# Patient Record
Sex: Female | Born: 1955
Health system: Southern US, Community
[De-identification: ages and names within clinical notes are randomized; demographics above are authoritative.]

## PROBLEM LIST (undated history)

## (undated) DIAGNOSIS — I63139 Cerebral infarction due to embolism of unspecified carotid artery: Secondary | ICD-10-CM

## (undated) DIAGNOSIS — I10 Essential (primary) hypertension: Secondary | ICD-10-CM

## (undated) DIAGNOSIS — K219 Gastro-esophageal reflux disease without esophagitis: Secondary | ICD-10-CM

## (undated) DIAGNOSIS — C50919 Malignant neoplasm of unspecified site of unspecified female breast: Secondary | ICD-10-CM

## (undated) DIAGNOSIS — R011 Cardiac murmur, unspecified: Secondary | ICD-10-CM

## (undated) DIAGNOSIS — K759 Inflammatory liver disease, unspecified: Secondary | ICD-10-CM

## (undated) DIAGNOSIS — Z9221 Personal history of antineoplastic chemotherapy: Secondary | ICD-10-CM

## (undated) DIAGNOSIS — Z923 Personal history of irradiation: Secondary | ICD-10-CM

## (undated) DIAGNOSIS — I639 Cerebral infarction, unspecified: Secondary | ICD-10-CM

## (undated) DIAGNOSIS — E785 Hyperlipidemia, unspecified: Secondary | ICD-10-CM

## (undated) DIAGNOSIS — I739 Peripheral vascular disease, unspecified: Secondary | ICD-10-CM

## (undated) DIAGNOSIS — H539 Unspecified visual disturbance: Secondary | ICD-10-CM

## (undated) DIAGNOSIS — E119 Type 2 diabetes mellitus without complications: Secondary | ICD-10-CM

## (undated) DIAGNOSIS — G4730 Sleep apnea, unspecified: Secondary | ICD-10-CM

## (undated) HISTORY — DX: Malignant neoplasm of unspecified site of unspecified female breast: C50.919

## (undated) HISTORY — DX: Unspecified visual disturbance: H53.9

## (undated) HISTORY — PX: COLONOSCOPY: SHX174

## (undated) HISTORY — PX: BREAST EXCISIONAL BIOPSY: SUR124

## (undated) HISTORY — PX: UTERINE FIBROID SURGERY: SHX826

---

## 2010-01-11 ENCOUNTER — Emergency Department (HOSPITAL_COMMUNITY)
Admission: EM | Admit: 2010-01-11 | Discharge: 2010-01-11 | Payer: Self-pay | Source: Home / Self Care | Admitting: Emergency Medicine

## 2011-10-28 ENCOUNTER — Encounter (HOSPITAL_COMMUNITY): Payer: Self-pay | Admitting: Emergency Medicine

## 2011-10-28 ENCOUNTER — Emergency Department (HOSPITAL_COMMUNITY): Payer: Self-pay

## 2011-10-28 ENCOUNTER — Emergency Department (HOSPITAL_COMMUNITY)
Admission: EM | Admit: 2011-10-28 | Discharge: 2011-10-28 | Disposition: A | Discharge status: Home / Self Care | Payer: Self-pay | Attending: Emergency Medicine | Admitting: Emergency Medicine

## 2011-10-28 DIAGNOSIS — R55 Syncope and collapse: Secondary | ICD-10-CM | POA: Insufficient documentation

## 2011-10-28 DIAGNOSIS — R42 Dizziness and giddiness: Secondary | ICD-10-CM | POA: Insufficient documentation

## 2011-10-28 DIAGNOSIS — I10 Essential (primary) hypertension: Secondary | ICD-10-CM | POA: Insufficient documentation

## 2011-10-28 HISTORY — DX: Essential (primary) hypertension: I10

## 2011-10-28 LAB — CBC WITH DIFFERENTIAL/PLATELET
Basophils Absolute: 0 10*3/uL (ref 0.0–0.1)
Basophils Relative: 0 % (ref 0–1)
Eosinophils Absolute: 0 10*3/uL (ref 0.0–0.7)
Eosinophils Relative: 0 % (ref 0–5)
Lymphs Abs: 1 10*3/uL (ref 0.7–4.0)
MCH: 29.8 pg (ref 26.0–34.0)
MCV: 86 fL (ref 78.0–100.0)
Neutrophils Relative %: 76 % (ref 43–77)
Platelets: 303 10*3/uL (ref 150–400)
RBC: 4.43 MIL/uL (ref 3.87–5.11)
RDW: 12.2 % (ref 11.5–15.5)
WBC: 5.5 10*3/uL (ref 4.0–10.5)

## 2011-10-28 LAB — BASIC METABOLIC PANEL
Calcium: 10.2 mg/dL (ref 8.4–10.5)
GFR calc Af Amer: >90 mL/min (ref >90)
GFR calc non Af Amer: >90 mL/min (ref >90)
Glucose, Bld: 147 mg/dL — ABNORMAL HIGH (ref 70–99)
Potassium: 2.9 mEq/L — ABNORMAL LOW (ref 3.5–5.1)
Sodium: 138 mEq/L (ref 135–145)

## 2011-10-28 MED ORDER — SODIUM CHLORIDE 0.9 % IV SOLN
INTRAVENOUS | Status: DC
  Administered 2011-10-28: 13:00:00 via INTRAVENOUS

## 2011-10-28 MED ORDER — POTASSIUM CHLORIDE CRYS ER 20 MEQ PO TBCR
40.0000 meq | EXTENDED_RELEASE_TABLET | Freq: Once | ORAL | Status: AC
  Administered 2011-10-28: 40 meq via ORAL
  Filled 2011-10-28: qty 2

## 2011-10-28 MED ORDER — MECLIZINE HCL 25 MG PO TABS
25.0000 mg | ORAL_TABLET | Freq: Once | ORAL | Status: AC
  Administered 2011-10-28: 25 mg via ORAL
  Filled 2011-10-28: qty 1

## 2011-10-28 MED ORDER — MECLIZINE HCL 25 MG PO TABS: 25.0000 mg | ORAL_TABLET | Freq: Four times a day (QID) | ORAL | Status: DC

## 2011-10-28 NOTE — ED Notes (Signed)
Pt presenting to ed with c/o dizziness, and headache pain. Pt denies nausea and vomiting pt states she feels like her balance is off. Pt states she feels like she's about to pass out. Pt denies chest pain at this time.

## 2011-10-28 NOTE — ED Notes (Addendum)
Wrong chart

## 2011-10-28 NOTE — ED Notes (Signed)
Pt in MRI.

## 2011-10-28 NOTE — ED Provider Notes (Addendum)
History     CSN: [id number]  Arrival date & time 10/28/11  1027   First MD Initiated Contact with Patient 10/28/11 1119      Chief Complaint  Patient presents with  . Dizziness  . Near Syncope    (Consider location/radiation/quality/duration/timing/severity/associated sxs/prior treatment) The history is provided by the patient.   patient here with dizziness worse with standing x1 day. She denies any headache or blurred vision. Denies any chest pain chest pressure. No recent black or bloody stools. Denies abdominal pain. No vertiginous component to this. Did have her blood pressure medications changed recently. Symptoms worse with standing and better with remaining still  Past Medical History  Diagnosis Date  . Hypertension     Past Surgical History  Procedure Date  . Uterine fibroid surgery     No family history on file.  History  Substance Use Topics  . Smoking status: Never Smoker   . Smokeless tobacco: Not on file  . Alcohol Use: No    OB History    Grav Para Term Preterm Abortions TAB SAB Ect Mult Living                  Review of Systems  All other systems reviewed and are negative.    Allergies  Review of patient's allergies indicates no known allergies.  Home Medications   Current Outpatient Rx  Name Route Sig Dispense Refill  . AMLODIPINE BESYLATE 10 MG PO TABS Oral Take 10 mg by mouth daily.    . ASPIRIN 81 MG PO TABS Oral Take 81 mg by mouth daily.    Marland Kitchen HYDROCHLOROTHIAZIDE 50 MG PO TABS Oral Take 50 mg by mouth daily.      BP 172/72  Pulse 88  Temp 98.4 F (36.9 C) (Oral)  SpO2 96%  Physical Exam  Nursing note and vitals reviewed. Constitutional: She is oriented to person, place, and time. She appears well-developed and well-nourished.  Non-toxic appearance. No distress.  HENT:  Head: Normocephalic and atraumatic.  Eyes: Conjunctivae normal, EOM and lids are normal. Pupils are equal, round, and reactive to light.  Neck: Normal range  of motion. Neck supple. No tracheal deviation present. No mass present.  Cardiovascular: Normal rate, regular rhythm and normal heart sounds.  Exam reveals no gallop.   No murmur heard. Pulmonary/Chest: Effort normal and breath sounds normal. No stridor. No respiratory distress. She has no decreased breath sounds. She has no wheezes. She has no rhonchi. She has no rales.  Abdominal: Soft. Normal appearance and bowel sounds are normal. She exhibits no distension. There is no tenderness. There is no rebound and no CVA tenderness.  Musculoskeletal: Normal range of motion. She exhibits no edema and no tenderness.  Neurological: She is alert and oriented to person, place, and time. She has normal strength. No cranial nerve deficit or sensory deficit. GCS eye subscore is 4. GCS verbal subscore is 5. GCS motor subscore is 6.  Skin: Skin is warm and dry. No abrasion and no rash noted.  Psychiatric: She has a normal mood and affect. Her speech is normal and behavior is normal.    ED Course  Procedures (including critical care time)   Labs Reviewed  CBC WITH DIFFERENTIAL  BASIC METABOLIC PANEL   No results found.   No diagnosis found.    MDM   Date: 10/27/2011  Rate: 72  Rhythm: normal sinus rhythm  QRS Axis: normal  Intervals: normal  ST/T Wave abnormalities: nonspecific T wave changes  Conduction Disutrbances:none  Narrative Interpretation:   Old EKG Reviewed: none available  2:09 PM Patient symptoms right now concerning for possible central cause of her vertigo. Patient orthostatic. Will order MRI of her brain. States that when she walks. Balance and leaning to one side.   3:36 PM Pt with pending mri--signed out to dr. Herbert Deaner, MD 10/28/11 1409  Toy Baker, MD 10/28/11 1537

## 2012-08-08 ENCOUNTER — Encounter (HOSPITAL_COMMUNITY): Payer: Self-pay | Admitting: Emergency Medicine

## 2012-08-08 ENCOUNTER — Emergency Department (INDEPENDENT_AMBULATORY_CARE_PROVIDER_SITE_OTHER)
Admission: EM | Admit: 2012-08-08 | Discharge: 2012-08-08 | Disposition: A | Discharge status: Home / Self Care | Payer: Self-pay | Source: Home / Self Care | Attending: Emergency Medicine | Admitting: Emergency Medicine

## 2012-08-08 DIAGNOSIS — W57XXXA Bitten or stung by nonvenomous insect and other nonvenomous arthropods, initial encounter: Secondary | ICD-10-CM

## 2012-08-08 DIAGNOSIS — L0390 Cellulitis, unspecified: Secondary | ICD-10-CM

## 2012-08-08 DIAGNOSIS — L089 Local infection of the skin and subcutaneous tissue, unspecified: Secondary | ICD-10-CM

## 2012-08-08 DIAGNOSIS — L0291 Cutaneous abscess, unspecified: Secondary | ICD-10-CM

## 2012-08-08 MED ORDER — SULFAMETHOXAZOLE-TRIMETHOPRIM 800-160 MG PO TABS: 1.0000 | ORAL_TABLET | Freq: Two times a day (BID) | ORAL | Status: DC

## 2012-08-08 MED ORDER — CEPHALEXIN 500 MG PO CAPS: 500.0000 mg | ORAL_CAPSULE | Freq: Four times a day (QID) | ORAL | Status: DC

## 2012-08-08 NOTE — ED Provider Notes (Signed)
Medical screening examination/treatment/procedure(s) were performed by non-physician practitioner and as supervising physician I was immediately available for consultation/collaboration.  Leslee Home, M.D.  Reuben Likes, MD 08/08/12 [phone]

## 2012-08-08 NOTE — ED Notes (Signed)
Complaining insect bite on left arm and back of neck which happened Wednesday evening.  Patient states the bite was itching so she has been scratching.  Anti itch cream and gold bond used but no relief.

## 2012-08-08 NOTE — ED Provider Notes (Signed)
CSN: [id number]     Arrival date & time 08/08/12  1522 History     First MD Initiated Contact with Patient 08/08/12 1613     Chief Complaint  Patient presents with  . Insect Bite   (Consider location/radiation/quality/duration/timing/severity/associated sxs/prior Treatment) HPI Comments: Pt noticed insect bites on back of neck and L forearm on 7/23, not sure of kind of insect.  Both areas have become swollen.  L forearm is much more swollen, red, hot to touch.   Patient is a 57 y.o. female presenting with rash. The history is provided by the patient.  Rash Pain quality: aching   Pain severity:  Moderate Onset quality:  Gradual Timing:  Constant Progression:  Worsening Chronicity:  New Context comment:  Insect bite Relieved by:  Nothing Ineffective treatments: anti itch cream and gold bond. Associated symptoms: no chills and no fever     Past Medical History  Diagnosis Date  . Hypertension    Past Surgical History  Procedure Laterality Date  . Uterine fibroid surgery     History reviewed. No pertinent family history. History  Substance Use Topics  . Smoking status: Never Smoker   . Smokeless tobacco: Not on file  . Alcohol Use: No   OB History   Grav Para Term Preterm Abortions TAB SAB Ect Mult Living                 Review of Systems  Constitutional: Negative for fever and chills.  Skin: Positive for rash.    Allergies  Review of patient's allergies indicates no known allergies.  Home Medications   Current Outpatient Rx  Name  Route  Sig  Dispense  Refill  . amLODipine (NORVASC) 10 MG tablet   Oral   Take 10 mg by mouth daily.         Marland Kitchen aspirin 81 MG tablet   Oral   Take 81 mg by mouth daily.         . hydrochlorothiazide (HYDRODIURIL) 50 MG tablet   Oral   Take 50 mg by mouth daily.         . cephALEXin (KEFLEX) 500 MG capsule   Oral   Take 1 capsule (500 mg total) by mouth 4 (four) times daily.   40 capsule   0   . meclizine  (ANTIVERT) 25 MG tablet   Oral   Take 1 tablet (25 mg total) by mouth 4 (four) times daily.   28 tablet   0   . sulfamethoxazole-trimethoprim (SEPTRA DS) 800-160 MG per tablet   Oral   Take 1 tablet by mouth every 12 (twelve) hours.   20 tablet   0    BP 162/86  Pulse 89  Temp(Src) 98.1 F (36.7 C) (Oral)  Resp 20  SpO2 98% Physical Exam  Constitutional: She appears well-developed and well-nourished. No distress.  Pulmonary/Chest: Effort normal.  Skin: Skin is warm, dry and intact. Rash noted. There is erythema.       ED Course   Procedures (including critical care time)  Labs Reviewed - No data to display No results found. 1. Cellulitis   2. Insect bite of arm, left, infected, initial encounter   3. Insect bite     MDM  rx for bactrim and keflex given for 10 days each.  Pt to return here for recheck on Sunday 7/27 unless sx get significantly worse in which case pt is to go to ER.   Cathlyn Parsons, NP 08/08/12 [phone]

## 2014-08-16 ENCOUNTER — Emergency Department (HOSPITAL_COMMUNITY)
Admission: EM | Admit: 2014-08-16 | Discharge: 2014-08-16 | Disposition: A | Discharge status: Home / Self Care | Payer: No Typology Code available for payment source | Source: Home / Self Care | Attending: Family Medicine | Admitting: Family Medicine

## 2014-08-16 ENCOUNTER — Encounter (HOSPITAL_COMMUNITY): Payer: Self-pay | Admitting: Emergency Medicine

## 2014-08-16 DIAGNOSIS — W57XXXA Bitten or stung by nonvenomous insect and other nonvenomous arthropods, initial encounter: Secondary | ICD-10-CM

## 2014-08-16 DIAGNOSIS — S60861A Insect bite (nonvenomous) of right wrist, initial encounter: Secondary | ICD-10-CM

## 2014-08-16 MED ORDER — BETAMETHASONE DIPROPIONATE 0.05 % EX CREA: TOPICAL_CREAM | Freq: Two times a day (BID) | CUTANEOUS | Status: DC

## 2014-08-16 NOTE — ED Provider Notes (Signed)
CSN: [id number]     Arrival date & time 08/16/14  1308 History   First MD Initiated Contact with Patient 08/16/14 1507     Chief Complaint  Patient presents with  . Rash  . Insect Bite   (Consider location/radiation/quality/duration/timing/severity/associated sxs/prior Treatment) Patient is a 59 y.o. female presenting with rash. The history is provided by the patient.  Rash Location:  Shoulder/arm Shoulder/arm rash location:  R forearm Quality: blistering, itchiness and swelling   Severity:  Mild Onset quality:  Gradual Duration:  2 days Progression:  Unchanged Chronicity:  New Context: insect bite/sting   Relieved by:  None tried Worsened by:  Nothing tried Associated symptoms: no fever     Past Medical History  Diagnosis Date  . Hypertension    Past Surgical History  Procedure Laterality Date  . Uterine fibroid surgery     No family history on file. History  Substance Use Topics  . Smoking status: Never Smoker   . Smokeless tobacco: Not on file  . Alcohol Use: No   OB History    No data available     Review of Systems  Constitutional: Negative.  Negative for fever.  Skin: Positive for rash.    Allergies  Review of patient's allergies indicates no known allergies.  Home Medications   Prior to Admission medications   Medication Sig Start Date End Date Taking? Authorizing Provider  amLODipine (NORVASC) 10 MG tablet Take 10 mg by mouth daily.   Yes Historical Provider, MD  aspirin 81 MG tablet Take 81 mg by mouth daily.   Yes Historical Provider, MD  hydrochlorothiazide (HYDRODIURIL) 50 MG tablet Take 50 mg by mouth daily.   Yes Historical Provider, MD  pravastatin (PRAVACHOL) 20 MG tablet Take 20 mg by mouth daily.   Yes Historical Provider, MD  cephALEXin (KEFLEX) 500 MG capsule Take 1 capsule (500 mg total) by mouth 4 (four) times daily. 08/08/12   Carvel Getting, NP  meclizine (ANTIVERT) 25 MG tablet Take 1 tablet (25 mg total) by mouth 4 (four) times  daily. 10/28/11   Lacretia Leigh, MD  sulfamethoxazole-trimethoprim (SEPTRA DS) 800-160 MG per tablet Take 1 tablet by mouth every 12 (twelve) hours. 08/08/12   Carvel Getting, NP   BP 158/63 mmHg  Pulse 74  Temp(Src) 98.3 F (36.8 C) (Oral)  Resp 16  SpO2 98% Physical Exam  Constitutional: She is oriented to person, place, and time. She appears well-developed and well-nourished.  Musculoskeletal: She exhibits no tenderness.  Neurological: She is alert and oriented to person, place, and time.  Skin: Skin is warm and dry. Rash noted. There is erythema.  Nursing note and vitals reviewed.   ED Course  Procedures (including critical care time) Labs Review Labs Reviewed - No data to display  Imaging Review No results found.   MDM  No diagnosis found.     Billy Fischer, MD 08/16/14 [phone]

## 2014-08-16 NOTE — ED Notes (Signed)
Reports poss insect bite to right forearm onset Saturday and now has developed a rash Did not see or feel what bit her  Denies fevers, chills... Alert, no signs of acute distress.

## 2015-08-31 ENCOUNTER — Ambulatory Visit
Admission: RE | Admit: 2015-08-31 | Discharge: 2015-08-31 | Disposition: A | Discharge status: Home / Self Care | Payer: BLUE CROSS/BLUE SHIELD | Source: Ambulatory Visit | Attending: Family Medicine | Admitting: Family Medicine

## 2015-08-31 ENCOUNTER — Other Ambulatory Visit: Payer: Self-pay | Admitting: Family Medicine

## 2015-08-31 DIAGNOSIS — M79672 Pain in left foot: Secondary | ICD-10-CM

## 2016-05-07 ENCOUNTER — Other Ambulatory Visit: Payer: Self-pay | Admitting: Family Medicine

## 2016-05-07 DIAGNOSIS — R102 Pelvic and perineal pain: Secondary | ICD-10-CM

## 2016-05-08 ENCOUNTER — Ambulatory Visit
Admission: RE | Admit: 2016-05-08 | Discharge: 2016-05-08 | Disposition: A | Discharge status: Home / Self Care | Payer: BLUE CROSS/BLUE SHIELD | Source: Ambulatory Visit | Attending: Family Medicine | Admitting: Family Medicine

## 2016-05-08 DIAGNOSIS — R102 Pelvic and perineal pain: Secondary | ICD-10-CM

## 2017-07-05 ENCOUNTER — Emergency Department (HOSPITAL_COMMUNITY): Payer: Medicaid Other

## 2017-07-05 ENCOUNTER — Encounter (HOSPITAL_COMMUNITY): Payer: Self-pay | Admitting: Radiology

## 2017-07-05 ENCOUNTER — Inpatient Hospital Stay (HOSPITAL_COMMUNITY)
Admission: EM | Admit: 2017-07-05 | Discharge: 2017-07-13 | DRG: 871 | Disposition: A | Discharge status: Home Health | Payer: Medicaid Other | Attending: Internal Medicine | Admitting: Internal Medicine

## 2017-07-05 DIAGNOSIS — R6520 Severe sepsis without septic shock: Secondary | ICD-10-CM | POA: Diagnosis present

## 2017-07-05 DIAGNOSIS — E785 Hyperlipidemia, unspecified: Secondary | ICD-10-CM | POA: Diagnosis present

## 2017-07-05 DIAGNOSIS — R29702 NIHSS score 2: Secondary | ICD-10-CM | POA: Diagnosis not present

## 2017-07-05 DIAGNOSIS — I6330 Cerebral infarction due to thrombosis of unspecified cerebral artery: Secondary | ICD-10-CM

## 2017-07-05 DIAGNOSIS — E860 Dehydration: Secondary | ICD-10-CM | POA: Diagnosis present

## 2017-07-05 DIAGNOSIS — K047 Periapical abscess without sinus: Secondary | ICD-10-CM | POA: Diagnosis present

## 2017-07-05 DIAGNOSIS — G9341 Metabolic encephalopathy: Secondary | ICD-10-CM | POA: Diagnosis present

## 2017-07-05 DIAGNOSIS — E870 Hyperosmolality and hypernatremia: Secondary | ICD-10-CM | POA: Diagnosis present

## 2017-07-05 DIAGNOSIS — E872 Acidosis, unspecified: Secondary | ICD-10-CM

## 2017-07-05 DIAGNOSIS — Z452 Encounter for adjustment and management of vascular access device: Secondary | ICD-10-CM

## 2017-07-05 DIAGNOSIS — G8321 Monoplegia of upper limb affecting right dominant side: Secondary | ICD-10-CM | POA: Diagnosis not present

## 2017-07-05 DIAGNOSIS — E1122 Type 2 diabetes mellitus with diabetic chronic kidney disease: Secondary | ICD-10-CM | POA: Diagnosis present

## 2017-07-05 DIAGNOSIS — Z833 Family history of diabetes mellitus: Secondary | ICD-10-CM

## 2017-07-05 DIAGNOSIS — I63412 Cerebral infarction due to embolism of left middle cerebral artery: Secondary | ICD-10-CM | POA: Diagnosis not present

## 2017-07-05 DIAGNOSIS — E1101 Type 2 diabetes mellitus with hyperosmolarity with coma: Secondary | ICD-10-CM | POA: Diagnosis present

## 2017-07-05 DIAGNOSIS — E1110 Type 2 diabetes mellitus with ketoacidosis without coma: Secondary | ICD-10-CM | POA: Diagnosis present

## 2017-07-05 DIAGNOSIS — D696 Thrombocytopenia, unspecified: Secondary | ICD-10-CM | POA: Diagnosis present

## 2017-07-05 DIAGNOSIS — N182 Chronic kidney disease, stage 2 (mild): Secondary | ICD-10-CM | POA: Diagnosis present

## 2017-07-05 DIAGNOSIS — K0530 Chronic periodontitis, unspecified: Secondary | ICD-10-CM | POA: Diagnosis present

## 2017-07-05 DIAGNOSIS — A419 Sepsis, unspecified organism: Principal | ICD-10-CM | POA: Diagnosis present

## 2017-07-05 DIAGNOSIS — E1111 Type 2 diabetes mellitus with ketoacidosis with coma: Secondary | ICD-10-CM | POA: Diagnosis present

## 2017-07-05 DIAGNOSIS — R9431 Abnormal electrocardiogram [ECG] [EKG]: Secondary | ICD-10-CM

## 2017-07-05 DIAGNOSIS — I6522 Occlusion and stenosis of left carotid artery: Secondary | ICD-10-CM | POA: Diagnosis present

## 2017-07-05 DIAGNOSIS — E876 Hypokalemia: Secondary | ICD-10-CM

## 2017-07-05 DIAGNOSIS — R29810 Facial weakness: Secondary | ICD-10-CM | POA: Diagnosis not present

## 2017-07-05 DIAGNOSIS — Z823 Family history of stroke: Secondary | ICD-10-CM

## 2017-07-05 DIAGNOSIS — E1011 Type 1 diabetes mellitus with ketoacidosis with coma: Secondary | ICD-10-CM

## 2017-07-05 DIAGNOSIS — N179 Acute kidney failure, unspecified: Secondary | ICD-10-CM | POA: Diagnosis present

## 2017-07-05 DIAGNOSIS — E878 Other disorders of electrolyte and fluid balance, not elsewhere classified: Secondary | ICD-10-CM | POA: Diagnosis present

## 2017-07-05 DIAGNOSIS — E861 Hypovolemia: Secondary | ICD-10-CM | POA: Diagnosis present

## 2017-07-05 DIAGNOSIS — I129 Hypertensive chronic kidney disease with stage 1 through stage 4 chronic kidney disease, or unspecified chronic kidney disease: Secondary | ICD-10-CM | POA: Diagnosis present

## 2017-07-05 DIAGNOSIS — Z9181 History of falling: Secondary | ICD-10-CM

## 2017-07-05 DIAGNOSIS — K0401 Reversible pulpitis: Secondary | ICD-10-CM

## 2017-07-05 DIAGNOSIS — Z7982 Long term (current) use of aspirin: Secondary | ICD-10-CM

## 2017-07-05 LAB — CBC WITH DIFFERENTIAL/PLATELET
ABS IMMATURE GRANULOCYTES: 0.3 10*3/uL — AB (ref 0.0–0.1)
Basophils Absolute: 0.1 10*3/uL (ref 0.0–0.1)
Basophils Relative: 0 %
EOS ABS: 0 10*3/uL (ref 0.0–0.7)
Eosinophils Relative: 0 %
HEMATOCRIT: 64.5 % — AB (ref 36.0–46.0)
Hemoglobin: 18.4 g/dL — ABNORMAL HIGH (ref 12.0–15.0)
IMMATURE GRANULOCYTES: 2 %
LYMPHS ABS: 1 10*3/uL (ref 0.7–4.0)
Lymphocytes Relative: 6 %
MCH: 28.5 pg (ref 26.0–34.0)
MCHC: 28.5 g/dL — ABNORMAL LOW (ref 30.0–36.0)
MCV: 100 fL (ref 78.0–100.0)
MONOS PCT: 2 %
Monocytes Absolute: 0.4 10*3/uL (ref 0.1–1.0)
NEUTROS ABS: 16.3 10*3/uL — AB (ref 1.7–7.7)
Neutrophils Relative %: 90 %
Platelets: 329 10*3/uL (ref 150–400)
RBC: 6.45 MIL/uL — AB (ref 3.87–5.11)
RDW: 13.8 % (ref 11.5–15.5)
WBC: 18.2 10*3/uL — AB (ref 4.0–10.5)

## 2017-07-05 LAB — I-STAT CHEM 8, ED
BUN: 92 mg/dL — ABNORMAL HIGH (ref 6–20)
CALCIUM ION: 1.28 mmol/L (ref 1.15–1.40)
CHLORIDE: 114 mmol/L — AB (ref 101–111)
Creatinine, Ser: 4.6 mg/dL — ABNORMAL HIGH (ref 0.44–1.00)
HCT: 58 % — ABNORMAL HIGH (ref 36.0–46.0)
Hemoglobin: 19.7 g/dL — ABNORMAL HIGH (ref 12.0–15.0)
Potassium: 3.2 mmol/L — ABNORMAL LOW (ref 3.5–5.1)
SODIUM: 149 mmol/L — AB (ref 135–145)
TCO2: 11 mmol/L — AB (ref 22–32)

## 2017-07-05 LAB — SALICYLATE LEVEL: Salicylate Lvl: <7 mg/dL (ref 2.8–30.0)

## 2017-07-05 LAB — I-STAT VENOUS BLOOD GAS, ED
Acid-base deficit: 19 mmol/L — ABNORMAL HIGH (ref 0.0–2.0)
Bicarbonate: 10.2 mmol/L — ABNORMAL LOW (ref 20.0–28.0)
O2 SAT: 54 %
PCO2 VEN: 35.9 mmHg — AB (ref 44.0–60.0)
PO2 VEN: 40 mmHg (ref 32.0–45.0)
TCO2: 11 mmol/L — AB (ref 22–32)
pH, Ven: 7.061 — CL (ref 7.250–7.430)

## 2017-07-05 LAB — PROTIME-INR
INR: 1.23
Prothrombin Time: 15.4 seconds — ABNORMAL HIGH (ref 11.4–15.2)

## 2017-07-05 LAB — I-STAT CG4 LACTIC ACID, ED: LACTIC ACID, VENOUS: 10.19 mmol/L — AB (ref 0.5–1.9)

## 2017-07-05 LAB — CBG MONITORING, ED

## 2017-07-05 LAB — ETHANOL: Alcohol, Ethyl (B): <10 mg/dL (ref <10)

## 2017-07-05 LAB — ACETAMINOPHEN LEVEL: Acetaminophen (Tylenol), Serum: <10 ug/mL — ABNORMAL LOW (ref 10–30)

## 2017-07-05 MED ORDER — SODIUM CHLORIDE 0.9 % IV BOLUS
1000.0000 mL | Freq: Once | INTRAVENOUS | Status: AC
  Administered 2017-07-05: 1000 mL via INTRAVENOUS

## 2017-07-05 MED ORDER — SODIUM CHLORIDE 0.9 % IV SOLN
INTRAVENOUS | Status: DC
  Administered 2017-07-05: 5.4 [IU]/h via INTRAVENOUS
  Administered 2017-07-06: 16.6 [IU]/h via INTRAVENOUS
  Filled 2017-07-05 (×3): qty 1

## 2017-07-05 MED ORDER — SODIUM CHLORIDE 0.9 % IV SOLN
INTRAVENOUS | Status: DC
  Administered 2017-07-05: 23:00:00 via INTRAVENOUS

## 2017-07-05 MED ORDER — DEXTROSE-NACL 5-0.45 % IV SOLN
INTRAVENOUS | Status: DC
  Administered 2017-07-06 – 2017-07-08 (×7): via INTRAVENOUS

## 2017-07-05 MED ORDER — MAGNESIUM SULFATE IN D5W 1-5 GM/100ML-% IV SOLN
1.0000 g | Freq: Once | INTRAVENOUS | Status: AC
  Administered 2017-07-06: 1 g via INTRAVENOUS
  Filled 2017-07-05: qty 100

## 2017-07-05 MED ORDER — NALOXONE HCL 4 MG/0.1ML NA LIQD
1.0000 | Freq: Once | NASAL | Status: AC
  Administered 2017-07-05: 1 via NASAL
  Filled 2017-07-05: qty 4

## 2017-07-05 MED ORDER — POTASSIUM CHLORIDE 10 MEQ/100ML IV SOLN
10.0000 meq | INTRAVENOUS | Status: AC
  Administered 2017-07-05 – 2017-07-06 (×4): 10 meq via INTRAVENOUS
  Filled 2017-07-05 (×4): qty 100

## 2017-07-05 NOTE — ED Notes (Addendum)
Pt found in bed with a fan pointed toward her by family, she was not answering the phone today per family. LSW 1000 today. Recent dental procedure but unknown if she is taking medicines. EMS reports hx HTN. Pt responsive to painful stimuli upon arrival to ED, fruity odor to breath, mucous membranes dry. Cool to the touch.

## 2017-07-05 NOTE — ED Notes (Signed)
Escorted to CT. Pt remains alerted, moving around in bed asking to get up.

## 2017-07-05 NOTE — ED Provider Notes (Signed)
The patient is a 62 year old female, there is not much history as the patient arrives obtunded, she apparently has had altered mental status today, she has severely dry mucous membranes, she is able to follow commands barely but has difficulty opening her eyes, has difficulty speaking at all, she is tachycardic and hypotensive with a blood pressure of 76 and a blood sugar which is too high to read over 600.  On exam the patient has no vascular access peripherally and an ultrasound IV was required.  She has no edema, soft abdomen, no pulsating masses, clear lung sounds, the patient is tachypneic.  The patient is critically ill, she is hyperventilating with shallow respirations but adequately oxygenating.  Her IV access was very difficult, required ultrasound-guided IVs in the bilateral antecubital areas.  She has a mild tachycardia, she is hypotensive requiring multiple fluid boluses.  She will likely need to go to a high level of care if not the intensive care unit.  Labs pending at this time.  10:47 PM   Labs are significantly abnormal, she is acidotic, she has a significant lactic acidosis as well as a ketoacidosis, blood sugars over 1200, the patient is received multiple fluid boluses, blood pressure is improving, her mental status is also improving, I discussed the care with her family at the bedside, she will need to be admitted to the hospital to a high level of care on an insulin drip.    .Critical Care Performed by: Noemi Chapel, MD Authorized by: Noemi Chapel, MD   Critical care provider statement:    Critical care time (minutes):  35   Critical care time was exclusive of:  Separately billable procedures and treating other patients and teaching time   Critical care was necessary to treat or prevent imminent or life-threatening deterioration of the following conditions:  Endocrine crisis and shock   Critical care was time spent personally by me on the following activities:  Blood draw  for specimens, development of treatment plan with patient or surrogate, discussions with consultants, evaluation of patient's response to treatment, examination of patient, obtaining history from patient or surrogate, ordering and performing treatments and interventions, ordering and review of laboratory studies, ordering and review of radiographic studies, pulse oximetry, re-evaluation of patient's condition and review of old charts   I saw and evaluated the patient, reviewed the resident's note and I agree with the findings and plan.   EKG Interpretation  Date/Time:  Friday July 05 2017 23:13:16 EDT Ventricular Rate:  89 PR Interval:    QRS Duration: 84 QT Interval:  424 QTC Calculation: 516 R Axis:   63 Text Interpretation:  Sinus rhythm RSR' in V1 or V2, right VCD or RVH Repol abnrm suggests ischemia, diffuse leads Prolonged QT interval Since last tracing QT has lengthened Abnormal ekg Confirmed by Noemi Chapel [id number] on 07/06/2017 12:10:28 AM        I personally interpreted the EKG as well as the resident and agree with the interpretation on the resident's chart.  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

## 2017-07-05 NOTE — ED Provider Notes (Signed)
[facility] Provider Note   CSN: [id number] Arrival date & time: 07/05/17  2210  History   Chief Complaint Chief Complaint  Patient presents with  . Altered Mental Status  . Hypotension    HPI Sandra Shelton is a 62 y.o. female.  Not responding to family members calls all day. Talked to a sister in [place] over the phone just a couple hours prior to arrival, and told sister that she didn't feel okay and could not get out of bed. Police were called to patient's home, where she was found obtunded. Family reports recent fatigue, weight loss, polydipsia. No h/o DM. BG per EMS read "high."  The history is provided by the EMS personnel and a relative. The history is limited by the condition of the patient.  Altered Mental Status   This is a new problem. Episode onset: talked to family on phone 2 hours prior to arrival, last seen normal a few days ago. The problem has been gradually worsening. Associated symptoms include confusion, unresponsiveness and weakness. Her past medical history is significant for hypertension.    Past Medical History:  Diagnosis Date  . Hypertension     Patient Active Problem List   Diagnosis Date Noted  . DKA, type 2 ([place]) 07/06/2017    Past Surgical History:  Procedure Laterality Date  . UTERINE FIBROID SURGERY       OB History   None      Home Medications    Prior to Admission medications   Medication Sig Start Date End Date Taking? Authorizing Provider  amLODipine (NORVASC) 10 MG tablet Take 10 mg by mouth daily.    [provider]  aspirin 81 MG tablet Take 81 mg by mouth daily.    [provider]  betamethasone dipropionate (DIPROLENE) 0.05 % cream Apply topically 2 (two) times daily. 08/16/14   Billy Fischer, MD  cephALEXin (KEFLEX) 500 MG capsule Take 1 capsule (500 mg total) by mouth 4 (four) times daily. 08/08/12   Carvel Getting, NP  hydrochlorothiazide (HYDRODIURIL) 50 MG  tablet Take 50 mg by mouth daily.    [provider]  meclizine (ANTIVERT) 25 MG tablet Take 1 tablet (25 mg total) by mouth 4 (four) times daily. 10/28/11   Lacretia Leigh, MD  pravastatin (PRAVACHOL) 20 MG tablet Take 20 mg by mouth daily.    [provider]  sulfamethoxazole-trimethoprim (SEPTRA DS) 800-160 MG per tablet Take 1 tablet by mouth every 12 (twelve) hours. 08/08/12   Carvel Getting, NP    Family History Family History  Problem Relation Age of Onset  . Diabetes Mellitus II Sister     Social History Social History   Tobacco Use  . Smoking status: Never Smoker  . Smokeless tobacco: Never Used  Substance Use Topics  . Alcohol use: No  . Drug use: No     Allergies   Patient has no known allergies.   Review of Systems Review of Systems  Unable to perform ROS: Mental status change  Neurological: Positive for weakness.  Psychiatric/Behavioral: Positive for confusion.     Physical Exam Updated Vital Signs BP (!) 129/59   Pulse 97   Temp 98.5 F (36.9 C) (Rectal)   Resp (!) 30   Ht 5\' 6"  (1.676 m)   Wt 77.1 kg (170 lb)   SpO2 100%   BMI 27.44 kg/m   Physical Exam  Constitutional: She appears well-developed. She appears lethargic. She is easily aroused.  She appears ill. No distress.  HENT:  Head: Normocephalic and atraumatic.  Mouth/Throat: Mucous membranes are dry.  Eyes: Conjunctivae and EOM are normal.  Neck: Neck supple.  Cardiovascular: Regular rhythm and intact distal pulses. Tachycardia present.  No murmur heard. Pulmonary/Chest: Breath sounds normal. Tachypnea noted. No respiratory distress.  Abdominal: Soft. She exhibits no distension and no mass.  Musculoskeletal: She exhibits no edema.  Neurological: She is easily aroused. She appears lethargic. GCS eye subscore is 3. GCS verbal subscore is 2. GCS motor subscore is 6.  Skin: Skin is warm and dry.  Nursing note and vitals reviewed.    ED Treatments / Results   Labs (all labs ordered are listed, but only abnormal results are displayed) Labs Reviewed  CBC WITH DIFFERENTIAL/PLATELET - Abnormal; Notable for the following components:      Result Value   WBC 18.2 (*)    RBC 6.45 (*)    Hemoglobin 18.4 (*)    HCT 64.5 (*)    MCHC 28.5 (*)    Neutro Abs 16.3 (*)    Abs Immature Granulocytes 0.3 (*)    All other components within normal limits  COMPREHENSIVE METABOLIC PANEL - Abnormal; Notable for the following components:   Sodium 149 (*)    Potassium 3.2 (*)    CO2 10 (*)    Glucose, Bld 1,265 (*)    BUN 103 (*)    Creatinine, Ser 5.31 (*)    Calcium 11.3 (*)    Total Bilirubin 1.4 (*)    GFR calc non Af Amer 8 (*)    GFR calc Af Amer 9 (*)    Anion gap 37 (*)    All other components within normal limits  ACETAMINOPHEN LEVEL - Abnormal; Notable for the following components:   Acetaminophen (Tylenol), Serum <10 (*)    All other components within normal limits  PROTIME-INR - Abnormal; Notable for the following components:   Prothrombin Time 15.4 (*)    All other components within normal limits  RAPID URINE DRUG SCREEN, HOSP PERFORMED - Abnormal; Notable for the following components:   Barbiturates   (*)    Value: Result not available. Reagent lot number recalled by manufacturer.   All other components within normal limits  URINALYSIS, ROUTINE W REFLEX MICROSCOPIC - Abnormal; Notable for the following components:   APPearance CLOUDY (*)    Glucose, UA >=500 (*)    Hgb urine dipstick MODERATE (*)    Ketones, ur 20 (*)    Protein, ur 100 (*)    Bacteria, UA RARE (*)    Non Squamous Epithelial 0-5 (*)    All other components within normal limits  BETA-HYDROXYBUTYRIC ACID - Abnormal; Notable for the following components:   Beta-Hydroxybutyric Acid >8.00 (*)    All other components within normal limits  CBG MONITORING, ED - Abnormal; Notable for the following components:   Glucose-Capillary >600 (*)    All other components within  normal limits  I-STAT CHEM 8, ED - Abnormal; Notable for the following components:   Sodium 149 (*)    Potassium 3.2 (*)    Chloride 114 (*)    BUN 92 (*)    Creatinine, Ser 4.60 (*)    Glucose, Bld >700 (*)    TCO2 11 (*)    Hemoglobin 19.7 (*)    HCT 58.0 (*)    All other components within normal limits  I-STAT CG4 LACTIC ACID, ED - Abnormal; Notable for the following components:   Lactic  Acid, Venous 10.19 (*)    All other components within normal limits  I-STAT CG4 LACTIC ACID, ED - Abnormal; Notable for the following components:   Lactic Acid, Venous 8.01 (*)    All other components within normal limits  I-STAT VENOUS BLOOD GAS, ED - Abnormal; Notable for the following components:   pH, Ven 7.061 (*)    pCO2, Ven 35.9 (*)    Bicarbonate 10.2 (*)    TCO2 11 (*)    Acid-base deficit 19.0 (*)    All other components within normal limits  I-STAT CHEM 8, ED - Abnormal; Notable for the following components:   Sodium 154 (*)    Chloride 122 (*)    BUN 83 (*)    Creatinine, Ser 3.90 (*)    Glucose, Bld >700 (*)    TCO2 12 (*)    Hemoglobin 17.3 (*)    HCT 51.0 (*)    All other components within normal limits  CBG MONITORING, ED - Abnormal; Notable for the following components:   Glucose-Capillary >600 (*)    All other components within normal limits  CULTURE, BLOOD (ROUTINE X 2)  CULTURE, BLOOD (ROUTINE X 2)  URINE CULTURE  ETHANOL  SALICYLATE LEVEL  BLOOD GAS, VENOUS  TROPONIN I  TROPONIN I  TROPONIN I  BLOOD GAS, ARTERIAL  CBG MONITORING, ED  I-STAT CG4 LACTIC ACID, ED  I-STAT CG4 LACTIC ACID, ED  I-STAT CHEM 8, ED  CBG MONITORING, ED    EKG EKG Interpretation  Date/Time:  Friday July 05 2017 23:13:16 EDT Ventricular Rate:  89 PR Interval:    QRS Duration: 84 QT Interval:  424 QTC Calculation: 516 R Axis:   63 Text Interpretation:  Sinus rhythm RSR' in V1 or V2, right VCD or RVH Repol abnrm suggests ischemia, diffuse leads Prolonged QT interval Since  last tracing QT has lengthened Abnormal ekg Confirmed by Noemi Chapel [phone]) on 07/06/2017 12:10:28 AM   Radiology Ct Head Wo Contrast  Result Date: 07/06/2017 CLINICAL DATA:  Altered level of consciousness. Patient was found in bed. Responsive to painful stimuli. EXAM: CT HEAD WITHOUT CONTRAST TECHNIQUE: Contiguous axial images were obtained from the base of the skull through the vertex without intravenous contrast. COMPARISON:  MRI brain 10/28/2011 FINDINGS: Brain: Mild cerebral atrophy. No evidence of acute infarction, hemorrhage, hydrocephalus, extra-axial collection or mass lesion/mass effect. Vascular: Mild intracranial arterial vascular calcifications are present. Skull: Normal. Negative for fracture or focal lesion. Sinuses/Orbits: No acute finding. Other: None. IMPRESSION: No acute intracranial abnormalities.  Mild chronic atrophy. Electronically Signed   By: Lucienne Capers M.D.   On: 07/06/2017 00:48   Dg Chest Portable 1 View  Result Date: 07/05/2017 CLINICAL DATA:  62 year old female with altered mental status today. EXAM: PORTABLE CHEST 1 VIEW COMPARISON:  None. FINDINGS: Portable AP supine view at 2259 hours. Rotated to the right. Mildly low lung volumes. Allowing for portable technique the lungs are clear. Normal cardiac size and mediastinal contours. Visualized tracheal air column is within normal limits. No pneumothorax. Paucity of bowel gas in the upper abdomen. No acute osseous abnormality identified. IMPRESSION: No acute cardiopulmonary abnormality. Electronically Signed   By: Genevie Ann M.D.   On: 07/05/2017 23:35    Procedures Procedures (including critical care time)  Medications Ordered in ED Medications  0.9 %  sodium chloride infusion ( Intravenous New Bag/Given 07/05/17 2326)  dextrose 5 %-0.45 % sodium chloride infusion (has no administration in time range)  potassium chloride 10 mEq in  100 mL IVPB (10 mEq Intravenous New Bag/Given 07/06/17 0022)  insulin regular  (NOVOLIN R,HUMULIN R) 100 Units in sodium chloride 0.9 % 100 mL (1 Units/mL) infusion (10.8 Units/hr Intravenous Rate/Dose Change 07/06/17 0035)  sodium chloride 0.9 % bolus 1,000 mL (1,000 mLs Intravenous New Bag/Given 07/06/17 0021)  naloxone (NARCAN) nasal spray 4 mg/0.1 mL (1 spray Nasal Provided for home use 07/05/17 2255)  sodium chloride 0.9 % bolus 1,000 mL (0 mLs Intravenous Stopped 07/05/17 2329)  sodium chloride 0.9 % bolus 1,000 mL (0 mLs Intravenous Stopped 07/05/17 2330)  magnesium sulfate IVPB 1 g 100 mL (0 g Intravenous Stopped 07/06/17 0106)     Initial Impression / Assessment and Plan / ED Course  I have reviewed the triage vital signs and the nursing notes.  Pertinent labs & imaging results that were available during my care of the patient were reviewed by me and considered in my medical decision making (see chart for details).     Ticara Waner is a 62 y.o. female with PMHx of HTN who p/w AMS, found obtunded in home after family reports they could not get ahold of her all day. Blood glucose read "high." Reviewed and confirmed nursing documentation for past medical history, family history, social history. VS afebrile, tachypneic, BP 76/50. Exam remarkable for obtunded though MAE and following basic commands, dry mucous membranes, kussmaul breathing. Ddx concern for DKA. Possible CVA, septic shock of unclear source.   EKG with mild QT prolongation. BP improved with 2L NS. Lactic acidosis 10 -> 8 with fluid resuscitation. CMP with hyperglycemia 1265, hypokalemia 3.2, AKI with Cr 5.31, AG 37.  PH 7.061, bicarb 10. CBC with leukocytosis 18, hgb 18, likely hemoconcentrated given significant dehydration. UA with glucose, ketones. UDS neg. Alcohol, salicylate, APAP level neg. Blood, urine culture pending. CXR and CT head neg. Started on insulin infusion.   Old records reviewed. Labs reviewed by me and used in the medical decision making.  Imaging viewed and interpreted by me and used in the  medical decision making (formal interpretation from radiologist). EKG reviewed by me and used in the medical decision making. Admitted to hospitalist.    Final Clinical Impressions(s) / ED Diagnoses   Final diagnoses:  Diabetic ketoacidosis with coma associated with type 1 diabetes mellitus ([place])  Metabolic acidosis  Acute renal failure, unspecified acute renal failure type ([place])  Dehydration  Hypokalemia  Prolonged QT interval    ED Discharge Orders    None       Norm Salt, MD 07/06/17 4920    Noemi Chapel, MD 07/06/17 1801

## 2017-07-05 NOTE — ED Notes (Signed)
NARCAN AFFECTING BUT PT STILL LETHARGIC, PUPILS NOW 4MM

## 2017-07-06 ENCOUNTER — Inpatient Hospital Stay (HOSPITAL_COMMUNITY): Payer: Medicaid Other

## 2017-07-06 ENCOUNTER — Encounter (HOSPITAL_COMMUNITY): Payer: Self-pay | Admitting: Internal Medicine

## 2017-07-06 ENCOUNTER — Other Ambulatory Visit: Payer: Self-pay

## 2017-07-06 DIAGNOSIS — Z823 Family history of stroke: Secondary | ICD-10-CM | POA: Diagnosis not present

## 2017-07-06 DIAGNOSIS — K047 Periapical abscess without sinus: Secondary | ICD-10-CM | POA: Diagnosis present

## 2017-07-06 DIAGNOSIS — I63412 Cerebral infarction due to embolism of left middle cerebral artery: Secondary | ICD-10-CM | POA: Diagnosis not present

## 2017-07-06 DIAGNOSIS — Z833 Family history of diabetes mellitus: Secondary | ICD-10-CM | POA: Diagnosis not present

## 2017-07-06 DIAGNOSIS — K0530 Chronic periodontitis, unspecified: Secondary | ICD-10-CM | POA: Diagnosis present

## 2017-07-06 DIAGNOSIS — E878 Other disorders of electrolyte and fluid balance, not elsewhere classified: Secondary | ICD-10-CM | POA: Diagnosis present

## 2017-07-06 DIAGNOSIS — N179 Acute kidney failure, unspecified: Secondary | ICD-10-CM | POA: Diagnosis present

## 2017-07-06 DIAGNOSIS — E870 Hyperosmolality and hypernatremia: Secondary | ICD-10-CM | POA: Diagnosis present

## 2017-07-06 DIAGNOSIS — I5030 Unspecified diastolic (congestive) heart failure: Secondary | ICD-10-CM

## 2017-07-06 DIAGNOSIS — E1122 Type 2 diabetes mellitus with diabetic chronic kidney disease: Secondary | ICD-10-CM | POA: Diagnosis present

## 2017-07-06 DIAGNOSIS — E872 Acidosis, unspecified: Secondary | ICD-10-CM | POA: Insufficient documentation

## 2017-07-06 DIAGNOSIS — I129 Hypertensive chronic kidney disease with stage 1 through stage 4 chronic kidney disease, or unspecified chronic kidney disease: Secondary | ICD-10-CM | POA: Diagnosis present

## 2017-07-06 DIAGNOSIS — E860 Dehydration: Secondary | ICD-10-CM | POA: Insufficient documentation

## 2017-07-06 DIAGNOSIS — N182 Chronic kidney disease, stage 2 (mild): Secondary | ICD-10-CM | POA: Diagnosis present

## 2017-07-06 DIAGNOSIS — A419 Sepsis, unspecified organism: Secondary | ICD-10-CM | POA: Diagnosis present

## 2017-07-06 DIAGNOSIS — D696 Thrombocytopenia, unspecified: Secondary | ICD-10-CM | POA: Diagnosis present

## 2017-07-06 DIAGNOSIS — G8321 Monoplegia of upper limb affecting right dominant side: Secondary | ICD-10-CM | POA: Diagnosis not present

## 2017-07-06 DIAGNOSIS — E1110 Type 2 diabetes mellitus with ketoacidosis without coma: Secondary | ICD-10-CM | POA: Diagnosis present

## 2017-07-06 DIAGNOSIS — G9341 Metabolic encephalopathy: Secondary | ICD-10-CM | POA: Diagnosis present

## 2017-07-06 DIAGNOSIS — R29810 Facial weakness: Secondary | ICD-10-CM | POA: Diagnosis not present

## 2017-07-06 DIAGNOSIS — E1101 Type 2 diabetes mellitus with hyperosmolarity with coma: Secondary | ICD-10-CM | POA: Diagnosis present

## 2017-07-06 DIAGNOSIS — R29702 NIHSS score 2: Secondary | ICD-10-CM | POA: Diagnosis not present

## 2017-07-06 DIAGNOSIS — E876 Hypokalemia: Secondary | ICD-10-CM | POA: Diagnosis present

## 2017-07-06 DIAGNOSIS — Z7982 Long term (current) use of aspirin: Secondary | ICD-10-CM | POA: Diagnosis not present

## 2017-07-06 DIAGNOSIS — E1111 Type 2 diabetes mellitus with ketoacidosis with coma: Secondary | ICD-10-CM | POA: Diagnosis present

## 2017-07-06 DIAGNOSIS — R6520 Severe sepsis without septic shock: Secondary | ICD-10-CM | POA: Diagnosis present

## 2017-07-06 LAB — BETA-HYDROXYBUTYRIC ACID: Beta-Hydroxybutyric Acid: >8 mmol/L — ABNORMAL HIGH (ref 0.05–0.27)

## 2017-07-06 LAB — GLUCOSE, CAPILLARY
GLUCOSE-CAPILLARY: 555 mg/dL — AB (ref 65–99)
GLUCOSE-CAPILLARY: 559 mg/dL — AB (ref 65–99)
GLUCOSE-CAPILLARY: 233 mg/dL — AB (ref 65–99)
GLUCOSE-CAPILLARY: 107 mg/dL — AB (ref 65–99)
GLUCOSE-CAPILLARY: 116 mg/dL — AB (ref 65–99)
GLUCOSE-CAPILLARY: 175 mg/dL — AB (ref 65–99)
GLUCOSE-CAPILLARY: 136 mg/dL — AB (ref 65–99)
Glucose-Capillary: 474 mg/dL — ABNORMAL HIGH (ref 65–99)
Glucose-Capillary: 382 mg/dL — ABNORMAL HIGH (ref 65–99)
Glucose-Capillary: 442 mg/dL — ABNORMAL HIGH (ref 65–99)
Glucose-Capillary: 277 mg/dL — ABNORMAL HIGH (ref 65–99)
Glucose-Capillary: 151 mg/dL — ABNORMAL HIGH (ref 65–99)
Glucose-Capillary: 168 mg/dL — ABNORMAL HIGH (ref 65–99)
Glucose-Capillary: 181 mg/dL — ABNORMAL HIGH (ref 65–99)
Glucose-Capillary: 141 mg/dL — ABNORMAL HIGH (ref 65–99)
Glucose-Capillary: 126 mg/dL — ABNORMAL HIGH (ref 65–99)

## 2017-07-06 LAB — BASIC METABOLIC PANEL
Anion gap: 19 — ABNORMAL HIGH (ref 5–15)
Anion gap: 21 — ABNORMAL HIGH (ref 5–15)
Anion gap: 16 — ABNORMAL HIGH (ref 5–15)
Anion gap: 12 (ref 5–15)
BUN: 90 mg/dL — ABNORMAL HIGH (ref 6–20)
BUN: 84 mg/dL — AB (ref 6–20)
BUN: 79 mg/dL — ABNORMAL HIGH (ref 6–20)
BUN: 76 mg/dL — ABNORMAL HIGH (ref 6–20)
CALCIUM: 9 mg/dL (ref 8.9–10.3)
CHLORIDE: 124 mmol/L — AB (ref 101–111)
CHLORIDE: 127 mmol/L — AB (ref 101–111)
CHLORIDE: 126 mmol/L — AB (ref 101–111)
CHLORIDE: 128 mmol/L — AB (ref 101–111)
CO2: 16 mmol/L — ABNORMAL LOW (ref 22–32)
CO2: 13 mmol/L — AB (ref 22–32)
CO2: 19 mmol/L — ABNORMAL LOW (ref 22–32)
CO2: 20 mmol/L — ABNORMAL LOW (ref 22–32)
CREATININE: 4.13 mg/dL — AB (ref 0.44–1.00)
CREATININE: 3.41 mg/dL — AB (ref 0.44–1.00)
CREATININE: 3.04 mg/dL — AB (ref 0.44–1.00)
Calcium: 9.2 mg/dL (ref 8.9–10.3)
Calcium: 9.4 mg/dL (ref 8.9–10.3)
Calcium: 9.7 mg/dL (ref 8.9–10.3)
Creatinine, Ser: 3.49 mg/dL — ABNORMAL HIGH (ref 0.44–1.00)
GFR calc Af Amer: 15 mL/min — ABNORMAL LOW (ref >60)
GFR calc non Af Amer: 10 mL/min — ABNORMAL LOW (ref >60)
GFR calc non Af Amer: 13 mL/min — ABNORMAL LOW (ref >60)
GFR calc non Af Amer: 13 mL/min — ABNORMAL LOW (ref >60)
GFR, EST AFRICAN AMERICAN: 12 mL/min — AB (ref >60)
GFR, EST AFRICAN AMERICAN: 15 mL/min — AB (ref >60)
GFR, EST AFRICAN AMERICAN: 18 mL/min — AB (ref >60)
GFR, EST NON AFRICAN AMERICAN: 15 mL/min — AB (ref >60)
Glucose, Bld: 662 mg/dL (ref 65–99)
Glucose, Bld: 473 mg/dL — ABNORMAL HIGH (ref 65–99)
Glucose, Bld: 269 mg/dL — ABNORMAL HIGH (ref 65–99)
Glucose, Bld: 175 mg/dL — ABNORMAL HIGH (ref 65–99)
POTASSIUM: 2.6 mmol/L — AB (ref 3.5–5.1)
Potassium: 3.3 mmol/L — ABNORMAL LOW (ref 3.5–5.1)
Potassium: 3.3 mmol/L — ABNORMAL LOW (ref 3.5–5.1)
Potassium: 4.5 mmol/L (ref 3.5–5.1)
SODIUM: 159 mmol/L — AB (ref 135–145)
SODIUM: 161 mmol/L — AB (ref 135–145)
SODIUM: 160 mmol/L — AB (ref 135–145)
Sodium: 161 mmol/L (ref 135–145)

## 2017-07-06 LAB — COMPREHENSIVE METABOLIC PANEL
ALT: 21 U/L (ref 14–54)
AST: 30 U/L (ref 15–41)
Albumin: 4 g/dL (ref 3.5–5.0)
Alkaline Phosphatase: 82 U/L (ref 38–126)
Anion gap: 37 — ABNORMAL HIGH (ref 5–15)
BUN: 103 mg/dL — ABNORMAL HIGH (ref 6–20)
CHLORIDE: 102 mmol/L (ref 101–111)
CO2: 10 mmol/L — AB (ref 22–32)
CREATININE: 5.31 mg/dL — AB (ref 0.44–1.00)
Calcium: 11.3 mg/dL — ABNORMAL HIGH (ref 8.9–10.3)
GFR calc Af Amer: 9 mL/min — ABNORMAL LOW (ref >60)
GFR calc non Af Amer: 8 mL/min — ABNORMAL LOW (ref >60)
Glucose, Bld: 1265 mg/dL (ref 65–99)
POTASSIUM: 3.2 mmol/L — AB (ref 3.5–5.1)
SODIUM: 149 mmol/L — AB (ref 135–145)
Total Bilirubin: 1.4 mg/dL — ABNORMAL HIGH (ref 0.3–1.2)
Total Protein: 8.1 g/dL (ref 6.5–8.1)

## 2017-07-06 LAB — URINALYSIS, ROUTINE W REFLEX MICROSCOPIC
Bilirubin Urine: NEGATIVE
KETONES UR: 20 mg/dL — AB
Leukocytes, UA: NEGATIVE
Nitrite: NEGATIVE
PROTEIN: 100 mg/dL — AB
Specific Gravity, Urine: 1.018 (ref 1.005–1.030)
pH: 5 (ref 5.0–8.0)

## 2017-07-06 LAB — ECHOCARDIOGRAM COMPLETE
Height: 66 in
Weight: 2652.57 oz

## 2017-07-06 LAB — POCT I-STAT 3, ART BLOOD GAS (G3+)
Acid-base deficit: 16 mmol/L — ABNORMAL HIGH (ref 0.0–2.0)
Bicarbonate: 10 mmol/L — ABNORMAL LOW (ref 20.0–28.0)
O2 SAT: 96 %
PCO2 ART: 22.1 mmHg — AB (ref 32.0–48.0)
PH ART: 7.258 — AB (ref 7.350–7.450)
Patient temperature: 35.6
TCO2: 11 mmol/L — ABNORMAL LOW (ref 22–32)
pO2, Arterial: 89 mmHg (ref 83.0–108.0)

## 2017-07-06 LAB — LACTIC ACID, PLASMA
LACTIC ACID, VENOUS: 5.2 mmol/L — AB (ref 0.5–1.9)
Lactic Acid, Venous: 6.8 mmol/L (ref 0.5–1.9)
Lactic Acid, Venous: 9.7 mmol/L (ref 0.5–1.9)

## 2017-07-06 LAB — I-STAT ARTERIAL BLOOD GAS, ED
Acid-base deficit: 19 mmol/L — ABNORMAL HIGH (ref 0.0–2.0)
Bicarbonate: 7.9 mmol/L — ABNORMAL LOW (ref 20.0–28.0)
O2 SAT: 98 %
PCO2 ART: 22.7 mmHg — AB (ref 32.0–48.0)
PH ART: 7.15 — AB (ref 7.350–7.450)
PO2 ART: 131 mmHg — AB (ref 83.0–108.0)
Patient temperature: 98.5
TCO2: 9 mmol/L — ABNORMAL LOW (ref 22–32)

## 2017-07-06 LAB — RAPID URINE DRUG SCREEN, HOSP PERFORMED
AMPHETAMINES: NOT DETECTED
BENZODIAZEPINES: NOT DETECTED
COCAINE: NOT DETECTED
Opiates: NOT DETECTED
TETRAHYDROCANNABINOL: NOT DETECTED

## 2017-07-06 LAB — CBG MONITORING, ED: Glucose-Capillary: >600 mg/dL (ref 65–99)

## 2017-07-06 LAB — PHOSPHORUS: PHOSPHORUS: 1.2 mg/dL — AB (ref 2.5–4.6)

## 2017-07-06 LAB — MRSA PCR SCREENING: MRSA BY PCR: NEGATIVE

## 2017-07-06 LAB — I-STAT CHEM 8, ED
BUN: 83 mg/dL — AB (ref 6–20)
CALCIUM ION: 1.21 mmol/L (ref 1.15–1.40)
Chloride: 122 mmol/L — ABNORMAL HIGH (ref 101–111)
Creatinine, Ser: 3.9 mg/dL — ABNORMAL HIGH (ref 0.44–1.00)
Glucose, Bld: >700 mg/dL (ref 65–99)
HEMATOCRIT: 51 % — AB (ref 36.0–46.0)
HEMOGLOBIN: 17.3 g/dL — AB (ref 12.0–15.0)
Potassium: 3.9 mmol/L (ref 3.5–5.1)
SODIUM: 154 mmol/L — AB (ref 135–145)
TCO2: 12 mmol/L — AB (ref 22–32)

## 2017-07-06 LAB — HIV ANTIBODY (ROUTINE TESTING W REFLEX)
HIV Screen 4th Generation wRfx: NONREACTIVE
HIV Screen 4th Generation wRfx: NONREACTIVE

## 2017-07-06 LAB — TROPONIN I
TROPONIN I: 0.06 ng/mL — AB (ref <0.03)
Troponin I: 0.1 ng/mL (ref <0.03)
Troponin I: 0.2 ng/mL (ref <0.03)
Troponin I: 0.39 ng/mL (ref <0.03)

## 2017-07-06 LAB — PROCALCITONIN: PROCALCITONIN: 18.88 ng/mL

## 2017-07-06 LAB — I-STAT CG4 LACTIC ACID, ED: LACTIC ACID, VENOUS: 8.01 mmol/L — AB (ref 0.5–1.9)

## 2017-07-06 LAB — MAGNESIUM: MAGNESIUM: 3.8 mg/dL — AB (ref 1.7–2.4)

## 2017-07-06 LAB — AMMONIA: Ammonia: 23 umol/L (ref 9–35)

## 2017-07-06 MED ORDER — SODIUM CHLORIDE 0.9 % IV BOLUS
1000.0000 mL | Freq: Once | INTRAVENOUS | Status: AC
  Administered 2017-07-06: 1000 mL via INTRAVENOUS

## 2017-07-06 MED ORDER — INSULIN ASPART 100 UNIT/ML ~~LOC~~ SOLN
0.0000 [IU] | SUBCUTANEOUS | Status: DC
  Administered 2017-07-06: 3 [IU] via SUBCUTANEOUS
  Administered 2017-07-07 (×2): 4 [IU] via SUBCUTANEOUS
  Administered 2017-07-07: 7 [IU] via SUBCUTANEOUS
  Administered 2017-07-07 (×2): 4 [IU] via SUBCUTANEOUS
  Administered 2017-07-07: 7 [IU] via SUBCUTANEOUS
  Administered 2017-07-08 (×3): 3 [IU] via SUBCUTANEOUS

## 2017-07-06 MED ORDER — INSULIN GLARGINE 100 UNIT/ML ~~LOC~~ SOLN
23.0000 [IU] | Freq: Every day | SUBCUTANEOUS | Status: DC
  Administered 2017-07-06 – 2017-07-07 (×2): 23 [IU] via SUBCUTANEOUS
  Administered 2017-07-08: 15 [IU] via SUBCUTANEOUS
  Filled 2017-07-06 (×3): qty 0.23

## 2017-07-06 MED ORDER — ONDANSETRON HCL 4 MG/2ML IJ SOLN: 4.0000 mg | Freq: Four times a day (QID) | INTRAMUSCULAR | Status: DC | PRN

## 2017-07-06 MED ORDER — VANCOMYCIN HCL IN DEXTROSE 1-5 GM/200ML-% IV SOLN
1000.0000 mg | INTRAVENOUS | Status: DC
  Administered 2017-07-06: 1000 mg via INTRAVENOUS
  Filled 2017-07-06 (×2): qty 200

## 2017-07-06 MED ORDER — LACTATED RINGERS IV SOLN
INTRAVENOUS | Status: DC
  Administered 2017-07-06: 06:00:00 via INTRAVENOUS

## 2017-07-06 MED ORDER — WHITE PETROLATUM EX OINT
TOPICAL_OINTMENT | CUTANEOUS | Status: AC
  Administered 2017-07-06: 0.2
  Filled 2017-07-06: qty 28.35

## 2017-07-06 MED ORDER — SODIUM CHLORIDE 0.9 % IV BOLUS: 500.0000 mL | Freq: Once | INTRAVENOUS | Status: AC

## 2017-07-06 MED ORDER — PIPERACILLIN-TAZOBACTAM IN DEX 2-0.25 GM/50ML IV SOLN
2.2500 g | Freq: Three times a day (TID) | INTRAVENOUS | Status: DC
  Administered 2017-07-06 – 2017-07-07 (×4): 2.25 g via INTRAVENOUS
  Filled 2017-07-06 (×5): qty 50

## 2017-07-06 MED ORDER — SODIUM CHLORIDE 0.9 % IV SOLN: INTRAVENOUS | Status: DC

## 2017-07-06 MED ORDER — LACTATED RINGERS IV BOLUS
2000.0000 mL | Freq: Once | INTRAVENOUS | Status: AC
  Administered 2017-07-06: 2000 mL via INTRAVENOUS

## 2017-07-06 MED ORDER — POTASSIUM CHLORIDE 10 MEQ/100ML IV SOLN
10.0000 meq | INTRAVENOUS | Status: AC
  Administered 2017-07-06 (×6): 10 meq via INTRAVENOUS
  Filled 2017-07-06 (×6): qty 100

## 2017-07-06 MED ORDER — HEPARIN SODIUM (PORCINE) 5000 UNIT/ML IJ SOLN
5000.0000 [IU] | Freq: Three times a day (TID) | INTRAMUSCULAR | Status: DC
  Administered 2017-07-06 – 2017-07-09 (×10): 5000 [IU] via SUBCUTANEOUS
  Filled 2017-07-06 (×9): qty 1

## 2017-07-06 MED ORDER — DEXTROSE-NACL 5-0.45 % IV SOLN: INTRAVENOUS | Status: DC

## 2017-07-06 MED ORDER — ONDANSETRON HCL 4 MG/2ML IJ SOLN
4.0000 mg | Freq: Once | INTRAMUSCULAR | Status: AC
  Administered 2017-07-06: 4 mg via INTRAVENOUS
  Filled 2017-07-06: qty 2

## 2017-07-06 NOTE — Progress Notes (Signed)
CRITICAL VALUE ALERT  Critical Value:  Lactic Acid 9.7, Na+ 161  Date & Time Notied:  07/06/17 @ 0800  Provider Notified: Rexene Edison, NP

## 2017-07-06 NOTE — H&P (Signed)
PULMONARY / CRITICAL CARE MEDICINE   Name: Sandra Shelton MRN: [id number] DOB: Mar 31, 1955    ADMISSION DATE:  07/05/2017 CONSULTATION DATE:  07/06/2017  REFERRING MD:  Dr. Hal Hope   CHIEF COMPLAINT:  DKA  HISTORY OF PRESENT ILLNESS:   62 year old female with PMH of HTN  Presents to ED on 6/21 after being found obtunded at home. Sister states that she had spoke to patient hours before on phone and she said that she didn't feel good and could not get out of bed. Family reports fatigue, weight loss, polydipsia, and recent dental abscess. EMS reports that patient was non-responsive and glucose read HIGH. Upon arrival to ED BP 76/50. Afebrile, Tachycardiac. LA 10. WBC 18.2. Temp 98.5. Glucose 1265. PCCM asked to admit.   PAST MEDICAL HISTORY :  She  has a past medical history of Hypertension.  PAST SURGICAL HISTORY: She  has a past surgical history that includes Uterine fibroid surgery.  No Known Allergies  No current facility-administered medications on file prior to encounter.    Current Outpatient Medications on File Prior to Encounter  Medication Sig  . amLODipine (NORVASC) 10 MG tablet Take 10 mg by mouth daily.  Marland Kitchen aspirin 81 MG tablet Take 81 mg by mouth daily.  . betamethasone dipropionate (DIPROLENE) 0.05 % cream Apply topically 2 (two) times daily.  . cephALEXin (KEFLEX) 500 MG capsule Take 1 capsule (500 mg total) by mouth 4 (four) times daily.  . hydrochlorothiazide (HYDRODIURIL) 50 MG tablet Take 50 mg by mouth daily.  . meclizine (ANTIVERT) 25 MG tablet Take 1 tablet (25 mg total) by mouth 4 (four) times daily.  . pravastatin (PRAVACHOL) 20 MG tablet Take 20 mg by mouth daily.  Marland Kitchen sulfamethoxazole-trimethoprim (SEPTRA DS) 800-160 MG per tablet Take 1 tablet by mouth every 12 (twelve) hours.    FAMILY HISTORY:  Her indicated that the status of her sister is unknown.   SOCIAL HISTORY: She  reports that she has never smoked. She has never used smokeless tobacco. She  reports that she does not drink alcohol or use drugs.  REVIEW OF SYSTEMS:   Unable to review as patient is encephalopathic   SUBJECTIVE:  Confused, climbing out of the bed   VITAL SIGNS: BP (!) 129/59   Pulse 97   Temp 98.5 F (36.9 C) (Rectal)   Resp (!) 30   Ht 5\' 6"  (1.676 m)   Wt 77.1 kg (170 lb)   SpO2 100%   BMI 27.44 kg/m   HEMODYNAMICS:    VENTILATOR SETTINGS:    INTAKE / OUTPUT: No intake/output data recorded.  PHYSICAL EXAMINATION: General:  Adult female, tachypnea  Neuro:  Opens eyes to stimulation, does not follow commands, not oriented  HEENT:  Dry MM  Cardiovascular:  Tachy, no MRG  Lungs:  Clear breath sounds, no wheeze, labored  Abdomen:  Obese, active bowel sounds  Musculoskeletal:  -edema  Skin:  Warm, dry, intact   LABS:  BMET Recent Labs  Lab 07/05/17 2242 07/05/17 2254 07/06/17 0107  NA 149* 149* 154*  K 3.2* 3.2* 3.9  CL 102 114* 122*  CO2 10*  --   --   BUN 103* 92* 83*  CREATININE 5.31* 4.60* 3.90*  GLUCOSE 1,265* >700* >700*    Electrolytes Recent Labs  Lab 07/05/17 2242  CALCIUM 11.3*    CBC Recent Labs  Lab 07/05/17 2242 07/05/17 2254 07/06/17 0107  WBC 18.2*  --   --   HGB 18.4* 19.7* 17.3*  HCT 64.5* 58.0* 51.0*  PLT 329  --   --     Coag's Recent Labs  Lab 07/05/17 2242  INR 1.23    Sepsis Markers Recent Labs  Lab 07/05/17 2254 07/06/17 0108  LATICACIDVEN 10.19* 8.01*    ABG Recent Labs  Lab 07/06/17 0140  PHART 7.150*  PCO2ART 22.7*  PO2ART 131.0*    Liver Enzymes Recent Labs  Lab 07/05/17 2242  AST 30  ALT 21  ALKPHOS 82  BILITOT 1.4*  ALBUMIN 4.0    Cardiac Enzymes No results for input(s): TROPONINI, PROBNP in the last 168 hours.  Glucose Recent Labs  Lab 07/05/17 2220 07/06/17 0017 07/06/17 0135  GLUCAP >600* >600* >600*    Imaging Ct Head Wo Contrast  Result Date: 07/06/2017 CLINICAL DATA:  Altered level of consciousness. Patient was found in bed. Responsive  to painful stimuli. EXAM: CT HEAD WITHOUT CONTRAST TECHNIQUE: Contiguous axial images were obtained from the base of the skull through the vertex without intravenous contrast. COMPARISON:  MRI brain 10/28/2011 FINDINGS: Brain: Mild cerebral atrophy. No evidence of acute infarction, hemorrhage, hydrocephalus, extra-axial collection or mass lesion/mass effect. Vascular: Mild intracranial arterial vascular calcifications are present. Skull: Normal. Negative for fracture or focal lesion. Sinuses/Orbits: No acute finding. Other: None. IMPRESSION: No acute intracranial abnormalities.  Mild chronic atrophy. Electronically Signed   By: Lucienne Capers M.D.   On: 07/06/2017 00:48   Dg Chest Portable 1 View  Result Date: 07/05/2017 CLINICAL DATA:  62 year old female with altered mental status today. EXAM: PORTABLE CHEST 1 VIEW COMPARISON:  None. FINDINGS: Portable AP supine view at 2259 hours. Rotated to the right. Mildly low lung volumes. Allowing for portable technique the lungs are clear. Normal cardiac size and mediastinal contours. Visualized tracheal air column is within normal limits. No pneumothorax. Paucity of bowel gas in the upper abdomen. No acute osseous abnormality identified. IMPRESSION: No acute cardiopulmonary abnormality. Electronically Signed   By: Genevie Ann M.D.   On: 07/05/2017 23:35     STUDIES:  CXR 6/21 > No acute cardiopulmonary abnormality CT Head 6/22 > No acute. Mild chronic atrophy   CULTURES: Blood 6/22 >> U/A 6/22 >>   ANTIBIOTICS: Zosyn 6/22 >> Vancomycin 6/22 >>   SIGNIFICANT EVENTS: 6/21 > Presents to ED   LINES/TUBES: PIV   DISCUSSION: 62 year old female presents AMS secondary to DKA   ASSESSMENT / PLAN:  Respiratory Insufficieny in setting of DKA  Plan  -Monitor -Currently protecting airway  -Maintain Oxygen Saturation >92  Hypotension in setting of hypovolemia > Improved  H/O HTN Plan  -Cardiac Monitoring  -Maintain MAP >65 -Hold Norvasc,  Hydrodiuril -ECHO pending   Severe Anion Gap Metabolic Acidosis with Lactic Acidosis secondary to DKA AKI Plan  -Trend BMP -Replace electrolytes as indicated  -Trend LA  -DKA protocol  -S/P 2L NS   DKA -No previous h/o DM -Glucose 1265  Plan  -Trend Glucose  -DKA protocol as above  -Hemoglobin A1C pending   Leucocytosis  Recent Dental Abscess -U/A negative   Plan  -Trend WBC and Fever Curve -Trend PCT and LA  -PAN culture  -Continue Vancomycin/Zosyn   Metabolic Encephalopathy  -UDS negative -ETOH <10  -Tylenol <10 Plan  -Monitor -Avoid Sedating medications    FAMILY  - Updates: Family updated at bedside   - Inter-disciplinary family meet or Palliative Care meeting due by:  07/13/2017     Hayden Pedro, AGACNP-BC [facility]  Pgr: [phone]  PCCM Pgr:  [phone]   

## 2017-07-06 NOTE — ED Notes (Signed)
Notified RR RN of patient - not appropriate for SDU bed, Mindy agrees. To consult CCM.

## 2017-07-06 NOTE — Progress Notes (Signed)
  Echocardiogram 2D Echocardiogram has been performed.  Bobbye Charleston 07/06/2017, 9:37 AM

## 2017-07-06 NOTE — ED Notes (Signed)
Unable to obtain central venous access, attempted by CCM x5 with no success. Requesting IV team, CC does  Not want IO access.

## 2017-07-06 NOTE — Procedures (Signed)
Central Venous Catheter Insertion Procedure Note Sandra Shelton [id number] January 17, 1955  Procedure: Insertion of Central Venous Catheter Indications: Assessment of intravascular volume, Drug and/or fluid administration and Frequent blood sampling  Procedure Details Consent: Unable to obtain consent because of emergent medical necessity. Time Out: Verified patient identification, verified procedure, site/side was marked, verified correct patient position, special equipment/implants available, medications/allergies/relevent history reviewed, required imaging and test results available.  Performed  Maximum sterile technique was used including antiseptics, cap, gloves, gown, hand hygiene, mask and sheet. Skin prep: Chlorhexidine; local anesthetic administered Pt was placed in Trendelenberg Attempted: Right IJ placement of TLC US guidance used, able to get blood flow ( slow and resp dept/), unable to advance the guidewire past 5 cm Also attempted right femoral TLC under US guidance able to get blood flow ( slow) again introduced guidewire but unable to advance it past 5 cm.    Evaluation Unsuccessful attempt CXR shows no evidence of pneumothorax  Sandra Shelton 07/06/2017, 2:48 AM

## 2017-07-06 NOTE — Progress Notes (Signed)
Pharmacy Antibiotic Note  Sandra Shelton is a 62 y.o. female admitted on 07/05/2017 with altered mental status.  Pharmacy has been consulted for Vancomycin/Zosyn dosing for r/o sepsis. Recent dental abscess. WBC elevated. Acute renal failure.   Plan: Vancomycin 1000 mg IV q48h Zosyn 2.25g IV q8h Trend WBC, temp, renal function  F/U infectious work-up Drug levels as indicated  Height: 5\' 6"  (167.6 cm) Weight: 170 lb (77.1 kg) IBW/kg (Calculated) : 59.3  Temp (24hrs), Avg:98.5 F (36.9 C), Min:98.5 F (36.9 C), Max:98.5 F (36.9 C)  Recent Labs  Lab 07/05/17 2242 07/05/17 2254 07/06/17 0107 07/06/17 0108  WBC 18.2*  --   --   --   CREATININE 5.31* 4.60* 3.90*  --   LATICACIDVEN  --  10.19*  --  8.01*    Estimated Creatinine Clearance: 15.3 mL/min (A) (by C-G formula based on SCr of 3.9 mg/dL (H)).    No Known Allergies   Sandra Shelton 07/06/2017 3:09 AM

## 2017-07-06 NOTE — Progress Notes (Signed)
PULMONARY / CRITICAL CARE MEDICINE   Name: Sandra Shelton MRN: [id number] DOB: 07/31/1955    ADMISSION DATE:  07/05/2017 CONSULTATION DATE:  07/06/2017  REFERRING MD:  Dr. Hal Hope   CHIEF COMPLAINT:  DKA  HISTORY OF PRESENT ILLNESS:   62 year old female with PMH of HTN  Presents to ED on 6/21 after being found obtunded at home. Sister states that she had spoke to patient hours before on phone and she said that she didn't feel good and could not get out of bed. Family reports fatigue, weight loss, polydipsia, and recent dental abscess. EMS reports that patient was non-responsive and glucose read HIGH. Upon arrival to ED BP 76/50. Afebrile, Tachycardiac. LA 10. WBC 18.2. Temp 98.5. Glucose 1265. PCCM asked to admit.    SUBJECTIVE:   Admitted early this am with DKA , remains confused and agitated.  BS trending down from 1265 , this am 442  VITAL SIGNS: BP 115/64   Pulse 87   Temp (!) 97.2 F (36.2 C)   Resp (!) 23   Ht 5\' 6"  (1.676 m)   Wt 165 lb 12.6 oz (75.2 kg)   SpO2 98%   BMI 26.76 kg/m   HEMODYNAMICS:    VENTILATOR SETTINGS:    INTAKE / OUTPUT: I/O last 3 completed shifts: In: 5055.8 [I.V.:108.2; IV Piggyback:4947.6] Out: 525 [Urine:525]  PHYSICAL EXAMINATION: General: Adult female, agitated  Neuro: Alert, does not follow commands, not oriented  HEENT: Dry mucosa , AT/[place]  Cardiovascular:  RRR , no mrg  Lungs:  CTA , no wheezing  Abdomen:  Obese, Soft , +BS  Musculoskeletal:  No edema  Skin:  Warm, dry , intact   LABS:  BMET Recent Labs  Lab 07/05/17 2242 07/05/17 2254 07/06/17 0107 07/06/17 0201  NA 149* 149* 154* 159*  K 3.2* 3.2* 3.9 2.6*  CL 102 114* 122* 124*  CO2 10*  --   --  16*  BUN 103* 92* 83* 90*  CREATININE 5.31* 4.60* 3.90* 4.13*  GLUCOSE 1,265* >700* >700* 662*    Electrolytes Recent Labs  Lab 07/05/17 2242 07/06/17 0201  CALCIUM 11.3* 9.2  MG  --  3.8*  PHOS  --  1.2*    CBC Recent Labs  Lab 07/05/17 2242  07/05/17 2254 07/06/17 0107  WBC 18.2*  --   --   HGB 18.4* 19.7* 17.3*  HCT 64.5* 58.0* 51.0*  PLT 329  --   --     Coag's Recent Labs  Lab 07/05/17 2242  INR 1.23    Sepsis Markers Recent Labs  Lab 07/05/17 2254 07/06/17 0108 07/06/17 0201 07/06/17 0400  LATICACIDVEN 10.19* 8.01*  --  6.8*  PROCALCITON  --   --  18.88  --     ABG Recent Labs  Lab 07/06/17 0140 07/06/17 0511  PHART 7.150* 7.258*  PCO2ART 22.7* 22.1*  PO2ART 131.0* 89.0    Liver Enzymes Recent Labs  Lab 07/05/17 2242  AST 30  ALT 21  ALKPHOS 82  BILITOT 1.4*  ALBUMIN 4.0    Cardiac Enzymes Recent Labs  Lab 07/06/17 0128  TROPONINI 0.06*    Glucose Recent Labs  Lab 07/06/17 0135 07/06/17 0237 07/06/17 0337 07/06/17 0438 07/06/17 0547 07/06/17 0641  GLUCAP >600* >600* 555* 559* 474* 382*    Imaging Ct Head Wo Contrast  Result Date: 07/06/2017 CLINICAL DATA:  Altered level of consciousness. Patient was found in bed. Responsive to painful stimuli. EXAM: CT HEAD WITHOUT CONTRAST TECHNIQUE: Contiguous axial images  were obtained from the base of the skull through the vertex without intravenous contrast. COMPARISON:  MRI brain 10/28/2011 FINDINGS: Brain: Mild cerebral atrophy. No evidence of acute infarction, hemorrhage, hydrocephalus, extra-axial collection or mass lesion/mass effect. Vascular: Mild intracranial arterial vascular calcifications are present. Skull: Normal. Negative for fracture or focal lesion. Sinuses/Orbits: No acute finding. Other: None. IMPRESSION: No acute intracranial abnormalities.  Mild chronic atrophy. Electronically Signed   By: Lucienne Capers M.D.   On: 07/06/2017 00:48   Dg Chest Port 1 View  Result Date: 07/06/2017 CLINICAL DATA:  Follow-up examination status post failed central line placement. EXAM: PORTABLE CHEST 1 VIEW COMPARISON:  Prior radiograph from 07/05/2017. FINDINGS: Cardiac and mediastinal silhouettes are stable in size and contour, and  remain within normal limits. Aortic atherosclerosis. Lungs are hypoinflated. No new focal airspace disease. No pulmonary edema or pleural effusion. No pneumothorax. No acute osseous abnormality. IMPRESSION: Stable appearance of the chest. No pneumothorax or other complication status post attempted central line placement. Electronically Signed   By: Jeannine Boga M.D.   On: 07/06/2017 02:58   Dg Chest Portable 1 View  Result Date: 07/05/2017 CLINICAL DATA:  62 year old female with altered mental status today. EXAM: PORTABLE CHEST 1 VIEW COMPARISON:  None. FINDINGS: Portable AP supine view at 2259 hours. Rotated to the right. Mildly low lung volumes. Allowing for portable technique the lungs are clear. Normal cardiac size and mediastinal contours. Visualized tracheal air column is within normal limits. No pneumothorax. Paucity of bowel gas in the upper abdomen. No acute osseous abnormality identified. IMPRESSION: No acute cardiopulmonary abnormality. Electronically Signed   By: Genevie Ann M.D.   On: 07/05/2017 23:35     STUDIES:  CXR 6/21 > No acute cardiopulmonary abnormality CT Head 6/22 > No acute. Mild chronic atrophy   CULTURES: Blood 6/22 >> U/A 6/22 >>   ANTIBIOTICS: Zosyn 6/22 >> Vancomycin 6/22 >>   SIGNIFICANT EVENTS: 6/21 > Presents to ED   LINES/TUBES: PIV   DISCUSSION: 62 year old female presents AMS secondary to DKA   ASSESSMENT / PLAN:  Respiratory Insufficieny in setting of DKA  Plan  -Monitor -Currently protecting airway  -Maintain Oxygen Saturation >92  Hypotension in setting of hypovolemia > Improved  H/O HTN Plan  -Cardiac Monitoring  -Maintain MAP >65 -Hold Norvasc, Hydrodiuril -ECHO pending   Severe Anion Gap Metabolic Acidosis with Lactic Acidosis secondary to DKA AKI Plan  -Trend BMP -Replace electrolytes as indicated  -Trend LA  -DKA protocol  -S/P 3L NS and 2L LR   DKA -No previous h/o DM -Glucose 1265  Plan  -Trend Glucose  -DKA  protocol as above  -Hemoglobin A1C pending   Acute Renal Failure  Scr 5.31 to 4.13 Plan  Avoid nephrotoxin IV hydration  Tr I/O  Tr bmet   Leucocytosis  Recent Dental Abscess -U/A negative   Plan  -Trend WBC and Fever Curve -Trend PCT and LA  -PAN culture  -Continue Vancomycin/Zosyn   Metabolic Encephalopathy  -UDS negative -ETOH <10  -Tylenol <10 -CT head neg  Plan  -Monitor -Avoid Sedating medications    FAMILY  - Updates: Family updated at bedside   - Inter-disciplinary family meet or Palliative Care meeting due by:  07/13/2017    Savier Trickett NP-C  [facility]  [phone]   07/06/2017

## 2017-07-06 NOTE — ED Notes (Signed)
Date and time results received: 07/06/17 12:11 AM (use smartphrase ".now" to insert current time)  Test: Glucose Critical Value: 1265  Name of Provider Notified: [name] Heck MD   Orders Received? Or Actions Taken?: see new orders

## 2017-07-06 NOTE — ED Notes (Signed)
Delay in transporting to ICU, MDs requesting XRAY and attempting CVCinsertion

## 2017-07-06 NOTE — Progress Notes (Addendum)
CRITICAL VALUE ALERT  Critical Value:  Lactic acid 6.8   Potassium 2.6   Glucose 662  Date & Time Notied:  07/06/17 0503  Provider Notified: Warren Lacy  Orders Received/Actions taken:

## 2017-07-06 NOTE — ED Notes (Signed)
Admitting MD at bedside.

## 2017-07-06 NOTE — ED Notes (Signed)
Critical care providers at bedside putting in central line.

## 2017-07-06 NOTE — Progress Notes (Signed)
Critical ABG results given to MD at bedside.

## 2017-07-07 DIAGNOSIS — E870 Hyperosmolality and hypernatremia: Secondary | ICD-10-CM

## 2017-07-07 LAB — CBC
HCT: 40.9 % (ref 36.0–46.0)
Hemoglobin: 13.2 g/dL (ref 12.0–15.0)
MCH: 28.3 pg (ref 26.0–34.0)
MCHC: 32.3 g/dL (ref 30.0–36.0)
MCV: 87.6 fL (ref 78.0–100.0)
PLATELETS: 149 10*3/uL — AB (ref 150–400)
RBC: 4.67 MIL/uL (ref 3.87–5.11)
RDW: 13.2 % (ref 11.5–15.5)
WBC: 23.4 10*3/uL — ABNORMAL HIGH (ref 4.0–10.5)

## 2017-07-07 LAB — GLUCOSE, CAPILLARY
GLUCOSE-CAPILLARY: 106 mg/dL — AB (ref 65–99)
GLUCOSE-CAPILLARY: 181 mg/dL — AB (ref 65–99)
GLUCOSE-CAPILLARY: 194 mg/dL — AB (ref 65–99)
GLUCOSE-CAPILLARY: 203 mg/dL — AB (ref 65–99)
GLUCOSE-CAPILLARY: 225 mg/dL — AB (ref 65–99)
Glucose-Capillary: 177 mg/dL — ABNORMAL HIGH (ref 65–99)
Glucose-Capillary: 153 mg/dL — ABNORMAL HIGH (ref 65–99)

## 2017-07-07 LAB — BASIC METABOLIC PANEL
ANION GAP: 12 (ref 5–15)
BUN: 49 mg/dL — ABNORMAL HIGH (ref 6–20)
CO2: 19 mmol/L — ABNORMAL LOW (ref 22–32)
Calcium: 8.6 mg/dL — ABNORMAL LOW (ref 8.9–10.3)
Chloride: 124 mmol/L — ABNORMAL HIGH (ref 101–111)
Creatinine, Ser: 2.25 mg/dL — ABNORMAL HIGH (ref 0.44–1.00)
GFR calc Af Amer: 25 mL/min — ABNORMAL LOW (ref >60)
GFR, EST NON AFRICAN AMERICAN: 22 mL/min — AB (ref >60)
GLUCOSE: 355 mg/dL — AB (ref 65–99)
POTASSIUM: 4.2 mmol/L (ref 3.5–5.1)
Sodium: 155 mmol/L — ABNORMAL HIGH (ref 135–145)

## 2017-07-07 LAB — PROCALCITONIN: PROCALCITONIN: 24.93 ng/mL

## 2017-07-07 LAB — URINE CULTURE: CULTURE: NO GROWTH

## 2017-07-07 LAB — LACTIC ACID, PLASMA: Lactic Acid, Venous: 3.2 mmol/L (ref 0.5–1.9)

## 2017-07-07 LAB — MAGNESIUM: MAGNESIUM: 2.9 mg/dL — AB (ref 1.7–2.4)

## 2017-07-07 MED ORDER — ONDANSETRON HCL 4 MG/2ML IJ SOLN
4.0000 mg | Freq: Four times a day (QID) | INTRAMUSCULAR | Status: DC | PRN
  Administered 2017-07-07 – 2017-07-08 (×2): 4 mg via INTRAVENOUS
  Filled 2017-07-07 (×2): qty 2

## 2017-07-07 MED ORDER — CLINDAMYCIN PHOSPHATE 600 MG/50ML IV SOLN
600.0000 mg | Freq: Three times a day (TID) | INTRAVENOUS | Status: DC
  Administered 2017-07-07 – 2017-07-09 (×6): 600 mg via INTRAVENOUS
  Filled 2017-07-07 (×7): qty 50

## 2017-07-07 NOTE — Progress Notes (Signed)
CRITICAL VALUE ALERT  Critical Value:  Chloride >130  Date & Time Notied:  07/07/17 @ 6016  Provider Notified: Rexene Edison, NP

## 2017-07-07 NOTE — Progress Notes (Signed)
PULMONARY / CRITICAL CARE MEDICINE   Name: Sandra Shelton MRN: [id number] DOB: 08-18-1955    ADMISSION DATE:  07/05/2017 CONSULTATION DATE:  07/06/2017  REFERRING MD:  Dr. Hal Hope   CHIEF COMPLAINT:  DKA  HISTORY OF PRESENT ILLNESS:   62 year old female with PMH of HTN  Presents to ED on 6/21 after being found obtunded at home. Sister states that she had spoke to patient hours before on phone and she said that she didn't feel good and could not get out of bed. Family reports fatigue, weight loss, polydipsia, and recent dental abscess. EMS reports that patient was non-responsive and glucose read HIGH. Upon arrival to ED BP 76/50. Afebrile, Tachycardiac. LA 10. WBC 18.2. Temp 98.5. Glucose 1265. PCCM asked to admit.    SUBJECTIVE:   More alert and calm this am, responses slower and f/c intermittently .Marland Kitchen Family at bedside and updated.  BS trending down , renal fxn improving with increased UOP.  LA trending down      VITAL SIGNS: BP (!) 119/56   Pulse 94   Temp 99.1 F (37.3 C)   Resp (!) 27   Ht 5\' 6"  (1.676 m)   Wt 173 lb 15.1 oz (78.9 kg)   SpO2 98%   BMI 28.08 kg/m   HEMODYNAMICS:    VENTILATOR SETTINGS:    INTAKE / OUTPUT: I/O last 3 completed shifts: In: 8601.4 [I.V.:2857.8; IV Piggyback:5743.7] Out: 2055 [Urine:2055]  PHYSICAL EXAMINATION: General: Adult female mild restlessness   Neuro: More alert, intermittent following commands, not oriented  HEENT: NCAT, dry oral mucosa Cardiovascular: RRR, no mrg  Lungs: CTA, no wheezing  Abdomen: Obese, soft, positive bowel sounds Musculoskeletal: No edema  Skin: Warm dry and intact  LABS:  BMET Recent Labs  Lab 07/06/17 0953 07/06/17 1514 07/07/17 0611  NA 161* 160* 159*  K 3.3* 4.5 3.6  CL 126* 128* >130*  CO2 19* 20* 21*  BUN 79* 76* 60*  CREATININE 3.41* 3.04* 2.62*  GLUCOSE 269* 175* 221*    Electrolytes Recent Labs  Lab 07/06/17 0201  07/06/17 0953 07/06/17 1514 07/07/17 0312  07/07/17 0611  CALCIUM 9.2   < > 9.7 9.0  --  8.8*  MG 3.8*  --   --   --  2.9*  --   PHOS 1.2*  --   --   --   --   --    < > = values in this interval not displayed.    CBC Recent Labs  Lab 07/05/17 2242 07/05/17 2254 07/06/17 0107 07/07/17 0611  WBC 18.2*  --   --  23.4*  HGB 18.4* 19.7* 17.3* 13.2  HCT 64.5* 58.0* 51.0* 40.9  PLT 329  --   --  149*    Coag's Recent Labs  Lab 07/05/17 2242  INR 1.23    Sepsis Markers Recent Labs  Lab 07/06/17 0201  07/06/17 0643 07/06/17 1514 07/07/17 0312  LATICACIDVEN  --    < > 9.7* 5.2* 3.2*  PROCALCITON 18.88  --   --   --  24.93   < > = values in this interval not displayed.    ABG Recent Labs  Lab 07/06/17 0140 07/06/17 0511  PHART 7.150* 7.258*  PCO2ART 22.7* 22.1*  PO2ART 131.0* 89.0    Liver Enzymes Recent Labs  Lab 07/05/17 2242  AST 30  ALT 21  ALKPHOS 82  BILITOT 1.4*  ALBUMIN 4.0    Cardiac Enzymes Recent Labs  Lab 07/06/17 3382 07/06/17  1346 07/06/17 1514  TROPONINI 0.10* 0.20* 0.39*    Glucose Recent Labs  Lab 07/06/17 1813 07/06/17 1918 07/06/17 2026 07/06/17 2358 07/07/17 0346 07/07/17 0754  GLUCAP 141* 136* 126* 181* 225* 177*    Imaging No results found.   STUDIES:  CXR 6/21 > No acute cardiopulmonary abnormality CT Head 6/22 > No acute. Mild chronic atrophy  2D echo June 22>> moderate LVH, EF 75 to 61%, grade 1 diastolic dysfunction, suspected hypovolemia, dynamic obstruction , mid cavity obliteration  CULTURES: Blood 6/22 >> U/A 6/22 >>   ANTIBIOTICS: Zosyn 6/22 >> Vancomycin 6/22 >>   SIGNIFICANT EVENTS: 6/21 > Presents to ED   LINES/TUBES: PIV   DISCUSSION: 62 year old female presents AMS secondary to DKA   ASSESSMENT / PLAN:  Respiratory Insufficieny in setting of DKA  Plan  -Monitor -Currently protecting airway  -Maintain Oxygen Saturation >92  Hypotension in setting of hypovolemia > Improved  H/O HTN Echo showed systolic function  hyperdynamic with a EF of 75 to 80%, moderate LVH, grade 1 diastolic dysfunction Plan  -Cardiac Monitoring  -Maintain MAP >65 -Hold Norvasc, Hydrodiuril   Severe Anion Gap Metabolic Acidosis with Lactic Acidosis secondary to DKA> improving . LA is trending down , Gap closed  Hypernatremia/hyperchloremia  AKI-improving  Plan  -Trend BMP -Replace electrolytes as indicated  -Trend LA  -DKA protocol  -S/P 3L NS and 2L LR  -cont D51/2 NS at 125cc/hr  DKA -No previous h/o DM -Glucose 1265  Plan  -Trend Glucose  -DKA protocol as above  -Hemoglobin A1C pending   Acute Renal Failure  Scr 5.31 to 4.13>2.6  Plan  Avoid nephrotoxin IV hydration  Tr I/O  Tr bmet   Leucocytosis  Recent Dental Abscess -U/A negative   Plan  -Trend WBC and Fever Curve -Trend PCT and LA  -PAN culture  -Continue Vancomycin/Zosyn   Metabolic Encephalopathy  -UDS negative -ETOH <10  -Tylenol <10 -CT head neg  Plan  -Monitor -Avoid Sedating medications    FAMILY  - Updates: Family updated at bedside   - Inter-disciplinary family meet or Palliative Care meeting due by:  07/13/2017    Mikelle Myrick NP-C  [facility]  [phone]   07/07/2017

## 2017-07-07 NOTE — Progress Notes (Signed)
Pharmacy Antibiotic Note  Sandra Shelton is a 62 y.o. female admitted on 07/05/2017 with altered mental status and DKA. Patient had recent surgery for a dental abscess. Pharmacy has been consulted for clindamycin dosing. WBC 23.4 today and patient afebrile. Renal function improving with Scr down to 2.62.   Plan: Clindamycin 600 mg IV Q 8 hours Pharmacy will monitor peripherally  Consider conversion to oral therapy as appropriate  Height: 5\' 6"  (167.6 cm) Weight: 173 lb 15.1 oz (78.9 kg) IBW/kg (Calculated) : 59.3  Temp (24hrs), Avg:99.1 F (37.3 C), Min:97.5 F (36.4 C), Max:99.9 F (37.7 C)  Recent Labs  Lab 07/05/17 2242  07/06/17 0108 07/06/17 0201 07/06/17 0400 07/06/17 0643 07/06/17 0953 07/06/17 1514 07/07/17 0312 07/07/17 0611  WBC 18.2*  --   --   --   --   --   --   --   --  23.4*  CREATININE 5.31*   < >  --  4.13*  --  3.49* 3.41* 3.04*  --  2.62*  LATICACIDVEN  --    < > 8.01*  --  6.8* 9.7*  --  5.2* 3.2*  --    < > = values in this interval not displayed.    Estimated Creatinine Clearance: 23 mL/min (A) (by C-G formula based on SCr of 2.62 mg/dL (H)).    No Known Allergies  6/22 Vanc>>6/23 6/22 Zosyn>>6/23 6/23 Clinda>>  6/21 BCx: NGTD 6/21 Urine: In process 6/22 MRSA PCR: Negative   Thank you for allowing pharmacy to be a part of this patient's care.  Jimmy Footman, PharmD, BCPS PGY2 Infectious Diseases Pharmacy Resident Phone: [phone] 07/07/2017 11:17 AM

## 2017-07-07 NOTE — Progress Notes (Signed)
CRITICAL VALUE ALERT  Critical Value:  Lactic Acid= 3.2  Date & Time Notied: 07/07/17 @ 1443  Provider Notified: Leanora Ivanoff RN  Orders Received/Actions taken: none at this time

## 2017-07-08 ENCOUNTER — Inpatient Hospital Stay (HOSPITAL_COMMUNITY): Payer: Medicaid Other

## 2017-07-08 DIAGNOSIS — I6330 Cerebral infarction due to thrombosis of unspecified cerebral artery: Secondary | ICD-10-CM

## 2017-07-08 DIAGNOSIS — I63412 Cerebral infarction due to embolism of left middle cerebral artery: Secondary | ICD-10-CM

## 2017-07-08 LAB — BASIC METABOLIC PANEL
Anion gap: 10 (ref 5–15)
BUN: 60 mg/dL — ABNORMAL HIGH (ref 6–20)
BUN: 36 mg/dL — ABNORMAL HIGH (ref 6–20)
CALCIUM: 8.9 mg/dL (ref 8.9–10.3)
CO2: 21 mmol/L — AB (ref 22–32)
CO2: 23 mmol/L (ref 22–32)
CREATININE: 2.62 mg/dL — AB (ref 0.44–1.00)
Calcium: 8.8 mg/dL — ABNORMAL LOW (ref 8.9–10.3)
Chloride: >130 mmol/L (ref 101–111)
Chloride: 128 mmol/L — ABNORMAL HIGH (ref 101–111)
Creatinine, Ser: 1.83 mg/dL — ABNORMAL HIGH (ref 0.44–1.00)
GFR calc non Af Amer: 18 mL/min — ABNORMAL LOW (ref >60)
GFR, EST AFRICAN AMERICAN: 21 mL/min — AB (ref >60)
GFR, EST AFRICAN AMERICAN: 33 mL/min — AB (ref >60)
GFR, EST NON AFRICAN AMERICAN: 28 mL/min — AB (ref >60)
GLUCOSE: 141 mg/dL — AB (ref 65–99)
Glucose, Bld: 221 mg/dL — ABNORMAL HIGH (ref 65–99)
POTASSIUM: 3.1 mmol/L — AB (ref 3.5–5.1)
Potassium: 3.6 mmol/L (ref 3.5–5.1)
Sodium: 159 mmol/L — ABNORMAL HIGH (ref 135–145)
Sodium: 161 mmol/L (ref 135–145)

## 2017-07-08 LAB — LIPID PANEL
CHOLESTEROL: 186 mg/dL (ref 0–200)
HDL: 19 mg/dL — ABNORMAL LOW (ref >40)
LDL Cholesterol: UNDETERMINED mg/dL (ref 0–99)
TRIGLYCERIDES: 462 mg/dL — AB (ref <150)
Total CHOL/HDL Ratio: 9.8 RATIO
VLDL: UNDETERMINED mg/dL (ref 0–40)

## 2017-07-08 LAB — CBC
HEMATOCRIT: 43.2 % (ref 36.0–46.0)
Hemoglobin: 13.8 g/dL (ref 12.0–15.0)
MCH: 28.2 pg (ref 26.0–34.0)
MCHC: 31.9 g/dL (ref 30.0–36.0)
MCV: 88.3 fL (ref 78.0–100.0)
Platelets: 121 10*3/uL — ABNORMAL LOW (ref 150–400)
RBC: 4.89 MIL/uL (ref 3.87–5.11)
RDW: 13.9 % (ref 11.5–15.5)
WBC: 18.5 10*3/uL — ABNORMAL HIGH (ref 4.0–10.5)

## 2017-07-08 LAB — GLUCOSE, CAPILLARY
GLUCOSE-CAPILLARY: 147 mg/dL — AB (ref 65–99)
Glucose-Capillary: 101 mg/dL — ABNORMAL HIGH (ref 65–99)
Glucose-Capillary: 130 mg/dL — ABNORMAL HIGH (ref 65–99)
Glucose-Capillary: 143 mg/dL — ABNORMAL HIGH (ref 65–99)
Glucose-Capillary: 93 mg/dL (ref 65–99)
Glucose-Capillary: 95 mg/dL (ref 65–99)

## 2017-07-08 LAB — PROCALCITONIN: PROCALCITONIN: 7.78 ng/mL

## 2017-07-08 LAB — TSH: TSH: 0.496 u[IU]/mL (ref 0.350–4.500)

## 2017-07-08 LAB — HEMOGLOBIN A1C: Hgb A1c MFr Bld: >15.5 % — ABNORMAL HIGH (ref 4.8–5.6)

## 2017-07-08 LAB — LACTIC ACID, PLASMA: Lactic Acid, Venous: 2.3 mmol/L (ref 0.5–1.9)

## 2017-07-08 LAB — SODIUM: Sodium: 156 mmol/L — ABNORMAL HIGH (ref 135–145)

## 2017-07-08 MED ORDER — INSULIN GLARGINE 100 UNIT/ML ~~LOC~~ SOLN
15.0000 [IU] | Freq: Every day | SUBCUTANEOUS | Status: DC
  Filled 2017-07-08 (×2): qty 0.15

## 2017-07-08 MED ORDER — SODIUM CHLORIDE 0.45 % IV SOLN
INTRAVENOUS | Status: DC
  Administered 2017-07-08 – 2017-07-09 (×3): via INTRAVENOUS

## 2017-07-08 MED ORDER — POTASSIUM CHLORIDE 10 MEQ/100ML IV SOLN
10.0000 meq | INTRAVENOUS | Status: AC
  Administered 2017-07-08 (×4): 10 meq via INTRAVENOUS
  Filled 2017-07-08 (×4): qty 100

## 2017-07-08 MED ORDER — SODIUM CHLORIDE 0.9 % IV BOLUS
1000.0000 mL | Freq: Once | INTRAVENOUS | Status: AC
  Administered 2017-07-08: 1000 mL via INTRAVENOUS

## 2017-07-08 MED ORDER — LIVING WELL WITH DIABETES BOOK
Freq: Once | Status: AC
  Administered 2017-07-08: 15:00:00
  Filled 2017-07-08: qty 1

## 2017-07-08 MED ORDER — LORAZEPAM 2 MG/ML IJ SOLN: 1.0000 mg | Freq: Three times a day (TID) | INTRAMUSCULAR | Status: DC

## 2017-07-08 MED ORDER — TOPIRAMATE 100 MG PO TABS: 200.0000 mg | ORAL_TABLET | Freq: Once | ORAL | Status: DC

## 2017-07-08 MED ORDER — HYDRALAZINE HCL 20 MG/ML IJ SOLN: 10.0000 mg | INTRAMUSCULAR | Status: DC | PRN

## 2017-07-08 MED ORDER — INSULIN GLARGINE 100 UNIT/ML ~~LOC~~ SOLN: 15.0000 [IU] | Freq: Every day | SUBCUTANEOUS | Status: DC

## 2017-07-08 MED ORDER — STROKE: EARLY STAGES OF RECOVERY BOOK
Freq: Once | Status: AC
  Administered 2017-07-08: 18:00:00
  Filled 2017-07-08: qty 1

## 2017-07-08 NOTE — Progress Notes (Addendum)
PULMONARY / CRITICAL CARE MEDICINE   Name: Sandra Shelton MRN: [id number] DOB: 02/20/1955    ADMISSION DATE:  07/05/2017 CONSULTATION DATE:  07/06/2017  REFERRING MD:  Dr. Hal Hope   CHIEF COMPLAINT:  DKA  HISTORY OF PRESENT ILLNESS:   62 year old female with PMH of HTN  Presents to ED on 6/21 after being found obtunded at home. Sister states that she had spoke to patient hours before on phone and she said that she didn't feel good and could not get out of bed. Family reports fatigue, weight loss, polydipsia, and recent dental abscess. EMS reports that patient was non-responsive and glucose read HIGH. Upon arrival to ED BP 76/50. Afebrile, Tachycardiac. LA 10. WBC 18.2. Temp 98.5. Glucose 1265. PCCM asked to admit.    SUBJECTIVE:   Tmax 100,  Otherwise hemodynamically stable on room air with good UOP however remains with altered mental status, repeats phrases and more right sided weakness noted per RN Still has sitter at bedside and posey for at times trying to climb out of bed Na 161 Improving sCr, PCT and LA    VITAL SIGNS: BP (!) 147/74   Pulse 76   Temp 99.1 F (37.3 C)   Resp 18   Ht 5\' 6"  (1.676 m)   Wt 176 lb 5.9 oz (80 kg)   SpO2 99%   BMI 28.47 kg/m   HEMODYNAMICS:    VENTILATOR SETTINGS:    INTAKE / OUTPUT: I/O last 3 completed shifts: In: 5575.9 [I.V.:4226.3; IV Piggyback:1349.6] Out: 3705 [Urine:3705]  PHYSICAL EXAMINATION: General:  Adult female lying in bed in NAD HEENT: MM pink/moist, pupils 4/ reactive, mm moist, slight right facial asymmetry, no obvious submandibular swelling, voice soft Neuro: Very slowed responses, wakens to verbal, f/c, R side seems slightly weaker than right, oriented to name  CV:  rrr, no m/r/g PULM: even/non-labored, lungs bilaterally clear GI: obese, soft, non-tender, bs active  Extremities: warm/dry, no Skin: no rashes    LABS:  BMET Recent Labs  Lab 07/07/17 0611 07/07/17 1547 07/08/17 0251  NA 159* 155* 161*   K 3.6 4.2 3.1*  CL >130* 124* 128*  CO2 21* 19* 23  BUN 60* 49* 36*  CREATININE 2.62* 2.25* 1.83*  GLUCOSE 221* 355* 141*    Electrolytes Recent Labs  Lab 07/06/17 0201  07/07/17 0312 07/07/17 0611 07/07/17 1547 07/08/17 0251  CALCIUM 9.2   < >  --  8.8* 8.6* 8.9  MG 3.8*  --  2.9*  --   --   --   PHOS 1.2*  --   --   --   --   --    < > = values in this interval not displayed.    CBC Recent Labs  Lab 07/05/17 2242  07/06/17 0107 07/07/17 0611 07/08/17 0251  WBC 18.2*  --   --  23.4* 18.5*  HGB 18.4*   < > 17.3* 13.2 13.8  HCT 64.5*   < > 51.0* 40.9 43.2  PLT 329  --   --  149* 121*   < > = values in this interval not displayed.    Coag's Recent Labs  Lab 07/05/17 2242  INR 1.23    Sepsis Markers Recent Labs  Lab 07/06/17 0201  07/06/17 1514 07/07/17 0312 07/08/17 0251  LATICACIDVEN  --    < > 5.2* 3.2* 2.3*  PROCALCITON 18.88  --   --  24.93 7.78   < > = values in this interval not displayed.  ABG Recent Labs  Lab 07/06/17 0140 07/06/17 0511  PHART 7.150* 7.258*  PCO2ART 22.7* 22.1*  PO2ART 131.0* 89.0    Liver Enzymes Recent Labs  Lab 07/05/17 2242  AST 30  ALT 21  ALKPHOS 82  BILITOT 1.4*  ALBUMIN 4.0    Cardiac Enzymes Recent Labs  Lab 07/06/17 0643 07/06/17 1346 07/06/17 1514  TROPONINI 0.10* 0.20* 0.39*    Glucose Recent Labs  Lab 07/07/17 1130 07/07/17 1620 07/07/17 2008 07/07/17 2340 07/08/17 0336 07/08/17 0802  GLUCAP 194* 203* 153* 106* 130* 143*    Imaging No results found.   STUDIES:  CXR 6/21 > No acute cardiopulmonary abnormality CT Head 6/22 > No acute. Mild chronic atrophy  2D echo June 22>> moderate LVH, EF 75 to 32%, grade 1 diastolic dysfunction, suspected hypovolemia, dynamic obstruction , mid cavity obliteration  CTH and maxillofacial >>   CULTURES: Blood 6/21 >> ngtd x 2  U/A 6/22 >> neg 6/22 MRSA PCR >>  ANTIBIOTICS: Zosyn 6/22 >>6/23 Vancomycin 6/22  Clindamycin 6/23  >>  SIGNIFICANT EVENTS: 6/21 > Presents to ED   6/22 > off insulin gtt  LINES/TUBES: PIV   DISCUSSION: 62 year old female presents AMS secondary to DKA, continued AMS after resolution of DKA  ASSESSMENT / PLAN:  Respiratory Insufficieny in setting of DKA  Plan  -Monitor -Currently protecting airway  -Maintain Oxygen Saturation >92% - remains NPO secondary to mental status, consider SLP if does not improve  Hypotension in setting of hypovolemia > Improved  H/O HTN Echo showed systolic function hyperdynamic with a EF of 75 to 80%, moderate LVH, grade 1 diastolic dysfunction Plan  -Cardiac Monitoring  -Maintain MAP >65 -Hold Norvasc, Hydrodiuril  Severe Anion Gap Metabolic Acidosis/ hyperchloremic with Lactic Acidosis secondary to DKA> improving, Gap closed/ lactate improving Hypernatremia/hyperchloremia  AKI-improving  - calculated water deficit 6248 Plan  -Trend BMP/ mag/ phos - trend UOP -Replace electrolytes as indicated  - change IVF to 0.45 NS at 120ml/hr, repeat Bmet at 1600  DKA -No previous h/o DM -Glucose 1265  Plan  - CBG q 4 - SSI resistant - decrease Lantus to 15 units daily- given NPO and removing D5 from IVF - Consult diabetes coordinator  -Hemoglobin A1C pending (lab verified was sent out to [employer] [facility] 6/24)  Leucocytosis  Recent Dental Abscess Thrombocytopenia  -U/A negative   Plan  -Trend WBC and Fever Curve -Trend PCT  - follow BC - continue clindamycin - CT maxillofacial   Metabolic Encephalopathy  -UDS negative -ETOH <10  -Tylenol <10 -CT head neg  Plan  - Monitor - Avoid Sedating medications  - repeat CT head 6/24 - continue safety sitter as needed   FAMILY  - Updates: no family at bedside 6/24  - Inter-disciplinary family meet or Palliative Care meeting due by:  07/13/2017    Kennieth Rad, AGACNP-BC [facility] Pgr: [phone] or if no answer [phone] 07/08/2017, 10:22 AM

## 2017-07-08 NOTE — Progress Notes (Signed)
PCCM Interval Note   Aware of CT head results of acute to subacute Left frontal and caudate infarcts compared to [facility] on 6/21.  Continued NPO and frequent neuro checks.  Neurology consulted.     Kennieth Rad, AGACNP-BC [facility] Pgr: [phone] or if no answer [phone] 07/08/2017, 3:12 PM

## 2017-07-08 NOTE — Progress Notes (Addendum)
[facility] Progress Note Patient Name: Sandra Shelton DOB: 29-Apr-1955 MRN: [id number]   Date of Service  07/08/2017  HPI/Events of Note  Multiple issues: 1. K+ = 3.1 and Creatinine = 1.83, 1. Lactic Acid = 2.3 and LVEF = 75% to 80, 3. PCT = 7.78 - Patient already on Clindamycin and BP = 161/100.  eICU Interventions  Will order: 1. Replace K+. 2. 0.9 NaCl 1 liter IV over 1 hour now.  3. Hydralazine 10 mg IV Q 4 hours PRN SBP > 170 or DBP > 100.     Intervention Category Major Interventions: Acid-Base disturbance - evaluation and management;Electrolyte abnormality - evaluation and management;Hypertension - evaluation and management  Sommer,Steven [name] 07/08/2017, 5:09 AM

## 2017-07-08 NOTE — Consult Note (Addendum)
Requesting Physician: Dr. Waunita Schooner    Chief Complaint: Left small vessel stroke  History obtained from:  chart  HPI:                                                                                                                                         Sandra Shelton is an 62 y.o. female with past medical history of hypertension.  Patient presented to the ED on 6/21 after being found obtunded at home.  Apparently sister had spoken to her hours before and the phone and patient stated that she did not feel good.  She said that she could not get out of bed.  At time of EMS and ED report patient reported feeling fatigue, weight loss, polydipsia and recent dental abscess.  When patient was seen by EMS it was noted that she was nonresponsive with a glucose that was read as high in the ED found to be 1265.  She also had a blood pressure of 76/50 while in the ED.  She was tachycardic with WBC of 18.2 and temperature of 98.5.  Patient was admitted to the hospital for DKA, hypotension in the setting of hypovolemia, severe anion gap metabolic acidosis/hyperchloremia along with leukocytosis and thrombocytopenia.  Seen assessment the nurse noted that her right grip was weaker than her left.  She also noted a right facial droop.  For that reason a CT of head was ordered.  CT of head did show positive for subacute left frontal and left caudate infarcts.  For this reason neurology was consulted.  Date last known well: Unable to determine Time last known well: Unable to determine tPA Given: No: unable to determine LSW NIHSS 2 Modified Rankin: Rankin Score=0    Past Medical History:  Diagnosis Date  . Hypertension     Past Surgical History:  Procedure Laterality Date  . UTERINE FIBROID SURGERY      Family History  Problem Relation Age of Onset  . Diabetes Mellitus II Sister      Social History:  reports that she has never smoked. She has never used smokeless tobacco. She reports that she does not drink  alcohol or use drugs.  Allergies: No Known Allergies  Medications:  Prior to Admission:  Medications Prior to Admission  Medication Sig Dispense Refill Last Dose  . aspirin 81 MG tablet Take 81 mg by mouth daily.   unknown  . betamethasone dipropionate (DIPROLENE) 0.05 % cream Apply topically 2 (two) times daily. (Patient not taking: Reported on 07/06/2017) 15 g 1 Not Taking at Unknown time  . cloNIDine (CATAPRES) 0.2 MG tablet Take 0.2 mg by mouth 2 (two) times daily.   unknown  . meclizine (ANTIVERT) 25 MG tablet Take 1 tablet (25 mg total) by mouth 4 (four) times daily. (Patient not taking: Reported on 07/06/2017) 28 tablet 0 Not Taking at Unknown time  . metFORMIN (GLUCOPHAGE) 500 MG tablet Take 500 mg by mouth 2 (two) times daily with a meal.   unknown  . metoCLOPramide (REGLAN) 10 MG tablet Take 10 mg by mouth 4 (four) times daily.   unknown  . rosuvastatin (CRESTOR) 20 MG tablet Take 20 mg by mouth daily.   unknown  . triamterene-hydrochlorothiazide (DYAZIDE) 37.5-25 MG capsule Take 1 capsule by mouth daily.   unknown   Scheduled: . heparin  5,000 Units Subcutaneous Q8H  . insulin aspart  0-20 Units Subcutaneous Q4H  . insulin glargine  15 Units Subcutaneous Daily   Continuous: . sodium chloride Stopped (07/08/17 1433)  . clindamycin (CLEOCIN) IV 60 mL/hr at 07/08/17 1500  . insulin (NOVOLIN-R) infusion Stopped (07/06/17 2035)    ROS:                                                                                                                                       History obtained from the patient  General ROS: negative for - chills, fatigue, fever, night sweats, weight gain or weight loss Psychological ROS: negative for - , hallucinations, memory difficulties, mood swings or  Ophthalmic ROS: negative for - blurry vision, double vision, eye pain or loss of  vision ENT ROS: negative for - epistaxis, nasal discharge, oral lesions, sore throat, tinnitus or vertigo Respiratory ROS: negative for - cough,  shortness of breath or wheezing Cardiovascular ROS: negative for - chest pain, dyspnea on exertion,  Gastrointestinal ROS: negative for - abdominal pain, diarrhea,  nausea/vomiting or stool incontinence Genito-Urinary ROS: negative for - dysuria, hematuria, incontinence or urinary frequency/urgency Musculoskeletal ROS: negative for - joint swelling or muscular weakness Neurological ROS: as noted in HPI   General Examination:  Blood pressure (!) 146/61, pulse 71, temperature 99.5 F (37.5 C), resp. rate 17, height 5\' 6"  (1.676 m), weight 80 kg (176 lb 5.9 oz), SpO2 100 %.  HEENT-  Normocephalic, no lesions, without obvious abnormality.  Normal external eye and conjunctiva.   Extremities- Warm, dry and intact Musculoskeletal-no joint tenderness, deformity or swelling Skin-warm and dry, no hyperpigmentation, vitiligo, or suspicious lesions  Neurological Examination Mental Status: Alert, oriented to hospital, year and month but still slightly encephalopathic with bradyphrenia and bradykinesia.  Speech fluent without evidence of aphasia.  Able to follow temple commands without difficulty. Cranial Nerves: II:  Visual fields grossly normal,  III,IV, VI: ptosis not present, extra-ocular motions intact bilaterally, pupils equal, round, reactive to light and accommodation V,VII: smile symmetric, facial light touch sensation normal bilaterally VIII: hearing normal bilaterally IX,X: uvula rises symmetrically XI: bilateral shoulder shrug XII: midline tongue extension Motor: Right : Upper extremity   5/5    Left:     Upper extremity   5/5  Lower extremity   5/5     Lower extremity   5/5 Tone and bulk:normal tone throughout; no atrophy noted Sensory:  Pinprick and light touch intact throughout, bilaterally with the exception of on her face to which she has decreased sensation on the right her face Deep Tendon Reflexes: 2+ and symmetric throughout Plantars: Right: downgoing   Left: downgoing Cerebellar: normal finger-to-nose,and normal heel-to-shin test    Lab Results: Basic Metabolic Panel: Recent Labs  Lab 07/06/17 0201  07/06/17 0953 07/06/17 1514 07/07/17 0312 07/07/17 0611 07/07/17 1547 07/08/17 0251  NA 159*   < > 161* 160*  --  159* 155* 161*  K 2.6*   < > 3.3* 4.5  --  3.6 4.2 3.1*  CL 124*   < > 126* 128*  --  >130* 124* 128*  CO2 16*   < > 19* 20*  --  21* 19* 23  GLUCOSE 662*   < > 269* 175*  --  221* 355* 141*  BUN 90*   < > 79* 76*  --  60* 49* 36*  CREATININE 4.13*   < > 3.41* 3.04*  --  2.62* 2.25* 1.83*  CALCIUM 9.2   < > 9.7 9.0  --  8.8* 8.6* 8.9  MG 3.8*  --   --   --  2.9*  --   --   --   PHOS 1.2*  --   --   --   --   --   --   --    < > = values in this interval not displayed.    CBC: Recent Labs  Lab 07/05/17 2242 07/05/17 2254 07/06/17 0107 07/07/17 0611 07/08/17 0251  WBC 18.2*  --   --  23.4* 18.5*  NEUTROABS 16.3*  --   --   --   --   HGB 18.4* 19.7* 17.3* 13.2 13.8  HCT 64.5* 58.0* 51.0* 40.9 43.2  MCV 100.0  --   --  87.6 88.3  PLT 329  --   --  149* 121*    Lipid Panel: No results for input(s): CHOL, TRIG, HDL, CHOLHDL, VLDL, LDLCALC in the last 168 hours.  CBG: Recent Labs  Lab 07/07/17 2008 07/07/17 2340 07/08/17 0336 07/08/17 0802 07/08/17 1151  GLUCAP 153* 106* 130* 143* 147*    Imaging: Ct Head Wo Contrast  Result Date: 07/08/2017 CLINICAL DATA:  62 year old female with altered mental status and RIGHT facial pain and swelling. EXAM: CT HEAD  WITHOUT CONTRAST CT MAXILLOFACIAL WITHOUT CONTRAST TECHNIQUE: Multidetector CT imaging of the head and maxillofacial structures were performed using the standard protocol without intravenous contrast. Multiplanar CT image  reconstructions of the maxillofacial structures were also generated. COMPARISON:  07/05/2017 head CT FINDINGS: CT HEAD FINDINGS Brain: Two small to moderate LEFT frontal infarcts and a LEFT caudate infarct appears acute to subacute and new on CT since 07/05/2017. There is no evidence of hemorrhage or mass effect. Atrophy and mild chronic small-vessel white matter ischemic changes are again noted. No midline shift, hydrocephalus or extra-axial collection identified. Vascular: Atherosclerotic calcifications again noted. Skull: Normal. Negative for fracture or focal lesion. Other: None. CT MAXILLOFACIAL FINDINGS Osseous: No fracture or mandibular dislocation. No destructive process. Orbits: Negative. No traumatic or inflammatory finding. Sinuses: Clear. Soft tissues: Negative. IMPRESSION: 1. 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 No hemorrhage or mass effect. 2. Mild atrophy and chronic small-vessel white matter ischemic changes. 3. No significant facial abnormality. Electronically Signed   By: Margarette Canada M.D.   On: 07/08/2017 14:43   Ct Maxillofacial Wo Contrast  Result Date: 07/08/2017 CLINICAL DATA:  62 year old female with altered mental status and RIGHT facial pain and swelling. EXAM: CT HEAD WITHOUT CONTRAST CT MAXILLOFACIAL WITHOUT CONTRAST TECHNIQUE: Multidetector CT imaging of the head and maxillofacial structures were performed using the standard protocol without intravenous contrast. Multiplanar CT image reconstructions of the maxillofacial structures were also generated. COMPARISON:  07/05/2017 head CT FINDINGS: CT HEAD FINDINGS Brain: Two small to moderate LEFT frontal infarcts and a LEFT caudate infarct appears acute to subacute and new on CT since 07/05/2017. There is no evidence of hemorrhage or mass effect. Atrophy and mild chronic small-vessel white matter ischemic changes are again noted. No midline shift, hydrocephalus or extra-axial collection  identified. Vascular: Atherosclerotic calcifications again noted. Skull: Normal. Negative for fracture or focal lesion. Other: None. CT MAXILLOFACIAL FINDINGS Osseous: No fracture or mandibular dislocation. No destructive process. Orbits: Negative. No traumatic or inflammatory finding. Sinuses: Clear. Soft tissues: Negative. IMPRESSION: 1. 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 No hemorrhage or mass effect. 2. Mild atrophy and chronic small-vessel white matter ischemic changes. 3. No significant facial abnormality. Electronically Signed   By: Margarette Canada M.D.   On: 07/08/2017 14:43   Echo: Study Conclusions  - Left ventricle: The cavity size was below normal. Wall thickness   was increased in a pattern of moderate LVH. There was concentric   hypertrophy. Systolic function was hyperdynamic. The estimated   ejection fraction was in the range of 75% to 80%. There was   dynamic obstruction at restin the mid cavity, with mid-cavity   obliteration, a peak velocity of 260 cm/sec, and a peak gradient   of 27 mm Hg. Doppler parameters are consistent with abnormal left   ventricular relaxation (grade 1 diastolic dysfunction).  Impressions:  - Suspect hypovolemia.   Assessment and plan discussed with with attending physician and they are in agreement.    Sandra Quill PA-C [facility] [phone]  07/08/2017, 3:29 PM   Assessment: 62 y.o. female with multiple medical problems while in the hospital including DKA, hypertension, hypovolemia, encephalopathy and now presenting with right-sided weakness secondary to subacute stroke.  These are in the distribution of the MCA which is suspicious for an embolus. She has AKI and therefore will hold off on contrast load, do MRA/Dopplers.  Stroke Risk Factors - diabetes mellitus and hypertension  Recommend --  HgbA1c, fasting lipid panel --MRI/MRA of the brain without contrast --PT consult, OT consult,  Speech consult --Echocardiogram --80 mg of Atorvistatin --Prophylactic therapy-Antiplatelet med: -- continue ASA --Telemetry monitoring --Frequent neuro checks --NPO until passes stroke swallow screen --please page stroke NP  Or  PA  Or MD from 8am -4 pm  as this patient from this time will be  followed by the stroke.   You can look them up on [email or web address]  Password [email or web address]   Roland Rack, MD [facility] [phone]  If 7pm- 7am, please page neurology on call as listed in [place].

## 2017-07-08 NOTE — Progress Notes (Signed)
CT report resulted to Jennelle Human NP and Dr.Argawala.

## 2017-07-08 NOTE — Progress Notes (Signed)
CRITICAL VALUE ALERT  Critical Value:  Lactic Acid=2.3 and Na= 161  Date & Time Notied:  07/08/17 @ 0425  Provider Notified: Leanora Ivanoff RN   Orders Received/Actions taken: Awaiting further orders

## 2017-07-08 NOTE — Progress Notes (Signed)
Patient answers some questions appropriately then she starts repeating phrases that you ask her .Right grip is slightly weaker than left no drift noted.slight right facial droop with smile.Sandra Human NP informed at  Bedside . CT of head ordered

## 2017-07-09 ENCOUNTER — Other Ambulatory Visit (HOSPITAL_COMMUNITY): Payer: Self-pay

## 2017-07-09 ENCOUNTER — Encounter (HOSPITAL_COMMUNITY): Payer: Self-pay

## 2017-07-09 ENCOUNTER — Inpatient Hospital Stay (HOSPITAL_COMMUNITY): Payer: Medicaid Other

## 2017-07-09 DIAGNOSIS — I6330 Cerebral infarction due to thrombosis of unspecified cerebral artery: Secondary | ICD-10-CM

## 2017-07-09 DIAGNOSIS — E1110 Type 2 diabetes mellitus with ketoacidosis without coma: Secondary | ICD-10-CM

## 2017-07-09 LAB — GLUCOSE, CAPILLARY
GLUCOSE-CAPILLARY: 89 mg/dL (ref 70–99)
GLUCOSE-CAPILLARY: 202 mg/dL — AB (ref 70–99)
Glucose-Capillary: 104 mg/dL — ABNORMAL HIGH (ref 70–99)
Glucose-Capillary: 97 mg/dL (ref 70–99)
Glucose-Capillary: 159 mg/dL — ABNORMAL HIGH (ref 70–99)

## 2017-07-09 LAB — LIPID PANEL
Cholesterol: 180 mg/dL (ref 0–200)
HDL: 22 mg/dL — AB (ref >40)
LDL CALC: 82 mg/dL (ref 0–99)
Total CHOL/HDL Ratio: 8.2 RATIO
Triglycerides: 380 mg/dL — ABNORMAL HIGH (ref <150)
VLDL: 76 mg/dL — ABNORMAL HIGH (ref 0–40)

## 2017-07-09 LAB — CBC
HCT: 39.1 % (ref 36.0–46.0)
Hemoglobin: 12.2 g/dL (ref 12.0–15.0)
MCH: 27.9 pg (ref 26.0–34.0)
MCHC: 31.2 g/dL (ref 30.0–36.0)
MCV: 89.5 fL (ref 78.0–100.0)
PLATELETS: 102 10*3/uL — AB (ref 150–400)
RBC: 4.37 MIL/uL (ref 3.87–5.11)
RDW: 14 % (ref 11.5–15.5)
WBC: 12.4 10*3/uL — ABNORMAL HIGH (ref 4.0–10.5)

## 2017-07-09 LAB — MAGNESIUM: Magnesium: 1.8 mg/dL (ref 1.7–2.4)

## 2017-07-09 LAB — RENAL FUNCTION PANEL
Albumin: 2.2 g/dL — ABNORMAL LOW (ref 3.5–5.0)
Anion gap: 7 (ref 5–15)
BUN: 15 mg/dL (ref 8–23)
CALCIUM: 8.1 mg/dL — AB (ref 8.9–10.3)
CO2: 26 mmol/L (ref 22–32)
CREATININE: 1.12 mg/dL — AB (ref 0.44–1.00)
Chloride: 117 mmol/L — ABNORMAL HIGH (ref 98–111)
GFR calc non Af Amer: 51 mL/min — ABNORMAL LOW (ref >60)
GFR, EST AFRICAN AMERICAN: 59 mL/min — AB (ref >60)
GLUCOSE: 124 mg/dL — AB (ref 70–99)
PHOSPHORUS: 2.1 mg/dL — AB (ref 2.5–4.6)
Potassium: 2.9 mmol/L — ABNORMAL LOW (ref 3.5–5.1)
SODIUM: 150 mmol/L — AB (ref 135–145)

## 2017-07-09 MED ORDER — HEPARIN SODIUM (PORCINE) 5000 UNIT/ML IJ SOLN
5000.0000 [IU] | Freq: Three times a day (TID) | INTRAMUSCULAR | Status: DC
  Administered 2017-07-09 – 2017-07-13 (×10): 5000 [IU] via SUBCUTANEOUS
  Filled 2017-07-09 (×10): qty 1

## 2017-07-09 MED ORDER — INSULIN ASPART 100 UNIT/ML ~~LOC~~ SOLN: 0.0000 [IU] | SUBCUTANEOUS | Status: DC

## 2017-07-09 MED ORDER — INSULIN GLARGINE 100 UNIT/ML ~~LOC~~ SOLN
10.0000 [IU] | Freq: Every day | SUBCUTANEOUS | Status: DC
  Administered 2017-07-09 – 2017-07-12 (×4): 10 [IU] via SUBCUTANEOUS
  Filled 2017-07-09 (×4): qty 0.1

## 2017-07-09 MED ORDER — INSULIN ASPART 100 UNIT/ML ~~LOC~~ SOLN: 0.0000 [IU] | Freq: Every day | SUBCUTANEOUS | Status: DC

## 2017-07-09 MED ORDER — POTASSIUM CHLORIDE 10 MEQ/100ML IV SOLN
10.0000 meq | INTRAVENOUS | Status: DC
  Administered 2017-07-09: 10 meq via INTRAVENOUS
  Filled 2017-07-09: qty 100

## 2017-07-09 MED ORDER — POTASSIUM CHLORIDE CRYS ER 20 MEQ PO TBCR
40.0000 meq | EXTENDED_RELEASE_TABLET | Freq: Once | ORAL | Status: AC
Start: 2017-07-09 — End: 2017-07-09
  Administered 2017-07-09: 40 meq via ORAL
  Filled 2017-07-09: qty 2

## 2017-07-09 MED ORDER — MAGNESIUM SULFATE 2 GM/50ML IV SOLN
2.0000 g | Freq: Once | INTRAVENOUS | Status: AC
  Administered 2017-07-09: 2 g via INTRAVENOUS
  Filled 2017-07-09: qty 50

## 2017-07-09 MED ORDER — ROSUVASTATIN CALCIUM 20 MG PO TABS
40.0000 mg | ORAL_TABLET | Freq: Every day | ORAL | Status: DC
  Administered 2017-07-09 – 2017-07-12 (×4): 40 mg via ORAL
  Filled 2017-07-09 (×4): qty 2

## 2017-07-09 MED ORDER — ASPIRIN 81 MG PO CHEW
81.0000 mg | CHEWABLE_TABLET | Freq: Every day | ORAL | Status: DC
  Administered 2017-07-09 – 2017-07-13 (×5): 81 mg via ORAL
  Filled 2017-07-09 (×5): qty 1

## 2017-07-09 MED ORDER — AMOXICILLIN-POT CLAVULANATE 875-125 MG PO TABS
1.0000 | ORAL_TABLET | Freq: Two times a day (BID) | ORAL | Status: AC
  Administered 2017-07-09 – 2017-07-12 (×8): 1 via ORAL
  Filled 2017-07-09 (×9): qty 1

## 2017-07-09 MED ORDER — CLOPIDOGREL BISULFATE 75 MG PO TABS
75.0000 mg | ORAL_TABLET | Freq: Every day | ORAL | Status: DC
  Administered 2017-07-09 – 2017-07-13 (×5): 75 mg via ORAL
  Filled 2017-07-09 (×5): qty 1

## 2017-07-09 MED ORDER — INSULIN ASPART 100 UNIT/ML ~~LOC~~ SOLN
0.0000 [IU] | SUBCUTANEOUS | Status: DC
  Administered 2017-07-09: 5 [IU] via SUBCUTANEOUS

## 2017-07-09 MED ORDER — INSULIN ASPART 100 UNIT/ML ~~LOC~~ SOLN
0.0000 [IU] | Freq: Three times a day (TID) | SUBCUTANEOUS | Status: DC
Start: 2017-07-10 — End: 2017-07-13
  Administered 2017-07-10: 8 [IU] via SUBCUTANEOUS
  Administered 2017-07-10 (×2): 3 [IU] via SUBCUTANEOUS
  Administered 2017-07-11: 5 [IU] via SUBCUTANEOUS
  Administered 2017-07-12: 8 [IU] via SUBCUTANEOUS
  Administered 2017-07-12 – 2017-07-13 (×4): 3 [IU] via SUBCUTANEOUS

## 2017-07-09 MED ORDER — AMLODIPINE BESYLATE 5 MG PO TABS
5.0000 mg | ORAL_TABLET | Freq: Every day | ORAL | Status: DC
  Administered 2017-07-09 – 2017-07-13 (×5): 5 mg via ORAL
  Filled 2017-07-09 (×5): qty 1

## 2017-07-09 NOTE — Evaluation (Signed)
Speech Language Pathology Evaluation Patient Details Name: Sandra Shelton MRN: [id number] DOB: 1955/05/22 Today's Date: 07/09/2017 Time: 4098-1191 SLP Time Calculation (min) (ACUTE ONLY): 35 min  Problem List:  Patient Active Problem List   Diagnosis Date Noted  . Cerebral thrombosis with cerebral infarction 07/08/2017  . DKA, type 2 ([place]) 07/06/2017  . DKA (diabetic ketoacidoses) ([place]) 07/06/2017  . Metabolic acidosis   . Acute renal failure ([place])   . Dehydration    Past Medical History:  Past Medical History:  Diagnosis Date  . Hypertension    Past Surgical History:  Past Surgical History:  Procedure Laterality Date  . UTERINE FIBROID SURGERY     HPI:  Presents to ED on 6/21 after being found obtunded at home. Sister states that she had spoke to patient hours before on phone and she said that she didn't feel good and could not get out of bed. Family reports fatigue, weight loss, polydipsia, and recent dental abscess. EMS reports that patient was non-responsive and glucose read HIGH MRI head on 07/09/17 indicated Patchy multifocal acute to early subacute ischemic nonhemorrhagic left MCA territory infarcts.     Assessment / Plan / Recommendation Clinical Impression   Pt administered MOCA (Montreal Cognitive Assessment) which yielded a score of 13/30 with a typical score in normal range being 26/30. Pt oriented x4, but deficits noted in areas of pragmatics (flat affect), delayed overall processing information with most tasks, decreased sustained attention, deficits in problem solving/reasoning and memory recall/naming with divergent/convergent and confrontational naming tasks.  Pt used neologisms and perseverations within simple conversational tasks. Speech was intelligible, but deliberate during simple conversation.  Pt unable to complete certain visuospatial tasks, but this may be related to glasses being unavailable during assessment.  ST will f/u for speech/language/cognitive  deficits and on-going assessment re: reading/writing tasks.    SLP Assessment  SLP Recommendation/Assessment: Patient needs continued Speech Language Pathology Services SLP Visit Diagnosis: Dysphagia, oropharyngeal phase (R13.12);Aphasia (R47.01);Cognitive communication deficit (R41.841)    Follow Up Recommendations  Other (comment)(TBD)    Frequency and Duration min 2x/week  1 week      SLP Evaluation Cognition  Overall Cognitive Status: Impaired/Different from baseline Arousal/Alertness: Awake/alert Orientation Level: Oriented X4 Attention: Sustained Sustained Attention: Impaired Sustained Attention Impairment: Verbal basic;Functional basic Memory: Impaired Memory Impairment: Retrieval deficit;Decreased recall of new information;Decreased short term memory Decreased Short Term Memory: Verbal basic;Functional basic Awareness: Appears intact Problem Solving: Impaired Problem Solving Impairment: Verbal basic;Functional basic Executive Function: Organizing;Reasoning Reasoning: Impaired Reasoning Impairment: Verbal basic;Functional basic Organizing: Impaired Organizing Impairment: Verbal basic;Functional basic Behaviors: Perseveration;Other (comment)(flat affect) Safety/Judgment: Impaired       Comprehension  Auditory Comprehension Overall Auditory Comprehension: Appears within functional limits for tasks assessed Yes/No Questions: Within Functional Limits Commands: Within Functional Limits Conversation: Simple Interfering Components: Attention;Processing speed;Working Field seismologist: Public house manager: Not tested Reading Comprehension Reading Status: Not tested(no glasses available)    Expression Expression Primary Mode of Expression: Verbal Verbal Expression Overall Verbal Expression: Impaired Level of Generative/Spontaneous Verbalization: Conversation Repetition:  Impaired Level of Impairment: Sentence level Naming: Impairment Responsive: 51-75% accurate Confrontation: Impaired Convergent: 50-74% accurate Divergent: 25-49% accurate Verbal Errors: Perseveration;Not aware of errors;Neologisms Pragmatics: Impairment Impairments: Abnormal affect Non-Verbal Means of Communication: Not applicable Written Expression Dominant Hand: Right Written Expression: Unable to assess (comment)(attempted, but pt did not have glasses)   Oral / Motor  Oral Motor/Sensory Function Overall Oral Motor/Sensory Function: Mild impairment Facial ROM: Reduced right Facial Symmetry: Abnormal symmetry right Facial  Strength: Reduced right Facial Sensation: Reduced right Lingual ROM: Reduced right Lingual Symmetry: Abnormal symmetry right Lingual Strength: Reduced Mandible: Impaired Motor Speech Overall Motor Speech: Appears within functional limits for tasks assessed Respiration: Within functional limits Phonation: Normal Resonance: Within functional limits Articulation: Within functional limitis Intelligibility: Intelligible Motor Planning: Witnin functional limits Motor Speech Errors: Not applicable                       Elvina Sidle, M.S., CCC-SLP 07/09/2017, 1:00 PM

## 2017-07-09 NOTE — Evaluation (Signed)
Clinical/Bedside Swallow Evaluation Patient Details  Name: Sandra Shelton MRN: [id number] Date of Birth: [birth year]  Today's Date: 07/09/2017 Time: SLP Start Time (ACUTE ONLY): 0935 SLP Stop Time (ACUTE ONLY): 1010 SLP Time Calculation (min) (ACUTE ONLY): 35 min  Past Medical History:  Past Medical History:  Diagnosis Date  . Hypertension    Past Surgical History:  Past Surgical History:  Procedure Laterality Date  . UTERINE FIBROID SURGERY     HPI:  62 yo female with pmhx significant for HTN, presented to ED on 06/16/17 after being found obtunded at home. Sister states that she had spoke to patient hours before on phone and she said that she didn't feel good and could not get out of bed. Family reports fatigue, weight loss, polydipsia, and recent dental abscess. EMS reports that patient was non-responsive and glucose read HIGH; MRI head on 07/09/17 indicated Patchy multifocal acute to early subacute ischemic nonhemorrhagic left MCA territory infarcts. No significant mass effect; pt currently NPO requiring BSE for PO intake. CXR on 07/06/17 indicated Stable appearance of the chest. No pneumothorax or other complication status post attempted central line placement; negative for acute processes.  Assessment / Plan / Recommendation Clinical Impression   Pt presents with mild oropharyngeal dysphagia characterized by right hemiparesis with subsequent decreased oral retention on right with anterior loss noted, decreased sensory (facial/labial) awareness on right, reduced oral propulsion/transit/manipulation with all consistencies and impaired mastication with solids with right lingual sweep required after intake; no overt s/s of aspiration noted during intake; therefore, Dysphagia 3 (mechanical soft)/thin liquid diet recommended with swallowing/aspiration precautions in place for right lingual sweep with meals, small bites/sips, straws allowed with small sips, slow rate and intermittent supervision for  compensatory strategies required; ST will f/u while in acute setting for dysphagia tx/diet tolerance in conjunction with cognitive/speech/language deficits noted in SLE. SLP Visit Diagnosis: Dysphagia, oropharyngeal phase (R13.12)    Aspiration Risk  Mild aspiration risk    Diet Recommendation   Dysphagia 3 (mechanical soft)/thin liquids  Medication Administration: Whole meds with puree    Other  Recommendations Oral Care Recommendations: Oral care BID   Follow up Recommendations Other (comment)(TBD)      Frequency and Duration min 2x/week  1 week       Prognosis Prognosis for Safe Diet Advancement: Good Barriers to Reach Goals: Cognitive deficits      Swallow Study   General Date of Onset: 07/05/17 HPI: Presents to ED on 6/21 after being found obtunded at home. Sister states that she had spoke to patient hours before on phone and she said that she didn't feel good and could not get out of bed. Family reports fatigue, weight loss, polydipsia, and recent dental abscess. EMS reports that patient was non-responsive and glucose read HIGH Type of Study: Bedside Swallow Evaluation Diet Prior to this Study: NPO Temperature Spikes Noted: Yes Respiratory Status: Room air History of Recent Intubation: No Behavior/Cognition: Alert;Cooperative;Requires cueing;Other (Comment)(Flat affect) Oral Cavity Assessment: Dry Oral Care Completed by SLP: Yes Oral Cavity - Dentition: Adequate natural dentition Vision: Functional for self-feeding Self-Feeding Abilities: Able to feed self;Needs assist Patient Positioning: Upright in bed Baseline Vocal Quality: Low vocal intensity Volitional Cough: Weak Volitional Swallow: Able to elicit    Oral/Motor/Sensory Function Overall Oral Motor/Sensory Function: Mild impairment Facial ROM: Reduced right Facial Symmetry: Abnormal symmetry right Facial Strength: Reduced right Facial Sensation: Reduced right Lingual ROM: Reduced right Lingual Symmetry:  Abnormal symmetry right Lingual Strength: Reduced Mandible: Impaired   Ice Chips Ice  chips: Impaired Presentation: Spoon Oral Phase Impairments: Reduced lingual movement/coordination Oral Phase Functional Implications: Prolonged oral transit   Thin Liquid Thin Liquid: Impaired Presentation: Cup;Spoon;Straw Oral Phase Functional Implications: Right anterior spillage Pharyngeal  Phase Impairments: Multiple swallows;Other (comments)(with larger boluses)    Nectar Thick Nectar Thick Liquid: Not tested   Honey Thick Honey Thick Liquid: Not tested   Puree Puree: Impaired Presentation: Self Fed Oral Phase Impairments: Reduced lingual movement/coordination   Solid      Solid: Impaired Presentation: Self Fed Oral Phase Impairments: Reduced lingual movement/coordination;Impaired mastication Oral Phase Functional Implications: Prolonged oral transit;Impaired mastication        Elvina Sidle, M.S., CCC-SLP 07/09/2017,12:40 PM

## 2017-07-09 NOTE — Progress Notes (Addendum)
PULMONARY / CRITICAL CARE MEDICINE   Name: Sandra Shelton MRN: [id number] DOB: [birth year]    ADMISSION DATE:  07/05/2017 CONSULTATION DATE:  07/06/2017  REFERRING MD:  Dr. Hal Hope   CHIEF COMPLAINT:  DKA  HISTORY OF PRESENT ILLNESS:   62 year old female with PMH of HTN  Presents to ED on 6/21 after being found obtunded at home. Sister states that she had spoke to patient hours before on phone and she said that she didn't feel good and could not get out of bed. Family reports fatigue, weight loss, polydipsia, and recent dental abscess. EMS reports that patient was non-responsive and glucose read HIGH. Upon arrival to ED BP 76/50. Afebrile, Tachycardiac. LA 10. WBC 18.2. Temp 98.5. Glucose 1265. PCCM asked to admit.   SUBJECTIVE:   No events overnight, hemodynamically stable on room air tmax 100.4, improving PCT/ WBC Remains somnolent  Na improved 161 -> 156-> 150 Patient complains of feeling very tired, denies pain or SOB  VITAL SIGNS: BP (!) 166/57 (BP Location: Left Arm)   Pulse 72   Temp (!) 97.5 F (36.4 C) (Axillary)   Resp 18   Ht 5\' 6"  (1.676 m)   Wt 176 lb 5.9 oz (80 kg)   SpO2 94%   BMI 28.47 kg/m   HEMODYNAMICS:    VENTILATOR SETTINGS:    INTAKE / OUTPUT: I/O last 3 completed shifts: In: 5306.3 [I.V.:3757.5; IV Piggyback:1548.8] Out: 5360 [Urine:5360]  PHYSICAL EXAMINATION: General:  Adult female lying in bed  HEENT: MM pink/moist, pupils 4/ reactive, soft voice, slight right facial weakness Neuro: somnolent, awakens to voice, slowed responses, f/c, oriented x 3, equal grips/ MAE CV: rrr, no m/r/g PULM: even/non-labored, lungs bilaterally clear anteriorly, diminished in bases GI: obese, soft, non-tender, bs active  Extremities: warm/dry, no edema  Skin: no rashes  LABS:  BMET Recent Labs  Lab 07/07/17 1547 07/08/17 0251 07/08/17 1539 07/09/17 0646  NA 155* 161* 156* 150*  K 4.2 3.1*  --  2.9*  CL 124* 128*  --  117*  CO2 19* 23  --  26   BUN 49* 36*  --  15  CREATININE 2.25* 1.83*  --  1.12*  GLUCOSE 355* 141*  --  124*    Electrolytes Recent Labs  Lab 07/06/17 0201  07/07/17 0312  07/07/17 1547 07/08/17 0251 07/09/17 0646  CALCIUM 9.2   < >  --    < > 8.6* 8.9 8.1*  MG 3.8*  --  2.9*  --   --   --  1.8  PHOS 1.2*  --   --   --   --   --  2.1*   < > = values in this interval not displayed.    CBC Recent Labs  Lab 07/07/17 0611 07/08/17 0251 07/09/17 0646  WBC 23.4* 18.5* 12.4*  HGB 13.2 13.8 12.2  HCT 40.9 43.2 39.1  PLT 149* 121* 102*    Coag's Recent Labs  Lab 07/05/17 2242  INR 1.23    Sepsis Markers Recent Labs  Lab 07/06/17 0201  07/06/17 1514 07/07/17 0312 07/08/17 0251  LATICACIDVEN  --    < > 5.2* 3.2* 2.3*  PROCALCITON 18.88  --   --  24.93 7.78   < > = values in this interval not displayed.    ABG Recent Labs  Lab 07/06/17 0140 07/06/17 0511  PHART 7.150* 7.258*  PCO2ART 22.7* 22.1*  PO2ART 131.0* 89.0    Liver Enzymes Recent Labs  Lab 07/05/17  2242 07/09/17 0646  AST 30  --   ALT 21  --   ALKPHOS 82  --   BILITOT 1.4*  --   ALBUMIN 4.0 2.2*    Cardiac Enzymes Recent Labs  Lab 07/06/17 0643 07/06/17 1346 07/06/17 1514  TROPONINI 0.10* 0.20* 0.39*    Glucose Recent Labs  Lab 07/08/17 1151 07/08/17 1547 07/08/17 1909 07/08/17 2339 07/09/17 0308 07/09/17 0751  GLUCAP 147* 101* 95 93 89 104*    STUDIES:  CXR 6/21 > No acute cardiopulmonary abnormality  CT Head 6/22 > No acute. Mild chronic atrophy   2D echo June 22>> moderate LVH, EF 75 to 44%, grade 1 diastolic dysfunction, suspected hypovolemia, dynamic obstruction , mid cavity obliteration  6/24 CTH and maxillofacial >>  1. 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 No hemorrhage or mass effect. 2. Mild atrophy and chronic small-vessel white matter ischemic changes. 3. No significant facial abnormality.  6/25 MRI brain >> 1. Patchy multifocal  acute to early subacute ischemic nonhemorrhagic left MCA territory infarcts. No significant mass effect. 2. Otherwise normal brain MRI for age.  MRA brain >> 1. Diminutive left ICA as compared to the right, with overall decreased and attenuated flow within the left MCA distribution. Finding of unknown chronicity, although further evaluation with vascular imaging of the neck is suggested to fully evaluate these findings. 2. Mild atherosclerotic change involving the intracranial circulation for age. No proximal high-grade or correctable stenosis identified.  CULTURES: Blood 6/21 >> ngtd x 2  U/A 6/22 >> neg 6/22 MRSA PCR >>  ANTIBIOTICS: Zosyn 6/22 >>6/23 Vancomycin 6/22  Clindamycin 6/23 >>6/25 Augmenting 6/25 >>  SIGNIFICANT EVENTS: 6/21 > Presents to ED   6/22 > off insulin gtt 6/24 > minimal R weakness/ CTH positive for CVA, hypernatremic 6/25 > tx tele  LINES/TUBES: PIV   DISCUSSION: 62 year old female presents AMS secondary to DKA, continued AMS after resolution of DKA  ASSESSMENT / PLAN:  Respiratory Insufficieny in setting of DKA  - currently protecting airway Plan  - Maintain Oxygen Saturation >92%, currently on RA - pending SLP - pulm hygiene, mobilize  Hypotension in setting of hypovolemia > resolved H/O HTN Echo showed systolic function hyperdynamic with a EF of 75 to 80%, moderate LVH, grade 1 diastolic dysfunction HLD  Plan  -Cardiac Monitoring  -Maintain MAP >65 - prn apresoline - start Norvasc 5 mg daily  - resume crestor  when able   Severe Anion Gap Metabolic Acidosis/ hyperchloremic with Lactic Acidosis secondary to DKA> improving, Gap closed/ lactate improving Hypernatremia/hyperchloremia - improving  AKI-improving  Plan  - d/c IVF  - monitor for urinary retention  - Trend BMP/ mag - Replace electrolytes as indicated  DKA  - beta-hydroxybutyric acid > 8 DM - new diagnosed - Hgb A1c > 15.5 Plan  - CBG q 4- decrease to moderate  SSI - decrease Lantus to 10 units daily - will need to readdress SSI/lantus dosing once taking PO's - diabetes coordinator to consult  Leucocytosis  - unclear etiology given neg CT maxillofacial neg, CXR neg ( 6/22), UA/ UC neg, BC neg thus far Recent Dental Abscess Thrombocytopenia - ? R/t sepsis, plt drop/ timing does not c/w HIT - 6/21 329, (6/23) 149 -> 121 -> 102 -> Plan  - trend CBC - monitor for bleeding - trend PCT (improving) - follow BC - d/c clindamycin, change to Augmentin x days - heparin SQ/ SCDs for DVT ppx  Metabolic Encephalopathy -  Left CVA- new 6/24 w/resolved Right sided weakness  - TSH 0.496 Plan  - Monitor/ neuro exams  - d/c sitter- no indication - appreciate Neurology assistance  - pending carotid ultrasound  - crestor/ ASA added - SLP consult- passed, will resume diet and PO meds - PT/ OT evaluations - fall risk    FAMILY  - Updates: Family updated at bedside 6/24 afternoon.  No family present 6/25 am.  Global: Patient remains hemodynamically stable on room, is somnolent but oriented. Na is much improved.  Can transfer to telemetry and [facility] service as of 6/26, PCCM sign off, will be available as needed.   - Inter-disciplinary family meet or Palliative Care meeting due by:  07/13/2017    Kennieth Rad, AGACNP-BC [facility] Pgr: [phone] or if no answer [phone] 07/09/2017, 8:59 AM

## 2017-07-09 NOTE — Progress Notes (Signed)
New Admission Note:   Arrival Method: wheelchair from 74m Mental Orientation: alert and oriented x4  Telemetry: Assessment: Completed Skin: intact  IV: Left hand  Pain: 0/10  Tubes: Safety Measures: Safety Fall Prevention Plan has been given, discussed and signed Admission: Completed [facility] Orientation: Patient has been orientated to the room, unit and staff.  Family: Niece   Orders have been reviewed and implemented. Will continue to monitor the patient. Call light has been placed within reach and bed alarm has been activated.   Colletta Spillers RN [facility] Phone: [phone]

## 2017-07-09 NOTE — Progress Notes (Signed)
Report called to [id number]

## 2017-07-09 NOTE — Progress Notes (Signed)
Inpatient Diabetes Program Recommendations  AACE/ADA: New Consensus Statement on Inpatient Glycemic Control (2015)  Target Ranges:  Prepandial:   less than 140 mg/dL      Peak postprandial:   less than 180 mg/dL (1-2 hours)      Critically ill patients:  140 - 180 mg/dL   Lab Results  Component Value Date   GLUCAP 97 07/09/2017   HGBA1C >15.5 (H) 07/06/2017    Review of Glycemic Control Results for Sandra Shelton, Sandra Shelton (MRN [id number]) as of 07/09/2017 15:01  Ref. Range 07/08/2017 19:09 07/08/2017 23:39 07/09/2017 03:08 07/09/2017 07:51 07/09/2017 10:58  Glucose-Capillary Latest Ref Range: 70 - 99 mg/dL 95 93 89 104 (H) 97   Diabetes history: New onset Outpatient Diabetes medications: none Current orders for Inpatient glycemic control: Novolog 0-15 units Q4H, Lantus 10 units QD  Inpatient Diabetes Program Recommendations:    Spoke with patient and niece at bedside regarding new diagnosis of DM. Patient plans to live with family following discharge, who plan to help her recover and care for her.   Reviewed patient's current A1c of >15.5%. Explained what a A1c is and what it measures. Also reviewed goal A1c with patient, importance of good glucose control @ home, and blood sugar goals. Reviewed basic patho of DM, need for insulin, DKA, how acid builds up in body when there is not enough insulin, vascular changes associated with increased A1C, co-morbidies associated, need for exogenous insulin, and signs and symptoms of DKA.   Goals for today's discussion consisted of: -A1C and understanding this lab value - Understanding need for insulin  - Begin thinking about self injections  Patient verbalized financial concerns because of her lack of insurance. Provided handout for [employer] products and reviewed pricing and what supplies that she would need to include: meter, lancets, supplies, and vial and syringe vs. Insulin pens. Patient's niece verbalized that this would be affordable.  Demonstrated both  the vial and syringe and insulin pens with pen needles. Discussed pros and cons of each and designated safety features of the pen. Patient is leaning towards insulin pen because of safety.   Planning to meet family again on 6/26 @1000  to further demonstrate injections, discussion of survival skills and provide hand outs for outpatient eduation. Will place consult for dietitian.  Thanks, Bronson Curb, MSN, RNC-OB Diabetes Coordinator [phone] (8a-5p)

## 2017-07-09 NOTE — Progress Notes (Signed)
[facility] Progress Note Patient Name: Sandra Shelton DOB: 1955/03/06 MRN: [id number]   Date of Service  07/09/2017  HPI/Events of Note  Hyperglycemia - Patient is eating a diet and is on Q 4 hours Novolog SSI + Lantus Q day.  eICU Interventions  Will change to AC/HS moderate Novolog SSI.      Intervention Category Major Interventions: Hyperglycemia - active titration of insulin therapy  Lysle Dingwall 07/09/2017, 9:26 PM

## 2017-07-09 NOTE — Progress Notes (Addendum)
STROKE TEAM PROGRESS NOTE  HPI ( Dr Leonel Ramsay ) Sandra Shelton is an 62 y.o. female with past medical history of hypertension.  Patient presented to the ED on 6/21 after being found obtunded at home.  Apparently sister had spoken to her hours before and the phone and patient stated that she did not feel good.  She said that she could not get out of bed.  At time of EMS and ED report patient reported feeling fatigue, weight loss, polydipsia and recent dental abscess.  When patient was seen by EMS it was noted that she was nonresponsive with a glucose that was read as high in the ED found to be 1265.  She also had a blood pressure of 76/50 while in the ED.  She was tachycardic with WBC of 18.2 and temperature of 98.5.  Patient was admitted to the hospital for DKA, hypotension in the setting of hypovolemia, severe anion gap metabolic acidosis/hyperchloremia along with leukocytosis and thrombocytopenia.  Seen assessment the nurse noted that her right grip was weaker than her left.  She also noted a right facial droop.  For that reason a CT of head was ordered.  CT of head did show positive for subacute left frontal and left caudate infarcts.  For this reason neurology was consulted.  Date last known well: Unable to determine Time last known well: Unable to determine tPA Given: No: unable to determine LSW NIHSS 2 Modified Rankin: Rankin Score=0    INTERVAL HISTORY No family is at the bedside.  She states her sister found her down. She has not had a stroke before.his status, right-sided weakness is improving.  Vitals:   07/09/17 0755 07/09/17 0800 07/09/17 0900 07/09/17 1000  BP:  (!) 166/57 128/72 (!) 133/49  Pulse:  72 70 68  Resp:  18 (!) 21 17  Temp: (!) 97.5 F (36.4 C)     TempSrc: Axillary     SpO2:  94% 98% 97%  Weight:      Height:        CBC:  Recent Labs  Lab 07/05/17 2242  07/08/17 0251 07/09/17 0646  WBC 18.2*   < > 18.5* 12.4*  NEUTROABS 16.3*  --   --   --   HGB  18.4*   < > 13.8 12.2  HCT 64.5*   < > 43.2 39.1  MCV 100.0   < > 88.3 89.5  PLT 329   < > 121* 102*   < > = values in this interval not displayed.    Basic Metabolic Panel:  Recent Labs  Lab 07/06/17 0201  07/07/17 0312  07/08/17 0251 07/08/17 1539 07/09/17 0646  NA 159*   < >  --    < > 161* 156* 150*  K 2.6*   < >  --    < > 3.1*  --  2.9*  CL 124*   < >  --    < > 128*  --  117*  CO2 16*   < >  --    < > 23  --  26  GLUCOSE 662*   < >  --    < > 141*  --  124*  BUN 90*   < >  --    < > 36*  --  15  CREATININE 4.13*   < >  --    < > 1.83*  --  1.12*  CALCIUM 9.2   < >  --    < >  8.9  --  8.1*  MG 3.8*  --  2.9*  --   --   --  1.8  PHOS 1.2*  --   --   --   --   --  2.1*   < > = values in this interval not displayed.   Lipid Panel:     Component Value Date/Time   CHOL 180 07/09/2017 0646   TRIG 380 (H) 07/09/2017 0646   HDL 22 (L) 07/09/2017 0646   CHOLHDL 8.2 07/09/2017 0646   VLDL 76 (H) 07/09/2017 0646   LDLCALC 82 07/09/2017 0646   HgbA1c:  Lab Results  Component Value Date   HGBA1C >15.5 (H) 07/06/2017   Urine Drug Screen:     Component Value Date/Time   LABOPIA NONE DETECTED 07/05/2017 2349   COCAINSCRNUR NONE DETECTED 07/05/2017 2349   LABBENZ NONE DETECTED 07/05/2017 2349   AMPHETMU NONE DETECTED 07/05/2017 2349   THCU NONE DETECTED 07/05/2017 2349   LABBARB (A) 07/05/2017 2349    Result not available. Reagent lot number recalled by manufacturer.    Alcohol Level     Component Value Date/Time   ETH <10 07/05/2017 2242    IMAGING Ct Head Wo Contrast  Result Date: 07/08/2017 CLINICAL DATA:  62 year old female with altered mental status and RIGHT facial pain and swelling. EXAM: CT HEAD WITHOUT CONTRAST CT MAXILLOFACIAL WITHOUT CONTRAST TECHNIQUE: Multidetector CT imaging of the head and maxillofacial structures were performed using the standard protocol without intravenous contrast. Multiplanar CT image reconstructions of the maxillofacial  structures were also generated. COMPARISON:  07/05/2017 head CT FINDINGS: CT HEAD FINDINGS Brain: Two small to moderate LEFT frontal infarcts and a LEFT caudate infarct appears acute to subacute and new on CT since 07/05/2017. There is no evidence of hemorrhage or mass effect. Atrophy and mild chronic small-vessel white matter ischemic changes are again noted. No midline shift, hydrocephalus or extra-axial collection identified. Vascular: Atherosclerotic calcifications again noted. Skull: Normal. Negative for fracture or focal lesion. Other: None. CT MAXILLOFACIAL FINDINGS Osseous: No fracture or mandibular dislocation. No destructive process. Orbits: Negative. No traumatic or inflammatory finding. Sinuses: Clear. Soft tissues: Negative. IMPRESSION: 1. 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 No hemorrhage or mass effect. 2. Mild atrophy and chronic small-vessel white matter ischemic changes. 3. No significant facial abnormality. Electronically Signed   By: Margarette Canada M.D.   On: 07/08/2017 14:43   Mr Brain Wo Contrast  Result Date: 07/09/2017 CLINICAL DATA:  Initial evaluation for follow-up examination for acute stroke. EXAM: MRI HEAD WITHOUT CONTRAST MRA HEAD WITHOUT CONTRAST TECHNIQUE: Multiplanar, multiecho pulse sequences of the brain and surrounding structures were obtained without intravenous contrast. Angiographic images of the head were obtained using MRA technique without contrast. COMPARISON:  Prior CT from 07/08/2017. FINDINGS: MRI HEAD FINDINGS Brain: Cerebral volume within normal limits for age. No significant cerebral white matter changes. There are patchy multifocal acute to early subacute ischemic infarcts involving the cortical gray matter and underlying subcortical white matter of the left frontal and parietal temporal regions, consistent with left MCA territory infarcts. Patchy involvement of the caudate. No associated mass effect or evidence for  hemorrhage. No other acute or subacute ischemia. Gray-white matter differentiation otherwise maintained. No evidence for acute or chronic intracranial hemorrhage. No mass lesion, midline shift or mass effect. No hydrocephalus. No extra-axial fluid collection. Pituitary gland within normal limits. Vascular: Left ICA demonstrates a diminutive vascular flow void. Major intracranial vascular flow voids otherwise  maintained. Skull and upper cervical spine: Craniocervical junction within normal limits. Upper cervical spine normal. Bone marrow signal intensity normal. No scalp soft tissue abnormality. Sinuses/Orbits: Globes orbital soft tissues within normal limits. Mild scattered mucosal thickening within the ethmoidal air cells. Paranasal sinuses are otherwise clear. No mastoid effusion. Other: None. MRA HEAD FINDINGS ANTERIOR CIRCULATION: Examination somewhat degraded by motion artifact. The left ICA is diminutive to the level of the terminus, of uncertain chronicity. Possible superimpose mild stenosis at the distal cervical left ICA. Right ICA patent to the terminus without hemodynamically significant stenosis. Mild carotid siphon atherosclerotic change. A1 segments, anterior communicating artery common anterior cerebral arteries patent without stenosis. M1 segments patent without stenosis or occlusion. There is overall decreased attenuation throughout the left MCA territory as compared to the right. No proximal M2 occlusion. Mild distal small vessel atheromatous irregularity within the MCA branches, most notable on the left. POSTERIOR CIRCULATION: Dominant left vertebral artery patent to the vertebrobasilar junction. Hypoplastic right vertebral artery largely terminates in PICA, although a small branch ascending towards the vertebrobasilar junction. Right PICA patent. Left PICA not visualized. Basilar patent to its distal aspect without stenosis. Superior cerebellar arteries patent bilaterally. Short-segment moderate  proximal right P2 stenosis, with additional mild narrowing distally. Left CCA widely patent. No intracranial aneurysm. IMPRESSION: MRI HEAD IMPRESSION: 1. Patchy multifocal acute to early subacute ischemic nonhemorrhagic left MCA territory infarcts. No significant mass effect. 2. Otherwise normal brain MRI for age. MRA HEAD IMPRESSION: 1. Diminutive left ICA as compared to the right, with overall decreased and attenuated flow within the left MCA distribution. Finding of unknown chronicity, although further evaluation with vascular imaging of the neck is suggested to fully evaluate these findings. 2. Mild atherosclerotic change involving the intracranial circulation for age. No proximal high-grade or correctable stenosis identified. Electronically Signed   By: Jeannine Boga M.D.   On: 07/09/2017 06:29   Mr Jodene Nam Head Wo Contrast  Result Date: 07/09/2017 CLINICAL DATA:  Initial evaluation for follow-up examination for acute stroke. EXAM: MRI HEAD WITHOUT CONTRAST MRA HEAD WITHOUT CONTRAST TECHNIQUE: Multiplanar, multiecho pulse sequences of the brain and surrounding structures were obtained without intravenous contrast. Angiographic images of the head were obtained using MRA technique without contrast. COMPARISON:  Prior CT from 07/08/2017. FINDINGS: MRI HEAD FINDINGS Brain: Cerebral volume within normal limits for age. No significant cerebral white matter changes. There are patchy multifocal acute to early subacute ischemic infarcts involving the cortical gray matter and underlying subcortical white matter of the left frontal and parietal temporal regions, consistent with left MCA territory infarcts. Patchy involvement of the caudate. No associated mass effect or evidence for hemorrhage. No other acute or subacute ischemia. Gray-white matter differentiation otherwise maintained. No evidence for acute or chronic intracranial hemorrhage. No mass lesion, midline shift or mass effect. No hydrocephalus. No  extra-axial fluid collection. Pituitary gland within normal limits. Vascular: Left ICA demonstrates a diminutive vascular flow void. Major intracranial vascular flow voids otherwise maintained. Skull and upper cervical spine: Craniocervical junction within normal limits. Upper cervical spine normal. Bone marrow signal intensity normal. No scalp soft tissue abnormality. Sinuses/Orbits: Globes orbital soft tissues within normal limits. Mild scattered mucosal thickening within the ethmoidal air cells. Paranasal sinuses are otherwise clear. No mastoid effusion. Other: None. MRA HEAD FINDINGS ANTERIOR CIRCULATION: Examination somewhat degraded by motion artifact. The left ICA is diminutive to the level of the terminus, of uncertain chronicity. Possible superimpose mild stenosis at the distal cervical left ICA. Right ICA patent to the  terminus without hemodynamically significant stenosis. Mild carotid siphon atherosclerotic change. A1 segments, anterior communicating artery common anterior cerebral arteries patent without stenosis. M1 segments patent without stenosis or occlusion. There is overall decreased attenuation throughout the left MCA territory as compared to the right. No proximal M2 occlusion. Mild distal small vessel atheromatous irregularity within the MCA branches, most notable on the left. POSTERIOR CIRCULATION: Dominant left vertebral artery patent to the vertebrobasilar junction. Hypoplastic right vertebral artery largely terminates in PICA, although a small branch ascending towards the vertebrobasilar junction. Right PICA patent. Left PICA not visualized. Basilar patent to its distal aspect without stenosis. Superior cerebellar arteries patent bilaterally. Short-segment moderate proximal right P2 stenosis, with additional mild narrowing distally. Left CCA widely patent. No intracranial aneurysm. IMPRESSION: MRI HEAD IMPRESSION: 1. Patchy multifocal acute to early subacute ischemic nonhemorrhagic left MCA  territory infarcts. No significant mass effect. 2. Otherwise normal brain MRI for age. MRA HEAD IMPRESSION: 1. Diminutive left ICA as compared to the right, with overall decreased and attenuated flow within the left MCA distribution. Finding of unknown chronicity, although further evaluation with vascular imaging of the neck is suggested to fully evaluate these findings. 2. Mild atherosclerotic change involving the intracranial circulation for age. No proximal high-grade or correctable stenosis identified. Electronically Signed   By: Jeannine Boga M.D.   On: 07/09/2017 06:29   Ct Maxillofacial Wo Contrast  Result Date: 07/08/2017 CLINICAL DATA:  62 year old female with altered mental status and RIGHT facial pain and swelling. EXAM: CT HEAD WITHOUT CONTRAST CT MAXILLOFACIAL WITHOUT CONTRAST TECHNIQUE: Multidetector CT imaging of the head and maxillofacial structures were performed using the standard protocol without intravenous contrast. Multiplanar CT image reconstructions of the maxillofacial structures were also generated. COMPARISON:  07/05/2017 head CT FINDINGS: CT HEAD FINDINGS Brain: Two small to moderate LEFT frontal infarcts and a LEFT caudate infarct appears acute to subacute and new on CT since 07/05/2017. There is no evidence of hemorrhage or mass effect. Atrophy and mild chronic small-vessel white matter ischemic changes are again noted. No midline shift, hydrocephalus or extra-axial collection identified. Vascular: Atherosclerotic calcifications again noted. Skull: Normal. Negative for fracture or focal lesion. Other: None. CT MAXILLOFACIAL FINDINGS Osseous: No fracture or mandibular dislocation. No destructive process. Orbits: Negative. No traumatic or inflammatory finding. Sinuses: Clear. Soft tissues: Negative. IMPRESSION: 1. 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 No hemorrhage or mass effect. 2. Mild atrophy and chronic small-vessel white matter  ischemic changes. 3. No significant facial abnormality. Electronically Signed   By: Margarette Canada M.D.   On: 07/08/2017 14:43    PHYSICAL EXAM  Obese middle-aged African-American lady currently not in distress. . Afebrile. Head is nontraumatic. Neck is supple without bruit.    Cardiac exam no murmur or gallop. Lungs are clear to auscultation. Distal pulses are well felt. Neurological Exam :  Awake and alert oriented to time place and person. Diminished attention registration and recall. Speech is fluent without aphasia or dysarthria. Extraocular moments are full range without nystagmus. Blinks to threat bilaterally. Fundi were not visualized. Mild right lower facial asymmetry when she smiles. Tongue midline. Motor system exam no upper or lower eczema to drift but mild weakness of the right grip and intrinsic hand muscles. Orbits left over right upper extremity. Deep tendon reflexes are symmetric. Ankles are depressed. Plantars are downgoing. Sensation is intact. Gait not tested. ASSESSMENT/PLAN Sandra Shelton is a 62 y.o. female with history of hypertension and obesity found down  by her sister.  Admitted to the hospital for DKA.  CT done when RN noted right greater than left grip showed subacute left frontal and left caudate infarcts.   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  Concern for carotid stenosis.  CT head acute to subacute left frontal and caudate infarcts.Small vessel disease. Atrophy.   MRI patchy multifocal early subacute left MCA infarcts  MRA small left ICA compared to right with decreased flow in left MCA distribution, etiology unknown.  Mild atherosclerotic changes.  Carotid Doppler  pending   2D Echo  pending   LDL 82  HgbA1c >15.5  Heparin 5000 units sq tid for VTE prophylaxis  aspirin 81 mg daily prior to admission, now on No antithrombotic. Given  mild stroke, will place on aspirin 81 mg and plavix 75 mg daily x 3 weeks,  then plavix alone. Orders adjusted. May readjust pending carotid findings.  Therapy recommendations:  CIR. Consult placed.  Disposition:  pending   Hx Hypertension Hypotension in setting of hypovolemia, resolved  Stable . Permissive hypertension (OK if < 220/120) but gradually normalize in 5-7 days . Long-term BP goal normotensive  Hyperlipidemia  Home meds: Crestor 20  Crestor increased to 40 mg in hospital  LDL 82, goal < 70  Continue statin at discharge  Diabetes type II, new diagnosis  Admitted for DKA, glucose is greater than 800   HgbA1c greater than 15.5, goal < 7.0  Uncontrolled  Other Stroke Risk Factors  Other Active Problems  Respiratory insufficiency in setting of DKA  Severe anion gap metabolic acidosis, hyperchloremic secondary to DKA  Hospital day # 3 I have personally examined this patient, reviewed notes, independently viewed imaging studies, participated in medical decision making and plan of care.ROS completed by me personally and pertinent positives fully documented  I have made any additions or clarifications directly to the above note. Agree with note above. She has presented with embolic left MCA branch infarcts in setting of diabetic ketoacidosis. Recommend checking echo and echocardiogram and carotid Dopplers. Continue antiplatelet therapy and aggressive risk factor modification. Discussed with patient and critical care physician Asst. At the bedside. Greater than 50% time during this 35 minute visitt was spent in counseling and cognition of care about her embolic stroke and answered questions.  Antony Contras, MD Medical Director [facility] Pager: [phone] 07/09/2017 3:54 PM     To contact Stroke Continuity provider, please refer to [email or web address] After hours, contact General Neurology

## 2017-07-09 NOTE — Progress Notes (Signed)
K+2.9 resulted to Jennelle Human NP

## 2017-07-09 NOTE — Evaluation (Signed)
Physical Therapy Evaluation Patient Details Name: Sandra Shelton MRN: [id number] DOB: 18-Apr-1955 Today's Date: 07/09/2017   History of Present Illness  Presents to ED on 6/21 after being found obtunded at home. Upon arrival to ED BP 76/50. Afebrile, Tachycardiac. Glucose 1265. 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 DKA. No previous hx of DM.  Clinical Impression  Pt admitted with above. Pt with impaired sequencing, problem solving, memory, completing simple ADLs ie. Didn't know how to turn on a light switch and decreased insight to deficits and safety. Pt with impaired balance as well and difficulty navigating hallways requiring minA for ambulation. Pt was indep and working PTA. Pt to strongly benefit from CIR upon d/c for maximum functional recovery for safe transition home with niece/sister.    Follow Up Recommendations CIR    Equipment Recommendations  None recommended by PT(TBD)    Recommendations for Other Services Rehab consult     Precautions / Restrictions Precautions Precautions: Fall Restrictions Weight Bearing Restrictions: No      Mobility  Bed Mobility Overal bed mobility: Needs Assistance Bed Mobility: Supine to Sit     Supine to sit: Min guard     General bed mobility comments: directional verbal cues to stay on task  Transfers Overall transfer level: Needs assistance Equipment used: 2 person hand held assist(initially) Transfers: Sit to/from Stand Sit to Stand: Min guard         General transfer comment: initially unsteady with LOB posteriorly; LOB again posteriorly when backing up to chair  Ambulation/Gait Ambulation/Gait assistance: Min assist Gait Distance (Feet): 150 Feet Assistive device: 1 person hand held assist Gait Pattern/deviations: Step-through pattern;Decreased stride length;Narrow base of support Gait velocity: slow Gait  velocity interpretation: <1.31 ft/sec, indicative of household ambulator General Gait Details: pt initially bilat HHA then transitioned to 1 person hand held assist. pt mildly unsteady, especially with turning. pt with minimal navigation skills requiring max directional verbal cues to find her room  Stairs            Wheelchair Mobility    Modified Rankin (Stroke Patients Only) Modified Rankin (Stroke Patients Only) Pre-Morbid Rankin Score: No symptoms Modified Rankin: Moderate disability     Balance Overall balance assessment: Needs assistance Sitting-balance support: Feet supported Sitting balance-Leahy Scale: Good     Standing balance support: During functional activity Standing balance-Leahy Scale: Fair Standing balance comment: stood at sink to brush teeth, required min guard assit                             Pertinent Vitals/Pain Pain Assessment: No/denies pain    Home Living Family/patient expects to be discharged to:: Private residence Living Arrangements: Alone Available Help at Discharge: Friend(s);Available 24 hours/day Type of Home: Apartment Home Access: Elevator     Home Layout: One level Home Equipment: None Additional Comments: plan on going to nieces house, 2 steps to enter, 2 level, walk in shower, standard toliets    Prior Function Level of Independence: Independent         Comments: drove, 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     Hand Dominance   Dominant Hand: Right    Extremity/Trunk Assessment   Upper Extremity Assessment Upper Extremity Assessment: Defer to OT evaluation RUE Deficits / Details: Brunstrom Stage VI arm adn V hand; stronger proximally; grip functional but clumsy; frequently  dropping items; apparnet sensorimotor dysfunction RUE Sensation: ("feels different") RUE Coordination: decreased fine motor    Lower Extremity Assessment Lower Extremity Assessment: RLE deficits/detail RLE Deficits /  Details: grossly 4/5 RLE Sensation: decreased light touch    Cervical / Trunk Assessment Cervical / Trunk Assessment: Normal  Communication   Communication: No difficulties  Cognition Arousal/Alertness: Awake/alert Behavior During Therapy: WFL for tasks assessed/performed Overall Cognitive Status: Impaired/Different from baseline Area of Impairment: Problem solving                   Current Attention Level: Sustained Memory: Decreased recall of precautions;Decreased short-term memory(intact immediate recall; poor delayed recall) Following Commands: Follows multi-step commands inconsistently Safety/Judgement: Decreased awareness of safety;Decreased awareness of deficits Awareness: Emergent Problem Solving: Slow processing;Difficulty sequencing;Requires verbal cues;Requires tactile cues General Comments: pt with difficulty with ADLs, ie washing face, bathing, turning on lights(unable to recall room number after 1 min delay; difficulty f)      General Comments      Exercises     Assessment/Plan    PT Assessment Patient needs continued PT services  PT Problem List Decreased strength;Decreased activity tolerance;Decreased mobility;Decreased coordination;Decreased balance;Decreased cognition;Decreased knowledge of use of DME;Decreased safety awareness;Decreased knowledge of precautions       PT Treatment Interventions DME instruction;Gait training;Stair training;Functional mobility training;Therapeutic activities;Therapeutic exercise;Balance training;Neuromuscular re-education    PT Goals (Current goals can be found in the Care Plan section)  Acute Rehab PT Goals Patient Stated Goal: to get better PT Goal Formulation: With patient/family Time For Goal Achievement: 08/22/17 Potential to Achieve Goals: Good    Frequency Min 4X/week   Barriers to discharge        Co-evaluation PT/OT/SLP Co-Evaluation/Treatment: Yes Reason for Co-Treatment: For patient/therapist  safety PT goals addressed during session: Mobility/safety with mobility OT goals addressed during session: ADL's and self-care       AM-PAC PT "6 Clicks" Daily Activity  Outcome Measure Difficulty turning over in bed (including adjusting bedclothes, sheets and blankets)?: None Difficulty moving from lying on back to sitting on the side of the bed? : None Difficulty sitting down on and standing up from a chair with arms (e.g., wheelchair, bedside commode, etc,.)?: A Little Help needed moving to and from a bed to chair (including a wheelchair)?: A Little Help needed walking in hospital room?: A Little Help needed climbing 3-5 steps with a railing? : A Little 6 Click Score: 20    End of Session Equipment Utilized During Treatment: Gait belt Activity Tolerance: Patient tolerated treatment well Patient left: (with OT at sink) Nurse Communication: Mobility status PT Visit Diagnosis: Unsteadiness on feet (R26.81);Hemiplegia and hemiparesis Hemiplegia - Right/Left: Right Hemiplegia - caused by: Cerebral infarction    Time: 1119-1140 PT Time Calculation (min) (ACUTE ONLY): 21 min   Charges:   PT Evaluation $PT Eval Moderate Complexity: 1 Mod     PT G Codes:        Kittie Plater, PT, DPT Pager #: [phone] Office #: [phone]   Johnathyn Viscomi M Lazarus [place] 07/09/2017, 1:21 PM

## 2017-07-09 NOTE — Progress Notes (Signed)
Occupational Therapy Evaluation Patient Details Name: Sandra Shelton MRN: [id number] DOB: 06-06-1955 Today's Date: 07/09/2017    History of Present Illness Presents to ED on 6/21 after being found obtunded at home. Upon arrival to ED BP 76/50. Afebrile, Tachycardiac. Glucose 1265. 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 DKA. No previous hx of DM.   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 Pt presents with significant functional decline, requiring min A with functional mobility and mod A with ADL due to apparent sensorimotor, balance and cognitive deficits.. Given significant change in functional level of performance, feel pt would benefit from intensive rehab at CIR to facilitate safe DC home @ modified independent level. Will follow acutely to address established goals.     Follow Up Recommendations  CIR;Supervision/Assistance - 24 hour    Equipment Recommendations  3 in 1 bedside commode    Recommendations for Other Services Rehab consult     Precautions / Restrictions Precautions Precautions: Fall Restrictions Weight Bearing Restrictions: No      Mobility Bed Mobility Overal bed mobility: Needs Assistance Bed Mobility: Supine to Sit     Supine to sit: Min guard        Transfers Overall transfer level: Needs assistance Equipment used: 2 person hand held assist(initially) Transfers: Sit to/from Stand Sit to Stand: Min guard         General transfer comment: initially unsteady with LOB posteriorly; LOB again posteriorly when backing up to chair    Balance Overall balance assessment: Needs assistance Sitting-balance support: Feet supported Sitting balance-Leahy Scale: Good       Standing balance-Leahy Scale: Fair                              ADL either performed or assessed with clinical judgement   ADL Overall ADL's : Needs assistance/impaired Eating/Feeding: Supervision/ safety;Sitting   Grooming: Minimal assistance;Standing   Upper Body Bathing: Minimal assistance;Sitting   Lower Body Bathing: Moderate assistance;Sit to/from stand   Upper Body Dressing : Minimal assistance;Sitting   Lower Body Dressing: Minimal assistance;Sit to/from stand   Toilet Transfer: Minimal assistance;Ambulation;BSC   Toileting- Clothing Manipulation and Hygiene: Maximal assistance Toileting - Clothing Manipulation Details (indicate cue type and reason): incontinent of urine and BM during session; Mod vc to initiate then sequence how to clean up from accident     Functional mobility during ADLs: Minimal assistance  Apparent difficulty with initiation, sequencing/organization and reasoning during ADL tasks       Vision   Vision Assessment?: Vision impaired- to be further tested in functional context Additional Comments: wears glasses  - did not have for eval; will further assess; asked pt to have her sisters bring in her glasses     Perception Perception Comments: will further assess   Praxis Praxis Praxis tested?: Deficits Deficits: Initiation;Perseveration;Organization    Pertinent Vitals/Pain Pain Assessment: No/denies pain     Hand Dominance Right   Extremity/Trunk Assessment Upper Extremity Assessment Upper Extremity Assessment: RUE deficits/detail RUE Deficits / Details: Brunstrom Stage VI arm adn V hand; stronger proximally; grip functional but clumsy; frequently dropping items; apparnet sensorimotor dysfunction RUE Sensation: ("feels different") RUE Coordination: decreased fine motor   Lower Extremity Assessment Lower Extremity Assessment: Defer to PT evaluation  Communication Communication Communication: No difficulties   Cognition Arousal/Alertness: Awake/alert Behavior During  Therapy: WFL for tasks assessed/performed Overall Cognitive Status: Impaired/Different from baseline Area of Impairment: Problem solving                   Current Attention Level: Sustained Memory: Decreased recall of precautions;Decreased short-term memory(intact immediate recall; poor delayed recall) Following Commands: Follows multi-step commands inconsistently Safety/Judgement: Decreased awareness of safety;Decreased awareness of deficits Awareness: Emergent Problem Solving: Slow processing General Comments: Perseveration noted during ADL task. Pt unable to problem solve how to switch on the light and was perseverating on operating the water from the foot control; incontinenet of urine/BM during session and required cues to problem solve and sequence how to clean up from accident(unable to recall room number after 1 min delay; difficulty f)   General Comments       Exercises     Shoulder Instructions      Home Living Family/patient expects to be discharged to:: Private residence Living Arrangements: Alone Available Help at Discharge: Friend(s);Available 24 hours/day Type of Home: Apartment Home Access: Elevator     Home Layout: One level     Bathroom Shower/Tub: Teacher, early years/pre: Handicapped height Bathroom Accessibility: Yes   Home Equipment: None   Additional Comments: plan on going to nieces house, 2 steps to enter, 2 level, walk in shower, standard toliets      Prior Functioning/Environment Level of Independence: Independent        Comments: drove, 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        OT Problem List: Decreased strength;Decreased activity tolerance;Impaired balance (sitting and/or standing);Impaired vision/perception;Decreased coordination;Decreased cognition;Decreased safety awareness;Decreased knowledge of use of DME or AE;Decreased knowledge of precautions;Impaired UE functional use      OT Treatment/Interventions:  Self-care/ADL training;Therapeutic exercise;Neuromuscular education;DME and/or AE instruction;Therapeutic activities;Cognitive remediation/compensation;Visual/perceptual remediation/compensation;Patient/family education;Balance training    OT Goals(Current goals can be found in the care plan section) Acute Rehab OT Goals Patient Stated Goal: to get better OT Goal Formulation: With patient Time For Goal Achievement: 07/23/17 Potential to Achieve Goals: Good  OT Frequency: Min 3X/week   Barriers to D/C:            Co-evaluation PT/OT/SLP Co-Evaluation/Treatment: Yes Reason for Co-Treatment: For patient/therapist safety   OT goals addressed during session: ADL's and self-care      AM-PAC PT "6 Clicks" Daily Activity     Outcome Measure Help from another person eating meals?: A Little Help from another person taking care of personal grooming?: A Little Help from another person toileting, which includes using toliet, bedpan, or urinal?: A Lot Help from another person bathing (including washing, rinsing, drying)?: A Lot Help from another person to put on and taking off regular upper body clothing?: A Little Help from another person to put on and taking off regular lower body clothing?: A Little 6 Click Score: 16   End of Session Equipment Utilized During Treatment: Gait belt Nurse Communication: Mobility status  Activity Tolerance: Patient tolerated treatment well Patient left: in chair;with call bell/phone within reach;with chair alarm set  OT Visit Diagnosis: Other abnormalities of gait and mobility (R26.89);Muscle weakness (generalized) (M62.81);Other symptoms and signs involving cognitive function                Time: 7681-1572 OT Time Calculation (min): 38 min Charges:  OT General Charges $OT Visit: 1 Visit OT Evaluation $OT Eval Moderate Complexity: 1 Mod OT Treatments $Self Care/Home Management :  8-22 mins G-Codes:     Maurie Boettcher, OT/L  OT Clinical  Specialist [phone]   [facility] 07/09/2017, 12:51 PM

## 2017-07-09 NOTE — Progress Notes (Signed)
Patient transferred to [id number] via wheelchair with family

## 2017-07-09 NOTE — Progress Notes (Signed)
Inpatient Rehabilitation Admissions Coordinator  I met with patient, friend, and sister at bedside. We discussed goals an expectations of an inpt rehab admit. Patient working for the [employer]. States she has [employer]. Gave me her employers name and number, [employer] [phone], for me to contact to clarify if she has Medical insurance . We discussed different venues for rehab as well as cost. I will contact her employer tomorrow and follow up with patient once insurance clarified on her preference for rehab venue.  Danne Baxter, RN, MSN Rehab Admissions Coordinator [phone] 07/09/2017 6:35 PM

## 2017-07-09 NOTE — Consult Note (Signed)
Physical Medicine and Rehabilitation Consult Reason for Consult: Decreased functional mobility Referring Physician: [facility]   HPI: Sandra Shelton is a 62 y.o. right-handed female with history of hypertension.  Per chart review patient lives alone independent prior to admission and drives.  Works with [employer].  One level home apartment.  She has family in the area that check on her as needed.  Presented 07/06/2017 with altered mental status as well as tachycardic and hypotensive with blood pressure of 76 and noted blood sugar of 600.  No documentation of prior diabetes mellitus.  Troponin 0 0.20.  Cranial CT scan negative.  Patient did not receive TPA.  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  MRA with atherosclerotic type changes with overall decreased and attenuated flow within the left MCA distribution.  Echocardiogram with ejection fraction of 29% grade 1 diastolic dysfunction.  Findings of hemoglobin A1c 15.5.  Presently on aspirin and Plavix for CVA prophylaxis.  Subcutaneous heparin for DVT prophylaxis.  Insulin therapy initiated for new findings of diabetes.  Tolerating mechanical soft diet.  Physical therapy evaluation completed with recommendations of physical medicine rehab consult.   Review of Systems  Constitutional: Negative for fever.  HENT: Negative for hearing loss.   Eyes: Negative for blurred vision and double vision.  Respiratory: Negative for shortness of breath.   Cardiovascular: Positive for leg swelling. Negative for chest pain and palpitations.  Gastrointestinal: Positive for constipation. Negative for nausea and vomiting.  Genitourinary: Negative for dysuria, flank pain and hematuria.  Skin: Negative for rash.  Neurological: Positive for dizziness.       Occasional headaches  All other systems reviewed and are negative.  Past Medical History:  Diagnosis Date  . Hypertension     Past Surgical History:  Procedure Laterality Date  . UTERINE FIBROID SURGERY     Family History  Problem Relation Age of Onset  . Diabetes Mellitus II Sister    Social History:  reports that she has never smoked. She has never used smokeless tobacco. She reports that she does not drink alcohol or use drugs. Allergies: No Known Allergies Medications Prior to Admission  Medication Sig Dispense Refill  . aspirin 81 MG tablet Take 81 mg by mouth daily.    . betamethasone dipropionate (DIPROLENE) 0.05 % cream Apply topically 2 (two) times daily. (Patient not taking: Reported on 07/06/2017) 15 g 1  . cloNIDine (CATAPRES) 0.2 MG tablet Take 0.2 mg by mouth 2 (two) times daily.    . meclizine (ANTIVERT) 25 MG tablet Take 1 tablet (25 mg total) by mouth 4 (four) times daily. (Patient not taking: Reported on 07/06/2017) 28 tablet 0  . metFORMIN (GLUCOPHAGE) 500 MG tablet Take 500 mg by mouth 2 (two) times daily with a meal.    . metoCLOPramide (REGLAN) 10 MG tablet Take 10 mg by mouth 4 (four) times daily.    . rosuvastatin (CRESTOR) 20 MG tablet Take 20 mg by mouth daily.    Marland Kitchen triamterene-hydrochlorothiazide (DYAZIDE) 37.5-25 MG capsule Take 1 capsule by mouth daily.      Home: Home Living Family/patient expects to be discharged to:: Private residence Living Arrangements: Alone Available Help at Discharge: Friend(s), Available 24 hours/day Type of Home: Apartment Home Access: [place]: One level Bathroom Shower/Tub: Chiropodist: Handicapped height Bathroom Accessibility: Yes Home Equipment: None Additional Comments: plan on going to nieces house, 2 steps to enter, 2 level, walk in  shower, standard toliets  Lives With: Alone  Functional History: Prior Function Level of Independence: Independent Comments: drove, 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 Functional Status:  Mobility: Bed Mobility Overal bed mobility: Needs Assistance Bed Mobility:  Supine to Sit Supine to sit: Min guard General bed mobility comments: directional verbal cues to stay on task Transfers Overall transfer level: Needs assistance Equipment used: 2 person hand held assist(initially) Transfers: Sit to/from Stand Sit to Stand: Min guard General transfer comment: initially unsteady with LOB posteriorly; LOB again posteriorly when backing up to chair Ambulation/Gait Ambulation/Gait assistance: Min assist Gait Distance (Feet): 150 Feet Assistive device: 1 person hand held assist Gait Pattern/deviations: Step-through pattern, Decreased stride length, Narrow base of support General Gait Details: pt initially bilat HHA then transitioned to 1 person hand held assist. pt mildly unsteady, especially with turning. pt with minimal navigation skills requiring max directional verbal cues to find her room Gait velocity: slow Gait velocity interpretation: <1.31 ft/sec, indicative of household ambulator    ADL: ADL Overall ADL's : Needs assistance/impaired Eating/Feeding: Supervision/ safety, Sitting Grooming: Minimal assistance, Standing Upper Body Bathing: Minimal assistance, Sitting Lower Body Bathing: Moderate assistance, Sit to/from stand Upper Body Dressing : Minimal assistance, Sitting Lower Body Dressing: Minimal assistance, Sit to/from stand Toilet Transfer: Minimal assistance, Ambulation, BSC Toileting- Clothing Manipulation and Hygiene: Maximal assistance Toileting - Clothing Manipulation Details (indicate cue type and reason): incontinent of urine and BM during session; Mod vc to initiate then sequence how to clean up from accident Functional mobility during ADLs: Minimal assistance  Cognition: Cognition Overall Cognitive Status: Impaired/Different from baseline Arousal/Alertness: Awake/alert Orientation Level: Oriented X4 Attention: Sustained Sustained Attention: Impaired Sustained Attention Impairment: Verbal basic, Functional basic Memory:  Impaired Memory Impairment: Retrieval deficit, Decreased recall of new information, Decreased short term memory Decreased Short Term Memory: Verbal basic, Functional basic Awareness: Appears intact Problem Solving: Impaired Problem Solving Impairment: Verbal basic, Functional basic Executive Function: Organizing, Reasoning Reasoning: Impaired Reasoning Impairment: Verbal basic, Functional basic Organizing: Impaired Organizing Impairment: Verbal basic, Functional basic Behaviors: Perseveration, Other (comment)(flat affect) Safety/Judgment: Impaired Cognition Arousal/Alertness: Awake/alert Behavior During Therapy: WFL for tasks assessed/performed Overall Cognitive Status: Impaired/Different from baseline Area of Impairment: Problem solving Current Attention Level: Sustained Memory: Decreased recall of precautions, Decreased short-term memory(intact immediate recall; poor delayed recall) Following Commands: Follows multi-step commands inconsistently Safety/Judgement: Decreased awareness of safety, Decreased awareness of deficits Awareness: Emergent Problem Solving: Slow processing, Difficulty sequencing, Requires verbal cues, Requires tactile cues General Comments: pt with difficulty with ADLs, ie washing face, bathing, turning on lights(unable to recall room number after 1 min delay; difficulty f)  Blood pressure 136/61, pulse 72, temperature (!) 97.5 F (36.4 C), temperature source Axillary, resp. rate 17, height 5\' 6"  (1.676 m), weight 80 kg (176 lb 5.9 oz), SpO2 98 %. Physical Exam  Constitutional: She appears well-developed and well-nourished.  HENT:  Head: Normocephalic and atraumatic.  Neck: Normal range of motion.  Cardiovascular: Normal rate.  Respiratory: Effort normal.  GI: Soft.  Musculoskeletal: Normal range of motion.  Neurological: She is alert.  Patient is sitting up in chair.  Alert with flat affect.  Provides her name age and follow simple commands. Word finding  deficits. Oriented to place, person, date (except day), reason she's here. Mild right central7. RUE 4/5 prox to distal. LUE 5/5. RLE 4/5 prox to distal. LLE 5/5. No gross sensory findings.   Skin: Skin is warm.  Psychiatric: She has a normal mood and affect. Her behavior is normal.  Results for orders placed or performed during the hospital encounter of 07/05/17 (from the past 24 hour(s))  Sodium     Status: Abnormal   Collection Time: 07/08/17  3:39 PM  Result Value Ref Range   Sodium 156 (H) 135 - 145 mmol/L  TSH     Status: None   Collection Time: 07/08/17  3:39 PM  Result Value Ref Range   TSH 0.496 0.350 - 4.500 uIU/mL  Lipid panel     Status: Abnormal   Collection Time: 07/08/17  3:39 PM  Result Value Ref Range   Cholesterol 186 0 - 200 mg/dL   Triglycerides 462 (H) <150 mg/dL   HDL 19 (L) >40 mg/dL   Total CHOL/HDL Ratio 9.8 RATIO   VLDL UNABLE TO CALCULATE IF TRIGLYCERIDE OVER 400 mg/dL 0 - 40 mg/dL   LDL Cholesterol UNABLE TO CALCULATE IF TRIGLYCERIDE OVER 400 mg/dL 0 - 99 mg/dL  Glucose, capillary     Status: Abnormal   Collection Time: 07/08/17  3:47 PM  Result Value Ref Range   Glucose-Capillary 101 (H) 65 - 99 mg/dL   Comment 1 Capillary Specimen    Comment 2 Notify RN   Glucose, capillary     Status: None   Collection Time: 07/08/17  7:09 PM  Result Value Ref Range   Glucose-Capillary 95 65 - 99 mg/dL   Comment 1 Capillary Specimen    Comment 2 Notify RN   Glucose, capillary     Status: None   Collection Time: 07/08/17 11:39 PM  Result Value Ref Range   Glucose-Capillary 93 65 - 99 mg/dL   Comment 1 Capillary Specimen    Comment 2 Notify RN   Glucose, capillary     Status: None   Collection Time: 07/09/17  3:08 AM  Result Value Ref Range   Glucose-Capillary 89 70 - 99 mg/dL   Comment 1 Capillary Specimen    Comment 2 Notify RN   CBC     Status: Abnormal   Collection Time: 07/09/17  6:46 AM  Result Value Ref Range   WBC 12.4 (H) 4.0 - 10.5 K/uL   RBC  4.37 3.87 - 5.11 MIL/uL   Hemoglobin 12.2 12.0 - 15.0 g/dL   HCT 39.1 36.0 - 46.0 %   MCV 89.5 78.0 - 100.0 fL   MCH 27.9 26.0 - 34.0 pg   MCHC 31.2 30.0 - 36.0 g/dL   RDW 14.0 11.5 - 15.5 %   Platelets 102 (L) 150 - 400 K/uL  Magnesium     Status: None   Collection Time: 07/09/17  6:46 AM  Result Value Ref Range   Magnesium 1.8 1.7 - 2.4 mg/dL  Lipid panel     Status: Abnormal   Collection Time: 07/09/17  6:46 AM  Result Value Ref Range   Cholesterol 180 0 - 200 mg/dL   Triglycerides 380 (H) <150 mg/dL   HDL 22 (L) >40 mg/dL   Total CHOL/HDL Ratio 8.2 RATIO   VLDL 76 (H) 0 - 40 mg/dL   LDL Cholesterol 82 0 - 99 mg/dL  Renal function panel     Status: Abnormal   Collection Time: 07/09/17  6:46 AM  Result Value Ref Range   Sodium 150 (H) 135 - 145 mmol/L   Potassium 2.9 (L) 3.5 - 5.1 mmol/L   Chloride 117 (H) 98 - 111 mmol/L   CO2 26 22 - 32 mmol/L   Glucose, Bld 124 (H) 70 - 99 mg/dL   BUN 15  8 - 23 mg/dL   Creatinine, Ser 1.12 (H) 0.44 - 1.00 mg/dL   Calcium 8.1 (L) 8.9 - 10.3 mg/dL   Phosphorus 2.1 (L) 2.5 - 4.6 mg/dL   Albumin 2.2 (L) 3.5 - 5.0 g/dL   GFR calc non Af Amer 51 (L) >60 mL/min   GFR calc Af Amer 59 (L) >60 mL/min   Anion gap 7 5 - 15  Glucose, capillary     Status: Abnormal   Collection Time: 07/09/17  7:51 AM  Result Value Ref Range   Glucose-Capillary 104 (H) 70 - 99 mg/dL   Comment 1 Capillary Specimen    Comment 2 Notify RN   Glucose, capillary     Status: None   Collection Time: 07/09/17 10:58 AM  Result Value Ref Range   Glucose-Capillary 97 70 - 99 mg/dL   Comment 1 Capillary Specimen    Comment 2 Notify RN    Ct Head Wo Contrast  Result Date: 07/08/2017 CLINICAL DATA:  61 year old female with altered mental status and RIGHT facial pain and swelling. EXAM: CT HEAD WITHOUT CONTRAST CT MAXILLOFACIAL WITHOUT CONTRAST TECHNIQUE: Multidetector CT imaging of the head and maxillofacial structures were performed using the standard protocol without  intravenous contrast. Multiplanar CT image reconstructions of the maxillofacial structures were also generated. COMPARISON:  07/05/2017 head CT FINDINGS: CT HEAD FINDINGS Brain: Two small to moderate LEFT frontal infarcts and a LEFT caudate infarct appears acute to subacute and new on CT since 07/05/2017. There is no evidence of hemorrhage or mass effect. Atrophy and mild chronic small-vessel white matter ischemic changes are again noted. No midline shift, hydrocephalus or extra-axial collection identified. Vascular: Atherosclerotic calcifications again noted. Skull: Normal. Negative for fracture or focal lesion. Other: None. CT MAXILLOFACIAL FINDINGS Osseous: No fracture or mandibular dislocation. No destructive process. Orbits: Negative. No traumatic or inflammatory finding. Sinuses: Clear. Soft tissues: Negative. IMPRESSION: 1. 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 No hemorrhage or mass effect. 2. Mild atrophy and chronic small-vessel white matter ischemic changes. 3. No significant facial abnormality. Electronically Signed   By: Margarette Canada M.D.   On: 07/08/2017 14:43   Mr Brain Wo Contrast  Result Date: 07/09/2017 CLINICAL DATA:  Initial evaluation for follow-up examination for acute stroke. EXAM: MRI HEAD WITHOUT CONTRAST MRA HEAD WITHOUT CONTRAST TECHNIQUE: Multiplanar, multiecho pulse sequences of the brain and surrounding structures were obtained without intravenous contrast. Angiographic images of the head were obtained using MRA technique without contrast. COMPARISON:  Prior CT from 07/08/2017. FINDINGS: MRI HEAD FINDINGS Brain: Cerebral volume within normal limits for age. No significant cerebral white matter changes. There are patchy multifocal acute to early subacute ischemic infarcts involving the cortical gray matter and underlying subcortical white matter of the left frontal and parietal temporal regions, consistent with left MCA territory infarcts.  Patchy involvement of the caudate. No associated mass effect or evidence for hemorrhage. No other acute or subacute ischemia. Gray-white matter differentiation otherwise maintained. No evidence for acute or chronic intracranial hemorrhage. No mass lesion, midline shift or mass effect. No hydrocephalus. No extra-axial fluid collection. Pituitary gland within normal limits. Vascular: Left ICA demonstrates a diminutive vascular flow void. Major intracranial vascular flow voids otherwise maintained. Skull and upper cervical spine: Craniocervical junction within normal limits. Upper cervical spine normal. Bone marrow signal intensity normal. No scalp soft tissue abnormality. Sinuses/Orbits: Globes orbital soft tissues within normal limits. Mild scattered mucosal thickening within the ethmoidal air cells. Paranasal sinuses are  otherwise clear. No mastoid effusion. Other: None. MRA HEAD FINDINGS ANTERIOR CIRCULATION: Examination somewhat degraded by motion artifact. The left ICA is diminutive to the level of the terminus, of uncertain chronicity. Possible superimpose mild stenosis at the distal cervical left ICA. Right ICA patent to the terminus without hemodynamically significant stenosis. Mild carotid siphon atherosclerotic change. A1 segments, anterior communicating artery common anterior cerebral arteries patent without stenosis. M1 segments patent without stenosis or occlusion. There is overall decreased attenuation throughout the left MCA territory as compared to the right. No proximal M2 occlusion. Mild distal small vessel atheromatous irregularity within the MCA branches, most notable on the left. POSTERIOR CIRCULATION: Dominant left vertebral artery patent to the vertebrobasilar junction. Hypoplastic right vertebral artery largely terminates in PICA, although a small branch ascending towards the vertebrobasilar junction. Right PICA patent. Left PICA not visualized. Basilar patent to its distal aspect without  stenosis. Superior cerebellar arteries patent bilaterally. Short-segment moderate proximal right P2 stenosis, with additional mild narrowing distally. Left CCA widely patent. No intracranial aneurysm. IMPRESSION: MRI HEAD IMPRESSION: 1. Patchy multifocal acute to early subacute ischemic nonhemorrhagic left MCA territory infarcts. No significant mass effect. 2. Otherwise normal brain MRI for age. MRA HEAD IMPRESSION: 1. Diminutive left ICA as compared to the right, with overall decreased and attenuated flow within the left MCA distribution. Finding of unknown chronicity, although further evaluation with vascular imaging of the neck is suggested to fully evaluate these findings. 2. Mild atherosclerotic change involving the intracranial circulation for age. No proximal high-grade or correctable stenosis identified. Electronically Signed   By: Jeannine Boga M.D.   On: 07/09/2017 06:29   Mr Jodene Nam Head Wo Contrast  Result Date: 07/09/2017 CLINICAL DATA:  Initial evaluation for follow-up examination for acute stroke. EXAM: MRI HEAD WITHOUT CONTRAST MRA HEAD WITHOUT CONTRAST TECHNIQUE: Multiplanar, multiecho pulse sequences of the brain and surrounding structures were obtained without intravenous contrast. Angiographic images of the head were obtained using MRA technique without contrast. COMPARISON:  Prior CT from 07/08/2017. FINDINGS: MRI HEAD FINDINGS Brain: Cerebral volume within normal limits for age. No significant cerebral white matter changes. There are patchy multifocal acute to early subacute ischemic infarcts involving the cortical gray matter and underlying subcortical white matter of the left frontal and parietal temporal regions, consistent with left MCA territory infarcts. Patchy involvement of the caudate. No associated mass effect or evidence for hemorrhage. No other acute or subacute ischemia. Gray-white matter differentiation otherwise maintained. No evidence for acute or chronic intracranial  hemorrhage. No mass lesion, midline shift or mass effect. No hydrocephalus. No extra-axial fluid collection. Pituitary gland within normal limits. Vascular: Left ICA demonstrates a diminutive vascular flow void. Major intracranial vascular flow voids otherwise maintained. Skull and upper cervical spine: Craniocervical junction within normal limits. Upper cervical spine normal. Bone marrow signal intensity normal. No scalp soft tissue abnormality. Sinuses/Orbits: Globes orbital soft tissues within normal limits. Mild scattered mucosal thickening within the ethmoidal air cells. Paranasal sinuses are otherwise clear. No mastoid effusion. Other: None. MRA HEAD FINDINGS ANTERIOR CIRCULATION: Examination somewhat degraded by motion artifact. The left ICA is diminutive to the level of the terminus, of uncertain chronicity. Possible superimpose mild stenosis at the distal cervical left ICA. Right ICA patent to the terminus without hemodynamically significant stenosis. Mild carotid siphon atherosclerotic change. A1 segments, anterior communicating artery common anterior cerebral arteries patent without stenosis. M1 segments patent without stenosis or occlusion. There is overall decreased attenuation throughout the left MCA territory as compared to the right. No  proximal M2 occlusion. Mild distal small vessel atheromatous irregularity within the MCA branches, most notable on the left. POSTERIOR CIRCULATION: Dominant left vertebral artery patent to the vertebrobasilar junction. Hypoplastic right vertebral artery largely terminates in PICA, although a small branch ascending towards the vertebrobasilar junction. Right PICA patent. Left PICA not visualized. Basilar patent to its distal aspect without stenosis. Superior cerebellar arteries patent bilaterally. Short-segment moderate proximal right P2 stenosis, with additional mild narrowing distally. Left CCA widely patent. No intracranial aneurysm. IMPRESSION: MRI HEAD IMPRESSION:  1. Patchy multifocal acute to early subacute ischemic nonhemorrhagic left MCA territory infarcts. No significant mass effect. 2. Otherwise normal brain MRI for age. MRA HEAD IMPRESSION: 1. Diminutive left ICA as compared to the right, with overall decreased and attenuated flow within the left MCA distribution. Finding of unknown chronicity, although further evaluation with vascular imaging of the neck is suggested to fully evaluate these findings. 2. Mild atherosclerotic change involving the intracranial circulation for age. No proximal high-grade or correctable stenosis identified. Electronically Signed   By: Jeannine Boga M.D.   On: 07/09/2017 06:29   Ct Maxillofacial Wo Contrast  Result Date: 07/08/2017 CLINICAL DATA:  62 year old female with altered mental status and RIGHT facial pain and swelling. EXAM: CT HEAD WITHOUT CONTRAST CT MAXILLOFACIAL WITHOUT CONTRAST TECHNIQUE: Multidetector CT imaging of the head and maxillofacial structures were performed using the standard protocol without intravenous contrast. Multiplanar CT image reconstructions of the maxillofacial structures were also generated. COMPARISON:  07/05/2017 head CT FINDINGS: CT HEAD FINDINGS Brain: Two small to moderate LEFT frontal infarcts and a LEFT caudate infarct appears acute to subacute and new on CT since 07/05/2017. There is no evidence of hemorrhage or mass effect. Atrophy and mild chronic small-vessel white matter ischemic changes are again noted. No midline shift, hydrocephalus or extra-axial collection identified. Vascular: Atherosclerotic calcifications again noted. Skull: Normal. Negative for fracture or focal lesion. Other: None. CT MAXILLOFACIAL FINDINGS Osseous: No fracture or mandibular dislocation. No destructive process. Orbits: Negative. No traumatic or inflammatory finding. Sinuses: Clear. Soft tissues: Negative. IMPRESSION: 1. 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 No  hemorrhage or mass effect. 2. Mild atrophy and chronic small-vessel white matter ischemic changes. 3. No significant facial abnormality. Electronically Signed   By: Margarette Canada M.D.   On: 07/08/2017 14:43     Assessment/Plan: Diagnosis: left MCA infarct 1. Does the need for close, 24 hr/day medical supervision in concert with the patient's rehab needs make it unreasonable for this patient to be served in a less intensive setting? Yes 2. Co-Morbidities requiring supervision/potential complications: DM/DKA, post-stroke sequelae 3. Due to bladder management, bowel management, safety, skin/wound care, disease management, medication administration, pain management and patient education, does the patient require 24 hr/day rehab nursing? Yes 4. Does the patient require coordinated care of a physician, rehab nurse, PT (1-2 hrs/day, 5 days/week), OT (1-2 hrs/day, 5 days/week) and SLP (1-2 hrs/day, 5 days/week) to address physical and functional deficits in the context of the above medical diagnosis(es)? Yes Addressing deficits in the following areas: balance, endurance, locomotion, strength, transferring, bowel/bladder control, bathing, dressing, feeding, grooming, toileting, cognition, language and psychosocial support 5. Can the patient actively participate in an intensive therapy program of at least 3 hrs of therapy per day at least 5 days per week? Yes 6. The potential for patient to make measurable gains while on inpatient rehab is excellent 7. Anticipated functional outcomes upon discharge from inpatient rehab are modified independent  with  PT, modified independent with OT, modified independent with SLP. 8. Estimated rehab length of stay to reach the above functional goals is: 7-11 days 9. Anticipated D/C setting: Home 10. Anticipated post D/C treatments: [facility] therapy 11. Overall Rehab/Functional Prognosis: excellent  RECOMMENDATIONS: This patient's condition is appropriate for continued  rehabilitative care in the following setting: CIR Patient has agreed to participate in recommended program. Yes Note that insurance prior authorization may be required for reimbursement for recommended care.  Comment: Rehab Admissions Coordinator to follow up.  Thanks,  Meredith Staggers, MD, Mellody Drown  I have personally performed a face to face diagnostic evaluation of this patient. Additionally, I have reviewed and concur with the physician assistant's documentation above.     Lavon Paganini Angiulli, PA-C 07/09/2017

## 2017-07-10 ENCOUNTER — Inpatient Hospital Stay (HOSPITAL_COMMUNITY): Payer: Medicaid Other

## 2017-07-10 DIAGNOSIS — E119 Type 2 diabetes mellitus without complications: Secondary | ICD-10-CM

## 2017-07-10 DIAGNOSIS — I6330 Cerebral infarction due to thrombosis of unspecified cerebral artery: Secondary | ICD-10-CM

## 2017-07-10 LAB — RENAL FUNCTION PANEL
Albumin: 2.4 g/dL — ABNORMAL LOW (ref 3.5–5.0)
Anion gap: 12 (ref 5–15)
BUN: 13 mg/dL (ref 8–23)
CALCIUM: 8.4 mg/dL — AB (ref 8.9–10.3)
CHLORIDE: 110 mmol/L (ref 98–111)
CO2: 25 mmol/L (ref 22–32)
CREATININE: 1.01 mg/dL — AB (ref 0.44–1.00)
GFR calc Af Amer: >60 mL/min (ref >60)
GFR calc non Af Amer: 58 mL/min — ABNORMAL LOW (ref >60)
Glucose, Bld: 143 mg/dL — ABNORMAL HIGH (ref 70–99)
Phosphorus: 1.8 mg/dL — ABNORMAL LOW (ref 2.5–4.6)
Potassium: 3.3 mmol/L — ABNORMAL LOW (ref 3.5–5.1)
SODIUM: 147 mmol/L — AB (ref 135–145)

## 2017-07-10 LAB — CBC
HEMATOCRIT: 39.9 % (ref 36.0–46.0)
Hemoglobin: 12.5 g/dL (ref 12.0–15.0)
MCH: 28 pg (ref 26.0–34.0)
MCHC: 31.3 g/dL (ref 30.0–36.0)
MCV: 89.5 fL (ref 78.0–100.0)
PLATELETS: 121 10*3/uL — AB (ref 150–400)
RBC: 4.46 MIL/uL (ref 3.87–5.11)
RDW: 13.6 % (ref 11.5–15.5)
WBC: 7.9 10*3/uL (ref 4.0–10.5)

## 2017-07-10 LAB — GLUCOSE, CAPILLARY
GLUCOSE-CAPILLARY: 260 mg/dL — AB (ref 70–99)
GLUCOSE-CAPILLARY: 156 mg/dL — AB (ref 70–99)
GLUCOSE-CAPILLARY: 180 mg/dL — AB (ref 70–99)
Glucose-Capillary: 152 mg/dL — ABNORMAL HIGH (ref 70–99)

## 2017-07-10 LAB — CULTURE, BLOOD (ROUTINE X 2)
CULTURE: NO GROWTH
CULTURE: NO GROWTH
Special Requests: ADEQUATE

## 2017-07-10 LAB — ECHOCARDIOGRAM LIMITED
Height: 66 in
WEIGHTICAEL: 2821.89 [oz_av]

## 2017-07-10 LAB — PROCALCITONIN: Procalcitonin: 1.31 ng/mL

## 2017-07-10 MED ORDER — POTASSIUM & SODIUM PHOSPHATES 280-160-250 MG PO PACK
1.0000 | PACK | Freq: Three times a day (TID) | ORAL | Status: DC
  Administered 2017-07-10 – 2017-07-12 (×7): 1 via ORAL
  Filled 2017-07-10 (×10): qty 1

## 2017-07-10 MED ORDER — IOPAMIDOL (ISOVUE-370) INJECTION 76%
INTRAVENOUS | Status: AC
  Filled 2017-07-10: qty 50

## 2017-07-10 MED ORDER — POTASSIUM CHLORIDE CRYS ER 20 MEQ PO TBCR
40.0000 meq | EXTENDED_RELEASE_TABLET | Freq: Once | ORAL | Status: AC
  Administered 2017-07-10: 40 meq via ORAL
  Filled 2017-07-10: qty 2

## 2017-07-10 MED ORDER — IOPAMIDOL (ISOVUE-370) INJECTION 76%
50.0000 mL | Freq: Once | INTRAVENOUS | Status: AC | PRN
  Administered 2017-07-10: 50 mL via INTRAVENOUS

## 2017-07-10 NOTE — Progress Notes (Signed)
STROKE TEAM PROGRESS NOTE       INTERVAL HISTORY No family is at the bedside.  She states he has improved nearly to her baseline. Echocardiogram is pending. Carotid ultrasound shows no significant extracranial stenosis. But MRA of the brain had suggested diminished flow and caliber throughout the left carotid suggesting high-grade proximal stenosis  Vitals:   07/09/17 1614 07/09/17 2104 07/10/17 0517 07/10/17 0735  BP: (!) 132/43 (!) 140/56 (!) 147/62 (!) 154/61  Pulse: 77 74 69 68  Resp: 18 16 16 18   Temp: 97.7 F (36.5 C) 98.8 F (37.1 C) 98.1 F (36.7 C) 98.1 F (36.7 C)  TempSrc: Oral Oral Oral Oral  SpO2: 100% 100% 100% 97%  Weight:      Height:        CBC:  Recent Labs  Lab 07/05/17 2242  07/09/17 0646 07/10/17 0441  WBC 18.2*   < > 12.4* 7.9  NEUTROABS 16.3*  --   --   --   HGB 18.4*   < > 12.2 12.5  HCT 64.5*   < > 39.1 39.9  MCV 100.0   < > 89.5 89.5  PLT 329   < > 102* 121*   < > = values in this interval not displayed.    Basic Metabolic Panel:  Recent Labs  Lab 07/07/17 0312  07/09/17 0646 07/10/17 0441  NA  --    < > 150* 147*  K  --    < > 2.9* 3.3*  CL  --    < > 117* 110  CO2  --    < > 26 25  GLUCOSE  --    < > 124* 143*  BUN  --    < > 15 13  CREATININE  --    < > 1.12* 1.01*  CALCIUM  --    < > 8.1* 8.4*  MG 2.9*  --  1.8  --   PHOS  --   --  2.1* 1.8*   < > = values in this interval not displayed.   Lipid Panel:     Component Value Date/Time   CHOL 180 07/09/2017 0646   TRIG 380 (H) 07/09/2017 0646   HDL 22 (L) 07/09/2017 0646   CHOLHDL 8.2 07/09/2017 0646   VLDL 76 (H) 07/09/2017 0646   LDLCALC 82 07/09/2017 0646   HgbA1c:  Lab Results  Component Value Date   HGBA1C >15.5 (H) 07/06/2017   Urine Drug Screen:     Component Value Date/Time   LABOPIA NONE DETECTED 07/05/2017 2349   COCAINSCRNUR NONE DETECTED 07/05/2017 2349   LABBENZ NONE DETECTED 07/05/2017 2349   AMPHETMU NONE DETECTED 07/05/2017 2349   THCU NONE  DETECTED 07/05/2017 2349   LABBARB (A) 07/05/2017 2349    Result not available. Reagent lot number recalled by manufacturer.    Alcohol Level     Component Value Date/Time   [facility] 2 <10 07/05/2017 2242    IMAGING Mr Brain Wo Contrast  Result Date: 07/09/2017 CLINICAL DATA:  Initial evaluation for follow-up examination for acute stroke. EXAM: MRI HEAD WITHOUT CONTRAST MRA HEAD WITHOUT CONTRAST TECHNIQUE: Multiplanar, multiecho pulse sequences of the brain and surrounding structures were obtained without intravenous contrast. Angiographic images of the head were obtained using MRA technique without contrast. COMPARISON:  Prior CT from 07/08/2017. FINDINGS: MRI HEAD FINDINGS Brain: Cerebral volume within normal limits for age. No significant cerebral white matter changes. There are patchy multifocal acute to early subacute ischemic infarcts involving the  cortical gray matter and underlying subcortical white matter of the left frontal and parietal temporal regions, consistent with left MCA territory infarcts. Patchy involvement of the caudate. No associated mass effect or evidence for hemorrhage. No other acute or subacute ischemia. Gray-white matter differentiation otherwise maintained. No evidence for acute or chronic intracranial hemorrhage. No mass lesion, midline shift or mass effect. No hydrocephalus. No extra-axial fluid collection. Pituitary gland within normal limits. Vascular: Left ICA demonstrates a diminutive vascular flow void. Major intracranial vascular flow voids otherwise maintained. Skull and upper cervical spine: Craniocervical junction within normal limits. Upper cervical spine normal. Bone marrow signal intensity normal. No scalp soft tissue abnormality. Sinuses/Orbits: Globes orbital soft tissues within normal limits. Mild scattered mucosal thickening within the ethmoidal air cells. Paranasal sinuses are otherwise clear. No mastoid effusion. Other: None. MRA HEAD FINDINGS ANTERIOR  CIRCULATION: Examination somewhat degraded by motion artifact. The left ICA is diminutive to the level of the terminus, of uncertain chronicity. Possible superimpose mild stenosis at the distal cervical left ICA. Right ICA patent to the terminus without hemodynamically significant stenosis. Mild carotid siphon atherosclerotic change. A1 segments, anterior communicating artery common anterior cerebral arteries patent without stenosis. M1 segments patent without stenosis or occlusion. There is overall decreased attenuation throughout the left MCA territory as compared to the right. No proximal M2 occlusion. Mild distal small vessel atheromatous irregularity within the MCA branches, most notable on the left. POSTERIOR CIRCULATION: Dominant left vertebral artery patent to the vertebrobasilar junction. Hypoplastic right vertebral artery largely terminates in PICA, although a small branch ascending towards the vertebrobasilar junction. Right PICA patent. Left PICA not visualized. Basilar patent to its distal aspect without stenosis. Superior cerebellar arteries patent bilaterally. Short-segment moderate proximal right P2 stenosis, with additional mild narrowing distally. Left CCA widely patent. No intracranial aneurysm. IMPRESSION: MRI HEAD IMPRESSION: 1. Patchy multifocal acute to early subacute ischemic nonhemorrhagic left MCA territory infarcts. No significant mass effect. 2. Otherwise normal brain MRI for age. MRA HEAD IMPRESSION: 1. Diminutive left ICA as compared to the right, with overall decreased and attenuated flow within the left MCA distribution. Finding of unknown chronicity, although further evaluation with vascular imaging of the neck is suggested to fully evaluate these findings. 2. Mild atherosclerotic change involving the intracranial circulation for age. No proximal high-grade or correctable stenosis identified. Electronically Signed   By: Sandra Shelton M.D.   On: 07/09/2017 06:29   Mr Jodene Nam Head  Wo Contrast  Result Date: 07/09/2017 CLINICAL DATA:  Initial evaluation for follow-up examination for acute stroke. EXAM: MRI HEAD WITHOUT CONTRAST MRA HEAD WITHOUT CONTRAST TECHNIQUE: Multiplanar, multiecho pulse sequences of the brain and surrounding structures were obtained without intravenous contrast. Angiographic images of the head were obtained using MRA technique without contrast. COMPARISON:  Prior CT from 07/08/2017. FINDINGS: MRI HEAD FINDINGS Brain: Cerebral volume within normal limits for age. No significant cerebral white matter changes. There are patchy multifocal acute to early subacute ischemic infarcts involving the cortical gray matter and underlying subcortical white matter of the left frontal and parietal temporal regions, consistent with left MCA territory infarcts. Patchy involvement of the caudate. No associated mass effect or evidence for hemorrhage. No other acute or subacute ischemia. Gray-white matter differentiation otherwise maintained. No evidence for acute or chronic intracranial hemorrhage. No mass lesion, midline shift or mass effect. No hydrocephalus. No extra-axial fluid collection. Pituitary gland within normal limits. Vascular: Left ICA demonstrates a diminutive vascular flow void. Major intracranial vascular flow voids otherwise maintained. Skull and upper  cervical spine: Craniocervical junction within normal limits. Upper cervical spine normal. Bone marrow signal intensity normal. No scalp soft tissue abnormality. Sinuses/Orbits: Globes orbital soft tissues within normal limits. Mild scattered mucosal thickening within the ethmoidal air cells. Paranasal sinuses are otherwise clear. No mastoid effusion. Other: None. MRA HEAD FINDINGS ANTERIOR CIRCULATION: Examination somewhat degraded by motion artifact. The left ICA is diminutive to the level of the terminus, of uncertain chronicity. Possible superimpose mild stenosis at the distal cervical left ICA. Right ICA patent to the  terminus without hemodynamically significant stenosis. Mild carotid siphon atherosclerotic change. A1 segments, anterior communicating artery common anterior cerebral arteries patent without stenosis. M1 segments patent without stenosis or occlusion. There is overall decreased attenuation throughout the left MCA territory as compared to the right. No proximal M2 occlusion. Mild distal small vessel atheromatous irregularity within the MCA branches, most notable on the left. POSTERIOR CIRCULATION: Dominant left vertebral artery patent to the vertebrobasilar junction. Hypoplastic right vertebral artery largely terminates in PICA, although a small branch ascending towards the vertebrobasilar junction. Right PICA patent. Left PICA not visualized. Basilar patent to its distal aspect without stenosis. Superior cerebellar arteries patent bilaterally. Short-segment moderate proximal right P2 stenosis, with additional mild narrowing distally. Left CCA widely patent. No intracranial aneurysm. IMPRESSION: MRI HEAD IMPRESSION: 1. Patchy multifocal acute to early subacute ischemic nonhemorrhagic left MCA territory infarcts. No significant mass effect. 2. Otherwise normal brain MRI for age. MRA HEAD IMPRESSION: 1. Diminutive left ICA as compared to the right, with overall decreased and attenuated flow within the left MCA distribution. Finding of unknown chronicity, although further evaluation with vascular imaging of the neck is suggested to fully evaluate these findings. 2. Mild atherosclerotic change involving the intracranial circulation for age. No proximal high-grade or correctable stenosis identified. Electronically Signed   By: Sandra Shelton M.D.   On: 07/09/2017 06:29    PHYSICAL EXAM  Obese middle-aged African-American lady currently not in distress. . Afebrile. Head is nontraumatic. Neck is supple without bruit.    Cardiac exam no murmur or gallop. Lungs are clear to auscultation. Distal pulses are well  felt. Neurological Exam :  Awake and alert oriented to time place and person. Diminished attention registration and recall. Speech is fluent without aphasia or dysarthria. Extraocular moments are full range without nystagmus. Blinks to threat bilaterally. Fundi were not visualized. Mild right lower facial asymmetry when she smiles. Tongue midline. Motor system exam no upper or lower eczema to drift but mild weakness of the right grip and intrinsic hand muscles. Orbits left over right upper extremity. Deep tendon reflexes are symmetric. Ankles are depressed. Plantars are downgoing. Sensation is intact. Gait not tested. ASSESSMENT/PLAN Sandra Shelton is a 62 y.o. female with history of hypertension and obesity found down by her sister.  Admitted to the hospital for DKA.  CT done when RN noted right greater than left grip showed subacute left frontal and left caudate infarcts.   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  Concern for carotid stenosis.  CT head acute to subacute left frontal and caudate infarcts.Small vessel disease. Atrophy.   MRI patchy multifocal early subacute left MCA infarcts  MRA small left ICA compared to right with decreased flow in left MCA distribution, etiology unknown.  Mild atherosclerotic changes.  Carotid Doppler  No significant extracranial stenosis   2D Echo  pending   LDL 82  HgbA1c >15.5  Heparin 5000 units sq tid for VTE prophylaxis  aspirin 81  mg daily prior to admission, now on No antithrombotic. Given  mild stroke, will place on aspirin 81 mg and plavix 75 mg daily x 3 weeks, then plavix alone. Orders adjusted. May readjust pending carotid findings.  Therapy recommendations:  CIR. Consult placed.  Disposition:  pending   Hx Hypertension Hypotension in setting of hypovolemia, resolved  Stable . Permissive hypertension (OK if < 220/120) but gradually normalize in 5-7 days . Long-term BP goal  normotensive  Hyperlipidemia  Home meds: Crestor 20  Crestor increased to 40 mg in hospital  LDL 82, goal < 70  Continue statin at discharge  Diabetes type II, new diagnosis  Admitted for DKA, glucose is greater than 800   HgbA1c greater than 15.5, goal < 7.0  Uncontrolled  Other Stroke Risk Factors  Other Active Problems  Respiratory insufficiency in setting of DKA  Severe anion gap metabolic acidosis, hyperchloremic secondary to DKA  Hospital day # 4 I have personally examined this patient, reviewed notes, independently viewed imaging studies, participated in medical decision making and plan of care.ROS completed by me personally and pertinent positives fully documented  I have made any additions or clarifications directly to the above note.   She has presented with embolic left MCA branch infarcts in setting of diabetic ketoacidosis. Recommend checking echo and echocardiogram and CT angiogram of the neck even though the carotid ultrasound is unremarkable as patient may have a more proximal stenosisin the neck not evaluated by carotid ultrasound Continue antiplatelet therapy and aggressive risk factor modification. Discussed with patient and Dr. Clementeen Graham  e. Greater than 50% time during this 25 minute visitt was spent in counseling and cognition of care about her embolic stroke and answered questions.  Antony Contras, MD Medical Director [facility] Pager: [phone] 07/10/2017 2:54 PM     To contact Stroke Continuity provider, please refer to [email or web address] After hours, contact General Neurology

## 2017-07-10 NOTE — Progress Notes (Addendum)
PROGRESS NOTE                                                                                                                                                                                                             Patient Demographics:    Sandra Shelton, is a 62 y.o. female, DOB - September 09, 1955, IHK:[id number]  Admit date - 07/05/2017   Admitting Physician Kandice Hams, MD  Outpatient Primary MD for the patient is Lin Landsman, MD  LOS - 4  Outpatient Specialists: none  Chief Complaint  Patient presents with  . Altered Mental Status  . Hypotension       Brief Narrative   62 year old female with history of hypertension was found to be obtunded at home.  Patient informed her sister several hours before when they spoke on the phone that she was not feeling good and could not get out of bed.  Patient also reported feeling tired, weight loss and polydipsia for past 2 weeks and recent dental abscess.  EMS found patient to be nonresponsive and glucose read high on the monitor.  In the ED she was hypotensive with blood pressure of 76/50 mmHg, tachycardic, lactic acid of 10, WBC of 18.2, glucose of 1265.  Patient admitted to ICU. She was also found to have acute left MCA infarct.  Stroke team consulted.  Patient transferred to hospital service on 6/26.   Subjective:   Patient feels tired today but denies any weakness.  Has slow speech.  Assessment  & Plan :   Principal problem  newly diagnosed uncontrolled diabetes mellitus with diabetes ketoacidosis Admitted to ICU and managed with IV insulin, aggressive IV hydration.  A1c of 15.5.  CBG now stable on Lantus 10 units at bedtime along with sliding scale coverage.  Aggressive lifestyle modification with diet, exercise and medications.  New acute left MCA stroke   on 6/24 (3 days after hospitalization)  she was noted to have facial droop and diminished right hand grip.   Head CT was ordered showing subacute left frontal and left caudate infarcts.  MRI brain showed patchy multifocal early subacute left MCA infarct.  MRA also concerning for high grade left carotid stenosis. 2D echo shows LVH with vigorous LV function of 75-64%, grade 1 diastolic dysfunction.  Carotid ultrasound per stroke team doesnot show significant stenosis, ordered CTA neck  to evaluate further.  LDL of 82, started on statin. Stroke team recommends aspirin 81 mg daily along with Plavix 75 mg daily for 3 weeks then Plavix alone. Carotid Doppler Residual facial weakness and right upper extremity weakness.  Patient has good strength on the right upper extremity on my exam today). Appreciate stroke team involvement.  Aggressive secondary stroke prevention.  PT recommends CIR.  Being evaluated.  Severe sepsis Secondary to DKA, possible dental abscess.  On empiric 7-day course of Augmentin.  No facial or dental abscess seen on maxillofacial CT.  Sepsis now resolved.   Essential hypertension Hypotensive on presentation due to sepsis and DKA Now on amlodipine.  Stable.  Hypokalemia/hypophosphatemia Being replenished.  Acute kidney injury Prerenal on presentation. secondary to severe dehydration with DKA.  Resolved with IV fluids.  Acute metabolic encephalopathy Secondary to DKA/?  Sepsis.  Resolved.  Code Status : Full code  Family Communication: None at bedside  Disposition Plan  : Pending carotid ultrasound.  CIR possibly tomorrow if approved otherwise SNF.  Barriers For Discharge : Improving symptoms, pending CIR  Consults  : PCCM, stroke, CIR  Procedures  : CT head, CT maxillofacial, MRI brain/MRA head, 2D echo, vascular ultrasound  DVT Prophylaxis  :  Lovenox -   Lab Results  Component Value Date   PLT 121 (L) 07/10/2017    Antibiotics  :    Anti-infectives (From admission, onward)   Start     Dose/Rate Route Frequency Ordered Stop   07/09/17 1200   amoxicillin-clavulanate (AUGMENTIN) 875-125 MG per tablet 1 tablet     1 tablet Oral Every 12 hours 07/09/17 1017 07/13/17 0959   07/07/17 1400  clindamycin (CLEOCIN) IVPB 600 mg  Status:  Discontinued     600 mg 100 mL/hr over 30 Minutes Intravenous Every 8 hours 07/07/17 1119 07/09/17 1017   07/06/17 0315  vancomycin (VANCOCIN) IVPB 1000 mg/200 mL premix  Status:  Discontinued     1,000 mg 200 mL/hr over 60 Minutes Intravenous Every 48 hours 07/06/17 0307 07/07/17 1042   07/06/17 0315  piperacillin-tazobactam (ZOSYN) IVPB 2.25 g  Status:  Discontinued     2.25 g 100 mL/hr over 30 Minutes Intravenous Every 8 hours 07/06/17 0307 07/07/17 1042        Objective:   Vitals:   07/09/17 1614 07/09/17 2104 07/10/17 0517 07/10/17 0735  BP: (!) 132/43 (!) 140/56 (!) 147/62 (!) 154/61  Pulse: 77 74 69 68  Resp: 18 16 16 18   Temp: 97.7 F (36.5 C) 98.8 F (37.1 C) 98.1 F (36.7 C) 98.1 F (36.7 C)  TempSrc: Oral Oral Oral Oral  SpO2: 100% 100% 100% 97%  Weight:      Height:        Wt Readings from Last 3 Encounters:  07/08/17 80 kg (176 lb 5.9 oz)     Intake/Output Summary (Last 24 hours) at 07/10/2017 1417 Last data filed at 07/10/2017 1320 Gross per 24 hour  Intake 540 ml  Output 0 ml  Net 540 ml     Physical Exam  Gen: not in distress HEENT: moist mucosa, supple neck Chest: clear b/l, no added sounds CVS: N S1&S2, no murmurs, rubs or gallop GI: soft, NT, ND, BS+ Musculoskeletal: warm, no edema CNS: Alert and oriented, slow speech, mild left facial droop, normal strength in right extremities.    Data Review:    CBC Recent Labs  Lab 07/05/17 2242  07/06/17 0107 07/07/17 7989 07/08/17 0251 07/09/17 2119 07/10/17 0441  WBC 18.2*  --   --  23.4* 18.5* 12.4* 7.9  HGB 18.4*   < > 17.3* 13.2 13.8 12.2 12.5  HCT 64.5*   < > 51.0* 40.9 43.2 39.1 39.9  PLT 329  --   --  149* 121* 102* 121*  MCV 100.0  --   --  87.6 88.3 89.5 89.5  MCH 28.5  --   --  28.3 28.2 27.9  28.0  MCHC 28.5*  --   --  32.3 31.9 31.2 31.3  RDW 13.8  --   --  13.2 13.9 14.0 13.6  LYMPHSABS 1.0  --   --   --   --   --   --   MONOABS 0.4  --   --   --   --   --   --   EOSABS 0.0  --   --   --   --   --   --   BASOSABS 0.1  --   --   --   --   --   --    < > = values in this interval not displayed.    Chemistries  Recent Labs  Lab 07/05/17 2242  07/06/17 0201  07/07/17 0312 07/07/17 0938 07/07/17 1547 07/08/17 0251 07/08/17 1539 07/09/17 0646 07/10/17 0441  NA 149*   < > 159*   < >  --  159* 155* 161* 156* 150* 147*  K 3.2*   < > 2.6*   < >  --  3.6 4.2 3.1*  --  2.9* 3.3*  CL 102   < > 124*   < >  --  >130* 124* 128*  --  117* 110  CO2 10*  --  16*   < >  --  21* 19* 23  --  26 25  GLUCOSE 1,265*   < > 662*   < >  --  221* 355* 141*  --  124* 143*  BUN 103*   < > 90*   < >  --  60* 49* 36*  --  15 13  CREATININE 5.31*   < > 4.13*   < >  --  2.62* 2.25* 1.83*  --  1.12* 1.01*  CALCIUM 11.3*  --  9.2   < >  --  8.8* 8.6* 8.9  --  8.1* 8.4*  MG  --   --  3.8*  --  2.9*  --   --   --   --  1.8  --   AST 30  --   --   --   --   --   --   --   --   --   --   ALT 21  --   --   --   --   --   --   --   --   --   --   ALKPHOS 82  --   --   --   --   --   --   --   --   --   --   BILITOT 1.4*  --   --   --   --   --   --   --   --   --   --    < > = values in this interval not displayed.   ------------------------------------------------------------------------------------------------------------------ Recent Labs    07/08/17 1539 07/09/17 0646  CHOL 186 180  HDL 19* 22*  LDLCALC  UNABLE TO CALCULATE IF TRIGLYCERIDE OVER 400 mg/dL 82  TRIG 462* 380*  CHOLHDL 9.8 8.2    Lab Results  Component Value Date   HGBA1C >15.5 (H) 07/06/2017   ------------------------------------------------------------------------------------------------------------------ Recent Labs    07/08/17 1539  TSH 0.496    ------------------------------------------------------------------------------------------------------------------ No results for input(s): VITAMINB12, FOLATE, FERRITIN, TIBC, IRON, RETICCTPCT in the last 72 hours.  Coagulation profile Recent Labs  Lab 07/05/17 2242  INR 1.23    No results for input(s): DDIMER in the last 72 hours.  Cardiac Enzymes Recent Labs  Lab 07/06/17 0643 07/06/17 1346 07/06/17 1514  TROPONINI 0.10* 0.20* 0.39*   ------------------------------------------------------------------------------------------------------------------ No results found for: BNP  Inpatient Medications  Scheduled Meds: . amLODipine  5 mg Oral Daily  . amoxicillin-clavulanate  1 tablet Oral Q12H  . aspirin  81 mg Oral Daily  . clopidogrel  75 mg Oral Daily  . heparin injection (subcutaneous)  5,000 Units Subcutaneous Q8H  . insulin aspart  0-15 Units Subcutaneous TID WC  . insulin aspart  0-5 Units Subcutaneous QHS  . insulin glargine  10 Units Subcutaneous Daily  . rosuvastatin  40 mg Oral q1800   Continuous Infusions: PRN Meds:.  Micro Results Recent Results (from the past 240 hour(s))  Blood culture (routine x 2)     Status: None   Collection Time: 07/05/17 10:30 PM  Result Value Ref Range Status   Specimen Description BLOOD RIGHT ANTECUBITAL  Final   Special Requests   Final    BOTTLES DRAWN AEROBIC AND ANAEROBIC Blood Culture results may not be optimal due to an inadequate volume of blood received in culture bottles   Culture   Final    NO GROWTH 5 DAYS Performed at [facility], [place] [address], [place], [place] [address]    Report Status 07/10/2017 FINAL  Final  Blood culture (routine x 2)     Status: None   Collection Time: 07/05/17 10:45 PM  Result Value Ref Range Status   Specimen Description BLOOD LEFT ARM  Final   Special Requests   Final    BOTTLES DRAWN AEROBIC AND ANAEROBIC Blood Culture adequate volume   Culture   Final    NO GROWTH 5  DAYS Performed at [facility], [place] [address], [place], [place] [address]    Report Status 07/10/2017 FINAL  Final  Urine culture     Status: None   Collection Time: 07/05/17 11:49 PM  Result Value Ref Range Status   Specimen Description URINE, CATHETERIZED  Final   Special Requests NONE  Final   Culture   Final    NO GROWTH Performed at [facility], [place] [address], [place], [place] [address]    Report Status 07/07/2017 FINAL  Final  MRSA PCR Screening     Status: None   Collection Time: 07/06/17  3:11 AM  Result Value Ref Range Status   MRSA by PCR NEGATIVE NEGATIVE Final    Comment:        The GeneXpert MRSA Assay (FDA approved for NASAL specimens only), is one component of a comprehensive MRSA colonization surveillance program. It is not intended to diagnose MRSA infection nor to guide or monitor treatment for MRSA infections. Performed at [facility], [place] [address] [place], [place] [address]     Radiology Reports Ct Head Wo Contrast  Result Date: 07/08/2017 CLINICAL DATA:  62 year old female with altered mental status and RIGHT facial pain and swelling. EXAM: CT HEAD  WITHOUT CONTRAST CT MAXILLOFACIAL WITHOUT CONTRAST TECHNIQUE: Multidetector CT imaging of the head and maxillofacial structures were performed using the standard protocol without intravenous contrast. Multiplanar CT image reconstructions of the maxillofacial structures were also generated. COMPARISON:  07/05/2017 head CT FINDINGS: CT HEAD FINDINGS Brain: Two small to moderate LEFT frontal infarcts and a LEFT caudate infarct appears acute to subacute and new on CT since 07/05/2017. There is no evidence of hemorrhage or mass effect. Atrophy and mild chronic small-vessel white matter ischemic changes are again noted. No midline shift, hydrocephalus or extra-axial collection identified. Vascular: Atherosclerotic calcifications again noted. Skull: Normal. Negative for fracture or focal lesion.  Other: None. CT MAXILLOFACIAL FINDINGS Osseous: No fracture or mandibular dislocation. No destructive process. Orbits: Negative. No traumatic or inflammatory finding. Sinuses: Clear. Soft tissues: Negative. IMPRESSION: 1. 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 No hemorrhage or mass effect. 2. Mild atrophy and chronic small-vessel white matter ischemic changes. 3. No significant facial abnormality. Electronically Signed   By: Margarette Canada M.D.   On: 07/08/2017 14:43   Ct Head Wo Contrast  Result Date: 07/06/2017 CLINICAL DATA:  Altered level of consciousness. Patient was found in bed. Responsive to painful stimuli. EXAM: CT HEAD WITHOUT CONTRAST TECHNIQUE: Contiguous axial images were obtained from the base of the skull through the vertex without intravenous contrast. COMPARISON:  MRI brain 10/28/2011 FINDINGS: Brain: Mild cerebral atrophy. No evidence of acute infarction, hemorrhage, hydrocephalus, extra-axial collection or mass lesion/mass effect. Vascular: Mild intracranial arterial vascular calcifications are present. Skull: Normal. Negative for fracture or focal lesion. Sinuses/Orbits: No acute finding. Other: None. IMPRESSION: No acute intracranial abnormalities.  Mild chronic atrophy. Electronically Signed   By: Lucienne Capers M.D.   On: 07/06/2017 00:48   Mr Brain Wo Contrast  Result Date: 07/09/2017 CLINICAL DATA:  Initial evaluation for follow-up examination for acute stroke. EXAM: MRI HEAD WITHOUT CONTRAST MRA HEAD WITHOUT CONTRAST TECHNIQUE: Multiplanar, multiecho pulse sequences of the brain and surrounding structures were obtained without intravenous contrast. Angiographic images of the head were obtained using MRA technique without contrast. COMPARISON:  Prior CT from 07/08/2017. FINDINGS: MRI HEAD FINDINGS Brain: Cerebral volume within normal limits for age. No significant cerebral white matter changes. There are patchy multifocal acute to early  subacute ischemic infarcts involving the cortical gray matter and underlying subcortical white matter of the left frontal and parietal temporal regions, consistent with left MCA territory infarcts. Patchy involvement of the caudate. No associated mass effect or evidence for hemorrhage. No other acute or subacute ischemia. Gray-white matter differentiation otherwise maintained. No evidence for acute or chronic intracranial hemorrhage. No mass lesion, midline shift or mass effect. No hydrocephalus. No extra-axial fluid collection. Pituitary gland within normal limits. Vascular: Left ICA demonstrates a diminutive vascular flow void. Major intracranial vascular flow voids otherwise maintained. Skull and upper cervical spine: Craniocervical junction within normal limits. Upper cervical spine normal. Bone marrow signal intensity normal. No scalp soft tissue abnormality. Sinuses/Orbits: Globes orbital soft tissues within normal limits. Mild scattered mucosal thickening within the ethmoidal air cells. Paranasal sinuses are otherwise clear. No mastoid effusion. Other: None. MRA HEAD FINDINGS ANTERIOR CIRCULATION: Examination somewhat degraded by motion artifact. The left ICA is diminutive to the level of the terminus, of uncertain chronicity. Possible superimpose mild stenosis at the distal cervical left ICA. Right ICA patent to the terminus without hemodynamically significant stenosis. Mild carotid siphon atherosclerotic change. A1 segments, anterior communicating artery common anterior cerebral arteries patent without stenosis.  M1 segments patent without stenosis or occlusion. There is overall decreased attenuation throughout the left MCA territory as compared to the right. No proximal M2 occlusion. Mild distal small vessel atheromatous irregularity within the MCA branches, most notable on the left. POSTERIOR CIRCULATION: Dominant left vertebral artery patent to the vertebrobasilar junction. Hypoplastic right vertebral  artery largely terminates in PICA, although a small branch ascending towards the vertebrobasilar junction. Right PICA patent. Left PICA not visualized. Basilar patent to its distal aspect without stenosis. Superior cerebellar arteries patent bilaterally. Short-segment moderate proximal right P2 stenosis, with additional mild narrowing distally. Left CCA widely patent. No intracranial aneurysm. IMPRESSION: MRI HEAD IMPRESSION: 1. Patchy multifocal acute to early subacute ischemic nonhemorrhagic left MCA territory infarcts. No significant mass effect. 2. Otherwise normal brain MRI for age. MRA HEAD IMPRESSION: 1. Diminutive left ICA as compared to the right, with overall decreased and attenuated flow within the left MCA distribution. Finding of unknown chronicity, although further evaluation with vascular imaging of the neck is suggested to fully evaluate these findings. 2. Mild atherosclerotic change involving the intracranial circulation for age. No proximal high-grade or correctable stenosis identified. Electronically Signed   By: Jeannine Boga M.D.   On: 07/09/2017 06:29   Dg Chest Port 1 View  Result Date: 07/06/2017 CLINICAL DATA:  Follow-up examination status post failed central line placement. EXAM: PORTABLE CHEST 1 VIEW COMPARISON:  Prior radiograph from 07/05/2017. FINDINGS: Cardiac and mediastinal silhouettes are stable in size and contour, and remain within normal limits. Aortic atherosclerosis. Lungs are hypoinflated. No new focal airspace disease. No pulmonary edema or pleural effusion. No pneumothorax. No acute osseous abnormality. IMPRESSION: Stable appearance of the chest. No pneumothorax or other complication status post attempted central line placement. Electronically Signed   By: Jeannine Boga M.D.   On: 07/06/2017 02:58   Dg Chest Portable 1 View  Result Date: 07/05/2017 CLINICAL DATA:  62 year old female with altered mental status today. EXAM: PORTABLE CHEST 1 VIEW  COMPARISON:  None. FINDINGS: Portable AP supine view at 2259 hours. Rotated to the right. Mildly low lung volumes. Allowing for portable technique the lungs are clear. Normal cardiac size and mediastinal contours. Visualized tracheal air column is within normal limits. No pneumothorax. Paucity of bowel gas in the upper abdomen. No acute osseous abnormality identified. IMPRESSION: No acute cardiopulmonary abnormality. Electronically Signed   By: Genevie Ann M.D.   On: 07/05/2017 23:35   Mr Virgel Paling VO Contrast  Result Date: 07/09/2017 CLINICAL DATA:  Initial evaluation for follow-up examination for acute stroke. EXAM: MRI HEAD WITHOUT CONTRAST MRA HEAD WITHOUT CONTRAST TECHNIQUE: Multiplanar, multiecho pulse sequences of the brain and surrounding structures were obtained without intravenous contrast. Angiographic images of the head were obtained using MRA technique without contrast. COMPARISON:  Prior CT from 07/08/2017. FINDINGS: MRI HEAD FINDINGS Brain: Cerebral volume within normal limits for age. No significant cerebral white matter changes. There are patchy multifocal acute to early subacute ischemic infarcts involving the cortical gray matter and underlying subcortical white matter of the left frontal and parietal temporal regions, consistent with left MCA territory infarcts. Patchy involvement of the caudate. No associated mass effect or evidence for hemorrhage. No other acute or subacute ischemia. Gray-white matter differentiation otherwise maintained. No evidence for acute or chronic intracranial hemorrhage. No mass lesion, midline shift or mass effect. No hydrocephalus. No extra-axial fluid collection. Pituitary gland within normal limits. Vascular: Left ICA demonstrates a diminutive vascular flow void. Major intracranial vascular flow voids otherwise maintained. Skull and  upper cervical spine: Craniocervical junction within normal limits. Upper cervical spine normal. Bone marrow signal intensity normal. No  scalp soft tissue abnormality. Sinuses/Orbits: Globes orbital soft tissues within normal limits. Mild scattered mucosal thickening within the ethmoidal air cells. Paranasal sinuses are otherwise clear. No mastoid effusion. Other: None. MRA HEAD FINDINGS ANTERIOR CIRCULATION: Examination somewhat degraded by motion artifact. The left ICA is diminutive to the level of the terminus, of uncertain chronicity. Possible superimpose mild stenosis at the distal cervical left ICA. Right ICA patent to the terminus without hemodynamically significant stenosis. Mild carotid siphon atherosclerotic change. A1 segments, anterior communicating artery common anterior cerebral arteries patent without stenosis. M1 segments patent without stenosis or occlusion. There is overall decreased attenuation throughout the left MCA territory as compared to the right. No proximal M2 occlusion. Mild distal small vessel atheromatous irregularity within the MCA branches, most notable on the left. POSTERIOR CIRCULATION: Dominant left vertebral artery patent to the vertebrobasilar junction. Hypoplastic right vertebral artery largely terminates in PICA, although a small branch ascending towards the vertebrobasilar junction. Right PICA patent. Left PICA not visualized. Basilar patent to its distal aspect without stenosis. Superior cerebellar arteries patent bilaterally. Short-segment moderate proximal right P2 stenosis, with additional mild narrowing distally. Left CCA widely patent. No intracranial aneurysm. IMPRESSION: MRI HEAD IMPRESSION: 1. Patchy multifocal acute to early subacute ischemic nonhemorrhagic left MCA territory infarcts. No significant mass effect. 2. Otherwise normal brain MRI for age. MRA HEAD IMPRESSION: 1. Diminutive left ICA as compared to the right, with overall decreased and attenuated flow within the left MCA distribution. Finding of unknown chronicity, although further evaluation with vascular imaging of the neck is suggested  to fully evaluate these findings. 2. Mild atherosclerotic change involving the intracranial circulation for age. No proximal high-grade or correctable stenosis identified. Electronically Signed   By: Jeannine Boga M.D.   On: 07/09/2017 06:29   Ct Maxillofacial Wo Contrast  Result Date: 07/08/2017 CLINICAL DATA:  62 year old female with altered mental status and RIGHT facial pain and swelling. EXAM: CT HEAD WITHOUT CONTRAST CT MAXILLOFACIAL WITHOUT CONTRAST TECHNIQUE: Multidetector CT imaging of the head and maxillofacial structures were performed using the standard protocol without intravenous contrast. Multiplanar CT image reconstructions of the maxillofacial structures were also generated. COMPARISON:  07/05/2017 head CT FINDINGS: CT HEAD FINDINGS Brain: Two small to moderate LEFT frontal infarcts and a LEFT caudate infarct appears acute to subacute and new on CT since 07/05/2017. There is no evidence of hemorrhage or mass effect. Atrophy and mild chronic small-vessel white matter ischemic changes are again noted. No midline shift, hydrocephalus or extra-axial collection identified. Vascular: Atherosclerotic calcifications again noted. Skull: Normal. Negative for fracture or focal lesion. Other: None. CT MAXILLOFACIAL FINDINGS Osseous: No fracture or mandibular dislocation. No destructive process. Orbits: Negative. No traumatic or inflammatory finding. Sinuses: Clear. Soft tissues: Negative. IMPRESSION: 1. 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 No hemorrhage or mass effect. 2. Mild atrophy and chronic small-vessel white matter ischemic changes. 3. No significant facial abnormality. Electronically Signed   By: Margarette Canada M.D.   On: 07/08/2017 14:43    Time Spent in minutes  25   Lonita Debes M.D on 07/10/2017 at 2:17 PM  Between 7am to 7pm - Pager - [phone]  After 7pm go to [email or web address] - password [email or web address]  [facility] -  Office   [phone]

## 2017-07-10 NOTE — Progress Notes (Addendum)
Carotid duplex prelim: 1-39% ICA stenosis. Imaging was very technically difficult due to depth of vessels and poor tissue interface.   Landry Mellow, RDMS, RVT

## 2017-07-10 NOTE — Plan of Care (Signed)
  RD consulted for nutrition education regarding diabetes.   Lab Results  Component Value Date   HGBA1C >15.5 (H) 07/06/2017    RD provided "Carbohydrate Counting for People with Diabetes" handout from the Academy of [employer]. Discussed different food groups and their effects on blood sugar, emphasizing carbohydrate-containing foods. Provided list of carbohydrates and recommended serving sizes of common foods.  Discussed importance of controlled and consistent carbohydrate intake throughout the day. Provided examples of ways to balance meals/snacks and encouraged intake of high-fiber, whole grain complex carbohydrates. Teach back method used.  Patient's sister available for part of the education. Recommend referral to [facility] for outpatient education. Discussed this with pt and pt's sister  Expect good compliance. Recommend further education  Body mass index is 28.47 kg/m.   Current diet order is Dysphagia III, patient is consuming approximately 75-100%% of meals at this time. Carb Modified added to current diet order. Labs and medications reviewed. Phosphorus 1.8, text paged MD with recommendation of supplementation. No further nutrition interventions warranted at this time. RD contact information provided. If additional nutrition issues arise, please re-consult RD.  Kerman Passey MS, RD, [place], [place] [phone] Pager  [phone] Weekend/On-Call Pager

## 2017-07-10 NOTE — Progress Notes (Signed)
Inpatient Diabetes Program Recommendations  AACE/ADA: New Consensus Statement on Inpatient Glycemic Control (2015)  Target Ranges:  Prepandial:   less than 140 mg/dL      Peak postprandial:   less than 180 mg/dL (1-2 hours)      Critically ill patients:  140 - 180 mg/dL   Lab Results  Component Value Date   GLUCAP 152 (H) 07/10/2017   HGBA1C >15.5 (H) 07/06/2017    Review of Glycemic Control  Results for Sandra Shelton, Sandra Shelton (MRN [id number]) as of 07/10/2017 11:03  Ref. Range 07/09/2017 10:58 07/09/2017 16:13 07/09/2017 21:03 07/10/2017 07:34  Glucose-Capillary Latest Ref Range: 70 - 99 mg/dL 97 202 (H) 159 (H) 152 (H)   Diabetes history: New onset Outpatient Diabetes medications: none Current orders for Inpatient glycemic control: Novolog 0-15 units Q4H, Lantus 10 units QD   Inpatient Diabetes Program Recommendations:    Met with patient and family again for diabetes education. Reiterated the need for insulin outpatient.   Discussed differences between type 1 and type 2 diabetes and patho of both. Reviewed normal glucose targets and had the patient verbalize normal BS levels. Spent time educating patient on survival skills and interventions when BS exceed 300 or fall below 70 mg/dL, and when to call provider. Educated patient on nutritional labels and targets for carbohydrates per meal.   Educated patient and neice on insulin pen use at home. Reviewed contents of insulin flexpen starter kit. Reviewed all steps if insulin pen including attachment of needle, 2-unit air shot, dialing up dose, giving injection, removing needle, disposal of sharps, storage of unused insulin, disposal of insulin etc. Patient's niece able to provide independent return demonstration and the patient is able to provide successful return demonstration with some assistance. Also, reviewed troubleshooting with insulin pen. MD to give patient Rxs for insulin pens and insulin pen needles.  While at bedside patient self  injected AM dose of Lantus using insulin syringe. Reviewed basic of vial and syringe as well and support person able to draw up liquid from vial.    Discussed the need for meter, testing strips and lancets. Plan for purchase at [facility], handout provided. Provided handout for [name] outpatient education and patient plans to go with family. Seems that patient has great support and will go to inpatient rehab.  Spoke with the RN and encouraged to continue helping patient with self injections and checking BS.   Thanks, Bronson Curb, MSN, RNC-OB Diabetes Coordinator [phone] (8a-5p)

## 2017-07-10 NOTE — Progress Notes (Signed)
Physical Therapy Treatment Patient Details Name: Sandra Shelton MRN: [id number] DOB: 1955-01-25 Today's Date: 07/10/2017    History of Present Illness Presents to ED on 6/21 after being found obtunded at home. Upon arrival to ED BP 76/50. Afebrile, Tachycardiac. Glucose 1265. 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 DKA. No previous hx of DM.    PT Comments    Patient progressing slowly towards her goals. Continues to ambulate with min assist for balance with slow gait speed and stride length. Current gait speed indicates that patient is at high risk for falls. Session focused on dual tasking with cognitive and physical tasks while ambulating. Patient requiring significantly increased time with max cueing for category naming and physical dual tasking i.e. Reading room numbers while walking. Patient continues to be an excellent candidate for outpatient PT based on her motivation, PLOF, and current deficits to be addressed.     Follow Up Recommendations  CIR     Equipment Recommendations  None recommended by PT(TBD)    Recommendations for Other Services Rehab consult     Precautions / Restrictions Precautions Precautions: Fall Restrictions Weight Bearing Restrictions: No    Mobility  Bed Mobility Overal bed mobility: Needs Assistance Bed Mobility: Supine to Sit     Supine to sit: Supervision        Transfers Overall transfer level: Needs assistance Equipment used: 1 person hand held assist Transfers: Sit to/from Stand Sit to Stand: Min guard            Ambulation/Gait Ambulation/Gait assistance: Min assist Gait Distance (Feet): 180 Feet Assistive device: 1 person hand held assist Gait Pattern/deviations: Step-through pattern;Decreased stride length;Narrow base of support Gait velocity: slow Gait velocity interpretation: <1.31 ft/sec, indicative of  household ambulator General Gait Details: Patient with very slow gait with decreased stride length. Cues for reciprocal arm swing and increased right step length.  pt with minimal navigation skills requiring max directional verbal cues to find her room. focus on dual tasking with cognitive tasks during ambulation; noted decreased gait speed and occasional need to stop in order to participate.   Stairs             Wheelchair Mobility    Modified Rankin (Stroke Patients Only) Modified Rankin (Stroke Patients Only) Pre-Morbid Rankin Score: No symptoms Modified Rankin: Moderate disability     Balance Overall balance assessment: Needs assistance Sitting-balance support: Feet supported Sitting balance-Leahy Scale: Good     Standing balance support: During functional activity Standing balance-Leahy Scale: Fair                              Cognition Arousal/Alertness: Awake/alert Behavior During Therapy: WFL for tasks assessed/performed Overall Cognitive Status: Impaired/Different from baseline Area of Impairment: Problem solving                   Current Attention Level: Sustained Memory: Decreased recall of precautions;Decreased short-term memory(intact immediate recall; poor delayed recall) Following Commands: Follows multi-step commands inconsistently Safety/Judgement: Decreased awareness of safety;Decreased awareness of deficits Awareness: Emergent Problem Solving: Slow processing;Difficulty sequencing;Requires verbal cues;Requires tactile cues General Comments: (unable to recall room number after 1 min delay; difficulty f)      Exercises      General Comments        Pertinent Vitals/Pain Pain Assessment: No/denies pain    Home Living  Prior Function            PT Goals (current goals can now be found in the care plan section) Acute Rehab PT Goals Patient Stated Goal: to get better PT Goal Formulation: With  patient/family Time For Goal Achievement: 08/22/17 Potential to Achieve Goals: Good Progress towards PT goals: Progressing toward goals    Frequency    Min 4X/week      PT Plan Current plan remains appropriate    Co-evaluation              AM-PAC PT "6 Clicks" Daily Activity  Outcome Measure  Difficulty turning over in bed (including adjusting bedclothes, sheets and blankets)?: None Difficulty moving from lying on back to sitting on the side of the bed? : None Difficulty sitting down on and standing up from a chair with arms (e.g., wheelchair, bedside commode, etc,.)?: A Little Help needed moving to and from a bed to chair (including a wheelchair)?: A Little Help needed walking in hospital room?: A Little Help needed climbing 3-5 steps with a railing? : A Little 6 Click Score: 20    End of Session Equipment Utilized During Treatment: Gait belt Activity Tolerance: Patient tolerated treatment well Patient left: with call bell/phone within reach;with chair alarm set;in chair Nurse Communication: Mobility status PT Visit Diagnosis: Unsteadiness on feet (R26.81);Hemiplegia and hemiparesis Hemiplegia - Right/Left: Right Hemiplegia - caused by: Cerebral infarction     Time: 7356-7014 PT Time Calculation (min) (ACUTE ONLY): 20 min  Charges:  $Therapeutic Activity: 8-22 mins                    G Codes:       [name], PT, DPT Acute Rehabilitation Services  Pager: [place] 07/10/2017, 5:22 PM

## 2017-07-10 NOTE — Progress Notes (Signed)
Inpatient Rehabilitation Admissions Coordinator  I await further therapy progress and return call from pt's Employer for medical insurance coverage to be able to assist pt with planning dispo.  Danne Baxter, RN, MSN Rehab Admissions Coordinator [phone] 07/10/2017 4:24 PM

## 2017-07-11 ENCOUNTER — Encounter (HOSPITAL_COMMUNITY): Payer: Self-pay | Admitting: Physician Assistant

## 2017-07-11 DIAGNOSIS — A419 Sepsis, unspecified organism: Principal | ICD-10-CM

## 2017-07-11 DIAGNOSIS — N179 Acute kidney failure, unspecified: Secondary | ICD-10-CM

## 2017-07-11 DIAGNOSIS — E1310 Other specified diabetes mellitus with ketoacidosis without coma: Secondary | ICD-10-CM

## 2017-07-11 DIAGNOSIS — E1011 Type 1 diabetes mellitus with ketoacidosis with coma: Secondary | ICD-10-CM

## 2017-07-11 DIAGNOSIS — I6523 Occlusion and stenosis of bilateral carotid arteries: Secondary | ICD-10-CM

## 2017-07-11 LAB — GLUCOSE, CAPILLARY
GLUCOSE-CAPILLARY: 118 mg/dL — AB (ref 70–99)
GLUCOSE-CAPILLARY: 95 mg/dL (ref 70–99)
Glucose-Capillary: 248 mg/dL — ABNORMAL HIGH (ref 70–99)
Glucose-Capillary: 164 mg/dL — ABNORMAL HIGH (ref 70–99)

## 2017-07-11 MED ORDER — SACCHAROMYCES BOULARDII 250 MG PO CAPS
250.0000 mg | ORAL_CAPSULE | Freq: Two times a day (BID) | ORAL | Status: DC
  Administered 2017-07-11 – 2017-07-13 (×5): 250 mg via ORAL
  Filled 2017-07-11 (×5): qty 1

## 2017-07-11 NOTE — H&P (View-Only) (Signed)
Hospital Consult    Reason for Consult:  Carotid stenosis Requesting Physician:  Erlinda Hong MRN #:  [id number]   History of Present Illness:   Sandra Shelton is a 62 y.o. (August 31, 1955) female who was admitted on 07/05/17 after she was found obtunded at home.  She was brought to the hospital by EMS in DKA.  Her glucose in the ER was greater than 1200.  During her hospitalization, she was found to have right sided weakness.  She states she has never had this in the past but it was present earlier in this admission.  She doesn't feel like she has had much weakness in her right leg and feels her right hand is back at baseline.  She denies any clumsiness or numbness.  She denies ever having any speech difficulties or visual problems.    Neurology was consulted.   CT of head did show positive for subacute left frontal and left caudate infarcts.  She has not had a stroke prior to this.  A carotid duplex was obtained and revealed 1-39% stenosis bilaterally, but was technically difficult.  MRA of the brain suggested diminished flow and caliber throughout the left carotid suggesting high grade proximal stenosis.  [facility] is consulted to evaluate pt.    The pt is on a statin for cholesterol management.  She takes a daily aspirin.  She has been placed on Plavix during this admission.  Prior to admission, she was on oral agents for diabetes.  She is on clonidine and Dyazide for blood pressure management.   She denies hx of chest pain, MI, hx DVT or claudication sx.  She had surgery for a uterine fibroid in the mid 2000's.   She has never smoked.  She works to help homeless veterans find housing.  She is from [place] and has lived here for 5 years.  Her mom is deceased but had a hx of stroke.  Her father is living at age 29 and has hx of cancer.   Past Medical History:  Diagnosis Date  . Hypertension   DM, uncontrolled Dental abscess  Past Surgical History:  Procedure Laterality Date  . UTERINE FIBROID SURGERY        No Known Allergies  Prior to Admission medications   Medication Sig Start Date End Date Taking? Authorizing Provider  aspirin 81 MG tablet Take 81 mg by mouth daily.    [provider]  betamethasone dipropionate (DIPROLENE) 0.05 % cream Apply topically 2 (two) times daily. Patient not taking: Reported on 07/06/2017 08/16/14   Billy Fischer, MD  cloNIDine (CATAPRES) 0.2 MG tablet Take 0.2 mg by mouth 2 (two) times daily.    [provider]  meclizine (ANTIVERT) 25 MG tablet Take 1 tablet (25 mg total) by mouth 4 (four) times daily. Patient not taking: Reported on 07/06/2017 10/28/11   Lacretia Leigh, MD  metFORMIN (GLUCOPHAGE) 500 MG tablet Take 500 mg by mouth 2 (two) times daily with a meal.    [provider]  metoCLOPramide (REGLAN) 10 MG tablet Take 10 mg by mouth 4 (four) times daily.    [provider]  rosuvastatin (CRESTOR) 20 MG tablet Take 20 mg by mouth daily.    [provider]  triamterene-hydrochlorothiazide (DYAZIDE) 37.5-25 MG capsule Take 1 capsule by mouth daily.    [provider]   Statin:  Yes.   Beta Blocker:  No. Aspirin:  Yes.   ACEI:  No. ARB:  No. CCB use:  No Other antiplatelets/anticoagulants:  Yes.   Plavix (sq heparin DVT prophylaxis)  Social History   Socioeconomic History  . Marital status: Single    Spouse name: Not on file  . Number of children: Not on file  . Years of education: Not on file  . Highest education level: Not on file  Occupational History  . Not on file  Social Needs  . Financial resource strain: Not on file  . Food insecurity:    Worry: Not on file    Inability: Not on file  . Transportation needs:    Medical: Not on file    Non-medical: Not on file  Tobacco Use  . Smoking status: Never Smoker  . Smokeless tobacco: Never Used  Substance and Sexual Activity  . Alcohol use: No  . Drug use: No  . Sexual activity: Not on file  Lifestyle  . Physical activity:    Days  per week: Not on file    Minutes per session: Not on file  . Stress: Not on file  Relationships  . Social connections:    Talks on phone: Not on file    Gets together: Not on file    Attends religious service: Not on file    Active member of club or organization: Not on file    Attends meetings of clubs or organizations: Not on file    Relationship status: Not on file  . Intimate partner violence:    Fear of current or ex partner: Not on file    Emotionally abused: Not on file    Physically abused: Not on file    Forced sexual activity: Not on file  Other Topics Concern  . Not on file  Social History Narrative  . Not on file     Family History  Problem Relation Age of Onset  . Diabetes Mellitus II Sister   . Stroke Mother   . Cancer Father     ROS: [x]  Positive   [ ]  Negative   [ ]  All sytems reviewed and are negative  Cardiac: []  chest pain/pressure []  palpitations []  SOB lying flat []  DOE  Vascular: []  pain in legs while walking []  pain in legs at rest []  pain in legs at night []  non-healing ulcers []  hx of DVT []  swelling in legs  Pulmonary: []  productive cough []  asthma/wheezing []  home O2  Neurologic: [x]  weakness in [x]  right arm []  legs []  numbness in []  arms []  legs [x]  hx of CVA []  mini stroke [] difficulty speaking or slurred speech []  temporary loss of vision in one eye []  dizziness  Hematologic: []  hx of cancer []  bleeding problems []  problems with blood clotting easily  Endocrine:   [x]  diabetes []  thyroid disease  GI []  vomiting blood []  blood in stool  GU: []  CKD/renal failure []  HD--[]  M/W/F or []  T/T/S []  burning with urination []  blood in urine  Psychiatric: []  anxiety []  depression  Musculoskeletal: []  arthritis []  joint pain  Integumentary: []  rashes []  ulcers  Constitutional: []  fever []  chills   Physical Examination  Vitals:   07/11/17 0915 07/11/17 1011  BP: (!) 167/59 (!) 160/72  Pulse: 81 86    Resp: 20 18  Temp: 97.6 F (36.4 C) (!) 97.4 F (36.3 C)  SpO2: 100% 100%   Body mass index is 27.61 kg/m.  General:  WDWN in NAD sitting in bed eating lunch without difficulty Gait: Not observed HENT: WNL, normocephalic Pulmonary: normal non-labored breathing, without Rales, rhonchi,  wheezing Cardiac: regular,  without  Murmurs, rubs or gallops; with right carotid bruit Abdomen:  soft, NT/ND, no masses Skin: without rashes Vascular Exam/Pulses:  Right Left  Radial 2+ (normal) 2+ (normal)  Ulnar Unable to palpate  Unable to palpate   Femoral Not palpated Unable to palpate  Popliteal Unable to palpate  Unable to palpate   DP 2+ (normal) 2+ (normal)  PT Unable to palpate  Unable to palpate    Extremities: without ischemic changes, without Gangrene , without cellulitis; without open wounds;  Musculoskeletal: no muscle wasting or atrophy  Neurologic: A&O X 3;  No focal weakness or paresthesias are detected; speech is fluent/normal; strength in BLE equal; right hand grip essentially equal-possibly slightly less on the right but strong bilaterally. Psychiatric:  The pt has Normal affect. Lymph : No Cervical, Axillary, or Inguinal lymphadenopathy    Laboratory   CBC CBC Latest Ref Rng & Units 07/10/2017 07/09/2017 07/08/2017  WBC 4.0 - 10.5 K/uL 7.9 12.4(H) 18.5(H)  Hemoglobin 12.0 - 15.0 g/dL 12.5 12.2 13.8  Hematocrit 36.0 - 46.0 % 39.9 39.1 43.2  Platelets 150 - 400 K/uL 121(L) 102(L) 121(L)    BMP BMP Latest Ref Rng & Units 07/10/2017 07/09/2017 07/08/2017  Glucose 70 - 99 mg/dL 143(H) 124(H) -  BUN 8 - 23 mg/dL 13 15 -  Creatinine 0.44 - 1.00 mg/dL 1.01(H) 1.12(H) -  Sodium 135 - 145 mmol/L 147(H) 150(H) 156(H)  Potassium 3.5 - 5.1 mmol/L 3.3(L) 2.9(L) -  Chloride 98 - 111 mmol/L 110 117(H) -  CO2 22 - 32 mmol/L 25 26 -  Calcium 8.9 - 10.3 mg/dL 8.4(L) 8.1(L) -    Coagulation Lab Results  Component Value Date   INR 1.23 07/05/2017   No results found for:  PTT  Lipids    Component Value Date/Time   CHOL 180 07/09/2017 0646   TRIG 380 (H) 07/09/2017 0646   HDL 22 (L) 07/09/2017 0646   CHOLHDL 8.2 07/09/2017 0646   VLDL 76 (H) 07/09/2017 0646   LDLCALC 82 07/09/2017 0646   Hg A1c > 15.5  Non-invasive Vascular Imaging     Carotid duplex 07/10/17:  1-39% bilaterally  2D Echo 07/10/17: Study Conclusions  - Left ventricle: The cavity size was normal. There was moderate   septal hypertropy with otherwise mild concentric hypertrophy.   Systolic function was normal. The estimated ejection fraction was   in the range of 60% to 65%. There was dynamic obstruction at   rest, with a peak velocity of 167 cm/sec and a peak gradient of   11 mm Hg. Wall motion was normal; there were no regional wall   motion abnormalities. Doppler parameters are consistent with   abnormal left ventricular relaxation (grade 1 diastolic   dysfunction). - Aortic valve: Transvalvular velocity was within the normal range.   There was no stenosis. There was no regurgitation. - Mitral valve: Transvalvular velocity was within the normal range.   There was no evidence for stenosis. There was no regurgitation. - Right ventricle: The cavity size was normal. Wall thickness was   normal. Systolic function was normal. - Tricuspid valve: There was no regurgitation.   Radiology     Ct Angio Neck W Or Wo Contrast  Result Date: 07/10/2017 CLINICAL DATA:  Right facial asymmetry. EXAM: CT ANGIOGRAPHY NECK TECHNIQUE: Multidetector CT imaging of the neck was performed using the standard protocol during bolus administration of intravenous contrast. Multiplanar CT image reconstructions and MIPs were obtained to evaluate the vascular anatomy. Carotid stenosis  measurements (when applicable) are obtained utilizing NASCET criteria, using the distal internal carotid diameter as the denominator. CONTRAST:  53mL ISOVUE-370 IOPAMIDOL (ISOVUE-370) INJECTION 76% COMPARISON:  Head CT  07/08/2017 FINDINGS: Aortic arch: There is mild calcific atherosclerosis of the aortic arch. There is no aneurysm, dissection or hemodynamically significant stenosis of the visualized ascending aorta and aortic arch. Conventional 3 vessel aortic branching pattern. The visualized proximal subclavian arteries are widely patent. Right carotid system: --Common carotid artery: Approximately 50% narrowing of the midportion of the right common carotid artery. --Internal carotid artery: Mild atherosclerotic calcification at the bifurcation without hemodynamically significant stenosis of the right internal carotid artery. There is an undulating appearance of the distal right ICA. --External carotid artery: No acute abnormality. Left carotid system: --Common carotid artery: Widely patent origin without common carotid artery dissection or aneurysm. --Internal carotid artery:There is severe stenosis of the entire length of the left internal carotid artery from the bifurcation to the skull base, greatest in the proximal portion where there is narrowing of the lumen to less than 2 mm. --External carotid artery: No acute abnormality. Vertebral arteries: Left dominant configuration. Both origins are normal. The right vertebral artery is diminutive along its entire course, likely congenital. The left vertebral artery is normal. Skeleton: There is no bony spinal canal stenosis. No lytic or blastic lesion. Other neck: Normal pharynx, larynx and major salivary glands. No cervical lymphadenopathy. Unremarkable thyroid gland. Upper chest: No pneumothorax or pleural effusion. No nodules or masses. Review of the MIP images confirms the above findings IMPRESSION: 1. 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 The time frame of this stenosis remains uncertain. 2. 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 3. Diminutive right vertebral artery, likely congenital. Electronically Signed   By: Ulyses Jarred M.D.   On: 07/10/2017 18:59   I reviewed the patient's CTA Neck, there is a likely proximal internal carotid artery plaque that resulted in underfilling of remaining internal carotid artery.  This lesion appears to be a high lesion that would better managed with TCAR.  There appears to be >10 cm of distance from clavicle to the lesion, so should be technically possible.    MRA brain 07/09/17: IMPRESSION: MRI HEAD IMPRESSION:  1. Patchy multifocal acute to early subacute ischemic nonhemorrhagic left MCA territory infarcts. No significant mass effect. 2. Otherwise normal brain MRI for age.  MRA HEAD IMPRESSION:  1. Diminutive left ICA as compared to the right, with overall decreased and attenuated flow within the left MCA distribution. Finding of unknown chronicity, although further evaluation with vascular imaging of the neck is suggested to fully evaluate these findings. 2. Mild atherosclerotic change involving the intracranial circulation for age. No proximal high-grade or correctable stenosis identified.   Assessment/Plan:   Sandra Shelton is a 62 y.o. (12/14/1955) female who was admitted to the hospital on 07/05/17 with DKA and subsequently left brain stroke with right arm weakness   -right arm weakness has essentially returned to baseline.  She denies ever having any weakness prior to admission or any other stroke sx.   -carotid duplex revealed 1-39% stenosis bilaterally, however, MRA revealed diminished flow and caliper throughout the left carotid that suggested high grade proximal stenosis.  Dr. Bridgett Larsson will review the scans and see pt this afternoon.    Leontine Locket, PA-C [facility] [phone]  Addendum  I have independently interviewed and  examined the patient, and I agree with the physician assistant's findings.  The chronology  of events and radiologic studies are suggestive of underfilling of the left brain due to hypotension due to DKA/sepsis in the setting of chronic high grade L ICA stenosis.     Normally, I try not to intervene in the peri-stroke time period, but given this patient's mechanism, I suspect earlier intervention is necessary to avoid a recurrence.  I would continue to allow her BP to be elevated given the underflow mechanism of her stroke.  Dental abscess still needs to be addressed, preferably prior to placement of any stent to avoid contamination from bacteremia.  Oral surgery may need to be consulted.  Pt's uncontrolled Diabetes needs aggressive management.  It's unclear to me if this is new onset DM or an exacerbation caused by the dental abscess.  She will need to be referred to Endocrinology ASAP.  Dr. Oneida Alar will take over care of this patient tomorrow, as I am on leave for the next two weeks.  If he feels she is a good candidate for TCAR, he will refer her case to the [place] rep for measurements and order the appropriate parts for a L TCAR.    Adele Barthel, MD, FACS [facility] Office: [phone] Pager: [phone]  07/11/2017, 1:22 PM

## 2017-07-11 NOTE — Progress Notes (Signed)
  Speech Language Pathology Treatment: Dysphagia;Cognitive-Linquistic  Patient Details Name: Sandra Shelton MRN: [id number] DOB: [birth year] Today's Date: 07/11/2017 Time: 5102-5852 SLP Time Calculation (min) (ACUTE ONLY): 28 min  Assessment / Plan / Recommendation Clinical Impression  Skilled observation complete with patient self feeding currently prescribed diet without overt s/s of aspiration, independent use of safe swallowing precautions. Per patient, left sided facial droop and numbness due to nerve injury s/p recent dental abscess rather than CVA (unclear if correct based on chart review). Overall, patient tolerating diet and wishes to remain on soft solids due to recent dental work. Will f/u briefly to ensure that patient does not want to advance.   Cognitive diagnostic treatment complete as based on conversation, speech and cognitive skills appear to be improving as compared to initial evaluation. Patient intelligible with fluent speech that is without word finding deficits. Patient continues however to present with cognitive impairments in the areas of high level problem solving and executive function with ability to complete tasks with moderate clinician cueing. Patient will benefit from ongoing skilled treatment in these areas.    HPI HPI: Presents to ED on 6/21 after being found obtunded at home. Sister states that she had spoke to patient hours before on phone and she said that she didn't feel good and could not get out of bed. Family reports fatigue, weight loss, polydipsia, and recent dental abscess. EMS reports that patient was non-responsive and glucose read HIGH MRI head on 07/09/17 indicated Patchy multifocal acute to early subacute ischemic nonhemorrhagic left MCA territory infarcts.       SLP Plan  Continue with current plan of care       Recommendations  Diet recommendations: Dysphagia 3 (mechanical soft);Thin liquid Medication Administration: Whole meds with  puree Supervision: Patient able to self feed;Intermittent supervision to cue for compensatory strategies Compensations: Slow rate;Small sips/bites;Lingual sweep for clearance of pocketing Postural Changes and/or Swallow Maneuvers: Seated upright 90 degrees                Oral Care Recommendations: Oral care BID SLP Visit Diagnosis: Dysphagia, oropharyngeal phase (R13.12);Aphasia (R47.01);Cognitive communication deficit (R41.841) Plan: Continue with current plan of care       GO             [name] MA, [place] 07/11/2017, 10:29 AM

## 2017-07-11 NOTE — Consult Note (Addendum)
Hospital Consult    Reason for Consult:  Carotid stenosis Requesting Physician:  Erlinda Hong MRN #:  [id number]   History of Present Illness:   Sandra Shelton is a 62 y.o. (1955-11-17) female who was admitted on 07/05/17 after she was found obtunded at home.  She was brought to the hospital by EMS in DKA.  Her glucose in the ER was greater than 1200.  During her hospitalization, she was found to have right sided weakness.  She states she has never had this in the past but it was present earlier in this admission.  She doesn't feel like she has had much weakness in her right leg and feels her right hand is back at baseline.  She denies any clumsiness or numbness.  She denies ever having any speech difficulties or visual problems.    Neurology was consulted.   CT of head did show positive for subacute left frontal and left caudate infarcts.  She has not had a stroke prior to this.  A carotid duplex was obtained and revealed 1-39% stenosis bilaterally, but was technically difficult.  MRA of the brain suggested diminished flow and caliber throughout the left carotid suggesting high grade proximal stenosis.  [facility] is consulted to evaluate pt.    The pt is on a statin for cholesterol management.  She takes a daily aspirin.  She has been placed on Plavix during this admission.  Prior to admission, she was on oral agents for diabetes.  She is on clonidine and Dyazide for blood pressure management.   She denies hx of chest pain, MI, hx DVT or claudication sx.  She had surgery for a uterine fibroid in the mid 2000's.   She has never smoked.  She works to help homeless veterans find housing.  She is from [place] and has lived here for 5 years.  Her mom is deceased but had a hx of stroke.  Her father is living at age 81 and has hx of cancer.   Past Medical History:  Diagnosis Date  . Hypertension   DM, uncontrolled Dental abscess  Past Surgical History:  Procedure Laterality Date  . UTERINE FIBROID SURGERY        No Known Allergies  Prior to Admission medications   Medication Sig Start Date End Date Taking? Authorizing Provider  aspirin 81 MG tablet Take 81 mg by mouth daily.    [provider]  betamethasone dipropionate (DIPROLENE) 0.05 % cream Apply topically 2 (two) times daily. Patient not taking: Reported on 07/06/2017 08/16/14   Billy Fischer, MD  cloNIDine (CATAPRES) 0.2 MG tablet Take 0.2 mg by mouth 2 (two) times daily.    [provider]  meclizine (ANTIVERT) 25 MG tablet Take 1 tablet (25 mg total) by mouth 4 (four) times daily. Patient not taking: Reported on 07/06/2017 10/28/11   Lacretia Leigh, MD  metFORMIN (GLUCOPHAGE) 500 MG tablet Take 500 mg by mouth 2 (two) times daily with a meal.    [provider]  metoCLOPramide (REGLAN) 10 MG tablet Take 10 mg by mouth 4 (four) times daily.    [provider]  rosuvastatin (CRESTOR) 20 MG tablet Take 20 mg by mouth daily.    [provider]  triamterene-hydrochlorothiazide (DYAZIDE) 37.5-25 MG capsule Take 1 capsule by mouth daily.    [provider]   Statin:  Yes.   Beta Blocker:  No. Aspirin:  Yes.   ACEI:  No. ARB:  No. CCB use:  No Other antiplatelets/anticoagulants:  Yes.   Plavix (sq heparin DVT prophylaxis)  Social History   Socioeconomic History  . Marital status: Single    Spouse name: Not on file  . Number of children: Not on file  . Years of education: Not on file  . Highest education level: Not on file  Occupational History  . Not on file  Social Needs  . Financial resource strain: Not on file  . Food insecurity:    Worry: Not on file    Inability: Not on file  . Transportation needs:    Medical: Not on file    Non-medical: Not on file  Tobacco Use  . Smoking status: Never Smoker  . Smokeless tobacco: Never Used  Substance and Sexual Activity  . Alcohol use: No  . Drug use: No  . Sexual activity: Not on file  Lifestyle  . Physical activity:    Days  per week: Not on file    Minutes per session: Not on file  . Stress: Not on file  Relationships  . Social connections:    Talks on phone: Not on file    Gets together: Not on file    Attends religious service: Not on file    Active member of club or organization: Not on file    Attends meetings of clubs or organizations: Not on file    Relationship status: Not on file  . Intimate partner violence:    Fear of current or ex partner: Not on file    Emotionally abused: Not on file    Physically abused: Not on file    Forced sexual activity: Not on file  Other Topics Concern  . Not on file  Social History Narrative  . Not on file     Family History  Problem Relation Age of Onset  . Diabetes Mellitus II Sister   . Stroke Mother   . Cancer Father     ROS: [x]  Positive   [ ]  Negative   [ ]  All sytems reviewed and are negative  Cardiac: []  chest pain/pressure []  palpitations []  SOB lying flat []  DOE  Vascular: []  pain in legs while walking []  pain in legs at rest []  pain in legs at night []  non-healing ulcers []  hx of DVT []  swelling in legs  Pulmonary: []  productive cough []  asthma/wheezing []  home O2  Neurologic: [x]  weakness in [x]  right arm []  legs []  numbness in []  arms []  legs [x]  hx of CVA []  mini stroke [] difficulty speaking or slurred speech []  temporary loss of vision in one eye []  dizziness  Hematologic: []  hx of cancer []  bleeding problems []  problems with blood clotting easily  Endocrine:   [x]  diabetes []  thyroid disease  GI []  vomiting blood []  blood in stool  GU: []  CKD/renal failure []  HD--[]  M/W/F or []  T/T/S []  burning with urination []  blood in urine  Psychiatric: []  anxiety []  depression  Musculoskeletal: []  arthritis []  joint pain  Integumentary: []  rashes []  ulcers  Constitutional: []  fever []  chills   Physical Examination  Vitals:   07/11/17 0915 07/11/17 1011  BP: (!) 167/59 (!) 160/72  Pulse: 81 86    Resp: 20 18  Temp: 97.6 F (36.4 C) (!) 97.4 F (36.3 C)  SpO2: 100% 100%   Body mass index is 27.61 kg/m.  General:  WDWN in NAD sitting in bed eating lunch without difficulty Gait: Not observed HENT: WNL, normocephalic Pulmonary: normal non-labored breathing, without Rales, rhonchi,  wheezing Cardiac: regular,  without  Murmurs, rubs or gallops; with right carotid bruit Abdomen:  soft, NT/ND, no masses Skin: without rashes Vascular Exam/Pulses:  Right Left  Radial 2+ (normal) 2+ (normal)  Ulnar Unable to palpate  Unable to palpate   Femoral Not palpated Unable to palpate  Popliteal Unable to palpate  Unable to palpate   DP 2+ (normal) 2+ (normal)  PT Unable to palpate  Unable to palpate    Extremities: without ischemic changes, without Gangrene , without cellulitis; without open wounds;  Musculoskeletal: no muscle wasting or atrophy  Neurologic: A&O X 3;  No focal weakness or paresthesias are detected; speech is fluent/normal; strength in BLE equal; right hand grip essentially equal-possibly slightly less on the right but strong bilaterally. Psychiatric:  The pt has Normal affect. Lymph : No Cervical, Axillary, or Inguinal lymphadenopathy    Laboratory   CBC CBC Latest Ref Rng & Units 07/10/2017 07/09/2017 07/08/2017  WBC 4.0 - 10.5 K/uL 7.9 12.4(H) 18.5(H)  Hemoglobin 12.0 - 15.0 g/dL 12.5 12.2 13.8  Hematocrit 36.0 - 46.0 % 39.9 39.1 43.2  Platelets 150 - 400 K/uL 121(L) 102(L) 121(L)    BMP BMP Latest Ref Rng & Units 07/10/2017 07/09/2017 07/08/2017  Glucose 70 - 99 mg/dL 143(H) 124(H) -  BUN 8 - 23 mg/dL 13 15 -  Creatinine 0.44 - 1.00 mg/dL 1.01(H) 1.12(H) -  Sodium 135 - 145 mmol/L 147(H) 150(H) 156(H)  Potassium 3.5 - 5.1 mmol/L 3.3(L) 2.9(L) -  Chloride 98 - 111 mmol/L 110 117(H) -  CO2 22 - 32 mmol/L 25 26 -  Calcium 8.9 - 10.3 mg/dL 8.4(L) 8.1(L) -    Coagulation Lab Results  Component Value Date   INR 1.23 07/05/2017   No results found for:  PTT  Lipids    Component Value Date/Time   CHOL 180 07/09/2017 0646   TRIG 380 (H) 07/09/2017 0646   HDL 22 (L) 07/09/2017 0646   CHOLHDL 8.2 07/09/2017 0646   VLDL 76 (H) 07/09/2017 0646   LDLCALC 82 07/09/2017 0646   Hg A1c > 15.5  Non-invasive Vascular Imaging     Carotid duplex 07/10/17:  1-39% bilaterally  2D Echo 07/10/17: Study Conclusions  - Left ventricle: The cavity size was normal. There was moderate   septal hypertropy with otherwise mild concentric hypertrophy.   Systolic function was normal. The estimated ejection fraction was   in the range of 60% to 65%. There was dynamic obstruction at   rest, with a peak velocity of 167 cm/sec and a peak gradient of   11 mm Hg. Wall motion was normal; there were no regional wall   motion abnormalities. Doppler parameters are consistent with   abnormal left ventricular relaxation (grade 1 diastolic   dysfunction). - Aortic valve: Transvalvular velocity was within the normal range.   There was no stenosis. There was no regurgitation. - Mitral valve: Transvalvular velocity was within the normal range.   There was no evidence for stenosis. There was no regurgitation. - Right ventricle: The cavity size was normal. Wall thickness was   normal. Systolic function was normal. - Tricuspid valve: There was no regurgitation.   Radiology     Ct Angio Neck W Or Wo Contrast  Result Date: 07/10/2017 CLINICAL DATA:  Right facial asymmetry. EXAM: CT ANGIOGRAPHY NECK TECHNIQUE: Multidetector CT imaging of the neck was performed using the standard protocol during bolus administration of intravenous contrast. Multiplanar CT image reconstructions and MIPs were obtained to evaluate the vascular anatomy. Carotid stenosis  measurements (when applicable) are obtained utilizing NASCET criteria, using the distal internal carotid diameter as the denominator. CONTRAST:  53mL ISOVUE-370 IOPAMIDOL (ISOVUE-370) INJECTION 76% COMPARISON:  Head CT  07/08/2017 FINDINGS: Aortic arch: There is mild calcific atherosclerosis of the aortic arch. There is no aneurysm, dissection or hemodynamically significant stenosis of the visualized ascending aorta and aortic arch. Conventional 3 vessel aortic branching pattern. The visualized proximal subclavian arteries are widely patent. Right carotid system: --Common carotid artery: Approximately 50% narrowing of the midportion of the right common carotid artery. --Internal carotid artery: Mild atherosclerotic calcification at the bifurcation without hemodynamically significant stenosis of the right internal carotid artery. There is an undulating appearance of the distal right ICA. --External carotid artery: No acute abnormality. Left carotid system: --Common carotid artery: Widely patent origin without common carotid artery dissection or aneurysm. --Internal carotid artery:There is severe stenosis of the entire length of the left internal carotid artery from the bifurcation to the skull base, greatest in the proximal portion where there is narrowing of the lumen to less than 2 mm. --External carotid artery: No acute abnormality. Vertebral arteries: Left dominant configuration. Both origins are normal. The right vertebral artery is diminutive along its entire course, likely congenital. The left vertebral artery is normal. Skeleton: There is no bony spinal canal stenosis. No lytic or blastic lesion. Other neck: Normal pharynx, larynx and major salivary glands. No cervical lymphadenopathy. Unremarkable thyroid gland. Upper chest: No pneumothorax or pleural effusion. No nodules or masses. Review of the MIP images confirms the above findings IMPRESSION: 1. 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 The time frame of this stenosis remains uncertain. 2. 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 3. Diminutive right vertebral artery, likely congenital. Electronically Signed   By: Ulyses Jarred M.D.   On: 07/10/2017 18:59   I reviewed the patient's CTA Neck, there is a likely proximal internal carotid artery plaque that resulted in underfilling of remaining internal carotid artery.  This lesion appears to be a high lesion that would better managed with TCAR.  There appears to be >10 cm of distance from clavicle to the lesion, so should be technically possible.    MRA brain 07/09/17: IMPRESSION: MRI HEAD IMPRESSION:  1. Patchy multifocal acute to early subacute ischemic nonhemorrhagic left MCA territory infarcts. No significant mass effect. 2. Otherwise normal brain MRI for age.  MRA HEAD IMPRESSION:  1. Diminutive left ICA as compared to the right, with overall decreased and attenuated flow within the left MCA distribution. Finding of unknown chronicity, although further evaluation with vascular imaging of the neck is suggested to fully evaluate these findings. 2. Mild atherosclerotic change involving the intracranial circulation for age. No proximal high-grade or correctable stenosis identified.   Assessment/Plan:   Sandra Shelton is a 62 y.o. (01-Oct-1955) female who was admitted to the hospital on 07/05/17 with DKA and subsequently left brain stroke with right arm weakness   -right arm weakness has essentially returned to baseline.  She denies ever having any weakness prior to admission or any other stroke sx.   -carotid duplex revealed 1-39% stenosis bilaterally, however, MRA revealed diminished flow and caliper throughout the left carotid that suggested high grade proximal stenosis.  Dr. Bridgett Larsson will review the scans and see pt this afternoon.    Leontine Locket, PA-C [facility] [phone]  Addendum  I have independently interviewed and  examined the patient, and I agree with the physician assistant's findings.  The chronology  of events and radiologic studies are suggestive of underfilling of the left brain due to hypotension due to DKA/sepsis in the setting of chronic high grade L ICA stenosis.     Normally, I try not to intervene in the peri-stroke time period, but given this patient's mechanism, I suspect earlier intervention is necessary to avoid a recurrence.  I would continue to allow her BP to be elevated given the underflow mechanism of her stroke.  Dental abscess still needs to be addressed, preferably prior to placement of any stent to avoid contamination from bacteremia.  Oral surgery may need to be consulted.  Pt's uncontrolled Diabetes needs aggressive management.  It's unclear to me if this is new onset DM or an exacerbation caused by the dental abscess.  She will need to be referred to Endocrinology ASAP.  Dr. Oneida Alar will take over care of this patient tomorrow, as I am on leave for the next two weeks.  If he feels she is a good candidate for TCAR, he will refer her case to the [employer] rep for measurements and order the appropriate parts for a L TCAR.    Adele Barthel, MD, FACS [facility] Office: [phone] Pager: [phone]  07/11/2017, 1:22 PM

## 2017-07-11 NOTE — Progress Notes (Signed)
Inpatient Rehabilitation Admissions Coordinator  I met with patient at bedside. 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 Noted plans for Vascular surgery review. Pt plans to d/c home with her sister, Inez Catalina, who can provide 24/7 assist. I will follow along.   Danne Baxter, RN, MSN Rehab Admissions Coordinator [phone] 07/11/2017 1:20 PM

## 2017-07-11 NOTE — Progress Notes (Signed)
PROGRESS NOTE  Sandra Shelton BPZ:[id number] DOB: [birth year] DOA: 07/05/2017 PCP: Lin Landsman, MD  HPI/Recap of past 24 hours:  She walked with PT this am She denies pain, no confusion, she reports loose stool, denies ab pain , no n/v, no fever  Assessment/Plan: Active Problems:   DKA, type 2 ([place])   DKA (diabetic ketoacidoses) ([place])   Cerebral thrombosis with cerebral infarction  newly diagnosed uncontrolled diabetes mellitus with diabetes ketoacidosis -Admitted to ICU and managed with IV insulin, aggressive IV hydration.  A1c of 15.5 -dka resolved, she is transitioned to subQ insulin, she does not have insurance, will need to discuss with patient about choices  Severe sepsis with encephalopathy presented on admission with possible dental abscess, she is started on augmentin, No facial or dental abscess seen on maxillofacial CT.  Sepsis now resolved. Confusion resolved.    New acute left MCA stroke   on 6/24 (3 days after hospitalization)  she was noted to have facial droop and diminished right hand grip.  Head CT was ordered showing subacute left frontal and left caudate infarcts.  MRI brain showed patchy multifocal early subacute left MCA infarct.  MRA also concerning for high grade left carotid stenosis. 2D echo shows LVH with vigorous LV function of 24-23%, grade 1 diastolic dysfunction.  Neurology input appreciated CTA neck with left  ICA stenosis, Case discussed with vascular surgery Dr Bridgett Larsson who will see patient in consult On plavix/asa/statin  AKI on CKDII -cr peaked at 4.6 -cr today 1.01  Code Status: full  Family Communication: patient   Disposition Plan: not ready for discharge   Consultants:  Neurology  Cir  Vascular surgery  Procedures:  none  Antibiotics:  augmentin   Objective: BP (!) 160/72 (BP Location: Right Arm)   Pulse 86   Temp (!) 97.4 F (36.3 C) (Oral)   Resp 18   Ht 5\' 6"  (1.676 m)   Wt 77.6 kg (171 lb 1.1 oz)   SpO2 100%    BMI 27.61 kg/m   Intake/Output Summary (Last 24 hours) at 07/11/2017 1137 Last data filed at 07/11/2017 0845 Gross per 24 hour  Intake 900 ml  Output 0 ml  Net 900 ml   Filed Weights   07/07/17 0600 07/08/17 0400 07/10/17 2016  Weight: 78.9 kg (173 lb 15.1 oz) 80 kg (176 lb 5.9 oz) 77.6 kg (171 lb 1.1 oz)    Exam: Patient is examined daily including today on 07/11/2017, exams remain the same as of yesterday except that has changed    General:  NAD  Cardiovascular: RRR  Respiratory: CTABL  Abdomen: Soft/ND/NT, positive BS  Musculoskeletal: No Edema  Neuro: alert, oriented , mild right sided facial droop  Data Reviewed: Basic Metabolic Panel: Recent Labs  Lab 07/06/17 0201  07/07/17 0312 07/07/17 5361 07/07/17 1547 07/08/17 0251 07/08/17 1539 07/09/17 0646 07/10/17 0441  NA 159*   < >  --  159* 155* 161* 156* 150* 147*  K 2.6*   < >  --  3.6 4.2 3.1*  --  2.9* 3.3*  CL 124*   < >  --  >130* 124* 128*  --  117* 110  CO2 16*   < >  --  21* 19* 23  --  26 25  GLUCOSE 662*   < >  --  221* 355* 141*  --  124* 143*  BUN 90*   < >  --  60* 49* 36*  --  15 13  CREATININE 4.13*   < >  --  2.62* 2.25* 1.83*  --  1.12* 1.01*  CALCIUM 9.2   < >  --  8.8* 8.6* 8.9  --  8.1* 8.4*  MG 3.8*  --  2.9*  --   --   --   --  1.8  --   PHOS 1.2*  --   --   --   --   --   --  2.1* 1.8*   < > = values in this interval not displayed.   Liver Function Tests: Recent Labs  Lab 07/05/17 2242 07/09/17 0646 07/10/17 0441  AST 30  --   --   ALT 21  --   --   ALKPHOS 82  --   --   BILITOT 1.4*  --   --   PROT 8.1  --   --   ALBUMIN 4.0 2.2* 2.4*   No results for input(s): LIPASE, AMYLASE in the last 168 hours. Recent Labs  Lab 07/06/17 0236  AMMONIA 23   CBC: Recent Labs  Lab 07/05/17 2242  07/06/17 0107 07/07/17 0611 07/08/17 0251 07/09/17 0646 07/10/17 0441  WBC 18.2*  --   --  23.4* 18.5* 12.4* 7.9  NEUTROABS 16.3*  --   --   --   --   --   --   HGB 18.4*   < > 17.3*  13.2 13.8 12.2 12.5  HCT 64.5*   < > 51.0* 40.9 43.2 39.1 39.9  MCV 100.0  --   --  87.6 88.3 89.5 89.5  PLT 329  --   --  149* 121* 102* 121*   < > = values in this interval not displayed.   Cardiac Enzymes:   Recent Labs  Lab 07/06/17 0128 07/06/17 0643 07/06/17 1346 07/06/17 1514  TROPONINI 0.06* 0.10* 0.20* 0.39*   BNP (last 3 results) No results for input(s): BNP in the last 8760 hours.  ProBNP (last 3 results) No results for input(s): PROBNP in the last 8760 hours.  CBG: Recent Labs  Lab 07/10/17 0734 07/10/17 1136 07/10/17 1650 07/10/17 2015 07/11/17 0748  GLUCAP 152* 260* 156* 180* 118*    Recent Results (from the past 240 hour(s))  Blood culture (routine x 2)     Status: None   Collection Time: 07/05/17 10:30 PM  Result Value Ref Range Status   Specimen Description BLOOD RIGHT ANTECUBITAL  Final   Special Requests   Final    BOTTLES DRAWN AEROBIC AND ANAEROBIC Blood Culture results may not be optimal due to an inadequate volume of blood received in culture bottles   Culture   Final    NO GROWTH 5 DAYS Performed at [facility], [place] [address], [place], [place] [address]    Report Status 07/10/2017 FINAL  Final  Blood culture (routine x 2)     Status: None   Collection Time: 07/05/17 10:45 PM  Result Value Ref Range Status   Specimen Description BLOOD LEFT ARM  Final   Special Requests   Final    BOTTLES DRAWN AEROBIC AND ANAEROBIC Blood Culture adequate volume   Culture   Final    NO GROWTH 5 DAYS Performed at [facility], [place] [address] [place], [place] [address]    Report Status 07/10/2017 FINAL  Final  Urine culture     Status: None   Collection Time: 07/05/17 11:49 PM  Result Value Ref Range Status   Specimen Description URINE, CATHETERIZED  Final   Special Requests NONE  Final   Culture   Final    NO GROWTH Performed at [facility], [place] [address] [place], [place] [address]    Report Status 07/07/2017 FINAL  Final    MRSA PCR Screening     Status: None   Collection Time: 07/06/17  3:11 AM  Result Value Ref Range Status   MRSA by PCR NEGATIVE NEGATIVE Final    Comment:        The GeneXpert MRSA Assay (FDA approved for NASAL specimens only), is one component of a comprehensive MRSA colonization surveillance program. It is not intended to diagnose MRSA infection nor to guide or monitor treatment for MRSA infections. Performed at [facility], [place] [address], [place], [place] [address]      Studies: Ct Angio Neck W Or Wo Contrast  Result Date: 07/10/2017 CLINICAL DATA:  Right facial asymmetry. EXAM: CT ANGIOGRAPHY NECK TECHNIQUE: Multidetector CT imaging of the neck was performed using the standard protocol during bolus administration of intravenous contrast. Multiplanar CT image reconstructions and MIPs were obtained to evaluate the vascular anatomy. Carotid stenosis measurements (when applicable) are obtained utilizing NASCET criteria, using the distal internal carotid diameter as the denominator. CONTRAST:  71mL ISOVUE-370 IOPAMIDOL (ISOVUE-370) INJECTION 76% COMPARISON:  Head CT 07/08/2017 FINDINGS: Aortic arch: There is mild calcific atherosclerosis of the aortic arch. There is no aneurysm, dissection or hemodynamically significant stenosis of the visualized ascending aorta and aortic arch. Conventional 3 vessel aortic branching pattern. The visualized proximal subclavian arteries are widely patent. Right carotid system: --Common carotid artery: Approximately 50% narrowing of the midportion of the right common carotid artery. --Internal carotid artery: Mild atherosclerotic calcification at the bifurcation without hemodynamically significant stenosis of the right internal carotid artery. There is an undulating appearance of the distal right ICA. --External carotid artery: No acute abnormality. Left carotid system: --Common carotid artery: Widely patent origin without common carotid artery dissection  or aneurysm. --Internal carotid artery:There is severe stenosis of the entire length of the left internal carotid artery from the bifurcation to the skull base, greatest in the proximal portion where there is narrowing of the lumen to less than 2 mm. --External carotid artery: No acute abnormality. Vertebral arteries: Left dominant configuration. Both origins are normal. The right vertebral artery is diminutive along its entire course, likely congenital. The left vertebral artery is normal. Skeleton: There is no bony spinal canal stenosis. No lytic or blastic lesion. Other neck: Normal pharynx, larynx and major salivary glands. No cervical lymphadenopathy. Unremarkable thyroid gland. Upper chest: No pneumothorax or pleural effusion. No nodules or masses. Review of the MIP images confirms the above findings IMPRESSION: 1. 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 The time frame of this stenosis remains uncertain. 2. 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 3. Diminutive right vertebral artery, likely congenital. Electronically Signed   By: Ulyses Jarred M.D.   On: 07/10/2017 18:59    Scheduled Meds: . amLODipine  5 mg Oral Daily  . amoxicillin-clavulanate  1 tablet Oral Q12H  . aspirin  81 mg Oral Daily  . clopidogrel  75 mg Oral Daily  . heparin injection (subcutaneous)  5,000 Units Subcutaneous Q8H  . insulin aspart  0-15 Units Subcutaneous TID WC  . insulin aspart  0-5 Units Subcutaneous QHS  . insulin glargine  10 Units Subcutaneous Daily  . potassium & sodium phosphates  1 packet Oral TID WC & HS  . rosuvastatin  40 mg Oral q1800  . saccharomyces boulardii  250 mg Oral BID    Continuous Infusions:   Time spent: 35 mins, case discussed with vascular surgery I have personally reviewed and interpreted on  07/11/2017 daily labs, tele  strips, imagings as discussed above under date review session and assessment and plans.  I reviewed all nursing notes, pharmacy notes, consultant notes,  vitals, pertinent old records  I have discussed plan of care as described above with RN , patient  on 07/11/2017   Florencia Reasons MD, PhD  [facility] Pager [phone]. If 7PM-7AM, please contact night-coverage at [email or web address], password [facility] 07/11/2017, 11:37 AM  LOS: 5 days

## 2017-07-11 NOTE — Progress Notes (Signed)
Physical Therapy Treatment Patient Details Name: Sandra Shelton MRN: [id number] DOB: [birth year] Today's Date: 07/11/2017    History of Present Illness Presents to ED on 6/21 after being found obtunded at home. Upon arrival to ED BP 76/50. Afebrile, Tachycardiac. Glucose 1265. 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 DKA. No previous hx of DM.    PT Comments    Patient is progressing towards their physical therapy goals. Ambulating 200 feet without an assistive device with up to minimal assistance for balance. Session focused on dual tasking during gait including head turns and cognitive tasks including naming animals that start with an "A." Patient continues to present with cognitive impairments, requiring increased time and max cueing for task as well as directional cues for finding room. She also persists with dynamic balance deficits with environmental distractions and perturbations. Her current gait speed of 1.06 ft/s indicates she is at high risk for falls. Updating discharge plan to outpatient neuro PT.    Follow Up Recommendations  Outpatient PT (however, note that due to pt insurance, may only be applicable for [facility] services);Supervision for mobility/OOB     Equipment Recommendations  None recommended by PT    Recommendations for Other Services Rehab consult     Precautions / Restrictions Precautions Precautions: Fall Restrictions Weight Bearing Restrictions: No    Mobility  Bed Mobility Overal bed mobility: Modified Independent Bed Mobility: Supine to Sit     Supine to sit: Modified independent (Device/Increase time) Sit to supine: Supervision      Transfers Overall transfer level: Independent Equipment used: None Transfers: Sit to/from Stand Sit to Stand: Independent         General transfer comment: from EOB and 3n1 over toilet. no AD and no  ohysical assist  Ambulation/Gait Ambulation/Gait assistance: Min guard;Min assist Gait Distance (Feet): 200 Feet Assistive device: None Gait Pattern/deviations: Step-through pattern;Decreased stride length;Narrow base of support;Drifts right/left Gait velocity: 1.06 ft/s Gait velocity interpretation: <1.31 ft/sec, indicative of household ambulator General Gait Details: Patient with narrow BOS, slow speed, and decreased reciprocal arm swing. During dual tasking, tending to either stop or lose balance to right/left requiring min assist to correct.   Stairs             Wheelchair Mobility    Modified Rankin (Stroke Patients Only) Modified Rankin (Stroke Patients Only) Pre-Morbid Rankin Score: No symptoms Modified Rankin: Moderate disability     Balance Overall balance assessment: Needs assistance Sitting-balance support: Feet supported Sitting balance-Leahy Scale: Good     Standing balance support: During functional activity Standing balance-Leahy Scale: Fair               High level balance activites: Head turns              Cognition Arousal/Alertness: Awake/alert Behavior During Therapy: WFL for tasks assessed/performed Overall Cognitive Status: Impaired/Different from baseline Area of Impairment: Problem solving                   Current Attention Level: Selective Memory: Decreased recall of precautions;Decreased short-term memory(intact immediate recall; poor delayed recall) Following Commands: Follows multi-step commands with increased time Safety/Judgement: Decreased awareness of safety;Decreased awareness of deficits Awareness: Emergent Problem Solving: Slow processing;Difficulty sequencing;Requires verbal cues;Requires tactile cues General Comments: (unable to recall room number after 1 min delay; difficulty f)      Exercises      General Comments  Pertinent Vitals/Pain Pain Assessment: No/denies pain    Home Living                       Prior Function            PT Goals (current goals can now be found in the care plan section) Acute Rehab PT Goals Patient Stated Goal: to get better PT Goal Formulation: With patient/family Time For Goal Achievement: 08/22/17 Potential to Achieve Goals: Good Progress towards PT goals: Progressing toward goals    Frequency    Min 4X/week      PT Plan Discharge plan needs to be updated    Co-evaluation              AM-PAC PT "6 Clicks" Daily Activity  Outcome Measure  Difficulty turning over in bed (including adjusting bedclothes, sheets and blankets)?: None Difficulty moving from lying on back to sitting on the side of the bed? : None Difficulty sitting down on and standing up from a chair with arms (e.g., wheelchair, bedside commode, etc,.)?: A Little Help needed moving to and from a bed to chair (including a wheelchair)?: A Little Help needed walking in hospital room?: A Little Help needed climbing 3-5 steps with a railing? : A Little 6 Click Score: 20    End of Session Equipment Utilized During Treatment: Gait belt Activity Tolerance: Patient tolerated treatment well Patient left: in bed;with call bell/phone within reach Nurse Communication: Mobility status PT Visit Diagnosis: Unsteadiness on feet (R26.81);Hemiplegia and hemiparesis Hemiplegia - Right/Left: Right Hemiplegia - caused by: Cerebral infarction     Time: 1601-0932 PT Time Calculation (min) (ACUTE ONLY): 11 min  Charges:  $Therapeutic Activity: 8-22 mins                    G Codes:       [name], PT, DPT Acute Rehabilitation Services  Pager: [phone]    [name] 07/11/2017, 2:57 PM

## 2017-07-11 NOTE — Progress Notes (Signed)
STROKE TEAM PROGRESS NOTE       INTERVAL HISTORY No family is at the bedside.  She states he has improved nearly to her baseline. Echocardiogram showed normal ejection fraction without cardiac source of embolism. Carotid ultrasound shows no significant extracranial stenosis but is suboptimal and suggest damp to left ICA waveforms and hence CT angiogram of the neck was obtained which shows severe stenosis of proximal left ICA with diminished caliber of left ICA throughout.    Vitals:   07/10/17 2016 07/11/17 0526 07/11/17 0915 07/11/17 1011  BP: (!) 159/70 (!) 163/66 (!) 167/59 (!) 160/72  Pulse: 76 72 81 86  Resp: 19 17 20 18   Temp: 98.9 F (37.2 C) 98.1 F (36.7 C) 97.6 F (36.4 C) (!) 97.4 F (36.3 C)  TempSrc:  Oral Oral Oral  SpO2: 100% 99% 100% 100%  Weight: 171 lb 1.1 oz (77.6 kg)     Height:        CBC:  Recent Labs  Lab 07/05/17 2242  07/09/17 0646 07/10/17 0441  WBC 18.2*   < > 12.4* 7.9  NEUTROABS 16.3*  --   --   --   HGB 18.4*   < > 12.2 12.5  HCT 64.5*   < > 39.1 39.9  MCV 100.0   < > 89.5 89.5  PLT 329   < > 102* 121*   < > = values in this interval not displayed.    Basic Metabolic Panel:  Recent Labs  Lab 07/07/17 0312  07/09/17 0646 07/10/17 0441  NA  --    < > 150* 147*  K  --    < > 2.9* 3.3*  CL  --    < > 117* 110  CO2  --    < > 26 25  GLUCOSE  --    < > 124* 143*  BUN  --    < > 15 13  CREATININE  --    < > 1.12* 1.01*  CALCIUM  --    < > 8.1* 8.4*  MG 2.9*  --  1.8  --   PHOS  --   --  2.1* 1.8*   < > = values in this interval not displayed.   Lipid Panel:     Component Value Date/Time   CHOL 180 07/09/2017 0646   TRIG 380 (H) 07/09/2017 0646   HDL 22 (L) 07/09/2017 0646   CHOLHDL 8.2 07/09/2017 0646   VLDL 76 (H) 07/09/2017 0646   LDLCALC 82 07/09/2017 0646   HgbA1c:  Lab Results  Component Value Date   HGBA1C >15.5 (H) 07/06/2017   Urine Drug Screen:     Component Value Date/Time   LABOPIA NONE DETECTED 07/05/2017  2349   COCAINSCRNUR NONE DETECTED 07/05/2017 2349   LABBENZ NONE DETECTED 07/05/2017 2349   AMPHETMU NONE DETECTED 07/05/2017 2349   THCU NONE DETECTED 07/05/2017 2349   LABBARB (A) 07/05/2017 2349    Result not available. Reagent lot number recalled by manufacturer.    Alcohol Level     Component Value Date/Time   ETH <10 07/05/2017 2242    IMAGING Ct Angio Neck W Or Wo Contrast  Result Date: 07/10/2017 CLINICAL DATA:  Right facial asymmetry. EXAM: CT ANGIOGRAPHY NECK TECHNIQUE: Multidetector CT imaging of the neck was performed using the standard protocol during bolus administration of intravenous contrast. Multiplanar CT image reconstructions and MIPs were obtained to evaluate the vascular anatomy. Carotid stenosis measurements (when applicable) are obtained utilizing NASCET  criteria, using the distal internal carotid diameter as the denominator. CONTRAST:  41mL ISOVUE-370 IOPAMIDOL (ISOVUE-370) INJECTION 76% COMPARISON:  Head CT 07/08/2017 FINDINGS: Aortic arch: There is mild calcific atherosclerosis of the aortic arch. There is no aneurysm, dissection or hemodynamically significant stenosis of the visualized ascending aorta and aortic arch. Conventional 3 vessel aortic branching pattern. The visualized proximal subclavian arteries are widely patent. Right carotid system: --Common carotid artery: Approximately 50% narrowing of the midportion of the right common carotid artery. --Internal carotid artery: Mild atherosclerotic calcification at the bifurcation without hemodynamically significant stenosis of the right internal carotid artery. There is an undulating appearance of the distal right ICA. --External carotid artery: No acute abnormality. Left carotid system: --Common carotid artery: Widely patent origin without common carotid artery dissection or aneurysm. --Internal carotid artery:There is severe stenosis of the entire length of the left internal carotid artery from the bifurcation to the  skull base, greatest in the proximal portion where there is narrowing of the lumen to less than 2 mm. --External carotid artery: No acute abnormality. Vertebral arteries: Left dominant configuration. Both origins are normal. The right vertebral artery is diminutive along its entire course, likely congenital. The left vertebral artery is normal. Skeleton: There is no bony spinal canal stenosis. No lytic or blastic lesion. Other neck: Normal pharynx, larynx and major salivary glands. No cervical lymphadenopathy. Unremarkable thyroid gland. Upper chest: No pneumothorax or pleural effusion. No nodules or masses. Review of the MIP images confirms the above findings IMPRESSION: 1. 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 The time frame of this stenosis remains uncertain. 2. 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 3. Diminutive right vertebral artery, likely congenital. Electronically Signed   By: Ulyses Jarred M.D.   On: 07/10/2017 18:59    PHYSICAL EXAM  Obese middle-aged African-American lady currently not in distress. . Afebrile. Head is nontraumatic. Neck is supple without bruit.    Cardiac exam no murmur or gallop. Lungs are clear to auscultation. Distal pulses are well felt. Neurological Exam :  Awake and alert oriented to time place and person. Diminished attention registration and recall. Speech is fluent without aphasia or dysarthria. Extraocular moments are full range without nystagmus. Blinks to threat bilaterally. Fundi were not visualized. Mild right lower facial asymmetry when she smiles. Tongue midline. Motor system exam no upper or lower eczema to drift but mild weakness of the right grip and intrinsic hand muscles. Orbits left over right upper extremity. Deep tendon reflexes are symmetric. Ankles are depressed. Plantars are downgoing.  Sensation is intact. Gait not tested. ASSESSMENT/PLAN [name] is a 61 y.o. female with history of hypertension and obesity found down by her sister.  Admitted to the hospital for DKA.  CT done when RN noted right greater than left grip showed subacute left frontal and left caudate infarcts.   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  Concern for carotid stenosis.  CT head acute to subacute left frontal and caudate infarcts.Small vessel disease. Atrophy.   MRI patchy multifocal early subacute left MCA infarcts  MRA small left ICA compared to right with decreased flow in left MCA distribution, etiology unknown.  Mild atherosclerotic changes.  Carotid Doppler  No significant extracranial stenosis   2D Echo  pending   LDL 82  HgbA1c >15.5  Heparin 5000 units sq tid for VTE prophylaxis  aspirin 81 mg daily prior to admission, now on No antithrombotic. Given  mild stroke, will place on aspirin 81 mg and plavix 75 mg daily x 3 weeks, then plavix alone. Orders adjusted. May readjust pending carotid findings.  Therapy recommendations:  CIR. Consult placed.  Disposition:  pending   Hx Hypertension Hypotension in setting of hypovolemia, resolved  Stable . Permissive hypertension (OK if < 220/120) but gradually normalize in 5-7 days . Long-term BP goal normotensive  Hyperlipidemia  Home meds: Crestor 20  Crestor increased to 40 mg in hospital  LDL 82, goal < 70  Continue statin at discharge  Diabetes type II, new diagnosis  Admitted for DKA, glucose is greater than 800   HgbA1c greater than 15.5, goal < 7.0  Uncontrolled  Other Stroke Risk Factors  Other Active Problems  Respiratory insufficiency in setting of DKA  Severe anion gap metabolic acidosis, hyperchloremic secondary to DKA  Hospital day # 5 I have personally examined this patient, reviewed notes, independently viewed imaging studies, participated in  medical decision making and plan of care.ROS completed by me personally and pertinent positives fully documented  I have made any additions or clarifications directly to the above note.   She has presented with embolic left MCA branch infarcts in setting of diabetic ketoacidosis. CT angiogram of the neck shows high-grade proximal left ICA stenosis which is long segment and extends intracranially. This is likely a previous dissection given changes of fibromuscular dysplasia on the opposite side.Continue  Dual antiplatelet therapy and aggressive risk factor modification. Discussed with patient and Dr. Erlinda Hong. Vascular surgery consult for consideration for left carotid revascularization. . Greater than 50% time during this 25 minute visitt was spent in counseling and cognition of care about her embolic stroke and answered questions.  Antony Contras, MD Medical Director [facility] Pager: [phone] 07/11/2017 1:29 PM     To contact Stroke Continuity provider, please refer to [email or web address] After hours, contact General Neurology

## 2017-07-11 NOTE — Progress Notes (Signed)
Inpatient Rehabilitation Admissions Coordinator  Patient has progressed with therapy to level of outpatient or [place] level when medical work up is complete. She plans to d/c to her sister's home, [place] [facility]. We will sign off at this time.  Danne Baxter, RN, MSN Rehab Admissions Coordinator [phone] 07/11/2017 4:14 PM

## 2017-07-11 NOTE — Care Management Note (Addendum)
Case Management Note  Patient Details  Name: Sandra Shelton MRN: [id number] Date of Birth: 19-Nov-1955  Subjective/Objective:                 Spoke to patient at bedside. She states that she does not have insurance. Reviewed PT note that shows patient requiring minimal assist in room. Patient verbalized that she got around room well, does not currently nor plan to need DME.  From home alone but she plans to stay with her sister at [place]. Anticipate HH through [place] program, referral placed so they can start financial review. Patient has been approved for [facility] services through [facility]. Will need orders at [place].  She is newly diagnosed DM. Noted that she is on Lantus, would rec switching to a 70/30 regimen as this will be most affordable option for uninsured patient.  Patient follows with [name] MD for PCP, is not eligible to use [facility] pharmacy, will need to utilize low cost DM supplies and insulins at [facility]. Patient states she has been going to her PCP for years. Patient aware of reli on meter and supplies there. Patient states she takes the bus or sister helps provide transportation to get MD/ Pharmacy.    Action/Plan:   Expected Discharge Date:                  Expected Discharge Plan:  [place]  In-House Referral:  Clinical Social Work  Discharge planning Services  CM Consult  Post Acute Care Choice:    Choice offered to:     DME Arranged:    DME Agency:     HH Arranged:    [place] Agency:     Status of Service:  In process, will continue to follow  If discussed at Long Length of Stay Meetings, dates discussed:    Additional Comments:  Carles Collet, RN 07/11/2017, 12:15 PM

## 2017-07-11 NOTE — Progress Notes (Addendum)
Occupational Therapy Treatment Patient Details Name: Sandra Shelton MRN: [id number] DOB: 10-06-1955 Today's Date: 07/11/2017    History of present illness Presents to ED on 6/21 after being found obtunded at home. Upon arrival to ED BP 76/50. Afebrile, Tachycardiac. Glucose 1265. 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 DKA. No previous hx of DM.   OT comments  Pt progressing well towards acute OT goals. Toilet transfer, functional mobility, and grooming in standing position all at min guard level. Pt tolerated session well. Cognitive deficits still noted. Updated d/c plan to home with OP therapy at neuro clinic.    Follow Up Recommendations  OP therapy at neuro clinic    Equipment Recommendations  3 in 1 bedside commode , 24 hour assist/supervision   Recommendations for Other Services      Precautions / Restrictions Precautions Precautions: Fall Restrictions Weight Bearing Restrictions: No       Mobility Bed Mobility Overal bed mobility: Needs Assistance Bed Mobility: Supine to Sit;Sit to Supine     Supine to sit: Supervision Sit to supine: Supervision      Transfers Overall transfer level: Needs assistance Equipment used: 1 person hand held assist Transfers: Sit to/from Stand Sit to Stand: Min guard         General transfer comment: from EOB and 3n1 over toilet. no AD and no ohysical assist    Balance Overall balance assessment: Needs assistance Sitting-balance support: Feet supported Sitting balance-Leahy Scale: Good     Standing balance support: During functional activity Standing balance-Leahy Scale: Fair                             ADL either performed or assessed with clinical judgement   ADL Overall ADL's : Needs assistance/impaired     Grooming: Min guard;Wash/dry hands;Standing                   Hydrographic surveyor: Min guard;Ambulation   Toileting- Clothing Manipulation and Hygiene: Set up;Min guard;Sitting/lateral lean;Sit to/from stand       Functional mobility during ADLs: Min guard General ADL Comments: Pt completed functinal mobility in the room, toilet transfer and pericare as well as grooming task in standing position     Vision       Perception     Praxis      Cognition Arousal/Alertness: Awake/alert Behavior During Therapy: WFL for tasks assessed/performed Overall Cognitive Status: Impaired/Different from baseline Area of Impairment: Problem solving                   Current Attention Level: Selective Memory: Decreased recall of precautions;Decreased short-term memory(intact immediate recall; poor delayed recall) Following Commands: Follows multi-step commands with increased time Safety/Judgement: Decreased awareness of safety;Decreased awareness of deficits Awareness: Emergent Problem Solving: Slow processing General Comments: (unable to recall room number after 1 min delay; difficulty f)        Exercises     Shoulder Instructions       General Comments      Pertinent Vitals/ Pain       Pain Assessment: No/denies pain  Home Living                                          Prior  Functioning/Environment              Frequency  Min 2X/week        Progress Toward Goals  OT Goals(current goals can now be found in the care plan section)  Progress towards OT goals: Progressing toward goals  Acute Rehab OT Goals Patient Stated Goal: to get better OT Goal Formulation: With patient Time For Goal Achievement: 07/23/17 Potential to Achieve Goals: Good ADL Goals Pt Will Perform Eating: with modified independence;sitting Pt Will Perform Grooming: with modified independence;standing Pt Will Perform Upper Body Bathing: with modified independence;sitting Pt Will Perform Lower Body Bathing: with modified independence;sit  to/from stand Pt Will Perform Lower Body Dressing: with modified independence;sit to/from stand Pt Will Transfer to Toilet: with modified independence;ambulating Additional ADL Goal #1: Pt will demonstrate anticipatory awareness during ADL task in miminally distracting environment  Plan Discharge plan needs to be updated    Co-evaluation                 AM-PAC PT "6 Clicks" Daily Activity     Outcome Measure   Help from another person eating meals?: None Help from another person taking care of personal grooming?: None Help from another person toileting, which includes using toliet, bedpan, or urinal?: A Little Help from another person bathing (including washing, rinsing, drying)?: A Little Help from another person to put on and taking off regular upper body clothing?: A Little Help from another person to put on and taking off regular lower body clothing?: A Little 6 Click Score: 20    End of Session    OT Visit Diagnosis: Other abnormalities of gait and mobility (R26.89);Muscle weakness (generalized) (M62.81);Other symptoms and signs involving cognitive function   Activity Tolerance Patient tolerated treatment well   Patient Left in bed;with call bell/phone within reach;with bed alarm set   Nurse Communication          Time: 6389-3734 OT Time Calculation (min): 11 min  Charges: OT General Charges $OT Visit: 1 Visit OT Treatments $Self Care/Home Management : 8-22 mins    Hortencia Pilar 07/11/2017, 2:24 PM

## 2017-07-11 NOTE — Progress Notes (Signed)
Inpatient Diabetes Program Recommendations  AACE/ADA: New Consensus Statement on Inpatient Glycemic Control (2015)  Target Ranges:  Prepandial:   less than 140 mg/dL      Peak postprandial:   less than 180 mg/dL (1-2 hours)      Critically ill patients:  140 - 180 mg/dL   Lab Results  Component Value Date   GLUCAP 248 (H) 07/11/2017   HGBA1C >15.5 (H) 07/06/2017    Review of Glycemic Control Results for AILEA, RHATIGAN (MRN [id number]) as of 07/11/2017 12:58  Ref. Range 07/10/2017 16:50 07/10/2017 20:15 07/11/2017 07:48 07/11/2017 11:36  Glucose-Capillary Latest Ref Range: 70 - 99 mg/dL 156 (H) 180 (H) 118 (H) 248 (H)   Checked back in with patient, who was self injecting scheduled insulin with RN. RN denies problems with safety and feels that learning has been appropriate. Encouraged to continue performing injections and begin obtaining CBG's independently. Patient denies further questions at this time.  At discharge patient prefers insulin pen. Would recommend Novolin 70/30 10 units BID when discharged to rehab. Patient and family has been provided information.    Thanks, Bronson Curb, MSN, RNC-OB Diabetes Coordinator [phone] (8a-5p)

## 2017-07-11 NOTE — Social Work (Signed)
CSW aware that CIR is no longer following, however pt plans to discharge to her sister's home when medically appropriate. Pt positively progressing with therapies.   CSW signing off. Please consult if any additional needs arise.  Alexander Mt, [place] Work [phone]

## 2017-07-12 ENCOUNTER — Inpatient Hospital Stay (HOSPITAL_COMMUNITY): Payer: Medicaid Other

## 2017-07-12 ENCOUNTER — Encounter (HOSPITAL_COMMUNITY): Payer: Self-pay | Admitting: Dentistry

## 2017-07-12 DIAGNOSIS — I639 Cerebral infarction, unspecified: Secondary | ICD-10-CM

## 2017-07-12 HISTORY — DX: Cerebral infarction, unspecified: I63.9

## 2017-07-12 LAB — BASIC METABOLIC PANEL
Anion gap: 10 (ref 5–15)
BUN: 5 mg/dL — ABNORMAL LOW (ref 8–23)
CHLORIDE: 107 mmol/L (ref 98–111)
CO2: 24 mmol/L (ref 22–32)
CREATININE: 0.74 mg/dL (ref 0.44–1.00)
Calcium: 9.1 mg/dL (ref 8.9–10.3)
Glucose, Bld: 263 mg/dL — ABNORMAL HIGH (ref 70–99)
POTASSIUM: 3.1 mmol/L — AB (ref 3.5–5.1)
SODIUM: 141 mmol/L (ref 135–145)

## 2017-07-12 LAB — GLUCOSE, CAPILLARY
GLUCOSE-CAPILLARY: 180 mg/dL — AB (ref 70–99)
GLUCOSE-CAPILLARY: 189 mg/dL — AB (ref 70–99)
GLUCOSE-CAPILLARY: 195 mg/dL — AB (ref 70–99)
Glucose-Capillary: 280 mg/dL — ABNORMAL HIGH (ref 70–99)

## 2017-07-12 LAB — MAGNESIUM: MAGNESIUM: 1.6 mg/dL — AB (ref 1.7–2.4)

## 2017-07-12 LAB — PHOSPHORUS: PHOSPHORUS: 2.9 mg/dL (ref 2.5–4.6)

## 2017-07-12 MED ORDER — POTASSIUM CHLORIDE CRYS ER 20 MEQ PO TBCR
40.0000 meq | EXTENDED_RELEASE_TABLET | Freq: Once | ORAL | Status: AC
  Administered 2017-07-12: 40 meq via ORAL
  Filled 2017-07-12: qty 2

## 2017-07-12 MED ORDER — MAGNESIUM SULFATE 2 GM/50ML IV SOLN
2.0000 g | Freq: Once | INTRAVENOUS | Status: AC
  Administered 2017-07-12: 2 g via INTRAVENOUS
  Filled 2017-07-12: qty 50

## 2017-07-12 MED ORDER — INSULIN ASPART PROT & ASPART (70-30 MIX) 100 UNIT/ML ~~LOC~~ SUSP
8.0000 [IU] | Freq: Two times a day (BID) | SUBCUTANEOUS | Status: DC
  Administered 2017-07-12 – 2017-07-13 (×2): 8 [IU] via SUBCUTANEOUS
  Filled 2017-07-12: qty 10

## 2017-07-12 MED ORDER — ACETAMINOPHEN 325 MG PO TABS: 650.0000 mg | ORAL_TABLET | Freq: Four times a day (QID) | ORAL | Status: DC | PRN

## 2017-07-12 NOTE — Progress Notes (Signed)
STROKE TEAM PROGRESS NOTE       INTERVAL HISTORY Sister is at the bedside.  She states he has improved to her baseline. Echocardiogram showed normal ejection fraction without cardiac source of embolism. Carotid ultrasound shows no significant extracranial stenosis but is suboptimal and suggest damp to left ICA waveforms and hence CT angiogram of the neck was obtained which shows severe stenosis of proximal left ICA with diminished caliber of left ICA throughout.  Will keep her on DUAP and suggest vascular consult for revascularization.  Vitals:   07/11/17 1011 07/11/17 1640 07/11/17 2106 07/12/17 0442  BP: (!) 160/72 (!) 155/69 (!) 169/66 (!) 166/69  Pulse: 86 73 77 80  Resp: 18   20  Temp: (!) 97.4 F (36.3 C) 97.9 F (36.6 C) 98.4 F (36.9 C) 98.1 F (36.7 C)  TempSrc: Oral Oral Oral   SpO2: 100% 100% 99% 96%  Weight:   171 lb 1.2 oz (77.6 kg)   Height:        CBC:  Recent Labs  Lab 07/05/17 2242  07/09/17 0646 07/10/17 0441  WBC 18.2*   < > 12.4* 7.9  NEUTROABS 16.3*  --   --   --   HGB 18.4*   < > 12.2 12.5  HCT 64.5*   < > 39.1 39.9  MCV 100.0   < > 89.5 89.5  PLT 329   < > 102* 121*   < > = values in this interval not displayed.    Basic Metabolic Panel:  Recent Labs  Lab 07/07/17 0312  07/09/17 0646 07/10/17 0441  NA  --    < > 150* 147*  K  --    < > 2.9* 3.3*  CL  --    < > 117* 110  CO2  --    < > 26 25  GLUCOSE  --    < > 124* 143*  BUN  --    < > 15 13  CREATININE  --    < > 1.12* 1.01*  CALCIUM  --    < > 8.1* 8.4*  MG 2.9*  --  1.8  --   PHOS  --   --  2.1* 1.8*   < > = values in this interval not displayed.   Lipid Panel:     Component Value Date/Time   CHOL 180 07/09/2017 0646   TRIG 380 (H) 07/09/2017 0646   HDL 22 (L) 07/09/2017 0646   CHOLHDL 8.2 07/09/2017 0646   VLDL 76 (H) 07/09/2017 0646   LDLCALC 82 07/09/2017 0646   HgbA1c:  Lab Results  Component Value Date   HGBA1C >15.5 (H) 07/06/2017   Urine Drug Screen:      Component Value Date/Time   LABOPIA NONE DETECTED 07/05/2017 2349   COCAINSCRNUR NONE DETECTED 07/05/2017 2349   LABBENZ NONE DETECTED 07/05/2017 2349   AMPHETMU NONE DETECTED 07/05/2017 2349   THCU NONE DETECTED 07/05/2017 2349   LABBARB (A) 07/05/2017 2349    Result not available. Reagent lot number recalled by manufacturer.    Alcohol Level     Component Value Date/Time   ETH <10 07/05/2017 2242    IMAGING Ct Angio Neck W Or Wo Contrast  Result Date: 07/10/2017 CLINICAL DATA:  Right facial asymmetry. EXAM: CT ANGIOGRAPHY NECK TECHNIQUE: Multidetector CT imaging of the neck was performed using the standard protocol during bolus administration of intravenous contrast. Multiplanar CT image reconstructions and MIPs were obtained to evaluate the vascular anatomy. Carotid  stenosis measurements (when applicable) are obtained utilizing NASCET criteria, using the distal internal carotid diameter as the denominator. CONTRAST:  37mL ISOVUE-370 IOPAMIDOL (ISOVUE-370) INJECTION 76% COMPARISON:  Head CT 07/08/2017 FINDINGS: Aortic arch: There is mild calcific atherosclerosis of the aortic arch. There is no aneurysm, dissection or hemodynamically significant stenosis of the visualized ascending aorta and aortic arch. Conventional 3 vessel aortic branching pattern. The visualized proximal subclavian arteries are widely patent. Right carotid system: --Common carotid artery: Approximately 50% narrowing of the midportion of the right common carotid artery. --Internal carotid artery: Mild atherosclerotic calcification at the bifurcation without hemodynamically significant stenosis of the right internal carotid artery. There is an undulating appearance of the distal right ICA. --External carotid artery: No acute abnormality. Left carotid system: --Common carotid artery: Widely patent origin without common carotid artery dissection or aneurysm. --Internal carotid artery:There is severe stenosis of the entire length  of the left internal carotid artery from the bifurcation to the skull base, greatest in the proximal portion where there is narrowing of the lumen to less than 2 mm. --External carotid artery: No acute abnormality. Vertebral arteries: Left dominant configuration. Both origins are normal. The right vertebral artery is diminutive along its entire course, likely congenital. The left vertebral artery is normal. Skeleton: There is no bony spinal canal stenosis. No lytic or blastic lesion. Other neck: Normal pharynx, larynx and major salivary glands. No cervical lymphadenopathy. Unremarkable thyroid gland. Upper chest: No pneumothorax or pleural effusion. No nodules or masses. Review of the MIP images confirms the above findings IMPRESSION: 1. 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 The time frame of this stenosis remains uncertain. 2. 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 3. Diminutive right vertebral artery, likely congenital. Electronically Signed   By: Ulyses Jarred M.D.   On: 07/10/2017 18:59    PHYSICAL EXAM  Obese middle-aged African-American lady currently not in distress. . Afebrile. Head is nontraumatic. Neck is supple without bruit.    Cardiac exam no murmur or gallop. Lungs are clear to auscultation. Distal pulses are well felt. Neurological Exam :  Awake and alert oriented to time place and person. Diminished attention registration and recall. Speech is fluent without aphasia or dysarthria. Extraocular moments are full range without nystagmus. Blinks to threat bilaterally. Fundi were not visualized. Mild right lower facial asymmetry when she smiles. Tongue midline. Motor system exam no upper or lower eczema to drift but mild weakness of the right grip and intrinsic hand muscles. Orbits left over right upper extremity. Deep tendon reflexes  are symmetric. Ankles are depressed. Plantars are downgoing. Sensation is intact. Gait not tested. ASSESSMENT/PLAN [name] is a 62 y.o. female with history of hypertension and obesity found down by her sister.  Admitted to the hospital for DKA.  CT done when RN noted right greater than left grip showed subacute left frontal and left caudate infarcts.   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  Concern for carotid stenosis.  CT head acute to subacute left frontal and caudate infarcts.Small vessel disease. Atrophy.   MRI patchy multifocal early subacute left MCA infarcts  MRA small left ICA compared to right with decreased flow in left MCA distribution, etiology unknown.  Mild atherosclerotic changes.  Carotid Doppler  No significant extracranial stenosis   2D Echo  Echocardiogram showed normal ejection fraction without cardiac source of embolism.  CT angiogram of the neck shows high-grade proximal left ICA stenosis which is long segment and extends intracranially. Vascular surgery consult for consideration for left carotid revascularization outpatient.  LDL 82  HgbA1c >15.5  Heparin 5000 units sq tid for VTE prophylaxis  aspirin 81 mg daily prior to admission, now on No antithrombotic. Given  mild stroke, will place on aspirin 81 mg and plavix 75 mg daily x 3 weeks, then plavix alone. Orders adjusted. May readjust pending carotid findings.  Therapy recommendations:  CIR. Consult placed.  Disposition:  pending   Hx Hypertension Hypotension in setting of hypovolemia, resolved  Stable . Permissive hypertension (OK if < 220/120) but gradually normalize in 5-7 days . Long-term BP goal normotensive  Hyperlipidemia  Home meds: Crestor 20  Crestor increased to 40 mg in hospital  LDL 82, goal < 70  Continue statin at discharge  Diabetes type II, new diagnosis  Admitted for DKA, glucose is greater than 800   HgbA1c greater  than 15.5, goal < 7.0  Uncontrolled  Other Stroke Risk Factors  Other Active Problems  Respiratory insufficiency in setting of DKA  Severe anion gap metabolic acidosis, hyperchloremic secondary to DKA  Hospital day # 6   CT angiogram of the neck shows high-grade proximal left ICA stenosis which is long segment and extends intracranially. This is likely a previous dissection given changes of fibromuscular dysplasia on the opposite side.Continue  Dual antiplatelet therapy and aggressive risk factor modification. Discussed with patient and Dr. Erlinda Hong. Vascular surgery consult for consideration for left carotid revascularization. . Greater than 50% time during this 25 minute visitt was spent in counseling and cognition of care about her embolic stroke and answered questions.  Stroke team will sign off at this time  Personally examined patient and images, and have participated in and made any corrections needed to history, physical, neuro exam,assessment and plan as stated above.  I have personally obtained the history, evaluated lab date, reviewed imaging studies and agree with radiology interpretations.    Sarina Ill, MD Stroke Neurology   To contact Stroke Continuity provider, please refer to [email or web address] After hours, contact General Neurology

## 2017-07-12 NOTE — Consult Note (Signed)
DENTAL CONSULTATION  Date of Consultation:  07/12/2017 Patient Name:   Sandra Shelton Date of Birth:   November 13, 1955 Medical Record Number: [id number]  VITALS: BP (!) 173/65 (BP Location: Right Arm)   Pulse 86   Temp (!) 97.4 F (36.3 C) (Oral)   Resp 18   Ht 5\' 6"  (1.676 m)   Wt 171 lb 1.2 oz (77.6 kg)   SpO2 100%   BMI 27.61 kg/m   CHIEF COMPLAINT: Patient referred by Dr.Xu for dental consultation.  HPI: Folashade Gamboa is a 62 year old female recently admitted for diabetic ketoacidosis.  Patient subsequently found to have experienced a left-sided stroke.  Dental consultation was then requested to evaluate history of lower right quadrant dental  Discomfort to determine if there is any infection that would prevent proceeding with vascular surgery in 2 weeks with Dr. Oneida Alar.  The patient gives a history of lower right anterior lip tingling.  Patient had a series of dental restorations involving the lower right quadrant on 06/14/2017 by Dr. Quincy Simmonds.  This included a DO amalgam a #29 and DO/amalgam #30.  Patient subsequently experienced postoperative persistent tingling and numbness involving the lower anterior lip.  Patient was subsequent referred to Dr. Raj Janus, oral surgeon, for evaluation and treatment as indicated.  Dr. Hardie Shackleton saw the patient on 07/01/2017 and contacted Dr. Cathie Olden to determine the type of anesthetic used and ultimately prescribed a prednisone tapering Dosepak to determine if this would assist in resolution of the numbness and tingling.  Patient was to have followed up with Dr. Hardie Shackleton on 07/08/2017 but she was unable to do so due to the recent admission.  Patient indicates that the tingling and numbness has resolved somewhat but is still present.  Patient denies having any acute toothaches symptoms at this time.  I specifically asked about any mandibular anterior tooth problems and she denied having any tooth pain in this area.  PROBLEM LIST: Patient Active Problem List    Diagnosis Date Noted  . Cerebral thrombosis with cerebral infarction 07/08/2017  . DKA, type 2 ([place]) 07/06/2017  . DKA (diabetic ketoacidoses) ([place]) 07/06/2017  . Metabolic acidosis   . Acute renal failure ([place])   . Dehydration     PMH: Past Medical History:  Diagnosis Date  . Hypertension     PSH: Past Surgical History:  Procedure Laterality Date  . UTERINE FIBROID SURGERY      ALLERGIES: No Known Allergies  MEDICATIONS: Current Facility-Administered Medications  Medication Dose Route Frequency Provider Last Rate Last Dose  . acetaminophen (TYLENOL) tablet 650 mg  650 mg Oral Q6H PRN Florencia Reasons, MD      . amLODipine (NORVASC) tablet 5 mg  5 mg Oral Daily Jennelle Human B, NP   5 mg at 07/12/17 0930  . amoxicillin-clavulanate (AUGMENTIN) 875-125 MG per tablet 1 tablet  1 tablet Oral Q12H Kipp Brood, MD   1 tablet at 07/12/17 0930  . aspirin chewable tablet 81 mg  81 mg Oral Daily Jennelle Human B, NP   81 mg at 07/12/17 0930  . clopidogrel (PLAVIX) tablet 75 mg  75 mg Oral Daily Burnetta Sabin L, NP   75 mg at 07/12/17 0930  . heparin injection 5,000 Units  5,000 Units Subcutaneous Q8H Jennelle Human B, NP   5,000 Units at 07/12/17 1306  . insulin aspart (novoLOG) injection 0-15 Units  0-15 Units Subcutaneous TID WC Anders Simmonds, MD   8 Units at 07/12/17 1306  . insulin aspart (novoLOG) injection 0-5 Units  0-5 Units Subcutaneous QHS Anders Simmonds, MD      . insulin aspart protamine- aspart (NOVOLOG MIX 70/30) injection 8 Units  8 Units Subcutaneous BID WC Florencia Reasons, MD      . magnesium sulfate IVPB 2 g 50 mL  2 g Intravenous Once Florencia Reasons, MD      . potassium chloride SA (K-DUR,KLOR-CON) CR tablet 40 mEq  40 mEq Oral Once Florencia Reasons, MD      . rosuvastatin (CRESTOR) tablet 40 mg  40 mg Oral q1800 Kipp Brood, MD   40 mg at 07/11/17 1738  . saccharomyces boulardii (FLORASTOR) capsule 250 mg  250 mg Oral BID Florencia Reasons, MD   250 mg at 07/12/17 0930    LABS: Lab Results   Component Value Date   WBC 7.9 07/10/2017   HGB 12.5 07/10/2017   HCT 39.9 07/10/2017   MCV 89.5 07/10/2017   PLT 121 (L) 07/10/2017      Component Value Date/Time   NA 141 07/12/2017 0924   K 3.1 (L) 07/12/2017 0924   CL 107 07/12/2017 0924   CO2 24 07/12/2017 0924   GLUCOSE 263 (H) 07/12/2017 0924   BUN 5 (L) 07/12/2017 0924   CREATININE 0.74 07/12/2017 0924   CALCIUM 9.1 07/12/2017 0924   GFRNONAA >60 07/12/2017 0924   GFRAA >60 07/12/2017 0924   Lab Results  Component Value Date   INR 1.23 07/05/2017   No results found for: PTT  SOCIAL HISTORY: Social History   Socioeconomic History  . Marital status: Single    Spouse name: Not on file  . Number of children: Not on file  . Years of education: Not on file  . Highest education level: Not on file  Occupational History  . Not on file  Social Needs  . Financial resource strain: Not on file  . Food insecurity:    Worry: Not on file    Inability: Not on file  . Transportation needs:    Medical: Not on file    Non-medical: Not on file  Tobacco Use  . Smoking status: Never Smoker  . Smokeless tobacco: Never Used  Substance and Sexual Activity  . Alcohol use: No  . Drug use: No  . Sexual activity: Not on file  Lifestyle  . Physical activity:    Days per week: Not on file    Minutes per session: Not on file  . Stress: Not on file  Relationships  . Social connections:    Talks on phone: Not on file    Gets together: Not on file    Attends religious service: Not on file    Active member of club or organization: Not on file    Attends meetings of clubs or organizations: Not on file    Relationship status: Not on file  . Intimate partner violence:    Fear of current or ex partner: Not on file    Emotionally abused: Not on file    Physically abused: Not on file    Forced sexual activity: Not on file  Other Topics Concern  . Not on file  Social History Narrative  . Not on file    FAMILY  HISTORY: Family History  Problem Relation Age of Onset  . Diabetes Mellitus II Sister   . Stroke Mother   . Cancer Father     REVIEW OF SYSTEMS: Reviewed with the patient as per History of present illness. Psych: Patient denies having dental phobia.  DENTAL  HISTORY: CHIEF COMPLAINT: Patient referred by Dr.Xu for dental consultation.  HPI: Maycie Luera is a 62 year old female recently admitted for diabetic ketoacidosis.  Patient subsequently found to have experienced a left-sided stroke.  Dental consultation was then requested to evaluate history of lower right quadrant dental  Discomfort.  The patient gives a history of lower right anterior lip tingling.  Patient had a series of dental restorations involving the lower right quadrant on 06/14/2017 by Dr. Quincy Simmonds.  This included a DO amalgam a #29 and DO/amalgam #30.  Patient subsequently experienced postoperative persistent tingling and numbness involving the lower anterior lip.  Patient was subsequent referred to Dr. Raj Janus, oral surgeon, for evaluation and treatment as indicated.  Dr. Hardie Shackleton saw the patient on 07/01/2017 and contacted Dr. Cathie Olden to determine the type of anesthetic used and ultimately prescribed a prednisone tapering Dosepak to determine if this would assist in resolution of the numbness and tingling.  Patient was to have followed up with Dr. Hardie Shackleton on 07/08/2017 but she was unable to do so due to the recent admission.  Patient indicates that the tingling and numbness has resolved somewhat but is still present.  Patient denies having any acute toothaches symptoms at this time.  I specifically asked about any mandibular anterior tooth problems and she denied having any tooth pain in this area.   DENTAL EXAMINATION: GENERAL: The patient is a well-developed well-nourished female laying in hospital bed. HEAD AND NECK: No obvious neck lymphadenopathy.  The patient denies any acute TMJ symptoms. INTRAORAL EXAM: Patient has normal  saliva.  There is no evidence of oral abscess formation.  There is no evidence of intraoral swellings. DENTITION: Patient has a generally intact dentition but appears to be missing tooth numbers 1, 16, 18, and 32. PERIODONTAL: Patient has chronic periodontitis with plaque and calculus accumulations, selective areas gingival recession and no significant tooth mobility noted. DENTAL CARIES/SUBOPTIMAL RESTORATIONS: She has multiple dental restorations noted. ENDODONTIC: The patient is currently  denying any acute pulpitis symptoms.  I do not see any evidence of periapical pathology or radiolucency of the orthopantogram is suboptimal secondary to patient movement and artifact. CROWN AND BRIDGE: Patient appears to have one crown restoration. PROSTHODONTIC: She denies having partial dentures. OCCLUSION: The patient has a stable occlusion at this time.  RADIOGRAPHIC INTERPRETATION: Orthopantogram was taken on 07/12/2017.  This is suboptimal secondary to patient movement and maxillary mandibular anterior artifact. There are multiple missing teeth.  There is incipient a moderate bone loss noted there are multiple dental restorations no.  There are no obvious periapical radiolucencies or pathology.  No significant dental caries are noted.   ASSESSMENTS: 1.  History of diabetic ketoacidosis 2.  Status post left acute CVA-this admission  3.  History of persistent tingling/numbness involving the mandibular right anterior lip possibly induced from previous anesthetic injection during restoration on 06/14/2017.  4.  No history of acute pulpitis symptoms 5.  No obvious periapical pathology radiolucencies 6.  No obvious oral abscesses or intraoral swelling  PLAN/RECOMMENDATIONS: 1. I was asked to see the patient and evaluate patient for dental etiology for leukocytosis and abscess formation.  I do not see any evidence of oral abscess formation or periapical pathology or radiolucencies.  Patient does have  persistent numbness to her mandibular anterior lip after previous dental restorations on 06/14/2017 by her primary dentist.  She was seen by Dr. Hardie Shackleton, the oral surgeon for further evaluation.  I would suggest follow-up with Dr. Raj Janus, oral surgery, as an  outpatient to determine the next course of action for this persistent lip numbness.  Dr. Hardie Shackleton can also provide input on whether he feels there is any possible dental infection that would prevent following through with the vascular surgery in 2 weeks.    2. Discussion of findings with medical team and coordination of future medical and dental care as needed.    Lenn Cal, DDS

## 2017-07-12 NOTE — Progress Notes (Signed)
Physical Therapy Treatment Patient Details Name: Sandra Shelton MRN: [id number] DOB: 07-24-1955 Today's Date: 07/12/2017    History of Present Illness Presents to ED on 6/21 after being found obtunded at home. Upon arrival to ED BP 76/50. Afebrile, Tachycardiac. Glucose 1265. 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 DKA. No previous hx of DM.    PT Comments    Patient is progressing very well towards their physical therapy goals. Session focused on continued dual tasking with ambulation, high level balance activities, and stair negotiation. Patient ambulating 450 feet with no device with supervision and slow speed. Negotiated 12 steps with railing and min guard assist to prepare for discharge to sister's home. 5 Times Sit to Stand: 22.9 seconds which indicates patient is at risk for falls (compared to norm 12 seconds). Continue to recommend outpatient neuro PT.    Follow Up Recommendations  Outpatient PT;Supervision for mobility/OOB     Equipment Recommendations  None recommended by PT    Recommendations for Other Services Rehab consult     Precautions / Restrictions Precautions Precautions: Fall Restrictions Weight Bearing Restrictions: No    Mobility  Bed Mobility               General bed mobility comments: OOB in recliner  Transfers Overall transfer level: Independent Equipment used: None Transfers: Sit to/from Stand Sit to Stand: Independent            Ambulation/Gait Ambulation/Gait assistance: Supervision Gait Distance (Feet): 450 Feet Assistive device: None Gait Pattern/deviations: Step-through pattern;Decreased stride length;Narrow base of support;Drifts right/left Gait velocity: 1.06 ft/s Gait velocity interpretation: <1.31 ft/sec, indicative of household ambulator General Gait Details: Patient with narrow BOS, slow speed, and decreased  reciprocal arm swing. Noted significantly decreased gait speed with dual tasking/distractions, including naming states.   Stairs Stairs: Yes Stairs assistance: Min guard Stair Management: One rail Left Number of Stairs: 12 General stair comments: step over step pattern with ascending and step by step pattern with descending. Requiring min guard for descending   Wheelchair Mobility    Modified Rankin (Stroke Patients Only) Modified Rankin (Stroke Patients Only) Pre-Morbid Rankin Score: No symptoms Modified Rankin: Moderate disability     Balance Overall balance assessment: Needs assistance Sitting-balance support: Feet supported Sitting balance-Leahy Scale: Good     Standing balance support: During functional activity Standing balance-Leahy Scale: Good               High level balance activites: Backward walking;Side stepping with supervision              Cognition Arousal/Alertness: Awake/alert Behavior During Therapy: WFL for tasks assessed/performed Overall Cognitive Status: Impaired/Different from baseline Area of Impairment: Problem solving                   Current Attention Level: Selective Memory: Decreased recall of precautions;Decreased short-term memory(intact immediate recall; poor delayed recall) Following Commands: Follows multi-step commands with increased time Safety/Judgement: Decreased awareness of safety;Decreased awareness of deficits Awareness: Emergent Problem Solving: Slow processing;Difficulty sequencing;Requires verbal cues;Requires tactile cues        Exercises Other Exercises Other Exercises: 5 Times Sit to Stand: 22.90 seconds    General Comments        Pertinent Vitals/Pain Pain Assessment: Faces Faces Pain Scale: Hurts a little bit Pain Location: right sided jaw/teeth pain; MD aware Pain Descriptors / Indicators: Aching Pain Intervention(s): Monitored during session  Home Living                       Prior Function            PT Goals (current goals can now be found in the care plan section) Acute Rehab PT Goals Patient Stated Goal: to get better PT Goal Formulation: With patient/family Time For Goal Achievement: 08/22/17 Potential to Achieve Goals: Good Progress towards PT goals: Progressing toward goals    Frequency    Min 4X/week      PT Plan Discharge plan needs to be updated    Co-evaluation              AM-PAC PT "6 Clicks" Daily Activity  Outcome Measure  Difficulty turning over in bed (including adjusting bedclothes, sheets and blankets)?: None Difficulty moving from lying on back to sitting on the side of the bed? : None Difficulty sitting down on and standing up from a chair with arms (e.g., wheelchair, bedside commode, etc,.)?: A Little Help needed moving to and from a bed to chair (including a wheelchair)?: A Little Help needed walking in hospital room?: A Little Help needed climbing 3-5 steps with a railing? : A Little 6 Click Score: 20    End of Session Equipment Utilized During Treatment: Gait belt Activity Tolerance: Patient tolerated treatment well Patient left: with call bell/phone within reach;in chair Nurse Communication: Mobility status PT Visit Diagnosis: Unsteadiness on feet (R26.81);Hemiplegia and hemiparesis Hemiplegia - Right/Left: Right Hemiplegia - caused by: Cerebral infarction     Time: 0092-3300 PT Time Calculation (min) (ACUTE ONLY): 26 min  Charges:  $Therapeutic Activity: 23-37 mins                    G Codes:      Sandra Shelton, PT, DPT Acute Rehabilitation Services  Pager: [phone]    Sandra Shelton 07/12/2017, 12:17 PM

## 2017-07-12 NOTE — Progress Notes (Signed)
PROGRESS NOTE  Sandra Shelton HGD:[id number] DOB: December 06, 1955 DOA: 07/05/2017 PCP: Lin Landsman, MD  HPI/Recap of past 24 hours:  She is sitting up in chair She C/o right sided teeth pain   Assessment/Plan: Active Problems:   DKA, type 2 ([place])   DKA (diabetic ketoacidoses) ([place])   Cerebral thrombosis with cerebral infarction  newly diagnosed uncontrolled diabetes mellitus with diabetes ketoacidosis -Admitted to ICU and managed with IV insulin, aggressive IV hydration.  A1c of 15.5 -dka resolved, she is transitioned to subQ insulin, she does not have insurance, she reports she is able to afford insulin and supplies. -Switch to insulin 70/30 titrate dose,  -plan to d/c on insulin 70/30 and metformin  Severe sepsis with acute metabolic encephalopathy presented on admission with possible dental abscess, she is started on augmentin, -No facial or dental abscess seen on maxillofacial CT.  Sepsis now resolved. Confusion resolved. -Vascular surgery recommended dental medicine consultation prior to any vascular intervention, case discussed with dental medicine Dr Thea Silversmith, will follow recommendation    New acute left MCA stroke  - on 6/24 (3 days after hospitalization)  she was noted to have facial droop and diminished right hand grip.  Head CT was ordered showing subacute left frontal and left caudate infarcts.  MRI brain showed patchy multifocal early subacute left MCA infarct.  MRA also concerning for high grade left carotid stenosis. - 2D echo shows LVH with vigorous LV function of 96-22%, grade 1 diastolic dysfunction.  No cardiac embolic source -CTA neck with left  ICA stenosis,  -vascular surgery and neurology input appreciated -On plavix/asa/statin  AKI on CKDII -cr peaked at 4.6 -cr today 0.7  Hypokalemia/hypomagnesemia: Replace k/mag  Code Status: full  Family Communication: patient   Disposition Plan: not ready for discharge, needs vascular surgery clearance     Consultants:  Neurology  Cir  Vascular surgery  Dental medicine Dr Nelida Meuse  Procedures:  none  Antibiotics:  augmentin   Objective: BP (!) 173/65 (BP Location: Right Arm)   Pulse 86   Temp (!) 97.4 F (36.3 C) (Oral)   Resp 18   Ht 5\' 6"  (1.676 m)   Wt 77.6 kg (171 lb 1.2 oz)   SpO2 100%   BMI 27.61 kg/m   Intake/Output Summary (Last 24 hours) at 07/12/2017 1028 Last data filed at 07/11/2017 2201 Gross per 24 hour  Intake 480 ml  Output 0 ml  Net 480 ml   Filed Weights   07/08/17 0400 07/10/17 2016 07/11/17 2106  Weight: 80 kg (176 lb 5.9 oz) 77.6 kg (171 lb 1.1 oz) 77.6 kg (171 lb 1.2 oz)    Exam: Patient is examined daily including today on 07/12/2017, exams remain the same as of yesterday except that has changed    General:  NAD  Cardiovascular: RRR  Respiratory: CTABL  Abdomen: Soft/ND/NT, positive BS  Musculoskeletal: No Edema  Neuro: alert, oriented , mild right sided facial droop  Data Reviewed: Basic Metabolic Panel: Recent Labs  Lab 07/06/17 0201  07/07/17 0312 07/07/17 2979 07/07/17 1547 07/08/17 0251 07/08/17 1539 07/09/17 0646 07/10/17 0441  NA 159*   < >  --  159* 155* 161* 156* 150* 147*  K 2.6*   < >  --  3.6 4.2 3.1*  --  2.9* 3.3*  CL 124*   < >  --  >130* 124* 128*  --  117* 110  CO2 16*   < >  --  21* 19* 23  --  26  25  GLUCOSE 662*   < >  --  221* 355* 141*  --  124* 143*  BUN 90*   < >  --  60* 49* 36*  --  15 13  CREATININE 4.13*   < >  --  2.62* 2.25* 1.83*  --  1.12* 1.01*  CALCIUM 9.2   < >  --  8.8* 8.6* 8.9  --  8.1* 8.4*  MG 3.8*  --  2.9*  --   --   --   --  1.8  --   PHOS 1.2*  --   --   --   --   --   --  2.1* 1.8*   < > = values in this interval not displayed.   Liver Function Tests: Recent Labs  Lab 07/05/17 2242 07/09/17 0646 07/10/17 0441  AST 30  --   --   ALT 21  --   --   ALKPHOS 82  --   --   BILITOT 1.4*  --   --   PROT 8.1  --   --   ALBUMIN 4.0 2.2* 2.4*   No results for  input(s): LIPASE, AMYLASE in the last 168 hours. Recent Labs  Lab 07/06/17 0236  AMMONIA 23   CBC: Recent Labs  Lab 07/05/17 2242  07/06/17 0107 07/07/17 0611 07/08/17 0251 07/09/17 0646 07/10/17 0441  WBC 18.2*  --   --  23.4* 18.5* 12.4* 7.9  NEUTROABS 16.3*  --   --   --   --   --   --   HGB 18.4*   < > 17.3* 13.2 13.8 12.2 12.5  HCT 64.5*   < > 51.0* 40.9 43.2 39.1 39.9  MCV 100.0  --   --  87.6 88.3 89.5 89.5  PLT 329  --   --  149* 121* 102* 121*   < > = values in this interval not displayed.   Cardiac Enzymes:   Recent Labs  Lab 07/06/17 0128 07/06/17 0643 07/06/17 1346 07/06/17 1514  TROPONINI 0.06* 0.10* 0.20* 0.39*   BNP (last 3 results) No results for input(s): BNP in the last 8760 hours.  ProBNP (last 3 results) No results for input(s): PROBNP in the last 8760 hours.  CBG: Recent Labs  Lab 07/11/17 0748 07/11/17 1136 07/11/17 1655 07/11/17 2107 07/12/17 0759  GLUCAP 118* 248* 95 164* 180*    Recent Results (from the past 240 hour(s))  Blood culture (routine x 2)     Status: None   Collection Time: 07/05/17 10:30 PM  Result Value Ref Range Status   Specimen Description BLOOD RIGHT ANTECUBITAL  Final   Special Requests   Final    BOTTLES DRAWN AEROBIC AND ANAEROBIC Blood Culture results may not be optimal due to an inadequate volume of blood received in culture bottles   Culture   Final    NO GROWTH 5 DAYS Performed at [facility], [place] [address], [place], [place] [address]    Report Status 07/10/2017 FINAL  Final  Blood culture (routine x 2)     Status: None   Collection Time: 07/05/17 10:45 PM  Result Value Ref Range Status   Specimen Description BLOOD LEFT ARM  Final   Special Requests   Final    BOTTLES DRAWN AEROBIC AND ANAEROBIC Blood Culture adequate volume   Culture   Final    NO GROWTH 5 DAYS Performed at [facility], [place],  [place] [address]    Report Status 07/10/2017 FINAL  Final  Urine  culture     Status: None   Collection Time: 07/05/17 11:49 PM  Result Value Ref Range Status   Specimen Description URINE, CATHETERIZED  Final   Special Requests NONE  Final   Culture   Final    NO GROWTH Performed at [facility], [place] [address] [place], [place] [address]    Report Status 07/07/2017 FINAL  Final  MRSA PCR Screening     Status: None   Collection Time: 07/06/17  3:11 AM  Result Value Ref Range Status   MRSA by PCR NEGATIVE NEGATIVE Final    Comment:        The GeneXpert MRSA Assay (FDA approved for NASAL specimens only), is one component of a comprehensive MRSA colonization surveillance program. It is not intended to diagnose MRSA infection nor to guide or monitor treatment for MRSA infections. Performed at [facility], [place] [address] [place], [place] [address]      Studies: No results found.  Scheduled Meds: . amLODipine  5 mg Oral Daily  . amoxicillin-clavulanate  1 tablet Oral Q12H  . aspirin  81 mg Oral Daily  . clopidogrel  75 mg Oral Daily  . heparin injection (subcutaneous)  5,000 Units Subcutaneous Q8H  . insulin aspart  0-15 Units Subcutaneous TID WC  . insulin aspart  0-5 Units Subcutaneous QHS  . insulin glargine  10 Units Subcutaneous Daily  . potassium & sodium phosphates  1 packet Oral TID WC & HS  . rosuvastatin  40 mg Oral q1800  . saccharomyces boulardii  250 mg Oral BID    Continuous Infusions:   Time spent: 35 mins, case discussed with dental medicine Dr Enrique Sack I have personally reviewed and interpreted on  07/12/2017 daily labs, tele strips, imagings as discussed above under date review session and assessment and plans.  I reviewed all nursing notes, pharmacy notes, consultant notes,  vitals, pertinent old records  I have discussed plan of care as described above with RN , patient  on 07/12/2017   Florencia Reasons MD, PhD  [facility] Pager [phone]. If 7PM-7AM, please contact night-coverage at [email or web address],  password [facility] 07/12/2017, 10:28 AM  LOS: 6 days

## 2017-07-12 NOTE — Progress Notes (Signed)
[facility]    Objective (!) 173/65 86 (!) 97.4 F (36.3 C) (Oral) 18 100%  Intake/Output Summary (Last 24 hours) at 07/12/2017 1535 Last data filed at 07/12/2017 1415 Gross per 24 hour  Intake 480 ml  Output 0 ml  Net 480 ml    Assessment/Planning: Dental recs noted Pt is on plavix asa statin max med mgmt Left TCAR carotid stent planned for July 11. Continue plavix aspirin and statin DO NOT stop pre procedure Our office will contact her regarding times dates places I will see her again on Monday if she remains inpt.  [name] 07/12/2017 3:35 PM --  Laboratory Lab Results: Recent Labs    07/10/17 0441  WBC 7.9  HGB 12.5  HCT 39.9  PLT 121*   BMET Recent Labs    07/10/17 0441 07/12/17 0924  NA 147* 141  K 3.3* 3.1*  CL 110 107  CO2 25 24  GLUCOSE 143* 263*  BUN 13 5*  CREATININE 1.01* 0.74  CALCIUM 8.4* 9.1    COAG Lab Results  Component Value Date   INR 1.23 07/05/2017   No results found for: PTT

## 2017-07-13 LAB — GLUCOSE, CAPILLARY
GLUCOSE-CAPILLARY: 159 mg/dL — AB (ref 70–99)
Glucose-Capillary: 154 mg/dL — ABNORMAL HIGH (ref 70–99)

## 2017-07-13 LAB — CBC
HCT: 35.8 % — ABNORMAL LOW (ref 36.0–46.0)
HEMOGLOBIN: 11.6 g/dL — AB (ref 12.0–15.0)
MCH: 28.1 pg (ref 26.0–34.0)
MCHC: 32.4 g/dL (ref 30.0–36.0)
MCV: 86.7 fL (ref 78.0–100.0)
PLATELETS: 275 10*3/uL (ref 150–400)
RBC: 4.13 MIL/uL (ref 3.87–5.11)
RDW: 12.6 % (ref 11.5–15.5)
WBC: 7.2 10*3/uL (ref 4.0–10.5)

## 2017-07-13 LAB — MAGNESIUM: Magnesium: 2.1 mg/dL (ref 1.7–2.4)

## 2017-07-13 LAB — COMPREHENSIVE METABOLIC PANEL
ALBUMIN: 2.5 g/dL — AB (ref 3.5–5.0)
ALK PHOS: 73 U/L (ref 38–126)
ALT: 46 U/L — ABNORMAL HIGH (ref 0–44)
ANION GAP: 11 (ref 5–15)
AST: 37 U/L (ref 15–41)
BUN: <5 mg/dL — ABNORMAL LOW (ref 8–23)
CALCIUM: 8.8 mg/dL — AB (ref 8.9–10.3)
CHLORIDE: 106 mmol/L (ref 98–111)
CO2: 25 mmol/L (ref 22–32)
Creatinine, Ser: 0.7 mg/dL (ref 0.44–1.00)
GFR calc non Af Amer: >60 mL/min (ref >60)
GLUCOSE: 157 mg/dL — AB (ref 70–99)
Potassium: 2.9 mmol/L — ABNORMAL LOW (ref 3.5–5.1)
SODIUM: 142 mmol/L (ref 135–145)
Total Bilirubin: 0.5 mg/dL (ref 0.3–1.2)
Total Protein: 5.5 g/dL — ABNORMAL LOW (ref 6.5–8.1)

## 2017-07-13 LAB — HEMOGLOBIN A1C
Hgb A1c MFr Bld: 14.9 % — ABNORMAL HIGH (ref 4.8–5.6)
MEAN PLASMA GLUCOSE: 380.93 mg/dL

## 2017-07-13 MED ORDER — POTASSIUM CHLORIDE CRYS ER 20 MEQ PO TBCR
40.0000 meq | EXTENDED_RELEASE_TABLET | ORAL | Status: AC
  Administered 2017-07-13 (×2): 40 meq via ORAL
  Filled 2017-07-13 (×2): qty 2

## 2017-07-13 MED ORDER — BLOOD GLUCOSE MONITOR KIT: PACK | 0 refills | Status: AC

## 2017-07-13 MED ORDER — INSULIN ASPART PROT & ASPART (70-30 MIX) 100 UNIT/ML ~~LOC~~ SUSP: 8.0000 [IU] | Freq: Two times a day (BID) | SUBCUTANEOUS | 3 refills | Status: DC

## 2017-07-13 MED ORDER — LISINOPRIL 2.5 MG PO TABS: 2.5000 mg | ORAL_TABLET | Freq: Every day | ORAL | 0 refills | Status: DC

## 2017-07-13 MED ORDER — "INSULIN SYRINGE 29G X 1/2"" 0.5 ML MISC": 0 refills | Status: DC

## 2017-07-13 MED ORDER — CLOPIDOGREL BISULFATE 75 MG PO TABS: 75.0000 mg | ORAL_TABLET | Freq: Every day | ORAL | 0 refills | Status: DC

## 2017-07-13 MED ORDER — "SYRINGE 27G X 1-1/4"" 3 ML MISC": 0 refills | Status: DC

## 2017-07-13 MED ORDER — ASPIRIN 81 MG PO TABS: 81.0000 mg | ORAL_TABLET | Freq: Every day | ORAL | 0 refills | Status: DC

## 2017-07-13 MED ORDER — POTASSIUM CHLORIDE ER 10 MEQ PO TBCR: 10.0000 meq | EXTENDED_RELEASE_TABLET | Freq: Every day | ORAL | 0 refills | Status: DC

## 2017-07-13 MED ORDER — ROSUVASTATIN CALCIUM 40 MG PO TABS: 40.0000 mg | ORAL_TABLET | Freq: Every day | ORAL | 0 refills | Status: DC

## 2017-07-13 MED ORDER — METFORMIN HCL 500 MG PO TABS: 500.0000 mg | ORAL_TABLET | Freq: Two times a day (BID) | ORAL | 0 refills | Status: DC

## 2017-07-13 MED ORDER — INSULIN ASPART 100 UNIT/ML FLEXPEN: PEN_INJECTOR | SUBCUTANEOUS | 0 refills | Status: DC

## 2017-07-13 MED ORDER — INSULIN GLARGINE 100 UNIT/ML SOLOSTAR PEN: 10.0000 [IU] | PEN_INJECTOR | Freq: Every day | SUBCUTANEOUS | 0 refills | Status: DC

## 2017-07-13 MED ORDER — LISINOPRIL 5 MG PO TABS
2.5000 mg | ORAL_TABLET | Freq: Once | ORAL | Status: AC
Start: 2017-07-13 — End: 2017-07-13
  Administered 2017-07-13: 2.5 mg via ORAL
  Filled 2017-07-13: qty 1

## 2017-07-13 NOTE — Discharge Summary (Signed)
Discharge Summary  Sandra Shelton TWS:[id number] DOB: Oct 22, 1955  PCP: Lin Landsman, MD  Admit date: 07/05/2017 Discharge date: 07/13/2017  Time spent: 75mns  Recommendations for Outpatient Follow-up:  1. F/u with PMD within a week  for hospital discharge follow up, repeat cbc/bmp at follow up. 2. F/u with vascular surgery 3. F/u with neurology stroke clinic 4. F/u with oral surgery  Discharge Diagnoses:  Active Hospital Problems   Diagnosis Date Noted  . Cerebral thrombosis with cerebral infarction 07/08/2017  . DKA, type 2 ([facility] 07/06/2017  . DKA (diabetic ketoacidoses) ([facility] 07/06/2017    Resolved Hospital Problems  No resolved problems to display.    Discharge Condition: stable  Diet recommendation: heart healthy/carb modified  Filed Weights   07/08/17 0400 07/10/17 2016 07/11/17 2106  Weight: 80 kg (176 lb 5.9 oz) 77.6 kg (171 lb 1.1 oz) 77.6 kg (171 lb 1.2 oz)    History of present illness: (per ICU admitting provider KDerrick Ravel HISTORY OF PRESENT ILLNESS:   62year old female with PMH of HTN  Presents to ED on 6/21 after being found obtunded at home. Sister states that she had spoke to patient hours before on phone and she said that she didn't feel good and could not get out of bed. Family reports fatigue, weight loss, polydipsia, and recent dental abscess. EMS reports that patient was non-responsive and glucose read HIGH. Upon arrival to ED BP 76/50. Afebrile, Tachycardiac. LA 10. WBC 18.2. Temp 98.5. Glucose 1265. PCCM asked to admit.     Hospital Course:  Active Problems:   [facility] [place]   DKA (diabetic ketoacidoses) ([facility])   Cerebral thrombosis with cerebral infarction   Newly diagnosed uncontrolled diabetes mellitus with diabetes ketoacidosis -Admitted to ICU and managed with IV insulin, aggressive IV hydration. A1c of 64.5 -dka resolved, she is transitioned to subQ insulin, she is transferred to hospitalist service on 6/26 -discharge on  [employer] insulin 70/30 8 units bid.  -resume home meds metformin Follow up with pcp.  Severe sepsis with acute metabolic encephalopathy presented on admission with recent h/o dental abscess, she finished treatment with augmentin in the hospital. -No facial or dental abscess seen on maxillofacial CT. Sepsis now resolved. Confusion improved, she is aaox3, but has poor memory. Not sure is baseline or due to stroke. -Vascular surgery recommended dental medicine consultation prior to any vascular intervention, case discussed with dental medicine Dr KThea Silversmith   per Dr KThea Silversmith " I do not see any evidence of oral abscess formation or periapical pathology or radiolucencies.  Patient does have persistent numbness to her mandibular anterior lip after previous dental restorations on 06/14/2017 by her primary dentist.  She was seen by Dr. RHardie Shackleton the oral surgeon for further evaluation.  I would suggest follow-up with Dr. RRaj Janus oral surgery, as an outpatient to determine the next course of action for this persistent lip numbness.  Dr. RHardie Shackletoncan also provide input on whether he feels there is any possible dental infection that would prevent following through with the vascular surgery in 2 weeks.  "    New acute left MCA stroke  -on 6/24 (3 days after hospitalization) she was noted to have facial droop and diminished right hand grip. Head CT was ordered showing subacute left frontal and left caudate infarcts. MRI brain showed patchy multifocal early subacute left MCA infarct. MRA also concerning for high grade left carotid stenosis. -2D echo shows LVH with vigorous LV function of 700-17% grade 1 diastolic dysfunction.  No cardiac embolic source -CTA neck with left  ICA stenosis,  -vascular surgery and neurology input appreciated -On plavix/asa/statin -Per vascular surgery , "continue to allow her BP to be elevated given the underflow mechanism of her stroke." prior to stent placement to left  ICA in 1-2 weeks.   HTN:  She is discharged on lisinopril, continue home meds dyazide.  Clonidine discontinued Per vascular surgery , "continue to allow her BP to be elevated given the underflow mechanism of her stroke." prior to stent placement to left ICA in 1-2 weeks.   AKI on CKDII -cr peaked at 4.6 -cr at discharge 0.7, aki resolved.  Hypokalemia/hypomagnesemia:  k/mag replaced in the hospital  she is discharged on potassium supplement for 7 days,  she is to follow up with pmd to monitor potassium and mag level in one week.  Body mass index is 27.61 kg/m.   Code Status: full  Family Communication: patient and sister  Disposition Plan: home with home health   Consultants:  Neurology  CIR  Vascular surgery  Dental medicine Dr Nelida Meuse  Procedures:  none  Antibiotics:  augmentin   Discharge Exam: BP (!) 176/76 (BP Location: Right Arm)   Pulse 86   Temp (!) 97.5 F (36.4 C) (Oral)   Resp 18   Ht 5' 6"  (1.676 m)   Wt 77.6 kg (171 lb 1.2 oz)   SpO2 100%   BMI 27.61 kg/m    General:  NAD  Cardiovascular: RRR  Respiratory: CTABL  Abdomen: Soft/ND/NT, positive BS  Musculoskeletal: No Edema  Neuro: alert, oriented , mild right sided facial droop   Discharge Instructions You were cared for by a hospitalist during your hospital stay. If you have any questions about your discharge medications or the care you received while you were in the hospital after you are discharged, you can call the unit and asked to speak with the hospitalist on call if the hospitalist that took care of you is not available. Once you are discharged, your primary care physician will handle any further medical issues. Please note that NO REFILLS for any discharge medications will be authorized once you are discharged, as it is imperative that you return to your primary care physician (or establish a relationship with a primary care physician if you do not have one) for  your aftercare needs so that they can reassess your need for medications and monitor your lab values.  Discharge Instructions    Ambulatory referral to Neurology   Complete by:  As directed    An appointment is requested in approximately: 4 weeks  Stroke clinic   Diet - low sodium heart healthy   Complete by:  As directed    Carb modified diet   Increase activity slowly   Complete by:  As directed      Allergies as of 07/13/2017   No Known Allergies     Medication List    STOP taking these medications   betamethasone dipropionate 0.05 % cream Commonly known as:  DIPROLENE   cloNIDine 0.2 MG tablet Commonly known as:  CATAPRES   meclizine 25 MG tablet Commonly known as:  ANTIVERT   metoCLOPramide 10 MG tablet Commonly known as:  REGLAN     TAKE these medications   aspirin 81 MG tablet Take 1 tablet (81 mg total) by mouth daily.   blood glucose meter kit and supplies Kit Dispense based on patient and insurance preference. Use up to four times daily as directed. (FOR  ICD-9 250.00, 250.01).   clopidogrel 75 MG tablet Commonly known as:  PLAVIX Take 1 tablet (75 mg total) by mouth daily. Start taking on:  07/14/2017   insulin aspart protamine- aspart (70-30) 100 UNIT/ML injection Commonly known as:  NOVOLOG MIX 70/30 Inject 0.08 mLs (8 Units total) into the skin 2 (two) times daily with a meal.   INSULIN SYRINGE .5CC/29G 29G X 1/2" 0.5 ML Misc For insulin injection   lisinopril 2.5 MG tablet Commonly known as:  PRINIVIL,ZESTRIL Take 1 tablet (2.5 mg total) by mouth daily.   metFORMIN 500 MG tablet Commonly known as:  GLUCOPHAGE Take 1 tablet (500 mg total) by mouth 2 (two) times daily with a meal.   potassium chloride 10 MEQ tablet Commonly known as:  K-DUR Take 1 tablet (10 mEq total) by mouth daily.   rosuvastatin 40 MG tablet Commonly known as:  CRESTOR Take 1 tablet (40 mg total) by mouth daily at 6 PM. What changed:    medication strength  how  much to take  when to take this   SYRINGE 3CC/27GX1-1/4" 27G X 1-1/4" 3 ML Misc For insulin injection   triamterene-hydrochlorothiazide 37.5-25 MG capsule Commonly known as:  DYAZIDE Take 1 capsule by mouth daily.      No Known Allergies Follow-up Information    Lin Landsman, MD Follow up in 1 week(s).   Specialty:  Family Medicine Why:  hospital discharge follow up, repeat cbc/bmp at hospital follow up. please check your blood glucose and blood pressure at home, bring in record for your doctor to reivew. Contact information: [address] [address] [phone]        f/u with oral surgeon Dr Raj Janus Follow up in 1 week(s).   Why:  Dr. Hardie Shackleton can also provide input on whether he feels there is any possible dental infection that would prevent following through with the vascular surgery in 2 weeks.         Elam Dutch, MD Follow up.   Specialties:  Vascular Surgery, Cardiology Contact information: [address] [place] [address] [phone]        Garvin Fila, MD Follow up in 4 week(s).   Specialties:  Neurology, Radiology Why:  for stroke follow up Contact information: [address] [place]  [address] [phone]            The results of significant diagnostics from this hospitalization (including imaging, microbiology, ancillary and laboratory) are listed below for reference.    Significant Diagnostic Studies: Dg Orthopantogram  Result Date: 07/12/2017 CLINICAL DATA:  62 year old female with acute pulpitis EXAM: ORTHOPANTOGRAM/PANORAMIC COMPARISON:  None. FINDINGS: Periapical lucency is present about the root of the left mandibular central incisor (tooth 23). The remaining teeth demonstrate normal appearing roots and bony interface. No definite lytic or blastic osseous lesion. There are several dental amalgams bilaterally. Overlap of the central and lateral incisors in the maxilla noted incidentally.  IMPRESSION: Positive for periapical lucency about the root of the left mandibular lateral incisor (223) suggesting underlying periodontal disease. Electronically Signed   By: Jacqulynn Cadet M.D.   On: 07/12/2017 11:34   Ct Head Wo Contrast  Result Date: 07/08/2017 CLINICAL DATA:  62 year old female with altered mental status and RIGHT facial pain and swelling. EXAM: CT HEAD WITHOUT CONTRAST CT MAXILLOFACIAL WITHOUT CONTRAST TECHNIQUE: Multidetector CT imaging of the head and maxillofacial structures were performed using the standard protocol without intravenous contrast. Multiplanar CT image reconstructions of the maxillofacial  structures were also generated. COMPARISON:  07/05/2017 head CT FINDINGS: CT HEAD FINDINGS Brain: Two small to moderate LEFT frontal infarcts and a LEFT caudate infarct appears acute to subacute and new on CT since 07/05/2017. There is no evidence of hemorrhage or mass effect. Atrophy and mild chronic small-vessel white matter ischemic changes are again noted. No midline shift, hydrocephalus or extra-axial collection identified. Vascular: Atherosclerotic calcifications again noted. Skull: Normal. Negative for fracture or focal lesion. Other: None. CT MAXILLOFACIAL FINDINGS Osseous: No fracture or mandibular dislocation. No destructive process. Orbits: Negative. No traumatic or inflammatory finding. Sinuses: Clear. Soft tissues: Negative. IMPRESSION: 1. 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 No hemorrhage or mass effect. 2. Mild atrophy and chronic small-vessel white matter ischemic changes. 3. No significant facial abnormality. Electronically Signed   By: Margarette Canada M.D.   On: 07/08/2017 14:43   Ct Head Wo Contrast  Result Date: 07/06/2017 CLINICAL DATA:  Altered level of consciousness. Patient was found in bed. Responsive to painful stimuli. EXAM: CT HEAD WITHOUT CONTRAST TECHNIQUE: Contiguous axial images were obtained from the base of  the skull through the vertex without intravenous contrast. COMPARISON:  MRI brain 10/28/2011 FINDINGS: Brain: Mild cerebral atrophy. No evidence of acute infarction, hemorrhage, hydrocephalus, extra-axial collection or mass lesion/mass effect. Vascular: Mild intracranial arterial vascular calcifications are present. Skull: Normal. Negative for fracture or focal lesion. Sinuses/Orbits: No acute finding. Other: None. IMPRESSION: No acute intracranial abnormalities.  Mild chronic atrophy. Electronically Signed   By: Lucienne Capers M.D.   On: 07/06/2017 00:48   Ct Angio Neck W Or Wo Contrast  Result Date: 07/10/2017 CLINICAL DATA:  Right facial asymmetry. EXAM: CT ANGIOGRAPHY NECK TECHNIQUE: Multidetector CT imaging of the neck was performed using the standard protocol during bolus administration of intravenous contrast. Multiplanar CT image reconstructions and MIPs were obtained to evaluate the vascular anatomy. Carotid stenosis measurements (when applicable) are obtained utilizing NASCET criteria, using the distal internal carotid diameter as the denominator. CONTRAST:  71m ISOVUE-370 IOPAMIDOL (ISOVUE-370) INJECTION 76% COMPARISON:  Head CT 07/08/2017 FINDINGS: Aortic arch: There is mild calcific atherosclerosis of the aortic arch. There is no aneurysm, dissection or hemodynamically significant stenosis of the visualized ascending aorta and aortic arch. Conventional 3 vessel aortic branching pattern. The visualized proximal subclavian arteries are widely patent. Right carotid system: --Common carotid artery: Approximately 50% narrowing of the midportion of the right common carotid artery. --Internal carotid artery: Mild atherosclerotic calcification at the bifurcation without hemodynamically significant stenosis of the right internal carotid artery. There is an undulating appearance of the distal right ICA. --External carotid artery: No acute abnormality. Left carotid system: --Common carotid artery: Widely  patent origin without common carotid artery dissection or aneurysm. --Internal carotid artery:There is severe stenosis of the entire length of the left internal carotid artery from the bifurcation to the skull base, greatest in the proximal portion where there is narrowing of the lumen to less than 2 mm. --External carotid artery: No acute abnormality. Vertebral arteries: Left dominant configuration. Both origins are normal. The right vertebral artery is diminutive along its entire course, likely congenital. The left vertebral artery is normal. Skeleton: There is no bony spinal canal stenosis. No lytic or blastic lesion. Other neck: Normal pharynx, larynx and major salivary glands. No cervical lymphadenopathy. Unremarkable thyroid gland. Upper chest: No pneumothorax or pleural effusion. No nodules or masses. Review of the MIP images confirms the above findings IMPRESSION: 1. Severe long segment stenosis of the  left internal carotid artery, from the bifurcation to the skull base and continuing intracranially. The time frame of this stenosis remains uncertain. 2. 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 3. Diminutive right vertebral artery, likely congenital. Electronically Signed   By: Ulyses Jarred M.D.   On: 07/10/2017 18:59   Mr Brain Wo Contrast  Result Date: 07/09/2017 CLINICAL DATA:  Initial evaluation for follow-up examination for acute stroke. EXAM: MRI HEAD WITHOUT CONTRAST MRA HEAD WITHOUT CONTRAST TECHNIQUE: Multiplanar, multiecho pulse sequences of the brain and surrounding structures were obtained without intravenous contrast. Angiographic images of the head were obtained using MRA technique without contrast. COMPARISON:  Prior CT from 07/08/2017. FINDINGS: MRI HEAD FINDINGS Brain: Cerebral volume within normal limits for age. No significant cerebral white matter changes. There are patchy multifocal acute to early subacute ischemic  infarcts involving the cortical gray matter and underlying subcortical white matter of the left frontal and parietal temporal regions, consistent with left MCA territory infarcts. Patchy involvement of the caudate. No associated mass effect or evidence for hemorrhage. No other acute or subacute ischemia. Gray-white matter differentiation otherwise maintained. No evidence for acute or chronic intracranial hemorrhage. No mass lesion, midline shift or mass effect. No hydrocephalus. No extra-axial fluid collection. Pituitary gland within normal limits. Vascular: Left ICA demonstrates a diminutive vascular flow void. Major intracranial vascular flow voids otherwise maintained. Skull and upper cervical spine: Craniocervical junction within normal limits. Upper cervical spine normal. Bone marrow signal intensity normal. No scalp soft tissue abnormality. Sinuses/Orbits: Globes orbital soft tissues within normal limits. Mild scattered mucosal thickening within the ethmoidal air cells. Paranasal sinuses are otherwise clear. No mastoid effusion. Other: None. MRA HEAD FINDINGS ANTERIOR CIRCULATION: Examination somewhat degraded by motion artifact. The left ICA is diminutive to the level of the terminus, of uncertain chronicity. Possible superimpose mild stenosis at the distal cervical left ICA. Right ICA patent to the terminus without hemodynamically significant stenosis. Mild carotid siphon atherosclerotic change. A1 segments, anterior communicating artery common anterior cerebral arteries patent without stenosis. M1 segments patent without stenosis or occlusion. There is overall decreased attenuation throughout the left MCA territory as compared to the right. No proximal M2 occlusion. Mild distal small vessel atheromatous irregularity within the MCA branches, most notable on the left. POSTERIOR CIRCULATION: Dominant left vertebral artery patent to the vertebrobasilar junction. Hypoplastic right vertebral artery largely  terminates in PICA, although a small branch ascending towards the vertebrobasilar junction. Right PICA patent. Left PICA not visualized. Basilar patent to its distal aspect without stenosis. Superior cerebellar arteries patent bilaterally. Short-segment moderate proximal right P2 stenosis, with additional mild narrowing distally. Left CCA widely patent. No intracranial aneurysm. IMPRESSION: MRI HEAD IMPRESSION: 1. Patchy multifocal acute to early subacute ischemic nonhemorrhagic left MCA territory infarcts. No significant mass effect. 2. Otherwise normal brain MRI for age. MRA HEAD IMPRESSION: 1. Diminutive left ICA as compared to the right, with overall decreased and attenuated flow within the left MCA distribution. Finding of unknown chronicity, although further evaluation with vascular imaging of the neck is suggested to fully evaluate these findings. 2. Mild atherosclerotic change involving the intracranial circulation for age. No proximal high-grade or correctable stenosis identified. Electronically Signed   By: Jeannine Boga M.D.   On: 07/09/2017 06:29   Dg Chest Port 1 View  Result Date: 07/06/2017 CLINICAL DATA:  Follow-up examination status post failed central line placement. EXAM: PORTABLE CHEST 1 VIEW COMPARISON:  Prior radiograph from 07/05/2017. FINDINGS: Cardiac and  mediastinal silhouettes are stable in size and contour, and remain within normal limits. Aortic atherosclerosis. Lungs are hypoinflated. No new focal airspace disease. No pulmonary edema or pleural effusion. No pneumothorax. No acute osseous abnormality. IMPRESSION: Stable appearance of the chest. No pneumothorax or other complication status post attempted central line placement. Electronically Signed   By: Jeannine Boga M.D.   On: 07/06/2017 02:58   Dg Chest Portable 1 View  Result Date: 07/05/2017 CLINICAL DATA:  62 year old female with altered mental status today. EXAM: PORTABLE CHEST 1 VIEW COMPARISON:  None.  FINDINGS: Portable AP supine view at 2259 hours. Rotated to the right. Mildly low lung volumes. Allowing for portable technique the lungs are clear. Normal cardiac size and mediastinal contours. Visualized tracheal air column is within normal limits. No pneumothorax. Paucity of bowel gas in the upper abdomen. No acute osseous abnormality identified. IMPRESSION: No acute cardiopulmonary abnormality. Electronically Signed   By: Genevie Ann M.D.   On: 07/05/2017 23:35   Mr Virgel Paling ML Contrast  Result Date: 07/09/2017 CLINICAL DATA:  Initial evaluation for follow-up examination for acute stroke. EXAM: MRI HEAD WITHOUT CONTRAST MRA HEAD WITHOUT CONTRAST TECHNIQUE: Multiplanar, multiecho pulse sequences of the brain and surrounding structures were obtained without intravenous contrast. Angiographic images of the head were obtained using MRA technique without contrast. COMPARISON:  Prior CT from 07/08/2017. FINDINGS: MRI HEAD FINDINGS Brain: Cerebral volume within normal limits for age. No significant cerebral white matter changes. There are patchy multifocal acute to early subacute ischemic infarcts involving the cortical gray matter and underlying subcortical white matter of the left frontal and parietal temporal regions, consistent with left MCA territory infarcts. Patchy involvement of the caudate. No associated mass effect or evidence for hemorrhage. No other acute or subacute ischemia. Gray-white matter differentiation otherwise maintained. No evidence for acute or chronic intracranial hemorrhage. No mass lesion, midline shift or mass effect. No hydrocephalus. No extra-axial fluid collection. Pituitary gland within normal limits. Vascular: Left ICA demonstrates a diminutive vascular flow void. Major intracranial vascular flow voids otherwise maintained. Skull and upper cervical spine: Craniocervical junction within normal limits. Upper cervical spine normal. Bone marrow signal intensity normal. No scalp soft tissue  abnormality. Sinuses/Orbits: Globes orbital soft tissues within normal limits. Mild scattered mucosal thickening within the ethmoidal air cells. Paranasal sinuses are otherwise clear. No mastoid effusion. Other: None. MRA HEAD FINDINGS ANTERIOR CIRCULATION: Examination somewhat degraded by motion artifact. The left ICA is diminutive to the level of the terminus, of uncertain chronicity. Possible superimpose mild stenosis at the distal cervical left ICA. Right ICA patent to the terminus without hemodynamically significant stenosis. Mild carotid siphon atherosclerotic change. A1 segments, anterior communicating artery common anterior cerebral arteries patent without stenosis. M1 segments patent without stenosis or occlusion. There is overall decreased attenuation throughout the left MCA territory as compared to the right. No proximal M2 occlusion. Mild distal small vessel atheromatous irregularity within the MCA branches, most notable on the left. POSTERIOR CIRCULATION: Dominant left vertebral artery patent to the vertebrobasilar junction. Hypoplastic right vertebral artery largely terminates in PICA, although a small branch ascending towards the vertebrobasilar junction. Right PICA patent. Left PICA not visualized. Basilar patent to its distal aspect without stenosis. Superior cerebellar arteries patent bilaterally. Short-segment moderate proximal right P2 stenosis, with additional mild narrowing distally. Left CCA widely patent. No intracranial aneurysm. IMPRESSION: MRI HEAD IMPRESSION: 1. Patchy multifocal acute to early subacute ischemic nonhemorrhagic left MCA territory infarcts. No significant mass effect. 2. Otherwise normal brain MRI for  age. MRA HEAD IMPRESSION: 1. Diminutive left ICA as compared to the right, with overall decreased and attenuated flow within the left MCA distribution. Finding of unknown chronicity, although further evaluation with vascular imaging of the neck is suggested to fully evaluate  these findings. 2. Mild atherosclerotic change involving the intracranial circulation for age. No proximal high-grade or correctable stenosis identified. Electronically Signed   By: Jeannine Boga M.D.   On: 07/09/2017 06:29   Ct Maxillofacial Wo Contrast  Result Date: 07/08/2017 CLINICAL DATA:  61 year old female with altered mental status and RIGHT facial pain and swelling. EXAM: CT HEAD WITHOUT CONTRAST CT MAXILLOFACIAL WITHOUT CONTRAST TECHNIQUE: Multidetector CT imaging of the head and maxillofacial structures were performed using the standard protocol without intravenous contrast. Multiplanar CT image reconstructions of the maxillofacial structures were also generated. COMPARISON:  07/05/2017 head CT FINDINGS: CT HEAD FINDINGS Brain: Two small to moderate LEFT frontal infarcts and a LEFT caudate infarct appears acute to subacute and new on CT since 07/05/2017. There is no evidence of hemorrhage or mass effect. Atrophy and mild chronic small-vessel white matter ischemic changes are again noted. No midline shift, hydrocephalus or extra-axial collection identified. Vascular: Atherosclerotic calcifications again noted. Skull: Normal. Negative for fracture or focal lesion. Other: None. CT MAXILLOFACIAL FINDINGS Osseous: No fracture or mandibular dislocation. No destructive process. Orbits: Negative. No traumatic or inflammatory finding. Sinuses: Clear. Soft tissues: Negative. IMPRESSION: 1. 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 No hemorrhage or mass effect. 2. Mild atrophy and chronic small-vessel white matter ischemic changes. 3. No significant facial abnormality. Electronically Signed   By: Margarette Canada M.D.   On: 07/08/2017 14:43    Microbiology: Recent Results (from the past 240 hour(s))  Blood culture (routine x 2)     Status: None   Collection Time: 07/05/17 10:30 PM  Result Value Ref Range Status   Specimen Description BLOOD RIGHT ANTECUBITAL  Final    Special Requests   Final    BOTTLES DRAWN AEROBIC AND ANAEROBIC Blood Culture results may not be optimal due to an inadequate volume of blood received in culture bottles   Culture   Final    NO GROWTH 5 DAYS Performed at [facility], [place] [address] [place], [place] [address]    Report Status 07/10/2017 FINAL  Final  Blood culture (routine x 2)     Status: None   Collection Time: 07/05/17 10:45 PM  Result Value Ref Range Status   Specimen Description BLOOD LEFT ARM  Final   Special Requests   Final    BOTTLES DRAWN AEROBIC AND ANAEROBIC Blood Culture adequate volume   Culture   Final    NO GROWTH 5 DAYS Performed at [facility], [place] [address], [place], [place] [address]    Report Status 07/10/2017 FINAL  Final  Urine culture     Status: None   Collection Time: 07/05/17 11:49 PM  Result Value Ref Range Status   Specimen Description URINE, CATHETERIZED  Final   Special Requests NONE  Final   Culture   Final    NO GROWTH Performed at [facility], [place] [address] [place], [place] [address]    Report Status 07/07/2017 FINAL  Final  MRSA PCR Screening     Status: None   Collection Time: 07/06/17  3:11 AM  Result Value Ref Range Status   MRSA by PCR NEGATIVE NEGATIVE Final    Comment:  The GeneXpert MRSA Assay (FDA approved for NASAL specimens only), is one component of a comprehensive MRSA colonization surveillance program. It is not intended to diagnose MRSA infection nor to guide or monitor treatment for MRSA infections. Performed at [facility], [place] [address] [place], [place] [address]      Labs: Basic Metabolic Panel: Recent Labs  Lab 07/07/17 [phone]  07/08/17 0251 07/08/17 1539 07/09/17 0646 07/10/17 0441 07/12/17 0924 07/13/17 0754  NA  --    < > 161* 156* 150* 147* 141 142  K  --    < > 3.1*  --  2.9* 3.3* 3.1* 2.9*  CL  --    < > 128*  --  117* 110 107 106  CO2  --    < > 23  --  26 25 24 25   GLUCOSE  --    < > 141*   --  124* 143* 263* 157*  BUN  --    < > 36*  --  15 13 5* <5*  CREATININE  --    < > 1.83*  --  1.12* 1.01* 0.74 0.70  CALCIUM  --    < > 8.9  --  8.1* 8.4* 9.1 8.8*  MG 2.9*  --   --   --  1.8  --  1.6* 2.1  PHOS  --   --   --   --  2.1* 1.8* 2.9  --    < > = values in this interval not displayed.   Liver Function Tests: Recent Labs  Lab 07/09/17 0646 07/10/17 0441 07/13/17 0754  AST  --   --  37  ALT  --   --  46*  ALKPHOS  --   --  73  BILITOT  --   --  0.5  PROT  --   --  5.5*  ALBUMIN 2.2* 2.4* 2.5*   No results for input(s): LIPASE, AMYLASE in the last 168 hours. No results for input(s): AMMONIA in the last 168 hours. CBC: Recent Labs  Lab 07/07/17 0611 07/08/17 0251 07/09/17 0646 07/10/17 0441 07/13/17 0754  WBC 23.4* 18.5* 12.4* 7.9 7.2  HGB 13.2 13.8 12.2 12.5 11.6*  HCT 40.9 43.2 39.1 39.9 35.8*  MCV 87.6 88.3 89.5 89.5 86.7  PLT 149* 121* 102* 121* 275   Cardiac Enzymes: No results for input(s): CKTOTAL, CKMB, CKMBINDEX, TROPONINI in the last 168 hours. BNP: BNP (last 3 results) No results for input(s): BNP in the last 8760 hours.  ProBNP (last 3 results) No results for input(s): PROBNP in the last 8760 hours.  CBG: Recent Labs  Lab 07/12/17 1148 07/12/17 1713 07/12/17 2112 07/13/17 0747 07/13/17 1213  GLUCAP 280* 189* 195* 159* 154*       Signed:  Florencia Reasons MD, PhD  [facility] 07/13/2017, 8:38 PM

## 2017-07-13 NOTE — Care Management Note (Addendum)
Case Management Note  Patient Details  Name: Sandra Shelton MRN: [id number] Date of Birth: 03-Feb-1955  Subjective/Objective:                Patient will DC to home w [facility] services through [facility]. [place] letter to be delivered to patient prior to DC, RN aware. No other CM needs identified.    Action/Plan:   Expected Discharge Date:  07/13/17               Expected Discharge Plan:  [place]  In-House Referral:  Clinical Social Work  Discharge planning Services  CM Consult  Post Acute Care Choice:    Choice offered to:     DME Arranged:    DME Agency:     HH Arranged:  PT, RN [place] Agency:  [place]  Status of Service:  Completed, signed off  If discussed at [place] of Stay Meetings, dates discussed:    Additional Comments:  Carles Collet, RN 07/13/2017, 11:46 AM

## 2017-07-13 NOTE — Progress Notes (Signed)
Demetra Shiner to be D/C'd Home per MD order.  Discussed prescriptions and follow up appointments with the patient. Prescriptions given to patient, medication list explained in detail. Pt verbalized understanding.  Allergies as of 07/13/2017   No Known Allergies     Medication List    STOP taking these medications   betamethasone dipropionate 0.05 % cream Commonly known as:  DIPROLENE   cloNIDine 0.2 MG tablet Commonly known as:  CATAPRES   meclizine 25 MG tablet Commonly known as:  ANTIVERT   metoCLOPramide 10 MG tablet Commonly known as:  REGLAN     TAKE these medications   aspirin 81 MG tablet Take 1 tablet (81 mg total) by mouth daily.   blood glucose meter kit and supplies Kit Dispense based on patient and insurance preference. Use up to four times daily as directed. (FOR ICD-9 250.00, 250.01).   clopidogrel 75 MG tablet Commonly known as:  PLAVIX Take 1 tablet (75 mg total) by mouth daily. Start taking on:  07/14/2017   insulin aspart protamine- aspart (70-30) 100 UNIT/ML injection Commonly known as:  NOVOLOG MIX 70/30 Inject 0.08 mLs (8 Units total) into the skin 2 (two) times daily with a meal.   INSULIN SYRINGE .5CC/29G 29G X 1/2" 0.5 ML Misc For insulin injection   lisinopril 2.5 MG tablet Commonly known as:  PRINIVIL,ZESTRIL Take 1 tablet (2.5 mg total) by mouth daily.   metFORMIN 500 MG tablet Commonly known as:  GLUCOPHAGE Take 1 tablet (500 mg total) by mouth 2 (two) times daily with a meal.   potassium chloride 10 MEQ tablet Commonly known as:  K-DUR Take 1 tablet (10 mEq total) by mouth daily.   rosuvastatin 40 MG tablet Commonly known as:  CRESTOR Take 1 tablet (40 mg total) by mouth daily at 6 PM. What changed:    medication strength  how much to take  when to take this   SYRINGE 3CC/27GX1-1/4" 27G X 1-1/4" 3 ML Misc For insulin injection   triamterene-hydrochlorothiazide 37.5-25 MG capsule Commonly known as:  DYAZIDE Take 1 capsule  by mouth daily.       Vitals:   07/13/17 0440 07/13/17 1000  BP: (!) 166/76 (!) 176/76  Pulse: 87 86  Resp: 16 18  Temp: 98 F (36.7 C) (!) 97.5 F (36.4 C)  SpO2: 96% 100%    Skin clean, dry and intact without evidence of skin break down, no evidence of skin tears noted. IV catheter discontinued intact. Site without signs and symptoms of complications. Dressing and pressure applied. Pt denies pain at this time. No complaints noted.  An After Visit Summary was printed and given to the patient. Patient escorted via [place], and D/C home via private auto.

## 2017-07-16 ENCOUNTER — Other Ambulatory Visit: Payer: Self-pay | Admitting: *Deleted

## 2017-07-22 ENCOUNTER — Other Ambulatory Visit (HOSPITAL_COMMUNITY): Payer: Self-pay | Admitting: *Deleted

## 2017-07-22 NOTE — Pre-Procedure Instructions (Signed)
Sandra Shelton  07/22/2017    Your procedure is scheduled on Wednesday, July 24, 2017 at 8:30 AM.   Report to [facility] Entrance "A" Admitting Office at 6:30 AM.   Call this number if you have problems the morning of surgery:  [phone]   Remember:  Do not eat or drink after midnight tonight.  Take these medicines the morning of surgery with A SIP OF WATER: Aspirin, Clopidogrel (Plavix), Rosuvastatin (Crestor)  Tonight and in the AM, take 1/2 of your regular dose of Lantus insulin (you will take 5 units). Do not take Metformin in the AM.   How to Manage Your Diabetes Before Surgery   Why is it important to control my blood sugar before and after surgery?   Improving blood sugar levels before and after surgery helps healing and can limit problems.  A way of improving blood sugar control is eating a healthy diet by:  - Eating less sugar and carbohydrates  - Increasing activity/exercise  - Talk with your doctor about reaching your blood sugar goals  High blood sugars (greater than 180 mg/dL) can raise your risk of infections and slow down your recovery so you will need to focus on controlling your diabetes during the weeks before surgery.  Make sure that the doctor who takes care of your diabetes knows about your planned surgery including the date and location.  How do I manage my blood sugars before surgery?   Check your blood sugar the morning of your surgery when you wake up and every 2 hours until you get to the Short-Stay unit.  Treat a low blood sugar (less than 70 mg/dL) with 1/2 cup of clear juice (cranberry or apple), 4 glucose tablets, OR glucose gel.  Recheck blood sugar in 15 minutes after treatment (to make sure it is greater than 70 mg/dL).  If blood sugar is not greater than 70 mg/dL on re-check, call [phone] for further instructions.   Report your blood sugar to the Short-Stay nurse when you get to Short-Stay.  References:  [employer]  [facility], 2007 "How to Manage your Diabetes Before and After Surgery".    Do not wear jewelry, make-up or nail polish.  Do not wear lotions, powders, perfumes or deodorant.  Do not shave 48 hours prior to surgery.   Do not bring valuables to the hospital.  [facility] is not responsible for any belongings or valuables.  Contacts, dentures or bridgework may not be worn into surgery.  Leave your suitcase in the car.  After surgery it may be brought to your room.  For patients admitted to the hospital, discharge time will be determined by your treatment team.  [facility] - Preparing for Surgery  Before surgery, you can play an important role.  Because skin is not sterile, your skin needs to be as free of germs as possible.  You can reduce the number of germs on you skin by washing with CHG (chlorahexidine gluconate) soap before surgery.  CHG is an antiseptic cleaner which kills germs and bonds with the skin to continue killing germs even after washing.  Oral Hygiene is also important in reducing the risk of infection.  Remember to brush your teeth with your regular toothpaste the morning of surgery.  Please DO NOT use if you have an allergy to CHG or antibacterial soaps.  If your skin becomes reddened/irritated stop using the CHG and inform your nurse when you arrive at Short Stay.  Do not  shave (including legs and underarms) for at least 48 hours prior to the first CHG shower.  You may shave your face.  Please follow these instructions carefully:   1.  Shower with CHG Soap the night before surgery and the morning of Surgery.  2.  If you choose to wash your hair, wash your hair first as usual with your normal shampoo.  3.  After you shampoo, rinse your hair and body thoroughly to remove the shampoo. 4.  Use CHG as you would any other liquid soap.  You can apply chg directly to the skin and wash gently with a      scrungie or washcloth.           5.  Apply the CHG Soap to  your body ONLY FROM THE NECK DOWN.   Do not use on open wounds or open sores. Avoid contact with your eyes, ears, mouth and genitals (private parts).  Wash genitals (private parts) with your normal soap.  6.  Wash thoroughly, paying special attention to the area where your surgery will be performed.  7.  Thoroughly rinse your body with warm water from the neck down.  8.  DO NOT shower/wash with your normal soap after using and rinsing off the CHG Soap.  9.  Pat yourself dry with a clean towel.            10.  Wear clean pajamas.            11.  Place clean sheets on your bed the night of your first shower and do not sleep with pets.  Day of Surgery   Shower as above. Do not apply any lotions/deodorants the morning of surgery.   Please wear clean clothes to the hospital. Remember to brush your teeth with toothpaste.   Please read over the fact sheets that you were given.

## 2017-07-22 NOTE — Pre-Procedure Instructions (Signed)
Sandra Shelton  07/22/2017    Your procedure is scheduled on Wednesday, July 24, 2017 at 8:30 AM.   Report to [facility] Entrance "A" Admitting Office at 6:30 AM.   Call this number if you have problems the morning of surgery:  [phone]   Remember:  Do not eat or drink after midnight tonight.  Take these medicines the morning of surgery with A SIP OF WATER: Aspirin, Clopidogrel (Plavix), Rosuvastatin (Crestor)  Tonight and in the AM, take 1/2 of your regular dose of Lantus insulin (you will take 5 units). Do not take Metformin in the AM.    Do not wear jewelry, make-up or nail polish.  Do not wear lotions, powders, perfumes or deodorant.  Do not shave 48 hours prior to surgery.   Do not bring valuables to the hospital.  [facility] is not responsible for any belongings or valuables.  Contacts, dentures or bridgework may not be worn into surgery.  Leave your suitcase in the car.  After surgery it may be brought to your room.  For patients admitted to the hospital, discharge time will be determined by your treatment team.  [facility] - Preparing for Surgery  Before surgery, you can play an important role.  Because skin is not sterile, your skin needs to be as free of germs as possible.  You can reduce the number of germs on you skin by washing with CHG (chlorahexidine gluconate) soap before surgery.  CHG is an antiseptic cleaner which kills germs and bonds with the skin to continue killing germs even after washing.  Oral Hygiene is also important in reducing the risk of infection.  Remember to brush your teeth with your regular toothpaste the morning of surgery.  Please DO NOT use if you have an allergy to CHG or antibacterial soaps.  If your skin becomes reddened/irritated stop using the CHG and inform your nurse when you arrive at Short Stay.  Do not shave (including legs and underarms) for at least 48 hours prior to the first CHG shower.  You may shave your  face.  Please follow these instructions carefully:   1.  Shower with CHG Soap the night before surgery and the morning of Surgery.  2.  If you choose to wash your hair, wash your hair first as usual with your normal shampoo.  3.  After you shampoo, rinse your hair and body thoroughly to remove the shampoo. 4.  Use CHG as you would any other liquid soap.  You can apply chg directly to the skin and wash gently with a      scrungie or washcloth.           5.  Apply the CHG Soap to your body ONLY FROM THE NECK DOWN.   Do not use on open wounds or open sores. Avoid contact with your eyes, ears, mouth and genitals (private parts).  Wash genitals (private parts) with your normal soap.  6.  Wash thoroughly, paying special attention to the area where your surgery will be performed.  7.  Thoroughly rinse your body with warm water from the neck down.  8.  DO NOT shower/wash with your normal soap after using and rinsing off the CHG Soap.  9.  Pat yourself dry with a clean towel.            10.  Wear clean pajamas.            11.  Place clean sheets on your  bed the night of your first shower and do not sleep with pets.  Day of Surgery   Shower as above. Do not apply any lotions/deodorants the morning of surgery.   Please wear clean clothes to the hospital. Remember to brush your teeth with toothpaste.   Please read over the fact sheets that you were given.

## 2017-07-23 ENCOUNTER — Encounter (HOSPITAL_COMMUNITY): Payer: Self-pay

## 2017-07-23 ENCOUNTER — Encounter (HOSPITAL_COMMUNITY)
Admission: RE | Admit: 2017-07-23 | Discharge: 2017-07-23 | Disposition: A | Discharge status: Home / Self Care | Payer: Medicaid Other | Source: Ambulatory Visit | Attending: Vascular Surgery | Admitting: Vascular Surgery

## 2017-07-23 ENCOUNTER — Other Ambulatory Visit: Payer: Self-pay

## 2017-07-23 DIAGNOSIS — Z01812 Encounter for preprocedural laboratory examination: Secondary | ICD-10-CM

## 2017-07-23 HISTORY — DX: Peripheral vascular disease, unspecified: I73.9

## 2017-07-23 HISTORY — DX: Inflammatory liver disease, unspecified: K75.9

## 2017-07-23 HISTORY — DX: Gastro-esophageal reflux disease without esophagitis: K21.9

## 2017-07-23 HISTORY — DX: Type 2 diabetes mellitus without complications: E11.9

## 2017-07-23 HISTORY — DX: Cardiac murmur, unspecified: R01.1

## 2017-07-23 HISTORY — DX: Cerebral infarction, unspecified: I63.9

## 2017-07-23 LAB — TYPE AND SCREEN
ABO/RH(D): B NEG
Antibody Screen: NEGATIVE

## 2017-07-23 LAB — BLOOD GAS, ARTERIAL
Acid-base deficit: 0.5 mmol/L (ref 0.0–2.0)
Bicarbonate: 22.9 mmol/L (ref 20.0–28.0)
Drawn by: 470591
FIO2: 21
O2 SAT: 94.3 %
PCO2 ART: 32.8 mmHg (ref 32.0–48.0)
PO2 ART: 67.5 mmHg — AB (ref 83.0–108.0)
Patient temperature: 98.6
pH, Arterial: 7.458 — ABNORMAL HIGH (ref 7.350–7.450)

## 2017-07-23 LAB — COMPREHENSIVE METABOLIC PANEL
ALBUMIN: 3.6 g/dL (ref 3.5–5.0)
ALK PHOS: 47 U/L (ref 38–126)
ALT: 24 U/L (ref 0–44)
ANION GAP: 14 (ref 5–15)
AST: 23 U/L (ref 15–41)
BILIRUBIN TOTAL: 0.4 mg/dL (ref 0.3–1.2)
BUN: 9 mg/dL (ref 8–23)
CALCIUM: 10 mg/dL (ref 8.9–10.3)
CO2: 20 mmol/L — AB (ref 22–32)
CREATININE: 0.74 mg/dL (ref 0.44–1.00)
Chloride: 105 mmol/L (ref 98–111)
GFR calc non Af Amer: >60 mL/min (ref >60)
GLUCOSE: 167 mg/dL — AB (ref 70–99)
Potassium: 3.8 mmol/L (ref 3.5–5.1)
SODIUM: 139 mmol/L (ref 135–145)
TOTAL PROTEIN: 6.9 g/dL (ref 6.5–8.1)

## 2017-07-23 LAB — CBC
HEMATOCRIT: 37.4 % (ref 36.0–46.0)
HEMOGLOBIN: 12 g/dL (ref 12.0–15.0)
MCH: 27.8 pg (ref 26.0–34.0)
MCHC: 32.1 g/dL (ref 30.0–36.0)
MCV: 86.6 fL (ref 78.0–100.0)
Platelets: 455 10*3/uL — ABNORMAL HIGH (ref 150–400)
RBC: 4.32 MIL/uL (ref 3.87–5.11)
RDW: 12.3 % (ref 11.5–15.5)
WBC: 7.1 10*3/uL (ref 4.0–10.5)

## 2017-07-23 LAB — APTT: APTT: 29 s (ref 24–36)

## 2017-07-23 LAB — URINALYSIS, ROUTINE W REFLEX MICROSCOPIC
Bilirubin Urine: NEGATIVE
Glucose, UA: 150 mg/dL — AB
Hgb urine dipstick: NEGATIVE
Ketones, ur: NEGATIVE mg/dL
Leukocytes, UA: NEGATIVE
NITRITE: NEGATIVE
PH: 5 (ref 5.0–8.0)
Protein, ur: NEGATIVE mg/dL
SPECIFIC GRAVITY, URINE: 1.015 (ref 1.005–1.030)

## 2017-07-23 LAB — PROTIME-INR
INR: 0.98
Prothrombin Time: 12.9 seconds (ref 11.4–15.2)

## 2017-07-23 LAB — SURGICAL PCR SCREEN
MRSA, PCR: NEGATIVE
Staphylococcus aureus: NEGATIVE

## 2017-07-23 LAB — ABO/RH: ABO/RH(D): B NEG

## 2017-07-23 LAB — GLUCOSE, CAPILLARY: Glucose-Capillary: 149 mg/dL — ABNORMAL HIGH (ref 70–99)

## 2017-07-23 NOTE — Progress Notes (Signed)
Pt states she's been told that she has a heart murmur, she states she's never had any problems from it. Pt had a stroke in June. New onset of diabetes was noted upon arrival to the ED with the stroke like symptoms. Her CBG was >600 and her A1C was >15.4 (07/06/17). Pt states she is checking her blood sugar every day, states in the AM before eating it's usually between 74-130.

## 2017-07-24 ENCOUNTER — Inpatient Hospital Stay (HOSPITAL_COMMUNITY): Payer: Medicaid Other | Admitting: Certified Registered Nurse Anesthetist

## 2017-07-24 ENCOUNTER — Inpatient Hospital Stay (HOSPITAL_COMMUNITY)
Admission: RE | Admit: 2017-07-24 | Discharge: 2017-07-25 | DRG: 039 | Disposition: A | Discharge status: Home Health | Payer: Medicaid Other | Attending: Vascular Surgery | Admitting: Vascular Surgery

## 2017-07-24 ENCOUNTER — Telehealth: Payer: Self-pay | Admitting: Vascular Surgery

## 2017-07-24 ENCOUNTER — Other Ambulatory Visit: Payer: Self-pay

## 2017-07-24 ENCOUNTER — Encounter (HOSPITAL_COMMUNITY)
Admission: RE | Disposition: A | Discharge status: Home Health | Payer: Self-pay | Source: Home / Self Care | Attending: Vascular Surgery

## 2017-07-24 ENCOUNTER — Encounter (HOSPITAL_COMMUNITY): Payer: Self-pay | Admitting: Certified Registered Nurse Anesthetist

## 2017-07-24 DIAGNOSIS — E876 Hypokalemia: Secondary | ICD-10-CM | POA: Diagnosis present

## 2017-07-24 DIAGNOSIS — K219 Gastro-esophageal reflux disease without esophagitis: Secondary | ICD-10-CM | POA: Diagnosis present

## 2017-07-24 DIAGNOSIS — Z7984 Long term (current) use of oral hypoglycemic drugs: Secondary | ICD-10-CM | POA: Diagnosis not present

## 2017-07-24 DIAGNOSIS — Z7982 Long term (current) use of aspirin: Secondary | ICD-10-CM | POA: Diagnosis not present

## 2017-07-24 DIAGNOSIS — E118 Type 2 diabetes mellitus with unspecified complications: Secondary | ICD-10-CM | POA: Diagnosis present

## 2017-07-24 DIAGNOSIS — I10 Essential (primary) hypertension: Secondary | ICD-10-CM | POA: Diagnosis present

## 2017-07-24 DIAGNOSIS — Z833 Family history of diabetes mellitus: Secondary | ICD-10-CM | POA: Diagnosis not present

## 2017-07-24 DIAGNOSIS — Z79899 Other long term (current) drug therapy: Secondary | ICD-10-CM

## 2017-07-24 DIAGNOSIS — Z8673 Personal history of transient ischemic attack (TIA), and cerebral infarction without residual deficits: Secondary | ICD-10-CM

## 2017-07-24 DIAGNOSIS — I63132 Cerebral infarction due to embolism of left carotid artery: Secondary | ICD-10-CM | POA: Diagnosis present

## 2017-07-24 DIAGNOSIS — I6522 Occlusion and stenosis of left carotid artery: Secondary | ICD-10-CM | POA: Diagnosis present

## 2017-07-24 HISTORY — PX: TRANSCAROTID ARTERY REVASCULARIZATIONÂ: SHX6778

## 2017-07-24 HISTORY — DX: Cerebral infarction due to embolism of unspecified carotid artery: I63.139

## 2017-07-24 LAB — GLUCOSE, CAPILLARY
GLUCOSE-CAPILLARY: 126 mg/dL — AB (ref 70–99)
GLUCOSE-CAPILLARY: 92 mg/dL (ref 70–99)
Glucose-Capillary: 115 mg/dL — ABNORMAL HIGH (ref 70–99)
Glucose-Capillary: 131 mg/dL — ABNORMAL HIGH (ref 70–99)

## 2017-07-24 SURGERY — TRANSCAROTID ARTERY REVASCULARIZATION (TCAR)
Anesthesia: General | Site: Neck | Laterality: Left

## 2017-07-24 MED ORDER — ONDANSETRON HCL 4 MG/2ML IJ SOLN
INTRAMUSCULAR | Status: DC | PRN
  Administered 2017-07-24: 4 mg via INTRAVENOUS

## 2017-07-24 MED ORDER — LISINOPRIL 2.5 MG PO TABS
2.5000 mg | ORAL_TABLET | Freq: Every day | ORAL | Status: DC
  Administered 2017-07-24 – 2017-07-25 (×2): 2.5 mg via ORAL
  Filled 2017-07-24 (×2): qty 1

## 2017-07-24 MED ORDER — HYDRALAZINE HCL 20 MG/ML IJ SOLN: 5.0000 mg | INTRAMUSCULAR | Status: DC | PRN

## 2017-07-24 MED ORDER — FENTANYL CITRATE (PF) 100 MCG/2ML IJ SOLN
INTRAMUSCULAR | Status: AC
  Filled 2017-07-24: qty 2

## 2017-07-24 MED ORDER — SODIUM CHLORIDE 0.9 % IV SOLN: 500.0000 mL | Freq: Once | INTRAVENOUS | Status: DC | PRN

## 2017-07-24 MED ORDER — FENTANYL CITRATE (PF) 250 MCG/5ML IJ SOLN
INTRAMUSCULAR | Status: DC | PRN
  Administered 2017-07-24 (×2): 50 ug via INTRAVENOUS
  Administered 2017-07-24: 25 ug via INTRAVENOUS
  Administered 2017-07-24: 50 ug via INTRAVENOUS

## 2017-07-24 MED ORDER — LIDOCAINE HCL (PF) 1 % IJ SOLN
INTRAMUSCULAR | Status: AC
  Filled 2017-07-24: qty 30

## 2017-07-24 MED ORDER — SODIUM CHLORIDE 0.9 % IV SOLN
INTRAVENOUS | Status: AC
  Filled 2017-07-24: qty 1.2

## 2017-07-24 MED ORDER — ONDANSETRON HCL 4 MG/2ML IJ SOLN
INTRAMUSCULAR | Status: AC
  Filled 2017-07-24: qty 2

## 2017-07-24 MED ORDER — ROCURONIUM BROMIDE 10 MG/ML (PF) SYRINGE
PREFILLED_SYRINGE | INTRAVENOUS | Status: DC | PRN
  Administered 2017-07-24: 50 mg via INTRAVENOUS

## 2017-07-24 MED ORDER — MAGNESIUM SULFATE 2 GM/50ML IV SOLN: 2.0000 g | Freq: Every day | INTRAVENOUS | Status: DC | PRN

## 2017-07-24 MED ORDER — ASPIRIN EC 325 MG PO TBEC
325.0000 mg | DELAYED_RELEASE_TABLET | Freq: Every day | ORAL | Status: DC
  Administered 2017-07-25: 325 mg via ORAL
  Filled 2017-07-24: qty 1

## 2017-07-24 MED ORDER — MIDAZOLAM HCL 2 MG/2ML IJ SOLN
INTRAMUSCULAR | Status: DC | PRN
  Administered 2017-07-24: 1 mg via INTRAVENOUS

## 2017-07-24 MED ORDER — PROPOFOL 10 MG/ML IV BOLUS
INTRAVENOUS | Status: DC | PRN
  Administered 2017-07-24: 80 mg via INTRAVENOUS
  Administered 2017-07-24 (×4): 10 mg via INTRAVENOUS
  Administered 2017-07-24: 120 mg via INTRAVENOUS

## 2017-07-24 MED ORDER — ACETAMINOPHEN 325 MG PO TABS: 325.0000 mg | ORAL_TABLET | ORAL | Status: DC | PRN

## 2017-07-24 MED ORDER — SUGAMMADEX SODIUM 200 MG/2ML IV SOLN
INTRAVENOUS | Status: AC
  Filled 2017-07-24: qty 2

## 2017-07-24 MED ORDER — IODIXANOL 320 MG/ML IV SOLN
INTRAVENOUS | Status: DC | PRN
  Administered 2017-07-24: 35 mL via INTRA_ARTERIAL

## 2017-07-24 MED ORDER — PANTOPRAZOLE SODIUM 40 MG PO TBEC
40.0000 mg | DELAYED_RELEASE_TABLET | Freq: Every day | ORAL | Status: DC
  Administered 2017-07-25: 40 mg via ORAL
  Filled 2017-07-24: qty 1

## 2017-07-24 MED ORDER — LIDOCAINE 2% (20 MG/ML) 5 ML SYRINGE
INTRAMUSCULAR | Status: DC | PRN
  Administered 2017-07-24: 80 mg via INTRAVENOUS

## 2017-07-24 MED ORDER — SODIUM CHLORIDE 0.9 % IV SOLN
INTRAVENOUS | Status: DC
  Administered 2017-07-24: 14:00:00 via INTRAVENOUS

## 2017-07-24 MED ORDER — POTASSIUM CHLORIDE CRYS ER 10 MEQ PO TBCR
10.0000 meq | EXTENDED_RELEASE_TABLET | Freq: Every day | ORAL | Status: DC
  Administered 2017-07-24 – 2017-07-25 (×2): 10 meq via ORAL
  Filled 2017-07-24 (×4): qty 1

## 2017-07-24 MED ORDER — LIDOCAINE 2% (20 MG/ML) 5 ML SYRINGE
INTRAMUSCULAR | Status: AC
  Filled 2017-07-24: qty 5

## 2017-07-24 MED ORDER — ROCURONIUM BROMIDE 10 MG/ML (PF) SYRINGE
PREFILLED_SYRINGE | INTRAVENOUS | Status: AC
  Filled 2017-07-24: qty 10

## 2017-07-24 MED ORDER — GUAIFENESIN-DM 100-10 MG/5ML PO SYRP: 15.0000 mL | ORAL_SOLUTION | ORAL | Status: DC | PRN

## 2017-07-24 MED ORDER — HYDRALAZINE HCL 20 MG/ML IJ SOLN
INTRAMUSCULAR | Status: AC
  Filled 2017-07-24: qty 1

## 2017-07-24 MED ORDER — LABETALOL HCL 5 MG/ML IV SOLN: 10.0000 mg | INTRAVENOUS | Status: DC | PRN

## 2017-07-24 MED ORDER — ALUM & MAG HYDROXIDE-SIMETH 200-200-20 MG/5ML PO SUSP: 15.0000 mL | ORAL | Status: DC | PRN

## 2017-07-24 MED ORDER — CHLORHEXIDINE GLUCONATE CLOTH 2 % EX PADS: 6.0000 | MEDICATED_PAD | Freq: Once | CUTANEOUS | Status: DC

## 2017-07-24 MED ORDER — HEPARIN SODIUM (PORCINE) 1000 UNIT/ML IJ SOLN
INTRAMUSCULAR | Status: AC
  Filled 2017-07-24: qty 1

## 2017-07-24 MED ORDER — MEPERIDINE HCL 50 MG/ML IJ SOLN: 6.2500 mg | INTRAMUSCULAR | Status: DC | PRN

## 2017-07-24 MED ORDER — PROTAMINE SULFATE 10 MG/ML IV SOLN
INTRAVENOUS | Status: DC | PRN
  Administered 2017-07-24: 70 mg via INTRAVENOUS
  Administered 2017-07-24: 10 mg via INTRAVENOUS

## 2017-07-24 MED ORDER — AMLODIPINE BESYLATE 10 MG PO TABS
10.0000 mg | ORAL_TABLET | Freq: Every day | ORAL | Status: DC
  Administered 2017-07-25: 10 mg via ORAL
  Filled 2017-07-24: qty 1

## 2017-07-24 MED ORDER — HEPARIN SODIUM (PORCINE) 1000 UNIT/ML IJ SOLN
INTRAMUSCULAR | Status: DC | PRN
  Administered 2017-07-24: 8000 [IU] via INTRAVENOUS

## 2017-07-24 MED ORDER — FENTANYL CITRATE (PF) 250 MCG/5ML IJ SOLN
INTRAMUSCULAR | Status: AC
  Filled 2017-07-24: qty 5

## 2017-07-24 MED ORDER — MORPHINE SULFATE (PF) 2 MG/ML IV SOLN: 2.0000 mg | INTRAVENOUS | Status: DC | PRN

## 2017-07-24 MED ORDER — ESMOLOL HCL 100 MG/10ML IV SOLN
INTRAVENOUS | Status: DC | PRN
  Administered 2017-07-24: 30 mg via INTRAVENOUS

## 2017-07-24 MED ORDER — POTASSIUM CHLORIDE CRYS ER 20 MEQ PO TBCR: 20.0000 meq | EXTENDED_RELEASE_TABLET | Freq: Every day | ORAL | Status: DC | PRN

## 2017-07-24 MED ORDER — CEFAZOLIN SODIUM-DEXTROSE 2-4 GM/100ML-% IV SOLN
INTRAVENOUS | Status: AC
  Filled 2017-07-24: qty 100

## 2017-07-24 MED ORDER — HYDROMORPHONE HCL 1 MG/ML IJ SOLN
INTRAMUSCULAR | Status: AC
  Administered 2017-07-24: 0.25 mg via INTRAVENOUS
  Filled 2017-07-24: qty 1

## 2017-07-24 MED ORDER — PHENYLEPHRINE HCL 10 MG/ML IJ SOLN
INTRAMUSCULAR | Status: DC | PRN
  Administered 2017-07-24: 10 ug/min via INTRAVENOUS

## 2017-07-24 MED ORDER — METOPROLOL TARTRATE 5 MG/5ML IV SOLN: 2.0000 mg | INTRAVENOUS | Status: DC | PRN

## 2017-07-24 MED ORDER — LACTATED RINGERS IV SOLN
INTRAVENOUS | Status: DC | PRN
  Administered 2017-07-24: 08:00:00 via INTRAVENOUS

## 2017-07-24 MED ORDER — MIDAZOLAM HCL 2 MG/2ML IJ SOLN
INTRAMUSCULAR | Status: AC
  Filled 2017-07-24: qty 2

## 2017-07-24 MED ORDER — OXYCODONE HCL 5 MG PO TABS
ORAL_TABLET | ORAL | Status: AC
  Administered 2017-07-24: 5 mg via ORAL
  Filled 2017-07-24: qty 1

## 2017-07-24 MED ORDER — INSULIN ASPART 100 UNIT/ML ~~LOC~~ SOLN
0.0000 [IU] | Freq: Three times a day (TID) | SUBCUTANEOUS | Status: DC
  Administered 2017-07-25: 2 [IU] via SUBCUTANEOUS

## 2017-07-24 MED ORDER — PHENYLEPHRINE 40 MCG/ML (10ML) SYRINGE FOR IV PUSH (FOR BLOOD PRESSURE SUPPORT)
PREFILLED_SYRINGE | INTRAVENOUS | Status: AC
  Filled 2017-07-24: qty 10

## 2017-07-24 MED ORDER — SODIUM CHLORIDE 0.9 % IV SOLN: INTRAVENOUS | Status: DC

## 2017-07-24 MED ORDER — ONDANSETRON HCL 4 MG/2ML IJ SOLN: 4.0000 mg | Freq: Once | INTRAMUSCULAR | Status: DC | PRN

## 2017-07-24 MED ORDER — CEFAZOLIN SODIUM-DEXTROSE 2-4 GM/100ML-% IV SOLN
2.0000 g | INTRAVENOUS | Status: AC
  Administered 2017-07-24: 2 g via INTRAVENOUS

## 2017-07-24 MED ORDER — OXYCODONE HCL 5 MG PO TABS
5.0000 mg | ORAL_TABLET | ORAL | Status: DC | PRN
  Administered 2017-07-24 (×2): 5 mg via ORAL
  Filled 2017-07-24: qty 1

## 2017-07-24 MED ORDER — ROSUVASTATIN CALCIUM 40 MG PO TABS
40.0000 mg | ORAL_TABLET | Freq: Every day | ORAL | Status: DC
  Administered 2017-07-24: 40 mg via ORAL
  Filled 2017-07-24 (×2): qty 1
  Filled 2017-07-24: qty 4

## 2017-07-24 MED ORDER — ESMOLOL HCL 100 MG/10ML IV SOLN
INTRAVENOUS | Status: AC
  Filled 2017-07-24: qty 10

## 2017-07-24 MED ORDER — ASPIRIN EC 81 MG PO TBEC: 81.0000 mg | DELAYED_RELEASE_TABLET | Freq: Every day | ORAL | Status: DC

## 2017-07-24 MED ORDER — ACETAMINOPHEN 325 MG RE SUPP: 325.0000 mg | RECTAL | Status: DC | PRN

## 2017-07-24 MED ORDER — SUGAMMADEX SODIUM 200 MG/2ML IV SOLN
INTRAVENOUS | Status: DC | PRN
  Administered 2017-07-24: 200 mg via INTRAVENOUS

## 2017-07-24 MED ORDER — HYDROMORPHONE HCL 1 MG/ML IJ SOLN
0.2500 mg | INTRAMUSCULAR | Status: DC | PRN
  Administered 2017-07-24: 0.25 mg via INTRAVENOUS

## 2017-07-24 MED ORDER — CLOPIDOGREL BISULFATE 75 MG PO TABS
75.0000 mg | ORAL_TABLET | Freq: Every day | ORAL | Status: DC
  Administered 2017-07-25: 75 mg via ORAL
  Filled 2017-07-24: qty 1

## 2017-07-24 MED ORDER — DOCUSATE SODIUM 100 MG PO CAPS
100.0000 mg | ORAL_CAPSULE | Freq: Every day | ORAL | Status: DC
  Administered 2017-07-25: 100 mg via ORAL
  Filled 2017-07-24: qty 1

## 2017-07-24 MED ORDER — SODIUM CHLORIDE 0.9 % IV SOLN
INTRAVENOUS | Status: DC | PRN
  Administered 2017-07-24: 500 mL

## 2017-07-24 MED ORDER — PHENOL 1.4 % MT LIQD: 1.0000 | OROMUCOSAL | Status: DC | PRN

## 2017-07-24 MED ORDER — 0.9 % SODIUM CHLORIDE (POUR BTL) OPTIME
TOPICAL | Status: DC | PRN
  Administered 2017-07-24: 2000 mL

## 2017-07-24 MED ORDER — PROTAMINE SULFATE 10 MG/ML IV SOLN
INTRAVENOUS | Status: AC
  Filled 2017-07-24: qty 10

## 2017-07-24 MED ORDER — PROPOFOL 10 MG/ML IV BOLUS
INTRAVENOUS | Status: AC
  Filled 2017-07-24: qty 40

## 2017-07-24 MED ORDER — ONDANSETRON HCL 4 MG/2ML IJ SOLN: 4.0000 mg | Freq: Four times a day (QID) | INTRAMUSCULAR | Status: DC | PRN

## 2017-07-24 MED ORDER — PHENYLEPHRINE 40 MCG/ML (10ML) SYRINGE FOR IV PUSH (FOR BLOOD PRESSURE SUPPORT)
PREFILLED_SYRINGE | INTRAVENOUS | Status: DC | PRN
  Administered 2017-07-24 (×3): 40 ug via INTRAVENOUS
  Administered 2017-07-24: 80 ug via INTRAVENOUS
  Administered 2017-07-24: 40 ug via INTRAVENOUS

## 2017-07-24 MED ORDER — CEFAZOLIN SODIUM-DEXTROSE 2-4 GM/100ML-% IV SOLN
2.0000 g | Freq: Three times a day (TID) | INTRAVENOUS | Status: AC
  Administered 2017-07-24 – 2017-07-25 (×2): 2 g via INTRAVENOUS
  Filled 2017-07-24 (×2): qty 100

## 2017-07-24 SURGICAL SUPPLY — 63 items
BAG BANDED W/RUBBER/TAPE 36X54 (MISCELLANEOUS) ×2 IMPLANT
BALLN STERLING RX (BALLOONS) ×2
BALLN STERLING RX 4X30X80 (BALLOONS) ×2
BALLOON STERLING RX (BALLOONS) ×1 IMPLANT
BALLOON STERLING RX 4X30X80 (BALLOONS) ×1 IMPLANT
CANISTER SUCT 3000ML PPV (MISCELLANEOUS) ×2 IMPLANT
CANNULA VESSEL 3MM 2 BLNT TIP (CANNULA) ×2 IMPLANT
CATH ROBINSON RED A/P 18FR (CATHETERS) IMPLANT
CLIP VESOCCLUDE MED 6/CT (CLIP) ×2 IMPLANT
CLIP VESOCCLUDE SM WIDE 6/CT (CLIP) ×2 IMPLANT
COVER DOME SNAP 22 D (MISCELLANEOUS) ×2 IMPLANT
COVER PROBE W GEL 5X96 (DRAPES) ×2 IMPLANT
CRADLE DONUT ADULT HEAD (MISCELLANEOUS) ×2 IMPLANT
DECANTER SPIKE VIAL GLASS SM (MISCELLANEOUS) IMPLANT
DERMABOND ADHESIVE PROPEN (GAUZE/BANDAGES/DRESSINGS) ×1
DERMABOND ADVANCED (GAUZE/BANDAGES/DRESSINGS) ×1
DERMABOND ADVANCED .7 DNX12 (GAUZE/BANDAGES/DRESSINGS) ×1 IMPLANT
DERMABOND ADVANCED .7 DNX6 (GAUZE/BANDAGES/DRESSINGS) ×1 IMPLANT
DRAIN HEMOVAC 1/8 X 5 (WOUND CARE) IMPLANT
DRAPE INCISE IOBAN 66X45 STRL (DRAPES) ×4 IMPLANT
DRAPE UNIVERSAL PACK (DRAPES) ×2 IMPLANT
ELECT REM PT RETURN 9FT ADLT (ELECTROSURGICAL) ×2
ELECTRODE REM PT RTRN 9FT ADLT (ELECTROSURGICAL) ×1 IMPLANT
EVACUATOR SILICONE 100CC (DRAIN) IMPLANT
GLOVE BIO SURGEON STRL SZ7.5 (GLOVE) ×2 IMPLANT
GOWN STRL REUS W/ TWL LRG LVL3 (GOWN DISPOSABLE) ×3 IMPLANT
GOWN STRL REUS W/TWL LRG LVL3 (GOWN DISPOSABLE) ×3
GUIDEWIRE ENROUTE 0.014 (WIRE) ×2 IMPLANT
HEMOSTAT SPONGE AVITENE ULTRA (HEMOSTASIS) IMPLANT
KIT BASIN OR (CUSTOM PROCEDURE TRAY) ×2 IMPLANT
KIT ENCORE 26 ADVANTAGE (KITS) ×2 IMPLANT
KIT TURNOVER KIT B (KITS) ×2 IMPLANT
NEEDLE HYPO 25GX1X1/2 BEV (NEEDLE) IMPLANT
NEEDLE PERC 18GX7CM (NEEDLE) ×2 IMPLANT
NS IRRIG 1000ML POUR BTL (IV SOLUTION) ×4 IMPLANT
PACK CAROTID (CUSTOM PROCEDURE TRAY) ×2 IMPLANT
PAD ARMBOARD 7.5X6 YLW CONV (MISCELLANEOUS) ×4 IMPLANT
PROTECTION STATION PRESSURIZED (MISCELLANEOUS) ×2
SET MICROPUNCTURE 5F STIFF (MISCELLANEOUS) ×2 IMPLANT
SHEATH AVANTI 11CM 5FR (SHEATH) IMPLANT
SHUNT CAROTID BYPASS 10 (VASCULAR PRODUCTS) IMPLANT
SHUNT CAROTID BYPASS 12FRX15.5 (VASCULAR PRODUCTS) IMPLANT
STATION PROTECTION PRESSURIZED (MISCELLANEOUS) ×1 IMPLANT
STENT TRANSCAROTID SYS 7X40 (Permanent Stent) ×2 IMPLANT
STOPCOCK 4 WAY LG BORE MALE ST (IV SETS) ×2 IMPLANT
STOPCOCK MORSE 400PSI 3WAY (MISCELLANEOUS) ×2 IMPLANT
SUT ETHILON 3 0 PS 1 (SUTURE) IMPLANT
SUT PROLENE 6 0 CC (SUTURE) ×4 IMPLANT
SUT SILK 2 0SH CR/8 30 (SUTURE) ×2 IMPLANT
SUT SILK 3 0 TIES 17X18 (SUTURE)
SUT SILK 3-0 18XBRD TIE BLK (SUTURE) IMPLANT
SUT VIC AB 3-0 SH 27 (SUTURE) ×1
SUT VIC AB 3-0 SH 27X BRD (SUTURE) ×1 IMPLANT
SUT VICRYL 4-0 PS2 18IN ABS (SUTURE) ×2 IMPLANT
SYR 10ML LL (SYRINGE) ×6 IMPLANT
SYR 20CC LL (SYRINGE) ×2 IMPLANT
SYR 5ML LL (SYRINGE) ×2 IMPLANT
SYR CONTROL 10ML LL (SYRINGE) IMPLANT
TOWEL GREEN STERILE (TOWEL DISPOSABLE) ×2 IMPLANT
TUBING EXTENTION W/L.L. (IV SETS) ×2 IMPLANT
WATER STERILE IRR 1000ML POUR (IV SOLUTION) ×2 IMPLANT
WIRE AMPLATZ SS-J .035X180CM (WIRE) IMPLANT
WIRE BENTSON .035X145CM (WIRE) ×2 IMPLANT

## 2017-07-24 NOTE — Anesthesia Procedure Notes (Addendum)
Procedure Name: Intubation Date/Time: 07/24/2017 8:54 AM Performed by: Harden Mo, CRNA Pre-anesthesia Checklist: Patient identified, Emergency Drugs available, Suction available, Patient being monitored and Timeout performed Patient Re-evaluated:Patient Re-evaluated prior to induction Oxygen Delivery Method: Circle System Utilized Preoxygenation: Pre-oxygenation with 100% oxygen Induction Type: IV induction Ventilation: Mask ventilation without difficulty Laryngoscope Size: Miller and 2 Grade View: Grade I Tube type: Oral Tube size: 7.0 mm Number of attempts: 1 Airway Equipment and Method: Stylet and Oral airway Placement Confirmation: ETT inserted through vocal cords under direct vision,  positive ETCO2 and breath sounds checked- equal and bilateral Secured at: 21 cm Tube secured with: Tape Dental Injury: Teeth and Oropharynx as per pre-operative assessment  Comments: Intubation by Carver Fila, SRNA

## 2017-07-24 NOTE — Progress Notes (Signed)
Patient arrived from PACU in NAD. VS stable and patient free from pain.

## 2017-07-24 NOTE — Transfer of Care (Signed)
Immediate Anesthesia Transfer of Care Note  Patient: Sandra Shelton  Procedure(s) Performed: TRANSCAROTID ARTERY REVASCULARIZATION, left (Left Neck)  Patient Location: PACU  Anesthesia Type:General  Level of Consciousness: awake and alert   Airway & Oxygen Therapy: Patient Spontanous Breathing and Patient connected to face mask oxygen  Post-op Assessment: Report given to RN, Post -op Vital signs reviewed and stable, Patient moving all extremities X 4 and Patient able to stick tongue midline  Post vital signs: Reviewed and stable  Last Vitals:  Vitals Value Taken Time  BP 121/57 07/24/2017 10:58 AM  Temp    Pulse 86 07/24/2017 10:58 AM  Resp 13 07/24/2017 10:58 AM  SpO2 100 % 07/24/2017 10:58 AM  Vitals shown include unvalidated device data.  Last Pain:  Vitals:   07/24/17 0655  PainSc: 0-No pain      Patients Stated Pain Goal: 3 (54/00/86 7619)  Complications: No apparent anesthesia complications

## 2017-07-24 NOTE — Progress Notes (Signed)
Awake alert no neck or groin hematoma Neuro no UE/LE weakness swallow intact tongue midline  Vitals:   07/24/17 1415 07/24/17 1430 07/24/17 1500 07/24/17 1515  BP: (!) 125/53 (!) 124/53 119/63 (!) 120/58  Pulse: 66 71 71 69  Resp: 11 14 13 13   Temp:      SpO2: 100% 100% 100% 100%   Stable post TCAR left  D/c home am if no problems overnight  [name], MD [facility] Office: [phone] Pager: [phone]

## 2017-07-24 NOTE — Anesthesia Procedure Notes (Signed)
Arterial Line Insertion Start/End7/10/2017 8:29 AM, 07/24/2017 8:31 AM Performed by: Lillia Abed, MD, anesthesiologist  Preanesthetic checklist: patient identified, IV checked, risks and benefits discussed, surgical consent, monitors and equipment checked, pre-op evaluation, timeout performed and anesthesia consent Lidocaine 1% used for infiltration Right, radial was placed Catheter size: 20 G Hand hygiene performed  and Seldinger technique used  Attempts: 2 Procedure performed without using ultrasound guided technique. Following insertion, dressing applied and Biopatch. Post procedure assessment: normal  Patient tolerated the procedure well with no immediate complications.

## 2017-07-24 NOTE — Anesthesia Postprocedure Evaluation (Signed)
Anesthesia Post Note  Patient: Sandra Shelton  Procedure(s) Performed: TRANSCAROTID ARTERY REVASCULARIZATION, left (Left Neck)     Patient location during evaluation: PACU Anesthesia Type: General Level of consciousness: awake and alert Pain management: pain level controlled Vital Signs Assessment: post-procedure vital signs reviewed and stable Respiratory status: spontaneous breathing, nonlabored ventilation, respiratory function stable and patient connected to nasal cannula oxygen Cardiovascular status: blood pressure returned to baseline and stable Postop Assessment: no apparent nausea or vomiting Anesthetic complications: no    Last Vitals:  Vitals:   07/24/17 1615 07/24/17 1630  BP: (!) 118/57 115/61  Pulse: 81 74  Resp: 17 20  Temp:    SpO2: 100% 100%    Last Pain:  Vitals:   07/24/17 1400  PainSc: [place]                 [name] DAVID

## 2017-07-24 NOTE — Anesthesia Preprocedure Evaluation (Addendum)
Anesthesia Evaluation  Patient identified by MRN, date of birth, ID band Patient awake    Reviewed: Allergy & Precautions, NPO status , Patient's Chart, lab work & pertinent test results  Airway Mallampati: II  TM Distance: >3 FB Neck ROM: Full    Dental  (+) Teeth Intact, Dental Advisory Given   Pulmonary    Pulmonary exam normal        Cardiovascular hypertension, Pt. on medications Normal cardiovascular exam  06/2017 Study Conclusions  - Left ventricle: The cavity size was normal. There was moderate   septal hypertropy with otherwise mild concentric hypertrophy.   Systolic function was normal. The estimated ejection fraction was   in the range of 60% to 65%. There was dynamic obstruction at   rest, with a peak velocity of 167 cm/sec and a peak gradient of   11 mm Hg. Wall motion was normal; there were no regional wall   motion abnormalities. Doppler parameters are consistent with   abnormal left ventricular relaxation (grade 1 diastolic   dysfunction). - Aortic valve: Transvalvular velocity was within the normal range.   There was no stenosis. There was no regurgitation. - Mitral valve: Transvalvular velocity was within the normal range.   There was no evidence for stenosis. There was no regurgitation. - Right ventricle: The cavity size was normal. Wall thickness was   normal. Systolic function was normal. - Tricuspid valve: There was no regurgitation.   Neuro/Psych CVA    GI/Hepatic GERD  Medicated and Controlled,  Endo/Other  diabetes, Type 2, Oral Hypoglycemic Agents  Renal/GU      Musculoskeletal   Abdominal   Peds  Hematology   Anesthesia Other Findings   Reproductive/Obstetrics                           Anesthesia Physical Anesthesia Plan  ASA: III  Anesthesia Plan: General   Post-op Pain Management:    Induction: Intravenous  PONV Risk Score and Plan: 3 and  Ondansetron, Midazolam and Treatment may vary due to age or medical condition  Airway Management Planned: Oral ETT  Additional Equipment: Arterial line  Intra-op Plan:   Post-operative Plan: Extubation in OR  Informed Consent: I have reviewed the patients History and Physical, chart, labs and discussed the procedure including the risks, benefits and alternatives for the proposed anesthesia with the patient or authorized representative who has indicated his/her understanding and acceptance.     Plan Discussed with: CRNA and Surgeon  Anesthesia Plan Comments:         Anesthesia Quick Evaluation

## 2017-07-24 NOTE — Telephone Encounter (Signed)
sch appt phone NA  08/14/17 11am Dialysis Duplex 08/15/17  330pm p/o MD

## 2017-07-24 NOTE — Interval H&P Note (Signed)
History and Physical Interval Note:  07/24/2017 7:58 AM  Sandra Shelton  has presented today for surgery, with the diagnosis of left carotid stenosis  The various methods of treatment have been discussed with the patient and family. After consideration of risks, benefits and other options for treatment, the patient has consented to  Procedure(s): TRANSCAROTID ARTERY REVASCULARIZATION (Left) as a surgical intervention .  The patient's history has been reviewed, patient examined, no change in status, stable for surgery.  I have reviewed the patient's chart and labs.  Questions were answered to the patient's satisfaction.     [name]

## 2017-07-24 NOTE — Discharge Summary (Signed)
Discharge Summary     Sandra Shelton 10/29/1955 62 y.o. female  [id number]  Admission Date: 07/24/2017  Discharge Date: 07/25/17  Physician: Elam Dutch, MD  Admission Diagnosis: left carotid stenosis   HPI:   This is a 62 y.o. female who was admitted on 07/05/17 after she was found obtunded at home.  She was brought to the hospital by EMS in DKA.  Her glucose in the ER was greater than 1200.  During her hospitalization, she was found to have right sided weakness.  She states she has never had this in the past but it was present earlier in this admission.  She doesn't feel like she has had much weakness in her right leg and feels her right hand is back at baseline.  She denies any clumsiness or numbness.  She denies ever having any speech difficulties or visual problems.    Neurology was consulted.   CT of head did show positive for subacute left frontal and left caudate infarcts.  She has not had a stroke prior to this.  A carotid duplex was obtained and revealed 1-39% stenosis bilaterally, but was technically difficult.  MRA of the brain suggested diminished flow and caliber throughout the left carotid suggesting high grade proximal stenosis.  VVS is consulted to evaluate pt.    The pt is on a statin for cholesterol management.  She takes a daily aspirin.  She has been placed on Plavix during this admission.  Prior to admission, she was on oral agents for diabetes.  She is on clonidine and Dyazide for blood pressure management.   She denies hx of chest pain, MI, hx DVT or claudication sx.  She had surgery for a uterine fibroid in the mid 2000's.   She has never smoked.  She works to help homeless veterans find housing.  She is from [place] and has lived here for 5 years.  Her mom is deceased but had a hx of stroke.  Her father is living at age 59 and has hx of cancer.   Hospital Course:  The patient was admitted to the hospital and taken to the operating room on 07/24/2017  and underwent Trans carotid artery revascularization (TCAR) left side (7 x 40).    Operative findings: #1 90% left internal carotid artery stenosis stented to residual  0% stenosis (7 x 40 mm ENROUTE stent); #2 right femoral venous access  The pt tolerated the procedure well and was transported to the PACU in good condition.   By POD 1, the pt neuro status was in tact.  She did have hypokalemia that was supplemented.  She is continued on plavix/aspirin.  She will f/u in 2-3 weeks with duplex for new baseline.  The remainder of the hospital course consisted of increasing mobilization and increasing intake of solids without difficulty.   Recent Labs    07/23/17 1353 07/25/17 0409  NA 139 139  K 3.8 3.0*  CL 105 102  CO2 20* 27  GLUCOSE 167* 106*  BUN 9 <5*  CALCIUM 10.0 8.7*   Recent Labs    07/23/17 1353 07/25/17 0409  WBC 7.1 6.6  HGB 12.0 10.1*  HCT 37.4 31.6*  PLT 455* 362   Recent Labs    07/23/17 1353  INR 0.98     Discharge Instructions    Discharge patient   Complete by:  As directed    Discharge after breakfast and when pt is voiding well   Discharge disposition:  01-Home or Self  Care   Discharge patient date:  07/25/2017      Discharge Diagnosis:  left carotid stenosis  Secondary Diagnosis: Patient Active Problem List   Diagnosis Date Noted  . Embolic stroke involving left carotid artery ([place]) 07/24/2017  . Cerebral thrombosis with cerebral infarction 07/08/2017  . DKA, type 2 ([place]) 07/06/2017  . DKA (diabetic ketoacidoses) ([place]) 07/06/2017  . Metabolic acidosis   . Acute renal failure ([place])   . Dehydration    Past Medical History:  Diagnosis Date  . Diabetes mellitus without complication ([place])    type 2  . Embolic stroke involving carotid artery ([place])   . GERD (gastroesophageal reflux disease)   . Heart murmur    no problems with it  . Hepatitis    in the 80's, took meds at the time   . Hypertension   . Peripheral vascular disease ([facility])      carotid artery blockage   . Stroke ([place]) 07/12/2017    Allergies as of 07/25/2017   No Known Allergies     Medication List    STOP taking these medications   insulin aspart protamine- aspart (70-30) 100 UNIT/ML injection Commonly known as:  NOVOLOG MIX 70/30     TAKE these medications   amLODipine 10 MG tablet Commonly known as:  NORVASC Take 10 mg by mouth daily.   aspirin 81 MG tablet Take 1 tablet (81 mg total) by mouth daily.   blood glucose meter kit and supplies Kit Dispense based on patient and insurance preference. Use up to four times daily as directed. (FOR ICD-9 250.00, 250.01).   clopidogrel 75 MG tablet Commonly known as:  PLAVIX Take 1 tablet (75 mg total) by mouth daily.   INSULIN SYRINGE .5CC/29G 29G X 1/2" 0.5 ML Misc For insulin injection   LANTUS SOLOSTAR 100 UNIT/ML Solostar Pen Generic drug:  Insulin Glargine Inject 10 Units into the skin 2 (two) times daily.   lisinopril 2.5 MG tablet Commonly known as:  PRINIVIL,ZESTRIL Take 1 tablet (2.5 mg total) by mouth daily.   metFORMIN 500 MG tablet Commonly known as:  GLUCOPHAGE Take 1 tablet (500 mg total) by mouth 2 (two) times daily with a meal. What changed:  how much to take   oxyCODONE-acetaminophen 5-325 MG tablet Commonly known as:  PERCOCET Take 1 tablet by mouth every 6 (six) hours as needed for severe pain.   potassium chloride 10 MEQ tablet Commonly known as:  K-DUR Take 1 tablet (10 mEq total) by mouth daily.   rosuvastatin 40 MG tablet Commonly known as:  CRESTOR Take 1 tablet (40 mg total) by mouth daily at 6 PM.   SYRINGE 3CC/27GX1-1/4" 27G X 1-1/4" 3 ML Misc For insulin injection        [facility] Discharge Instructions Carotid Endarterectomy (CEA)  Please refer to the following instructions for your post-procedure care. Your surgeon or physician assistant will discuss any changes with you.  Activity  You are encouraged to walk as  much as you can. You can slowly return to normal activities but must avoid strenuous activity and heavy lifting until your doctor tell you it's OK. Avoid activities such as vacuuming or swinging a golf club. You can drive after one week if you are comfortable and you are no longer taking prescription pain medications. It is normal to feel tired for serval weeks after your surgery. It is also normal to have difficulty with sleep habits, eating, and bowel movements after surgery. These  will go away with time.  Bathing/Showering  You may shower after you come home. Do not soak in a bathtub, hot tub, or swim until the incision heals completely.  Incision Care  Shower every day. Clean your incision with mild soap and water. Pat the area dry with a clean towel. You do not need a bandage unless otherwise instructed. Do not apply any ointments or creams to your incision. You may have skin glue on your incision. Do not peel it off. It will come off on its own in about one week. Your incision may feel thickened and raised for several weeks after your surgery. This is normal and the skin will soften over time. For Men Only: It's OK to shave around the incision but do not shave the incision itself for 2 weeks. It is common to have numbness under your chin that could last for several months.  Diet  Resume your normal diet. There are no special food restrictions following this procedure. A low fat/low cholesterol diet is recommended for all patients with vascular disease. In order to heal from your surgery, it is CRITICAL to get adequate nutrition. Your body requires vitamins, minerals, and protein. Vegetables are the best source of vitamins and minerals. Vegetables also provide the perfect balance of protein. Processed food has little nutritional value, so try to avoid this.  Medications  Resume taking all of your medications unless your doctor or physician assistant tells you not to.  If your incision is causing  pain, you may take over-the- counter pain relievers such as acetaminophen (Tylenol). If you were prescribed a stronger pain medication, please be aware these medications can cause nausea and constipation.  Prevent nausea by taking the medication with a snack or meal. Avoid constipation by drinking plenty of fluids and eating foods with a high amount of fiber, such as fruits, vegetables, and grains. Do not take Tylenol if you are taking prescription pain medications.  Follow Up  Our office will schedule a follow up appointment 2-3 weeks following discharge.  Please call us immediately for any of the following conditions  . Increased pain, redness, drainage (pus) from your incision site. . Fever of 101 degrees or higher. . If you should develop stroke (slurred speech, difficulty swallowing, weakness on one side of your body, loss of vision) you should call 911 and go to the nearest emergency room. .  Reduce your risk of vascular disease:  . Stop smoking. If you would like help call [facility] at 1-800-QUIT-NOW 814-245-4052) or [place] at 5741714032. . Manage your cholesterol . Maintain a desired weight . Control your diabetes . Keep your blood pressure down .  If you have any questions, please call the office at [phone].  Prescriptions given: Roxicet #8 No Refill  Disposition: home  Patient's condition: is Good  Follow up: 1. Dr. Oneida Alar in 2-3 weeks with carotid duplex.   Leontine Locket, PA-C [facility] [phone]   --- For [facility] use ---   Modified Rankin score at D/C (0-6): 0  IV medication needed for:  1. Hypertension: No 2. Hypotension: No  Post-op Complications: No  1. Post-op CVA or TIA: No  If yes: Event classification (right eye, left eye, right cortical, left cortical, verterobasilar, other): n/a  If yes: Timing of event (intra-op, <6 hrs post-op, >=6 hrs post-op, unknown): n/a  2. CN injury: No  If yes: CN n/a  injuried   3. Myocardial infarction: No  If yes: Dx by (EKG  or clinical, Troponin): n/a  4.  CHF: No  5.  Dysrhythmia (new): No  6. Wound infection: No  7. Reperfusion symptoms: No  8. Return to OR: No  If yes: return to OR for (bleeding, neurologic, other CEA incision, other): n/a  Discharge medications: Statin use:  Yes ASA use:  Yes   Beta blocker use:  No ACE-Inhibitor use:  Yes  ARB use:  No CCB use: Yes P2Y12 Antagonist use: Yes, [x ] Plavix, [ ]  Plasugrel, [ ]  Ticlopinine, [ ]  Ticagrelor, [ ]  Other, [ ]  No for medical reason, [ ]  Non-compliant, [ ]  Not-indicated Anti-coagulant use:  No, [ ]  Warfarin, [ ]  Rivaroxaban, [ ]  Dabigatran,

## 2017-07-24 NOTE — Op Note (Signed)
Procedure: Trans carotid artery revascularization (TCAR) left side (7 x 40)  Preoperative diagnosis: Symptomatic left internal carotid artery stenosis  Postoperative diagnosis: Same  Anesthesia: Gen.  Assistant: Servando Snare M.D.  Indications: Patient is an 62 year old female who recently sustained a left brain TIA with right arm weakness.  She has a high carotid lesion anatomically  Operative findings: #1 90% left internal carotid artery stenosis stented to residual  0% stenosis (7 x 40 mm ENROUTE stent)  #2 right femoral venous access  Operative details: After obtaining informed consent, the patient was taken the operating room. The patient was placed in supine position upper table. After induction of general anesthesia and endotracheal intubation, Foley catheter was placed. At this point the entire left neck and chest were prepped and draped in usual sterile fashion. The right groin was also prepped and draped in usual sterile fashion. A time out was performed. Next a oblique incision was made between the heads of the sternocleidomastoid muscle on the left side of the neck. Incision was carried down through the platysma to the level of the left internal jugular vein. This was reflected laterally. The vagus nerve was identified and protected. Common carotid artery was dissected free circumferentially at the base of the incision. This was elevated up in the operative field with an umbilical tape and a vessel loop. Approximately 2 cm of the artery was dissected free circumferentially to allow adequate exposure. A pursestring suture was placed in the artery at the area we were planning to puncture using a running 6-0 Prolene suture.At this point femoral venous access was established via the right groin. Ultrasound was used to identify the right common femoral vein. A micropuncture needle was used to cannulate the right common femoral vein and a micropuncture wire advanced into the vein and the  micropuncture sheath placed over this. An 32 Bentsen wire was advanced up into the right femoral venous system. The sheath for the flow reversal system was placed into the right femoral system and thoroughly flushed with heparinized saline. The patient was given 8,000 units of total heparin to establish an ACT greater than 250. At this point a micropuncture needle was used to cannulate the left common carotid artery. The micropuncture wire was advanced just below the level of the carotid bifurcation. The micropuncture sheath was then advanced over this about 3 cm into the common carotid artery. This was thoroughly flushed with heparinized saline.a contrast angiogram was then performed in AP projection to confirm that we were truly intraluminal with the sheath. This was also used to determine the level of the carotid bifurcation. The carotid Amplatz wire was then placed through the micropuncture sheath and the sheath removed. The 8 French flow reversal arterial sheath was then advanced over the guidewire and into the common carotid artery. Sheath was sutured to the skin with 3 2-0 silk sutures.Contrast angiogram was again performed to make sure that we were within the true lumen. This was done in 2 views.  The filter device was switched to the low-flow selection. The flow reversal system was then hooked up to the arterial end of the sheath after everything had been thoroughly flushed and de-aired. Passive flow was used to fill the arterial end of the filter and through the filter and up to the level of the venous sheath. The venous sheath was also fully de-aired and passive flow was established. This was checked by saline infusion in the venous port and noted to flush easily. High flow was then  turned on on the filter device. At this point a TCAR timeout was performed to make sure that the patient's blood pressure was reasonable. It was systolic 858 at this point. We again confirmed that the ACT was above 250. The  balloon and guidewire insufflator and stent were all selected. These were all prepped. A 3 x 30 mm angioplasty balloon had been selected based on the preoperative CT. Based on the angiogram intraoperatively as well as preoperative CT and 7 x 40 mm Enroute stent was selected. The 014 wire was slightly shaped opening and due to slight curvature of the internal carotid artery. This was advanced with the balloon over it into the common carotid artery and a guidewire used to selectively catheterize the left internal carotid artery. The lesion was fairly tight about 90% and the distal internal fairly small.  We confirmed that we were within the internal carotid artery by first establishing that the guidewire advanced into the petrous portion of the internal carotid artery and also with contrast angiogram. At this point the 3 x 30 balloon was centered on the lesion and inflated to 6 atm slowly inflating and deflating the balloon. The patient had been given a dose of robinal to establish and maintain her blood pressure and heart rate. After predilatation a contrast angiogram was again performed which showed some improvement in the stenosis. We then brought up the 7 x 40 Enroute stent and advanced and centered this on the lesion.  This was then deployed using a pinch technique after opening the Tuohy Borst valve.  We then gave the patient 2 minutes of additional flow reversal before performing a completion angiogram.  Completion angiogram showed a residual 40% waist.  So a post dilation was performed with the 5 mm balloon centered on the lesion. An AP and lateral angiogram were performed to confirm that the stent was fully deployed after post dilation. At this point the guidewire was removed. The flow reversal system was clamped off at the arterial sheath and disconnected. The remaining blood was returned to the patient. This was then disconnected from the venous sheath. The arterial sheath was removed and the pursestring  suture cinched down. The patient was given 80 mg of protamine to achieve hemostasis. The venous sheath was pulled and hemostasis obtained with direct pressure. The carotid was inspected found to be without any area of hematoma or active bleeding. Pt was given 80 mg of protamine. The platysma muscle was reapproximated using a running 3-0 Vicryl suture. The skin was closed with a 4-0 Vicryl subcuticular stitch. Dermabond was applied to the incision. Patient was awakened in the operating room and moving her upper and lower extremities symmetrically. She was taken to the recovery room in stable condition.  [name], MD [facility] [place] Office: [phone] Pager: [phone]

## 2017-07-24 NOTE — Discharge Instructions (Signed)
° °  [facility] [place] ° °Discharge Instructions °  °Carotid Endarterectomy (CEA) ° °Please refer to the following instructions for your post-procedure care. Your surgeon or physician assistant will discuss any changes with you. ° °Activity ° °You are encouraged to walk as much as you can. You can slowly return to normal activities but must avoid strenuous activity and heavy lifting until your doctor tell you it's okay. Avoid activities such as vacuuming or swinging a golf club. You can drive after one week if you are comfortable and you are no longer taking prescription pain medications. It is normal to feel tired for serval weeks after your surgery. It is also normal to have difficulty with sleep habits, eating, and bowel movements after surgery. These will go away with time. ° °Bathing/Showering ° °Shower daily after you go home. Do not soak in a bathtub, hot tub, or swim until the incision heals completely. ° °Incision Care ° °Shower every day. Clean your incision with mild soap and water. Pat the area dry with a clean towel. You do not need a bandage unless otherwise instructed. Do not apply any ointments or creams to your incision. You may have skin glue on your incision. Do not peel it off. It will come off on its own in about one week. Your incision may feel thickened and raised for several weeks after your surgery. This is normal and the skin will soften over time.  ° °For Men Only: It's okay to shave around the incision but do not shave the incision itself for 2 weeks. It is common to have numbness under your chin that could last for several months. ° °Diet ° °Resume your normal diet. There are no special food restrictions following this procedure. A low fat/low cholesterol diet is recommended for all patients with vascular disease. In order to heal from your surgery, it is CRITICAL to get adequate nutrition. Your body requires vitamins, minerals, and protein. Vegetables are the  best source of vitamins and minerals. Vegetables also provide the perfect balance of protein. Processed food has little nutritional value, so try to avoid this. ° °Medications ° °Resume taking all of your medications unless your doctor or physician assistant tells you not to. If your incision is causing pain, you may take over-the- counter pain relievers such as acetaminophen (Tylenol). If you were prescribed a stronger pain medication, please be aware these medications can cause nausea and constipation. Prevent nausea by taking the medication with a snack or meal. Avoid constipation by drinking plenty of fluids and eating foods with a high amount of fiber, such as fruits, vegetables, and grains.  °Do not take Tylenol if you are taking prescription pain medications. ° °Follow Up ° °Our office will schedule a follow up appointment 2-3 weeks following discharge. ° °Please call us immediately for any of the following conditions ° °Increased pain, redness, drainage (pus) from your incision site. °Fever of 101 degrees or higher. °If you should develop stroke (slurred speech, difficulty swallowing, weakness on one side of your body, loss of vision) you should call 911 and go to the nearest emergency room. ° °Reduce your risk of vascular disease: ° °Stop smoking. If you would like help call [facility] at 1-800-QUIT-NOW (1-800-784-8669) or [place] at [phone]. °Manage your cholesterol °Maintain a desired weight °Control your diabetes °Keep your blood pressure down ° °If you have any questions, please call the office at [phone]. ° °

## 2017-07-24 NOTE — Anesthesia Procedure Notes (Deleted)
Arterial Line Insertion Start/End7/10/2017 8:05 AM, 07/24/2017 8:31 AM Performed by: Lillia Abed, MD, attending  Preanesthetic checklist: patient identified, IV checked, site marked, risks and benefits discussed, surgical consent, monitors and equipment checked, pre-op evaluation and anesthesia consent Lidocaine 1% used for infiltration Right, radial was placed Catheter size: 20 G Hand hygiene performed  and maximum sterile barriers used   Attempts: 3 Procedure performed without using ultrasound guided technique. Ultrasound Notes:anatomy identified, needle tip was noted to be adjacent to the nerve/plexus identified and no ultrasound evidence of intravascular and/or intraneural injection Following insertion, dressing applied and Biopatch. Post procedure assessment: normal  Patient tolerated the procedure well with no immediate complications.

## 2017-07-25 ENCOUNTER — Encounter (HOSPITAL_COMMUNITY): Payer: Self-pay | Admitting: Vascular Surgery

## 2017-07-25 LAB — BASIC METABOLIC PANEL
ANION GAP: 10 (ref 5–15)
BUN: <5 mg/dL — ABNORMAL LOW (ref 8–23)
CALCIUM: 8.7 mg/dL — AB (ref 8.9–10.3)
CO2: 27 mmol/L (ref 22–32)
Chloride: 102 mmol/L (ref 98–111)
Creatinine, Ser: 0.69 mg/dL (ref 0.44–1.00)
GLUCOSE: 106 mg/dL — AB (ref 70–99)
POTASSIUM: 3 mmol/L — AB (ref 3.5–5.1)
Sodium: 139 mmol/L (ref 135–145)

## 2017-07-25 LAB — CBC
HEMATOCRIT: 31.6 % — AB (ref 36.0–46.0)
Hemoglobin: 10.1 g/dL — ABNORMAL LOW (ref 12.0–15.0)
MCH: 28.1 pg (ref 26.0–34.0)
MCHC: 32 g/dL (ref 30.0–36.0)
MCV: 88 fL (ref 78.0–100.0)
PLATELETS: 362 10*3/uL (ref 150–400)
RBC: 3.59 MIL/uL — AB (ref 3.87–5.11)
RDW: 12.5 % (ref 11.5–15.5)
WBC: 6.6 10*3/uL (ref 4.0–10.5)

## 2017-07-25 LAB — GLUCOSE, CAPILLARY
GLUCOSE-CAPILLARY: 127 mg/dL — AB (ref 70–99)
Glucose-Capillary: 104 mg/dL — ABNORMAL HIGH (ref 70–99)

## 2017-07-25 LAB — POCT ACTIVATED CLOTTING TIME: Activated Clotting Time: 252 seconds

## 2017-07-25 MED ORDER — POTASSIUM CHLORIDE CRYS ER 20 MEQ PO TBCR
40.0000 meq | EXTENDED_RELEASE_TABLET | Freq: Once | ORAL | Status: AC
  Administered 2017-07-25: 40 meq via ORAL
  Filled 2017-07-25: qty 2

## 2017-07-25 MED ORDER — OXYCODONE-ACETAMINOPHEN 5-325 MG PO TABS: 1.0000 | ORAL_TABLET | Freq: Four times a day (QID) | ORAL | 0 refills | Status: DC | PRN

## 2017-07-25 NOTE — Progress Notes (Addendum)
  Progress Note    07/25/2017 7:20 AM 1 Day Post-Op  Subjective:  Ready to go home; swallowing ok; has been out of bed  Afebrile HR 60's-90's 099'I-338'S systolic 50% RA  Vitals:   07/25/17 0012 07/25/17 0340  BP:  (!) 109/49  Pulse:  67  Resp: 18 18  Temp: 98.7 F (37.1 C) 98.4 F (36.9 C)  SpO2: 99% 98%     Physical Exam: Neuro:  In tact; tongue midline Lungs:  Non labored Incision:  Neck incision is clean and dry; right groin clean without hematoma  CBC    Component Value Date/Time   WBC 6.6 07/25/2017 0409   RBC 3.59 (L) 07/25/2017 0409   HGB 10.1 (L) 07/25/2017 0409   HCT 31.6 (L) 07/25/2017 0409   PLT 362 07/25/2017 0409   MCV 88.0 07/25/2017 0409   MCH 28.1 07/25/2017 0409   MCHC 32.0 07/25/2017 0409   RDW 12.5 07/25/2017 0409   LYMPHSABS 1.0 07/05/2017 2242   MONOABS 0.4 07/05/2017 2242   EOSABS 0.0 07/05/2017 2242   BASOSABS 0.1 07/05/2017 2242    BMET    Component Value Date/Time   NA 139 07/25/2017 0409   K 3.0 (L) 07/25/2017 0409   CL 102 07/25/2017 0409   CO2 27 07/25/2017 0409   GLUCOSE 106 (H) 07/25/2017 0409   BUN <5 (L) 07/25/2017 0409   CREATININE 0.69 07/25/2017 0409   CALCIUM 8.7 (L) 07/25/2017 0409   GFRNONAA >60 07/25/2017 0409   GFRAA >60 07/25/2017 0409     Intake/Output Summary (Last 24 hours) at 07/25/2017 0720 Last data filed at 07/25/2017 0400 Gross per 24 hour  Intake 2405.12 ml  Output 1000 ml  Net 1405.12 ml     Assessment/Plan:  This is a 62 y.o. female who is s/p TCAR 1 Day Post-Op  -pt is doing well this am. -pt neuro exam is in tact -hypokalemia-supplement this am -pt has ambulated -f/u with Dr. Oneida Alar in 2-3 weeks with duplex -continue plavix/aspirin   Leontine Locket, PA-C [facility] [phone]  Agree with above.  D/c home duplex in a few weeks for new baseline Plavix ASA statin  [name], MD [facility] [place]: [phone] Pager:  [phone]

## 2017-07-25 NOTE — Care Management Note (Signed)
Case Management Note Sandra Gibbons RN, BSN Unit 4E-Case Manager [phone]  Patient Details  Name: Sandra Shelton MRN: [id number] Date of Birth: 24-Jul-1955  Subjective/Objective:    Pt admitted s/p CEA                Action/Plan: PTA pt was living at home with sister, on last admission was approved for charity care with [facility] for [facility] services- active for HHRN/PT/SW services- will resume services on discharge- Sandra Shelton with [facility] aware of admission and discharge for today. Pt has PCP-Sandra Shelton and was assisted with medications on last admit through [facility]- not eligible for [facility] again at this time. No further CM needs noted for transition back home.   Expected Discharge Date:  07/25/17               Expected Discharge Plan:  [place]  In-House Referral:  NA  Discharge planning Services  CM Consult  Post Acute Care Choice:  Home Health, Resumption of Svcs/PTA Provider Choice offered to:  Patient  DME Arranged:    DME Agency:     HH Arranged:  RN, PT, Social Work [employer] Agency:  [place]  Status of Service:  Completed, signed off  If discussed at [employer], dates discussed:    Additional Comments:  Sandra Patricia, RN 07/25/2017, 11:58 AM

## 2017-07-25 NOTE — Progress Notes (Signed)
D/c instructions given to pt and family including wound care. Prescription given. Telemetry removed. IV removed, clean and intact. Sister to escort home.  Clyde Canterbury, RN

## 2017-07-26 ENCOUNTER — Other Ambulatory Visit: Payer: Self-pay

## 2017-07-26 DIAGNOSIS — Z48812 Encounter for surgical aftercare following surgery on the circulatory system: Secondary | ICD-10-CM

## 2017-07-26 DIAGNOSIS — I6330 Cerebral infarction due to thrombosis of unspecified cerebral artery: Secondary | ICD-10-CM

## 2017-07-29 ENCOUNTER — Other Ambulatory Visit: Payer: Self-pay

## 2017-07-29 NOTE — Patient Outreach (Signed)
[facility]) Care Management  07/29/2017  Naina Sleeper 03/12/1955 [id number]  EMMI: stroke alert Referral date: 07/29/17 Referral reason: went to follow up: no  Day # 13  Telephone call to patient regarding EMMI stroke red alert. Contact answering phone states patient is not available. HIPAA compliant message left with call back phone number. Marland Kitchen   PLAN: RNCM will attempt 2nd telephone call to patient within 4 business days.   Quinn Plowman RN,BSN,CCM [facility] Telephonic  [phone]

## 2017-07-30 ENCOUNTER — Other Ambulatory Visit: Payer: Self-pay

## 2017-07-30 NOTE — Patient Outreach (Signed)
[facility]) Care Management  07/30/2017  Debar Plate 18-Jun-1955 [id number]  EMMI: stroke RED  alert Referral date: 07/29/17 Referral reason: went to follow up: no  Day # 13  Telephone call to patient regarding EMMI stroke red alert. HIPAA verified with patient. Explained reason for call.  Patient states she saw her primary care provider approximately 1 1/2 to 2 weeks after being discharged from the hospital. Patient states she is scheduled to see the neurologist and vascular doctor in August 2019.  Patient reports she has transportation to her appointments. She states she has her medications and is taking them as prescribed.  [facility] report [facility] will be starting therapy visits with her today.  RNCM discussed sign/symptoms of stroke with patient. Advised patient that 911 should be called for stroke like symptoms. Advisd patient to call her doctor for non emergent symptoms/ concerns.  RNCM informed patient she would continue to get automated  EMMI-stroke  post discharge calls to assess how she is doing following her recent hospitalization and will receive a call from a nurse if any of her responses were abnormal. Patient voiced understanding.  PLAN; RNCM will close patient due to patient being assessed and having no further needs.   Quinn Plowman RN,BSN,CCM [facility] Telephonic  [phone]

## 2017-08-14 ENCOUNTER — Ambulatory Visit (HOSPITAL_COMMUNITY)
Admission: RE | Admit: 2017-08-14 | Discharge: 2017-08-14 | Disposition: A | Discharge status: Home / Self Care | Payer: Medicaid Other | Source: Ambulatory Visit | Attending: Vascular Surgery | Admitting: Vascular Surgery

## 2017-08-14 DIAGNOSIS — I6330 Cerebral infarction due to thrombosis of unspecified cerebral artery: Secondary | ICD-10-CM | POA: Diagnosis not present

## 2017-08-14 DIAGNOSIS — Z48812 Encounter for surgical aftercare following surgery on the circulatory system: Secondary | ICD-10-CM | POA: Diagnosis not present

## 2017-08-14 DIAGNOSIS — I6521 Occlusion and stenosis of right carotid artery: Secondary | ICD-10-CM | POA: Diagnosis not present

## 2017-08-15 ENCOUNTER — Ambulatory Visit (INDEPENDENT_AMBULATORY_CARE_PROVIDER_SITE_OTHER): Payer: Self-pay | Admitting: Vascular Surgery

## 2017-08-15 ENCOUNTER — Other Ambulatory Visit: Payer: Self-pay

## 2017-08-15 ENCOUNTER — Encounter: Payer: Self-pay | Admitting: Vascular Surgery

## 2017-08-15 VITALS — BP 140/76 | HR 84 | Temp 98.4°F | Resp 14 | Ht 65.0 in | Wt 167.0 lb

## 2017-08-15 DIAGNOSIS — I6522 Occlusion and stenosis of left carotid artery: Secondary | ICD-10-CM

## 2017-08-15 NOTE — Progress Notes (Signed)
Vitals:   08/15/17 1452 08/15/17 1457  BP: (!) 150/75 139/74  Pulse: 83 84  Resp: 14   Temp: 98.4 F (36.9 C)   TempSrc: Oral   SpO2: 100%   Weight: 167 lb (75.8 kg)   Height: 5\' 5"  (1.651 m)

## 2017-08-15 NOTE — Progress Notes (Signed)
Patient is a 62 year old female who returns today after recent TCAR left carotid stenting for symptomatic carotid stenosis.  The patient had had a left brain event prior to her stenting procedure.  She still has some clumsiness on her right upper extremity and right leg.  She is still recovering.  She denies any incisional drainage.  She has been compliant with her Plavix and aspirin and statin.  Physical exam:  Vitals:   08/15/17 1452 08/15/17 1457 08/15/17 1458  BP: (!) 150/75 139/74 140/76  Pulse: 83 84 84  Resp: 14    Temp: 98.4 F (36.9 C)    TempSrc: Oral    SpO2: 100%    Weight: 167 lb (75.8 kg)    Height: 5\' 5"  (1.651 m)      Neck: Well-healed base of left neck incision, no carotid bruit  Neuro: Symmetric upper extremity and lower extremity motor strength 5/5 but some subtle clumsiness of the right arm and right leg.  She also has slightly differential appearance of her right eye versus left eye with the left eye bulging slightly more which the patient says is also new since her cerebral event.  Data: Patient had a carotid duplex exam yesterday which showed widely patent left carotid stent minimal stenosis on the right side  Assessment: Doing well status post TCAR left carotid stenting.  Follow-up carotid duplex exam 6 months.  She will see our nurse practitioner at that office visit.  Continue statin Plavix aspirin indefinitely.  [name], MD [facility] Office: [phone] Pager: [phone]

## 2017-08-15 NOTE — Progress Notes (Signed)
[facility] [address] [place] [address] [phone]       OFFICE FOLLOW UP NOTE  [name] Date of Birth:  [birth year] Medical Record Number:  [id number]   Reason for Referral:  hospital stroke follow up  CHIEF COMPLAINT:  Chief Complaint  Patient presents with  . Cerebrovascular Accident    Here for f/u of stroke.  Reports compliance with Plavix, ASA, Crestor, BP and diabetes meds.Sandra Shelton    HPI: Sandra Shelton is being seen today for initial visit in the office for left pseudo-watershed territory embolic infarct in setting of diabetic ketoacidosis and high-grade proximal left ICA stenosis on 07/08/2017. History obtained from patient and chart review. Reviewed all radiology images and labs personally.  [name] is a 62 y.o. female with history of hypertension and obesity found down by her sister. She was admitted to the hospital for DKA (new diagnosis of DM).  CT done when RN noted right greater than left grip and showed subacute left frontal and left caudate infarcts.  MRI head reviewed and showed patchy multifocal early subacute left MCA infarcts.  MRA showed small left ICA compared to right with decreased flow in the left MCA distribution along with mild atherosclerotic changes.  Carotid Doppler was negative for significant extracranial stenosis.  2D echo showed an EF of 60 to 65%.  LDL 82 and recommended increasing Crestor from 20 to 40 mg daily.  A1c severely elevated at greater than 15.5 and upon admission for DKA glucose was greater than 800.  History of hypertension but stable during hospitalization and recommended PCP follow-up.  Patient was on aspirin 81 mg PTA and recommended DAPT with aspirin and Plavix for 3 weeks then Plavix alone.  Patient was discharged home with home health services on 07/13/2017.   Left TCAR carotid stent planned for 07/25/2017 due to left carotid stenosis.  Patient tolerated procedure well with residual 0% stenosis.   It was recommended for home health PT and patient was discharged home in stable condition.  Patient had follow-up appointment on 08/15/2017 with Sandra Shelton. Carotid duplex showed widely patent left carotid stent with minimal stenosis on right side. Recommended for repeat carotid duplex in 6 months and continuation of statin, asa and plavix indefinitely.   Patient is being seen today for hospital follow-up.  She states she continues to have clumsiness with her right hand at times but otherwise recovering well on her right side.  She also states she continues to have some right facial droop at the corner of her mouth and right eyelid droop.  She has completed physical therapy but states she continues to do home exercises.  She feels unsteady at times but this is also been improving.  She states she has not returned to work (works at [employer]) at this time due to daily headaches that occur mainly when she wakes up in the morning but occasionally can be in the afternoon.  These headaches last for approximately 1 hour and then resolve.  They mainly are located in the frontal portion of her head bilaterally.  She denies migrainous symptoms and denies history of migraines or headaches in the past.  She rates these headaches 6-7/10 and when asked if they have been improving since her stroke she states "they fluctuate".  She does state that she snores and has fatigue during the day on occasion but not frequently.  She states that her vision has also been slowly worsening as she is unable to see  distance due to blurriness.  Patient states she has not been to ophthalmologist in a while and is aware that she needs to make an appointment due to chronic visual problems.  Patient also has memory complaints since her stroke where at times she feels as though she is slow to remember certain things but will come to her eventually.  She continues to take aspirin and Plavix without side effects of bleeding or bruising.  Continues  to take Crestor without side effects myalgias.  Blood pressure today elevated at 150/64 but typically SBP less than 140.  She has been monitoring glucose levels at home and typically 80-120.  She currently is living at her sister's house due to frequent appointments as patient has not returned to driving but also states she does not on her own car.  Denies new or worsening stroke/TIA symptoms.   ROS:   14 system review of systems performed and negative with exception of fatigue, blurred vision, double vision, cough, snoring, feeling hot, allergies, memory loss, headache, change in appetite and snoring  PMH:  Past Medical History:  Diagnosis Date  . Diabetes mellitus without complication ([facility] [place])    type 2  . Embolic stroke involving carotid artery ([place])   . GERD (gastroesophageal reflux disease)   . Heart murmur    no problems with it  . Hepatitis    in the 80's, took meds at the time   . Hypertension   . Peripheral vascular disease ([facility])    carotid artery blockage   . Stroke ([place]) 07/12/2017  . Vision abnormalities     PSH:  Past Surgical History:  Procedure Laterality Date  . COLONOSCOPY    . TRANSCAROTID ARTERY REVASCULARIZATION Left 07/24/2017   Procedure: TRANSCAROTID ARTERY REVASCULARIZATION, left;  Surgeon: Elam Dutch, MD;  Location: [facility];  Service: Vascular;  Laterality: Left;  . UTERINE FIBROID SURGERY     2005    Social History:  Social History   Socioeconomic History  . Marital status: Single    Spouse name: Not on file  . Number of children: Not on file  . Years of education: Not on file  . Highest education level: Not on file  Occupational History  . Not on file  Social Needs  . Financial resource strain: Not on file  . Food insecurity:    Worry: Not on file    Inability: Not on file  . Transportation needs:    Medical: Not on file    Non-medical: Not on file  Tobacco Use  . Smoking status: Never Smoker  . Smokeless tobacco: Never Used    Substance and Sexual Activity  . Alcohol use: No  . Drug use: No  . Sexual activity: Not on file  Lifestyle  . Physical activity:    Days per week: Not on file    Minutes per session: Not on file  . Stress: Not on file  Relationships  . Social connections:    Talks on phone: Not on file    Gets together: Not on file    Attends religious service: Not on file    Active member of club or organization: Not on file    Attends meetings of clubs or organizations: Not on file    Relationship status: Not on file  . Intimate partner violence:    Fear of current or ex partner: Not on file    Emotionally abused: Not on file    Physically abused: Not on file  Forced sexual activity: Not on file  Other Topics Concern  . Not on file  Social History Narrative  . Not on file    Family History:  Family History  Problem Relation Age of Onset  . Diabetes Mellitus II Sister   . Stroke Mother   . Diabetes Mellitus II Mother   . Cancer Father   . Diabetes Mellitus II Brother     Medications:   Current Outpatient Medications on File Prior to Visit  Medication Sig Dispense Refill  . amLODipine (NORVASC) 10 MG tablet Take 10 mg by mouth daily.    Marland Kitchen aspirin 81 MG tablet Take 1 tablet (81 mg total) by mouth daily. 30 tablet 0  . blood glucose meter kit and supplies KIT Dispense based on patient and insurance preference. Use up to four times daily as directed. (FOR ICD-9 250.00, 250.01). 1 each 0  . clopidogrel (PLAVIX) 75 MG tablet Take 1 tablet (75 mg total) by mouth daily. 30 tablet 0  . INSULIN SYRINGE .5CC/29G 29G X 1/2" 0.5 ML MISC For insulin injection 100 each 0  . LANTUS SOLOSTAR 100 UNIT/ML Solostar Pen Inject 10 Units into the skin 2 (two) times daily.  0  . lisinopril (PRINIVIL,ZESTRIL) 2.5 MG tablet Take 1 tablet (2.5 mg total) by mouth daily. 30 tablet 0  . metFORMIN (GLUCOPHAGE) 500 MG tablet Take 1 tablet (500 mg total) by mouth 2 (two) times daily with a meal. (Patient taking  differently: Take 1,000 mg by mouth 2 (two) times daily with a meal. ) 60 tablet 0  . rosuvastatin (CRESTOR) 40 MG tablet Take 1 tablet (40 mg total) by mouth daily at 6 PM. 80 tablet 0  . Syringe/Needle, Disp, (SYRINGE 3CC/27GX1-1/4") 27G X 1-1/4" 3 ML MISC For insulin injection 100 each 0   No current facility-administered medications on file prior to visit.     Allergies:  No Known Allergies   Physical Exam  Vitals:   08/20/17 1347  BP: (!) 150/64  Pulse: 68  Resp: 16  Weight: 168 lb 8 oz (76.4 kg)  Height: _0  (1.651 m)   Body mass index is 28.04 kg/m. No exam data present  General: well developed, well nourished, pleasant middle-aged African-American female, seated, in no evident distress Head: head normocephalic and atraumatic.   Neck: supple with no carotid or supraclavicular bruits Cardiovascular: regular rate and rhythm, no murmurs Musculoskeletal: no deformity Skin:  no rash/petichiae Vascular:  Normal pulses all extremities  Neurologic Exam Mental Status: Awake and fully alert. Oriented to place and time. Recent and remote memory intact. Attention span, concentration and fund of knowledge appropriate. Mood and affect appropriate.  Cranial Nerves: Fundoscopic exam reveals sharp disc margins. Pupils equal, briskly reactive to light. Extraocular movements full without nystagmus. Visual fields full to confrontation. Hearing intact. Facial sensation intact.  Mild right facial droop present. Motor: Normal bulk and tone. Normal strength in all tested extremity muscles.  Right and left grip strength equal. Sensory.: intact to touch , pinprick , position and vibratory sensation.  Coordination: Rapid alternating movements normal in all extremities. Finger-to-nose and heel-to-shin performed accurately bilaterally.  Hand dexterity equal and right and left hand. Gait and Station: Arises from chair without difficulty. Stance is normal. Gait demonstrates normal stride length and  balance . Able to heel, toe and tandem walk without difficulty.  Reflexes: 1+ and symmetric. Toes downgoing.    NIHSS  0 Modified Rankin  2  Diagnostic Data (Labs, Imaging, Testing)  CT  head without contrast 07/08/2017 IMPRESSION: 1. 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 No hemorrhage or mass effect. 2. Mild atrophy and chronic small-vessel white matter ischemic changes. 3. No significant facial abnormality.  MRI brain without contrast MR MRA head without contrast 07/09/2017 IMPRESSION: MRI HEAD IMPRESSION:  1. Patchy multifocal acute to early subacute ischemic nonhemorrhagic left MCA territory infarcts. No significant mass effect. 2. Otherwise normal brain MRI for age.  MRA HEAD IMPRESSION:  1. Diminutive left ICA as compared to the right, with overall decreased and attenuated flow within the left MCA distribution. Finding of unknown chronicity, although further evaluation with vascular imaging of the neck is suggested to fully evaluate these findings. 2. Mild atherosclerotic change involving the intracranial circulation for age. No proximal high-grade or correctable stenosis identified.  CT angios neck with and without contrast 07/10/2017 IMPRESSION: 1. 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 The time frame of this stenosis remains uncertain. 2. 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 3. Diminutive right vertebral artery, likely congenital.     ASSESSMENT: Allegra Cerniglia is a 62 y.o. year old female here with left MCA infarcts on 07/08/2017 in setting of diabetic ketoacidosis and high-grade proximal left ICA stenosis. Vascular risk factors include HLD, HTN and DM.     PLAN: -Continue aspirin 81 mg daily and clopidogrel 75 mg daily  and Crestor for  secondary stroke prevention -F/u with PCP regarding your HLD, HTN and DM management -f/u with Sandra Shelton as scheduled along with repeat carotid Doppler in 6 months -Schedule appoint with ophthalmology for possible need of new corrective lens prescription which could be contributing daily headaches -Referral placed for sleep apnea study for possible reason of snoring and daily morning headaches -continue to monitor BP at home -start topamax 96m at night for headaches -patient was advised to call after few weeks if tolerating well but headaches are still uncontrolled and can consider increasing dose to 25 mg twice daily -Maintain strict control of hypertension with blood pressure goal below 130/90, diabetes with hemoglobin A1c goal below 6.5% and cholesterol with LDL cholesterol (bad cholesterol) goal below 70 mg/dL. I also advised the patient to eat a healthy diet with plenty of whole grains, cereals, fruits and vegetables, exercise regularly and maintain ideal body weight.  Follow up in 3 months or call earlier if needed  Greater than 50% of time during this 25 minute visit was spent on counseling,explanation of diagnosis of left MCA infarct, reviewing risk factor management of HLD, HTN and DM, planning of further management, discussion with patient and family and coordination of care  JVenancio Poisson AMidmichigan [facility] Hospital[facility] [address] CourtSPolkGBarnesville [place] [address] Phone [phone] [phone]

## 2017-08-20 ENCOUNTER — Ambulatory Visit: Payer: Self-pay | Admitting: Adult Health

## 2017-08-20 ENCOUNTER — Encounter: Payer: Self-pay | Admitting: Adult Health

## 2017-08-20 ENCOUNTER — Other Ambulatory Visit: Payer: Self-pay

## 2017-08-20 VITALS — BP 150/64 | HR 68 | Resp 16 | Ht 65.0 in | Wt 168.5 lb

## 2017-08-20 DIAGNOSIS — E119 Type 2 diabetes mellitus without complications: Secondary | ICD-10-CM

## 2017-08-20 DIAGNOSIS — E785 Hyperlipidemia, unspecified: Secondary | ICD-10-CM

## 2017-08-20 DIAGNOSIS — I63512 Cerebral infarction due to unspecified occlusion or stenosis of left middle cerebral artery: Secondary | ICD-10-CM

## 2017-08-20 DIAGNOSIS — I63132 Cerebral infarction due to embolism of left carotid artery: Secondary | ICD-10-CM

## 2017-08-20 MED ORDER — TOPIRAMATE 25 MG PO TABS: 25.0000 mg | ORAL_TABLET | Freq: Every evening | ORAL | 3 refills | Status: DC

## 2017-08-20 NOTE — Patient Instructions (Signed)
Continue aspirin 81 mg daily and clopidogrel 75 mg daily  and crestor  for secondary stroke prevention  Start topamax 25mg  for daily headaches  Follow up with Dr. Oneida Alar as scheduled for 6 month follow up with carotid ultrasound   Continue to follow up with PCP regarding cholesterol, blood pressure and diabetes management   Schedule appointment with eye doctor regarding increased blurry vision  You will be referred to for sleep apnea evaluation - we will call you to schedule appointment  Continue to stay active and maintain a healthy diet  Continue to monitor blood pressure and glucose levels at home  Work on memory exercises for decreased memory such as suduku, cross word puzzles, word search, card games or any other type of activity to keep your mind active  Maintain strict control of hypertension with blood pressure goal below 130/90, diabetes with hemoglobin A1c goal below 6.5% and cholesterol with LDL cholesterol (bad cholesterol) goal below 70 mg/dL. I also advised the patient to eat a healthy diet with plenty of whole grains, cereals, fruits and vegetables, exercise regularly and maintain ideal body weight.  Followup in the future with me in 3 months or call earlier if needed        Thank you for coming to see [place] at [facility]. I hope we have been able to provide you high quality care today.  You may receive a patient satisfaction survey over the next few weeks. We would appreciate your feedback and comments so that we may continue to improve ourselves and the health of our patients.

## 2017-08-29 NOTE — Progress Notes (Signed)
I agree with the above plan 

## 2017-09-02 ENCOUNTER — Telehealth: Payer: Self-pay

## 2017-09-02 ENCOUNTER — Other Ambulatory Visit: Payer: Self-pay

## 2017-09-02 ENCOUNTER — Ambulatory Visit: Payer: Medicaid Other

## 2017-09-02 ENCOUNTER — Ambulatory Visit: Payer: Medicaid Other | Attending: Internal Medicine | Admitting: Internal Medicine

## 2017-09-02 ENCOUNTER — Encounter: Payer: Self-pay | Admitting: Internal Medicine

## 2017-09-02 VITALS — BP 160/80 | HR 86 | Temp 97.7°F | Resp 16 | Ht 65.0 in | Wt 166.0 lb

## 2017-09-02 DIAGNOSIS — Z8673 Personal history of transient ischemic attack (TIA), and cerebral infarction without residual deficits: Secondary | ICD-10-CM | POA: Insufficient documentation

## 2017-09-02 DIAGNOSIS — Z79899 Other long term (current) drug therapy: Secondary | ICD-10-CM | POA: Insufficient documentation

## 2017-09-02 DIAGNOSIS — Z7982 Long term (current) use of aspirin: Secondary | ICD-10-CM | POA: Insufficient documentation

## 2017-09-02 DIAGNOSIS — E1159 Type 2 diabetes mellitus with other circulatory complications: Secondary | ICD-10-CM | POA: Diagnosis not present

## 2017-09-02 DIAGNOSIS — I10 Essential (primary) hypertension: Secondary | ICD-10-CM | POA: Diagnosis not present

## 2017-09-02 DIAGNOSIS — I6330 Cerebral infarction due to thrombosis of unspecified cerebral artery: Secondary | ICD-10-CM

## 2017-09-02 DIAGNOSIS — I739 Peripheral vascular disease, unspecified: Secondary | ICD-10-CM

## 2017-09-02 DIAGNOSIS — E119 Type 2 diabetes mellitus without complications: Secondary | ICD-10-CM | POA: Insufficient documentation

## 2017-09-02 DIAGNOSIS — Z7902 Long term (current) use of antithrombotics/antiplatelets: Secondary | ICD-10-CM | POA: Diagnosis not present

## 2017-09-02 DIAGNOSIS — Z2821 Immunization not carried out because of patient refusal: Secondary | ICD-10-CM

## 2017-09-02 DIAGNOSIS — I6522 Occlusion and stenosis of left carotid artery: Secondary | ICD-10-CM | POA: Diagnosis not present

## 2017-09-02 DIAGNOSIS — Z23 Encounter for immunization: Secondary | ICD-10-CM

## 2017-09-02 DIAGNOSIS — Z794 Long term (current) use of insulin: Secondary | ICD-10-CM | POA: Diagnosis not present

## 2017-09-02 DIAGNOSIS — L84 Corns and callosities: Secondary | ICD-10-CM | POA: Insufficient documentation

## 2017-09-02 DIAGNOSIS — I779 Disorder of arteries and arterioles, unspecified: Secondary | ICD-10-CM | POA: Insufficient documentation

## 2017-09-02 LAB — GLUCOSE, POCT (MANUAL RESULT ENTRY): POC Glucose: 162 mg/dl — AB (ref 70–99)

## 2017-09-02 MED ORDER — LANTUS SOLOSTAR 100 UNIT/ML ~~LOC~~ SOPN: 10.0000 [IU] | PEN_INJECTOR | Freq: Two times a day (BID) | SUBCUTANEOUS | 6 refills | Status: DC

## 2017-09-02 MED ORDER — ASPIRIN 81 MG PO TABS: 81.0000 mg | ORAL_TABLET | Freq: Every day | ORAL | 2 refills | Status: AC

## 2017-09-02 MED ORDER — TETANUS-DIPHTH-ACELL PERTUSSIS 5-2.5-18.5 LF-MCG/0.5 IM SUSP: 0.5000 mL | Freq: Once | INTRAMUSCULAR | 0 refills | Status: AC

## 2017-09-02 MED ORDER — LISINOPRIL 5 MG PO TABS: 2.5000 mg | ORAL_TABLET | Freq: Every day | ORAL | 6 refills | Status: DC

## 2017-09-02 MED ORDER — ROSUVASTATIN CALCIUM 40 MG PO TABS: 40.0000 mg | ORAL_TABLET | Freq: Every day | ORAL | 6 refills | Status: DC

## 2017-09-02 MED ORDER — METFORMIN HCL 500 MG PO TABS: 500.0000 mg | ORAL_TABLET | Freq: Two times a day (BID) | ORAL | 6 refills | Status: DC

## 2017-09-02 MED ORDER — AMLODIPINE BESYLATE 10 MG PO TABS: 10.0000 mg | ORAL_TABLET | Freq: Every day | ORAL | 6 refills | Status: DC

## 2017-09-02 MED ORDER — TOPIRAMATE 25 MG PO TABS: 25.0000 mg | ORAL_TABLET | Freq: Every evening | ORAL | 6 refills | Status: DC

## 2017-09-02 MED ORDER — CLOPIDOGREL BISULFATE 75 MG PO TABS: 75.0000 mg | ORAL_TABLET | Freq: Every day | ORAL | 6 refills | Status: DC

## 2017-09-02 MED FILL — ?METFORMIN HCL 500MG TABS: 500 | 30 days supply | Qty: 60 | Fill #0

## 2017-09-02 MED FILL — ?CLOPIDOGREL 75MG TA: 75 | 30 days supply | Qty: 30 | Fill #0

## 2017-09-02 MED FILL — !LANTUS SOLOSTAR 100UNITS/M: 100 | 30 days supply | Qty: 6 | Fill #0

## 2017-09-02 MED FILL — LISINOPRIL 5 MG TAB: 5 | 30 days supply | Qty: 15 | Fill #0

## 2017-09-02 MED FILL — AMLODIPINE BESYLATE 10 MG T: 10 | 30 days supply | Qty: 30 | Fill #0

## 2017-09-02 MED FILL — ROSUVASTATIN CALCIUM 40 MG: 40 | 30 days supply | Qty: 30 | Fill #0

## 2017-09-02 MED FILL — TOPIRAMATE 25 MG TABS: 25 | 30 days supply | Qty: 30 | Fill #0

## 2017-09-02 NOTE — Telephone Encounter (Signed)
Met with the patient and her sister today when they were in the clinic. Provided her with a SCAT application. She did not want to complete it while in the clinic.  She does not have medicaid or the [place] Card/CAFA yet and she was going to meet with Armstead Peaks, [facility] Financial Counselor before leaving the clinic. Informed her that once she is approved for the [employer], she call apply for [facility] transportation.

## 2017-09-02 NOTE — Patient Instructions (Addendum)
Increase lisinopril to 5 mg daily.  Try to limit salt in the foods.  Try to get your eye exam done as soon as possible.   Diabetes Mellitus and Standards of Medical Care Managing diabetes (diabetes mellitus) can be complicated. Your diabetes treatment may be managed by a team of health care providers, including:  A diet and nutrition specialist (registered dietitian).  A nurse.  A certified diabetes educator (CDE).  A diabetes specialist (endocrinologist).  An eye doctor.  A primary care provider.  A dentist.  Your health care providers follow a schedule in order to help you get the best quality of care. The following schedule is a general guideline for your diabetes management plan. Your health care providers may also give you more specific instructions. HbA1c ( hemoglobin A1c) test This test provides information about blood sugar (glucose) control over the previous 2-3 months. It is used to check whether your diabetes management plan needs to be adjusted.  If you are meeting your treatment goals, this test is done at least 2 times a year.  If you are not meeting treatment goals or if your treatment goals have changed, this test is done 4 times a year.  Blood pressure test  This test is done at every routine medical visit. For most people, the goal is less than 130/80. Ask your health care provider what your goal blood pressure should be. Dental and eye exams  Visit your dentist two times a year.  If you have type 1 diabetes, get an eye exam 3-5 years after you are diagnosed, and then once a year after your first exam. ? If you were diagnosed with type 1 diabetes as a child, get an eye exam when you are age 8 or older and have had diabetes for 3-5 years. After the first exam, you should get an eye exam once a year.  If you have type 2 diabetes, have an eye exam as soon as you are diagnosed, and then once a year after your first exam. Foot care exam  Visual foot exams are  done at every routine medical visit. The exams check for cuts, bruises, redness, blisters, sores, or other problems with the feet.  A complete foot exam is done by your health care provider once a year. This exam includes an inspection of the structure and skin of your feet, and a check of the pulses and sensation in your feet. ? Type 1 diabetes: Get your first exam 3-5 years after diagnosis. ? Type 2 diabetes: Get your first exam as soon as you are diagnosed.  Check your feet every day for cuts, bruises, redness, blisters, or sores. If you have any of these or other problems that are not healing, contact your health care provider. Kidney function test ( urine microalbumin)  This test is done once a year. ? Type 1 diabetes: Get your first test 5 years after diagnosis. ? Type 2 diabetes: Get your first test as soon as you are diagnosed.  If you have chronic kidney disease (CKD), get a serum creatinine and estimated glomerular filtration rate (eGFR) test once a year. Lipid profile (cholesterol, HDL, LDL, triglycerides)  This test should be done when you are diagnosed with diabetes, and every 5 years after the first test. If you are on medicines to lower your cholesterol, you may need to get this test done every year. ? The goal for LDL is less than 100 mg/dL (5.5 mmol/L). If you are at high risk, the  goal is less than 70 mg/dL (3.9 mmol/L). ? The goal for HDL is 40 mg/dL (2.2 mmol/L) for men and 50 mg/dL(2.8 mmol/L) for women. An HDL cholesterol of 60 mg/dL (3.3 mmol/L) or higher gives some protection against heart disease. ? The goal for triglycerides is less than 150 mg/dL (8.3 mmol/L). Immunizations  The yearly flu (influenza) vaccine is recommended for everyone 6 months or older who has diabetes.  The pneumonia (pneumococcal) vaccine is recommended for everyone 2 years or older who has diabetes. If you are 32 or older, you may get the pneumonia vaccine as a series of two separate  shots.  The hepatitis B vaccine is recommended for adults shortly after they have been diagnosed with diabetes.  The Tdap (tetanus, diphtheria, and pertussis) vaccine should be given: ? According to normal childhood vaccination schedules, for children. ? Every 10 years, for adults who have diabetes.  The shingles vaccine is recommended for people who have had chicken pox and are 50 years or older. Mental and emotional health  Screening for symptoms of eating disorders, anxiety, and depression is recommended at the time of diagnosis and afterward as needed. If your screening shows that you have symptoms (you have a positive screening result), you may need further evaluation and be referred to a mental health care provider. Diabetes self-management education  Education about how to manage your diabetes is recommended at diagnosis and ongoing as needed. Treatment plan  Your treatment plan will be reviewed at every medical visit. Summary  Managing diabetes (diabetes mellitus) can be complicated. Your diabetes treatment may be managed by a team of health care providers.  Your health care providers follow a schedule in order to help you get the best quality of care.  Standards of care including having regular physical exams, blood tests, blood pressure monitoring, immunizations, screening tests, and education about how to manage your diabetes.  Your health care providers may also give you more specific instructions based on your individual health. This information is not intended to replace advice given to you by your health care provider. Make sure you discuss any questions you have with your health care provider. Document Released: 10/29/2008 Document Revised: 09/30/2015 Document Reviewed: 09/30/2015 Elsevier Interactive Patient Education  2018 [place] (Tetanus and Diphtheria): What You Need to Know 1. Why get vaccinated? Tetanus  and diphtheria are very serious  diseases. They are rare in the [place] today, but people who do become infected often have severe complications. Td vaccine is used to protect adolescents and adults from both of these diseases. Both tetanus and diphtheria are infections caused by bacteria. Diphtheria spreads from person to person through coughing or sneezing. Tetanus-causing bacteria enter the body through cuts, scratches, or wounds. TETANUS (lockjaw) causes painful muscle tightening and stiffness, usually all over the body.  It can lead to tightening of muscles in the head and neck so you can't open your mouth, swallow, or sometimes even breathe. Tetanus kills about 1 out of every 10 people who are infected even after receiving the best medical care.  DIPHTHERIA can cause a thick coating to form in the back of the throat.  It can lead to breathing problems, paralysis, heart failure, and death.  Before vaccines, as many as 200,000 cases of diphtheria and hundreds of cases of tetanus were reported in the [place] each year. Since vaccination began, reports of cases for both diseases have dropped by about 99%. 2. Td vaccine Td vaccine can  protect adolescents and adults from tetanus and diphtheria. Td is usually given as a booster dose every 10 years but it can also be given earlier after a severe and dirty wound or burn. Another vaccine, called Tdap, which protects against pertussis in addition to tetanus and diphtheria, is sometimes recommended instead of Td vaccine. Your doctor or the person giving you the vaccine can give you more information. Td may safely be given at the same time as other vaccines. 3. Some people should not get this vaccine  A person who has ever had a life-threatening allergic reaction after a previous dose of any tetanus or diphtheria containing vaccine, OR has a severe allergy to any part of this vaccine, should not get Td vaccine. Tell the person giving the vaccine about any severe  allergies.  Talk to your doctor if you: ? had severe pain or swelling after any vaccine containing diphtheria or tetanus, ? ever had a condition called Guillain Barre Syndrome (GBS), ? aren't feeling well on the day the shot is scheduled. 4. What are the risks from Td vaccine? With any medicine, including vaccines, there is a chance of side effects. These are usually mild and go away on their own. Serious reactions are also possible but are rare. Most people who get Td vaccine do not have any problems with it. Mild problems following Td vaccine: (Did not interfere with activities)  Pain where the shot was given (about 8 people in 10)  Redness or swelling where the shot was given (about 1 person in 4)  Mild fever (rare)  Headache (about 1 person in 4)  Tiredness (about 1 person in 4)  Moderate problems following Td vaccine: (Interfered with activities, but did not require medical attention)  Fever over 102F (rare)  Severe problems following Td vaccine: (Unable to perform usual activities; required medical attention)  Swelling, severe pain, bleeding and/or redness in the arm where the shot was given (rare).  Problems that could happen after any vaccine:  People sometimes faint after a medical procedure, including vaccination. Sitting or lying down for about 15 minutes can help prevent fainting, and injuries caused by a fall. Tell your doctor if you feel dizzy, or have vision changes or ringing in the ears.  Some people get severe pain in the shoulder and have difficulty moving the arm where a shot was given. This happens very rarely.  Any medication can cause a severe allergic reaction. Such reactions from a vaccine are very rare, estimated at fewer than 1 in a million doses, and would happen within a few minutes to a few hours after the vaccination. As with any medicine, there is a very remote chance of a vaccine causing a serious injury or death. The safety of vaccines is  always being monitored. For more information, visit: http://www.aguilar.org/ 5. What if there is a serious reaction? What should I look for? Look for anything that concerns you, such as signs of a severe allergic reaction, very high fever, or unusual behavior. Signs of a severe allergic reaction can include hives, swelling of the face and throat, difficulty breathing, a fast heartbeat, dizziness, and weakness. These would usually start a few minutes to a few hours after the vaccination. What should I do?  If you think it is a severe allergic reaction or other emergency that can't wait, call 9-1-1 or get the person to the nearest hospital. Otherwise, call your doctor.  Afterward, the reaction should be reported to the Vaccine Adverse Event Reporting  System (VAERS). Your doctor might file this report, or you can do it yourself through the VAERS web site at www.vaers.SamedayNews.es, or by calling 617-791-0273. ? VAERS does not give medical advice. 6. The National Vaccine Injury Compensation Program The [employer] Vaccine Injury Compensation Program (VICP) is a federal program that was created to compensate people who may have been injured by certain vaccines. Persons who believe they may have been injured by a vaccine can learn about the program and about filing a claim by calling 262-397-8695 or visiting the [employer] website at GoldCloset.com.ee. There is a time limit to file a claim for compensation. 7. How can I learn more?  Ask your doctor. He or she can give you the vaccine package insert or suggest other sources of information.  Call your local or state health department.  Contact the Centers for Disease Control and Prevention (CDC): ? Call 7254564375 (1-800-CDC-INFO) ? Visit CDC's website at http://hunter.com/ CDC Td Vaccine VIS (04/26/15) This information is not intended to replace advice given to you by your health care provider. Make sure you discuss any questions you  have with your health care provider. Document Released: 10/29/2005 Document Revised: 09/22/2015 Document Reviewed: 09/22/2015 Elsevier Interactive Patient Education  2017 [employer].   Pneumococcal Polysaccharide Vaccine: What You Need to Know 1. Why get vaccinated? Vaccination can protect older adults (and some children and younger adults) from pneumococcal disease. Pneumococcal disease is caused by bacteria that can spread from person to person through close contact. It can cause ear infections, and it can also lead to more serious infections of the:  Lungs (pneumonia),  Blood (bacteremia), and  Covering of the brain and spinal cord (meningitis). Meningitis can cause deafness and brain damage, and it can be fatal.  Anyone can get pneumococcal disease, but children under 90 years of age, people with certain medical conditions, adults over 95 years of age, and cigarette smokers are at the highest risk. About 18,000 older adults die each year from pneumococcal disease in the [place]. Treatment of pneumococcal infections with penicillin and other drugs used to be more effective. But some strains of the disease have become resistant to these drugs. This makes prevention of the disease, through vaccination, even more important. 2. Pneumococcal polysaccharide vaccine (PPSV23) Pneumococcal polysaccharide vaccine (PPSV23) protects against 23 types of pneumococcal bacteria. It will not prevent all pneumococcal disease. PPSV23 is recommended for:  All adults 30 years of age and older,  Anyone 2 through 64 years of age with certain long-term health problems,  Anyone 2 through 64 years of age with a weakened immune system,  Adults 27 through 64 years of age who smoke cigarettes or have asthma.  Most people need only one dose of PPSV. A second dose is recommended for certain high-risk groups. People 45 and older should get a dose even if they have gotten one or more doses of the vaccine  before they turned 65. Your healthcare provider can give you more information about these recommendations. Most healthy adults develop protection within 2 to 3 weeks of getting the shot. 3. Some people should not get this vaccine  Anyone who has had a life-threatening allergic reaction to PPSV should not get another dose.  Anyone who has a severe allergy to any component of PPSV should not receive it. Tell your provider if you have any severe allergies.  Anyone who is moderately or severely ill when the shot is scheduled may be asked to wait until they recover before getting the  vaccine. Someone with a mild illness can usually be vaccinated.  Children less than 57 years of age should not receive this vaccine.  There is no evidence that PPSV is harmful to either a pregnant woman or to her fetus. However, as a precaution, women who need the vaccine should be vaccinated before becoming pregnant, if possible. 4. Risks of a vaccine reaction With any medicine, including vaccines, there is a chance of side effects. These are usually mild and go away on their own, but serious reactions are also possible. About half of people who get PPSV have mild side effects, such as redness or pain where the shot is given, which go away within about two days. Less than 1 out of 100 people develop a fever, muscle aches, or more severe local reactions. Problems that could happen after any vaccine:  People sometimes faint after a medical procedure, including vaccination. Sitting or lying down for about 15 minutes can help prevent fainting, and injuries caused by a fall. Tell your doctor if you feel dizzy, or have vision changes or ringing in the ears.  Some people get severe pain in the shoulder and have difficulty moving the arm where a shot was given. This happens very rarely.  Any medication can cause a severe allergic reaction. Such reactions from a vaccine are very rare, estimated at about 1 in a million doses,  and would happen within a few minutes to a few hours after the vaccination. As with any medicine, there is a very remote chance of a vaccine causing a serious injury or death. The safety of vaccines is always being monitored. For more information, visit: http://www.aguilar.org/ 5. What if there is a serious reaction? What should I look for? Look for anything that concerns you, such as signs of a severe allergic reaction, very high fever, or unusual behavior. Signs of a severe allergic reaction can include hives, swelling of the face and throat, difficulty breathing, a fast heartbeat, dizziness, and weakness. These would usually start a few minutes to a few hours after the vaccination. What should I do? If you think it is a severe allergic reaction or other emergency that can't wait, call 9-1-1 or get to the nearest hospital. Otherwise, call your doctor. Afterward, the reaction should be reported to the Vaccine Adverse Event Reporting System (VAERS). Your doctor might file this report, or you can do it yourself through the VAERS web site at www.vaers.SamedayNews.es, or by calling [phone]. VAERS does not give medical advice. 6. How can I learn more?  Ask your doctor. He or she can give you the vaccine package insert or suggest other sources of information.  Call your local or state health department.  Contact the Centers for Disease Control and Prevention (CDC): ? Call 313-509-3437 (1-800-CDC-INFO) or ? Visit CDC's website at http://hunter.com/ CDC Pneumococcal Polysaccharide Vaccine VIS (05/08/13) This information is not intended to replace advice given to you by your health care provider. Make sure you discuss any questions you have with your health care provider. Document Released: 10/29/2005 Document Revised: 09/22/2015 Document Reviewed: 09/22/2015 Elsevier Interactive Patient Education  2017 [employer].

## 2017-09-02 NOTE — Progress Notes (Signed)
Patient ID: Sandra Shelton, female    DOB: [birth year]  MRN: [id number]  CC: Hospitalization Follow-up and Establish Care   Subjective: Sandra Shelton is a 62 y.o. female who presents for new patient visit posthospitalization. Sister, Sandra Shelton, is with her. 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   Previous PCP was Dr. Jackson Latino.  Last seen 03/2017. Decided to change due to lack of insurance  Patient is a 63 year old female who was hospitalized 6/21- 29/2019 with DKA (newly dx DM) after being found obtunded at home, AKI with Creat 4.6, new acute left MCA stroke with MRA showing high-grade left carotid stenosis.  Echo revealed LVH with EF of 75 to 80% and grade 1 diastolic dysfunction.  No carotid emboli source.  Patient was discharged on insulin 70/30 8 units twice daily, Plavix and aspirin and plan was for stent placement to the left ICA.  Patient readmitted 7/10-11/2017 and underwent successful trans-carotid artery revascularization with stent placement to LT carotid artery.  Plan is for follow-up with neurology and repeat carotid duplex in 2 to 3 weeks.  Diabetes regimen was changed to Lantus 10 units twice a day with metformin 500 mg twice daily.  She has seen neurology and vascular surgeon Dr. Oneida Alar already.  Plan is for follow-up carotid Doppler in 6 months.  She was advised to continue aspirin and Plavix indefinitely. -Referred for sleep study to evaluate for OSA as possible cause of snoring and daily morning headaches.  -Started on Topamax for headache  sleep study scheduled for 10/14/2017.   DM:  Checking BS TID before meals.  Gives range 81-117.  No 200s.  One low in the 60s.  Forgot to eat.   Did see nutritionist in [place]. Walking daily + blurred vision.  Wears glasses No numbness in hands/feet  HTN:  Compliant with Lisinopril and salt  restriction  Strong fhx of DM in mother and her siblings  Patient Active Problem List   Diagnosis Date Noted  . Embolic stroke involving left carotid artery ([place]) 07/24/2017  . Cerebral thrombosis with cerebral infarction 07/08/2017  . DKA, type 2 ([place]) 07/06/2017  . DKA (diabetic ketoacidoses) ([place]) 07/06/2017  . Metabolic acidosis   . Acute renal failure ([place])   . Dehydration      Current Outpatient Medications on File Prior to Visit  Medication Sig Dispense Refill  . amLODipine (NORVASC) 10 MG tablet Take 10 mg by mouth daily.    Marland Kitchen aspirin 81 MG tablet Take 1 tablet (81 mg total) by mouth daily. 30 tablet 0  . blood glucose meter kit and supplies KIT Dispense based on patient and insurance preference. Use up to four times daily as directed. (FOR ICD-9 250.00, 250.01). 1 each 0  . clopidogrel (PLAVIX) 75 MG tablet Take 1 tablet (75 mg total) by mouth daily. 30 tablet 0  . INSULIN SYRINGE .5CC/29G 29G X 1/2" 0.5 ML MISC For insulin injection 100 each 0  . LANTUS SOLOSTAR 100 UNIT/ML Solostar Pen Inject 10 Units into the skin 2 (two) times daily.  0  . lisinopril (PRINIVIL,ZESTRIL) 2.5 MG tablet Take 1 tablet (2.5 mg total) by mouth daily. 30 tablet 0  . metFORMIN (GLUCOPHAGE) 500 MG tablet Take 1 tablet (500 mg total) by mouth 2 (two) times daily with a meal. (Patient taking differently: Take 1,000 mg by mouth 2 (two) times daily  with a meal. ) 60 tablet 0  . rosuvastatin (CRESTOR) 40 MG tablet Take 1 tablet (40 mg total) by mouth daily at 6 PM. 80 tablet 0  . Syringe/Needle, Disp, (SYRINGE 3CC/27GX1-1/4") 27G X 1-1/4" 3 ML MISC For insulin injection 100 each 0  . topiramate (TOPAMAX) 25 MG tablet Take 1 tablet (25 mg total) by mouth every evening. 30 tablet 3   No current facility-administered medications on file prior to visit.     No Known Allergies  Social History   Socioeconomic History  . Marital status: Single    Spouse name: Not on file  . Number of children: Not on  file  . Years of education: Not on file  . Highest education level: Not on file  Occupational History  . Not on file  Social Needs  . Financial resource strain: Not on file  . Food insecurity:    Worry: Not on file    Inability: Not on file  . Transportation needs:    Medical: Not on file    Non-medical: Not on file  Tobacco Use  . Smoking status: Never Smoker  . Smokeless tobacco: Never Used  Substance and Sexual Activity  . Alcohol use: No  . Drug use: No  . Sexual activity: Not on file  Lifestyle  . Physical activity:    Days per week: Not on file    Minutes per session: Not on file  . Stress: Not on file  Relationships  . Social connections:    Talks on phone: Not on file    Gets together: Not on file    Attends religious service: Not on file    Active member of club or organization: Not on file    Attends meetings of clubs or organizations: Not on file    Relationship status: Not on file  . Intimate partner violence:    Fear of current or ex partner: Not on file    Emotionally abused: Not on file    Physically abused: Not on file    Forced sexual activity: Not on file  Other Topics Concern  . Not on file  Social History Narrative  . Not on file    Family History  Problem Relation Age of Onset  . Diabetes Mellitus II Sister   . Stroke Mother   . Diabetes Mellitus II Mother   . Cancer Father   . Diabetes Mellitus II Brother     Past Surgical History:  Procedure Laterality Date  . COLONOSCOPY    . TRANSCAROTID ARTERY REVASCULARIZATION Left 07/24/2017   Procedure: TRANSCAROTID ARTERY REVASCULARIZATION, left;  Surgeon: Elam Dutch, MD;  Location: [facility] OR;  Service: Vascular;  Laterality: Left;  . UTERINE FIBROID SURGERY     2005    ROS: Review of Systems  Constitutional: Negative for activity change and appetite change.  HENT: Negative for congestion.   Eyes: Positive for visual disturbance.  Respiratory: Negative for chest tightness and  shortness of breath.   Cardiovascular: Negative for chest pain.  Gastrointestinal: Negative for abdominal pain.    PHYSICAL EXAM: BP (!) 165/78   Pulse 86   Temp 97.7 F (36.5 C) (Oral)   Resp 16   Ht 5' 5"  (1.651 m)   Wt 166 lb (75.3 kg)   SpO2 100%   BMI 27.62 kg/m   Wt Readings from Last 3 Encounters:  09/02/17 166 lb (75.3 kg)  08/20/17 168 lb 8 oz (76.4 kg)  08/15/17 167 lb (75.8 kg)  Physical Exam  General appearance - alert, well appearing, and in no distress Mental status - normal mood, behavior, speech, dress, motor activity, and thought processes Eyes - pupils equal and reactive, extraocular eye movements intact Mouth - mucous membranes moist, pharynx normal without lesions Neck - supple, no significant adenopathy Chest - clear to auscultation, no wheezes, rales or rhonchi, symmetric air entry Heart - normal rate, regular rhythm, normal S1, S2, no murmurs, rubs, clicks or gallops Neurological -mild right facial droop.  No dysarthria.  Gait is normal Extremities - peripheral pulses normal, no pedal edema, no clubbing or cyanosis Diabetic Foot Exam - Simple   Simple Foot Form Visual Inspection See comments:  Yes Sensation Testing Intact to touch and monofilament testing bilaterally:  Yes Pulse Check Posterior Tibialis and Dorsalis pulse intact bilaterally:  Yes Comments Callous on ball both feet under big toe and 5 toe     Lab Results  Component Value Date   WBC 6.6 07/25/2017   HGB 10.1 (L) 07/25/2017   HCT 31.6 (L) 07/25/2017   MCV 88.0 07/25/2017   PLT 362 07/25/2017     Chemistry      Component Value Date/Time   NA 139 07/25/2017 0409   K 3.0 (L) 07/25/2017 0409   CL 102 07/25/2017 0409   CO2 27 07/25/2017 0409   BUN <5 (L) 07/25/2017 0409   CREATININE 0.69 07/25/2017 0409      Component Value Date/Time   CALCIUM 8.7 (L) 07/25/2017 0409   ALKPHOS 47 07/23/2017 1353   AST 23 07/23/2017 1353   ALT 24 07/23/2017 1353   BILITOT 0.4  07/23/2017 1353     Depression screen PHQ 2/9 09/02/2017  Decreased Interest 0  Down, Depressed, Hopeless 0  PHQ - 2 Score 0     ASSESSMENT AND PLAN: 1. Controlled type 2 diabetes mellitus with other circulatory complication, with long-term current use of insulin (HCC) Discussed the importance of healthy eating habits, regular aerobic exercise (at least 150 minutes a week as tolerated) and medication compliance to achieve or maintain control of diabetes.  - POCT glucose (manual entry) - Microalbumin / creatinine urine ratio - CBC - Comprehensive metabolic panel - LANTUS SOLOSTAR 100 UNIT/ML Solostar Pen; Inject 10 Units into the skin 2 (two) times daily.  Dispense: 15 mL; Refill: 6 - metFORMIN (GLUCOPHAGE) 500 MG tablet; Take 1 tablet (500 mg total) by mouth 2 (two) times daily with a meal.  Dispense: 60 tablet; Refill: 6 - Lipid panel - Glutamic acid decarboxylase auto abs - Anti-islet cell antibody  2. Essential hypertension Not at goal.  Increase lisinopril to 5 mg daily - amLODipine (NORVASC) 10 MG tablet; Take 1 tablet (10 mg total) by mouth daily.  Dispense: 30 tablet; Refill: 6 - lisinopril (PRINIVIL,ZESTRIL) 5 MG tablet;  by mouth daily.  Dispense: 30 tablet; Refill: 6  3. Left-sided carotid artery disease, unspecified type ([place]) - rosuvastatin (CRESTOR) 40 MG tablet; Take 1 tablet (40 mg total) by mouth daily at 6 PM.  Dispense: 30 tablet; Refill: 6 - clopidogrel (PLAVIX) 75 MG tablet; Take 1 tablet (75 mg total) by mouth daily.  Dispense: 30 tablet; Refill: 6  4. Need for Tdap vaccination 5. Need for vaccination for Strep pneumoniae 6. Influenza vaccination declined   7. Pre-ulcerative corn or callous Good diabetic foot care discussed.  We will plan to refer to podiatry when she is approved for the orange card  8. History of CVA (cerebrovascular accident) Stressed the importance of secondary  prevention measures including good blood pressure and diabetes control. -  rosuvastatin (CRESTOR) 40 MG tablet; Take 1 tablet (40 mg total) by mouth daily at 6 PM.  Dispense: 30 tablet; Refill: 6 - clopidogrel (PLAVIX) 75 MG tablet; Take 1 tablet (75 mg total) by mouth daily.  Dispense: 30 tablet; Refill: 6 - aspirin 81 MG tablet; Take 1 tablet (81 mg total) by mouth daily.  Dispense: 100 tablet; Refill: 2  Patient was given the opportunity to ask questions.  Patient verbalized understanding of the plan and was able to repeat key elements of the plan.   No orders of the defined types were placed in this encounter.    Requested Prescriptions    No prescriptions requested or ordered in this encounter    No follow-ups on file.  [name], MD, FACP

## 2017-09-02 NOTE — Progress Notes (Signed)
CBG 162 

## 2017-09-04 ENCOUNTER — Telehealth: Payer: Self-pay | Admitting: Internal Medicine

## 2017-09-04 ENCOUNTER — Telehealth: Payer: Self-pay

## 2017-09-04 NOTE — Telephone Encounter (Signed)
Contacted pt to go over lab results pt is aware and doesn't have any questions or concerns 

## 2017-09-04 NOTE — Addendum Note (Signed)
Addended by: [name] B on: 09/04/2017 11:48 AM   Modules accepted: Orders

## 2017-09-04 NOTE — Telephone Encounter (Signed)
Called patient and scheduled a 3 month f/u with PCP on 12/09/17.

## 2017-09-04 NOTE — Progress Notes (Signed)
Ca level elevated.  Add PTH. Results for orders placed or performed in visit on 09/02/17  Microalbumin / creatinine urine ratio  Result Value Ref Range   Creatinine, Urine 209.6 Not Estab. mg/dL   Microalbumin, Urine 146.7 Not Estab. ug/mL   Microalb/Creat Ratio 70.0 (H) 0.0 - 30.0 mg/g creat  CBC  Result Value Ref Range   WBC 7.5 3.4 - 10.8 x10E3/uL   RBC 4.67 3.77 - 5.28 x10E6/uL   Hemoglobin 13.0 11.1 - 15.9 g/dL   Hematocrit 39.2 34.0 - 46.6 %   MCV 84 79 - 97 fL   MCH 27.8 26.6 - 33.0 pg   MCHC 33.2 31.5 - 35.7 g/dL   RDW 13.4 12.3 - 15.4 %   Platelets 394 150 - 450 x10E3/uL  Comprehensive metabolic panel  Result Value Ref Range   Glucose 90 65 - 99 mg/dL   BUN 12 8 - 27 mg/dL   Creatinine, Ser 0.78 0.57 - 1.00 mg/dL   GFR calc non Af Amer 81 >59 mL/min/1.73   GFR calc Af Amer 93 >59 mL/min/1.73   BUN/Creatinine Ratio 15 12 - 28   Sodium 140 134 - 144 mmol/L   Potassium 3.5 3.5 - 5.2 mmol/L   Chloride 105 96 - 106 mmol/L   CO2 19 (L) 20 - 29 mmol/L   Calcium 10.7 (H) 8.7 - 10.3 mg/dL   Total Protein 7.9 6.0 - 8.5 g/dL   Albumin 5.0 (H) 3.6 - 4.8 g/dL   Globulin, Total 2.9 1.5 - 4.5 g/dL   Albumin/Globulin Ratio 1.7 1.2 - 2.2   Bilirubin Total <0.2 0.0 - 1.2 mg/dL   Alkaline Phosphatase 72 39 - 117 IU/L   AST 32 0 - 40 IU/L   ALT 36 (H) 0 - 32 IU/L  Lipid panel  Result Value Ref Range   Cholesterol, Total 147 100 - 199 mg/dL   Triglycerides 105 0 - 149 mg/dL   HDL 48 >39 mg/dL   VLDL Cholesterol Cal 21 5 - 40 mg/dL   LDL Calculated 78 0 - 99 mg/dL   Chol/HDL Ratio 3.1 0.0 - 4.4 ratio  Glutamic acid decarboxylase auto abs  Result Value Ref Range   Glutamic Acid Decarb Ab WILL FOLLOW   Anti-islet cell antibody  Result Value Ref Range   Islet Cell Ab N Neg:<1:1  POCT glucose (manual entry)  Result Value Ref Range   POC Glucose 162 (A) 70 - 99 mg/dl

## 2017-09-05 LAB — COMPREHENSIVE METABOLIC PANEL
ALT: 36 IU/L — AB (ref 0–32)
AST: 32 IU/L (ref 0–40)
Albumin/Globulin Ratio: 1.7 (ref 1.2–2.2)
Albumin: 5 g/dL — ABNORMAL HIGH (ref 3.6–4.8)
Alkaline Phosphatase: 72 IU/L (ref 39–117)
BUN/Creatinine Ratio: 15 (ref 12–28)
BUN: 12 mg/dL (ref 8–27)
Bilirubin Total: <0.2 mg/dL (ref 0.0–1.2)
CALCIUM: 10.7 mg/dL — AB (ref 8.7–10.3)
CO2: 19 mmol/L — AB (ref 20–29)
CREATININE: 0.78 mg/dL (ref 0.57–1.00)
Chloride: 105 mmol/L (ref 96–106)
GFR, EST AFRICAN AMERICAN: 93 mL/min/{1.73_m2} (ref >59)
GFR, EST NON AFRICAN AMERICAN: 81 mL/min/{1.73_m2} (ref >59)
GLUCOSE: 90 mg/dL (ref 65–99)
Globulin, Total: 2.9 g/dL (ref 1.5–4.5)
Potassium: 3.5 mmol/L (ref 3.5–5.2)
Sodium: 140 mmol/L (ref 134–144)
TOTAL PROTEIN: 7.9 g/dL (ref 6.0–8.5)

## 2017-09-05 LAB — LIPID PANEL
CHOL/HDL RATIO: 3.1 ratio (ref 0.0–4.4)
Cholesterol, Total: 147 mg/dL (ref 100–199)
HDL: 48 mg/dL (ref >39)
LDL CALC: 78 mg/dL (ref 0–99)
TRIGLYCERIDES: 105 mg/dL (ref 0–149)
VLDL CHOLESTEROL CAL: 21 mg/dL (ref 5–40)

## 2017-09-05 LAB — CBC
Hematocrit: 39.2 % (ref 34.0–46.6)
Hemoglobin: 13 g/dL (ref 11.1–15.9)
MCH: 27.8 pg (ref 26.6–33.0)
MCHC: 33.2 g/dL (ref 31.5–35.7)
MCV: 84 fL (ref 79–97)
PLATELETS: 394 10*3/uL (ref 150–450)
RBC: 4.67 x10E6/uL (ref 3.77–5.28)
RDW: 13.4 % (ref 12.3–15.4)
WBC: 7.5 10*3/uL (ref 3.4–10.8)

## 2017-09-05 LAB — MICROALBUMIN / CREATININE URINE RATIO
CREATININE, UR: 209.6 mg/dL
MICROALB/CREAT RATIO: 70 mg/g{creat} — AB (ref 0.0–30.0)
Microalbumin, Urine: 146.7 ug/mL

## 2017-09-05 LAB — GLUTAMIC ACID DECARBOXYLASE AUTO ABS

## 2017-09-05 LAB — ANTI-ISLET CELL ANTIBODY: Islet Cell Ab: NEGATIVE

## 2017-09-09 ENCOUNTER — Ambulatory Visit: Payer: Medicaid Other | Attending: Internal Medicine

## 2017-09-09 NOTE — Progress Notes (Signed)
Patient here for lab visit only 

## 2017-09-10 LAB — PTH, INTACT AND CALCIUM
CALCIUM: 10.4 mg/dL — AB (ref 8.7–10.3)
PTH: 22 pg/mL (ref 15–65)

## 2017-09-11 ENCOUNTER — Telehealth: Payer: Self-pay

## 2017-09-11 NOTE — Telephone Encounter (Signed)
Contacted pt to go over lab results pt didn't answer and was unable to lvm  If pt calls back please give results: calcium level has decreased some on repeat testing. However, we need to get her up to date with all of the recommended cancer screens for her age including Mammogram, colonoscopy and pap. Will address on f/u visit.

## 2017-09-17 DIAGNOSIS — Z0279 Encounter for issue of other medical certificate: Secondary | ICD-10-CM | POA: Diagnosis not present

## 2017-09-19 ENCOUNTER — Telehealth: Payer: Self-pay | Admitting: Internal Medicine

## 2017-09-19 NOTE — Telephone Encounter (Signed)
Phone call placed to patient today to discuss her FMLA form.  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  She has not worked since June 21 which was the date of her stroke.  She was not sure when she should return to work because she had so many doctors appointments.  She has seen all of the specialists in follow-up already including neurology and vascular surgery.  I think we can go ahead and release her to return to work on Monday of next week.  Patient was okay with this.

## 2017-09-23 MED FILL — LISINOPRIL 5 MG TAB: 5 | 30 days supply | Qty: 15 | Fill #1

## 2017-09-23 MED FILL — !LANTUS SOLOSTAR 100UNITS/M: 100 | 30 days supply | Qty: 6 | Fill #1

## 2017-10-14 ENCOUNTER — Institutional Professional Consult (permissible substitution): Payer: Self-pay | Admitting: Neurology

## 2017-10-15 DIAGNOSIS — Z0271 Encounter for disability determination: Secondary | ICD-10-CM

## 2017-10-25 DIAGNOSIS — Z0279 Encounter for issue of other medical certificate: Secondary | ICD-10-CM | POA: Diagnosis not present

## 2017-10-25 MED FILL — LISINOPRIL 5 MG TAB: 5 | 30 days supply | Qty: 15 | Fill #2

## 2017-10-25 MED FILL — ?CLOPIDOGREL 75MG TA: 75 | 30 days supply | Qty: 30 | Fill #1

## 2017-10-25 MED FILL — ROSUVASTATIN CALCIUM 40 MG: 40 | 30 days supply | Qty: 30 | Fill #1

## 2017-10-25 MED FILL — AMLODIPINE BESYLATE 10 MG T: 10 | 30 days supply | Qty: 30 | Fill #1

## 2017-10-25 MED FILL — $LANTUS SOLOSTAR 100 UNITS/: 100 | 75 days supply | Qty: 15 | Fill #2

## 2017-11-20 ENCOUNTER — Encounter: Payer: Self-pay | Admitting: Adult Health

## 2017-11-20 ENCOUNTER — Ambulatory Visit: Payer: Self-pay | Admitting: Adult Health

## 2017-11-20 VITALS — BP 142/73 | HR 77 | Ht 65.0 in | Wt 174.6 lb

## 2017-11-20 DIAGNOSIS — E119 Type 2 diabetes mellitus without complications: Secondary | ICD-10-CM

## 2017-11-20 DIAGNOSIS — I63512 Cerebral infarction due to unspecified occlusion or stenosis of left middle cerebral artery: Secondary | ICD-10-CM

## 2017-11-20 DIAGNOSIS — E785 Hyperlipidemia, unspecified: Secondary | ICD-10-CM

## 2017-11-20 DIAGNOSIS — I10 Essential (primary) hypertension: Secondary | ICD-10-CM

## 2017-11-20 NOTE — Progress Notes (Signed)
I agree with the above plan 

## 2017-11-20 NOTE — Progress Notes (Signed)
[facility] [address] [place]. [place] [address] [phone]       OFFICE FOLLOW UP NOTE  [name] Date of Birth:  07/27/1955 Medical Record Number:  [id number]   Reason for visit: Stroke follow up  CHIEF COMPLAINT:  Chief Complaint  Patient presents with  . Follow-up    3 month follow up. Alone. Rm 9. No concerns at this time    HPI: Sandra Shelton is being seen today in the office for left pseudo-watershed territory embolic infarct in setting of diabetic ketoacidosis and high-grade proximal left ICA stenosis on 07/08/2017. History obtained from patient and chart review. Reviewed all radiology images and labs personally.  Sandra Shelton is a 62 y.o. female with history of hypertension and obesity found down by her sister. She was admitted to the hospital for DKA (new diagnosis of DM).  CT done when RN noted right greater than left grip and showed subacute left frontal and left caudate infarcts.  MRI head reviewed and showed patchy multifocal early subacute left MCA infarcts.  MRA showed small left ICA compared to right with decreased flow in the left MCA distribution along with mild atherosclerotic changes.  Carotid Doppler was negative for significant extracranial stenosis.  2D echo showed an EF of 60 to 65%.  LDL 82 and recommended increasing Crestor from 20 to 40 mg daily.  A1c severely elevated at greater than 15.5 and upon admission for DKA glucose was greater than 800.  History of hypertension but stable during hospitalization and recommended PCP follow-up.  Patient was on aspirin 81 mg PTA and recommended DAPT with aspirin and Plavix for 3 weeks then Plavix alone.  Patient was discharged home with home health services on 07/13/2017.   Left TCAR carotid stent planned for 07/25/2017 due to left carotid stenosis.  Patient tolerated procedure well with residual 0% stenosis.  It was recommended for home health PT and patient was discharged home in stable  condition.  Patient had follow-up appointment on 08/15/2017 with Dr. Oneida Alar. Carotid duplex showed widely patent left carotid stent with minimal stenosis on right side. Recommended for repeat carotid duplex in 6 months and continuation of statin, asa and plavix indefinitely.   08/20/2017 visit: Patient is being seen today for hospital follow-up.  She states she continues to have clumsiness with her right hand at times but otherwise recovering well on her right side.  She also states she continues to have some right facial droop at the corner of her mouth and right eyelid droop.  She has completed physical therapy but states she continues to do home exercises.  She feels unsteady at times but this is also been improving.  She states she has not returned to work (works at [employer]) at this time due to daily headaches that occur mainly when she wakes up in the morning but occasionally can be in the afternoon.  These headaches last for approximately 1 hour and then resolve.  They mainly are located in the frontal portion of her head bilaterally.  She denies migrainous symptoms and denies history of migraines or headaches in the past.  She rates these headaches 6-7/10 and when asked if they have been improving since her stroke she states "they fluctuate".  She does state that she snores and has fatigue during the day on occasion but not frequently.  She states that her vision has also been slowly worsening as she is unable to see distance due to blurriness.  Patient states she  has not been to ophthalmologist in a while and is aware that she needs to make an appointment due to chronic visual problems.  Patient also has memory complaints since her stroke where at times she feels as though she is slow to remember certain things but will come to her eventually.  She continues to take aspirin and Plavix without side effects of bleeding or bruising.  Continues to take Crestor without side effects myalgias.  Blood pressure  today elevated at 150/64 but typically SBP less than 140.  She has been monitoring glucose levels at home and typically 80-120.  She currently is living at her sister's house due to frequent appointments as patient has not returned to driving but also states she does not on her own car.  Denies new or worsening stroke/TIA symptoms.  Interval history 11/20/2017: Patient is being seen today for follow-up visit.  She states overall she is been doing well with occasional clumsiness of right hand.  After prior appointment, patient was started on Topamax due to daily headaches.  She states headaches have been in control and now very rarely occur.  She continues to take both aspirin and Plavix without side effects of bleeding or bruising.  Continues to take Crestor without side effects myalgias.  PCP continues to monitor cholesterol with recent lipid panel showing LDL 78.  Does monitor glucose levels at home which have been stable.  Blood pressure today 143/73.  She is currently living on her own where she is able to maintain all ADLs independently but does have assistance by her sister for IADLs.  She does have an appointment with Dr. Brett Fairy for OSA work-up on 12/02/2017.  Follow-up appointment scheduled in 02/2017 with Dr. Oneida Alar for repeat ultrasound.  No further concerns at this time.  Denies new or worsening stroke/TIA symptoms.     ROS:   14 system review of systems performed and negative with exception of double vision, frequent waking  PMH:  Past Medical History:  Diagnosis Date  . Diabetes mellitus without complication ([place])    type 2  . Embolic stroke involving carotid artery ([place])   . GERD (gastroesophageal reflux disease)   . Heart murmur    no problems with it  . Hepatitis    in the 80's, took meds at the time   . Hypertension   . Peripheral vascular disease ([facility])    carotid artery blockage   . Stroke ([place]) 07/12/2017  . Vision abnormalities     PSH:  Past Surgical History:    Procedure Laterality Date  . COLONOSCOPY    . TRANSCAROTID ARTERY REVASCULARIZATION Left 07/24/2017   Procedure: TRANSCAROTID ARTERY REVASCULARIZATION, left;  Surgeon: Elam Dutch, MD;  Location: [facility];  Service: Vascular;  Laterality: Left;  . UTERINE FIBROID SURGERY     2005    Social History:  Social History   Socioeconomic History  . Marital status: Single    Spouse name: Not on file  . Number of children: Not on file  . Years of education: Not on file  . Highest education level: Not on file  Occupational History  . Not on file  Social Needs  . Financial resource strain: Not on file  . Food insecurity:    Worry: Not on file    Inability: Not on file  . Transportation needs:    Medical: Not on file    Non-medical: Not on file  Tobacco Use  . Smoking status: Never Smoker  . Smokeless tobacco:  Never Used  Substance and Sexual Activity  . Alcohol use: No  . Drug use: No  . Sexual activity: Not on file  Lifestyle  . Physical activity:    Days per week: Not on file    Minutes per session: Not on file  . Stress: Not on file  Relationships  . Social connections:    Talks on phone: Not on file    Gets together: Not on file    Attends religious service: Not on file    Active member of club or organization: Not on file    Attends meetings of clubs or organizations: Not on file    Relationship status: Not on file  . Intimate partner violence:    Fear of current or ex partner: Not on file    Emotionally abused: Not on file    Physically abused: Not on file    Forced sexual activity: Not on file  Other Topics Concern  . Not on file  Social History Narrative  . Not on file    Family History:  Family History  Problem Relation Age of Onset  . Diabetes Mellitus II Sister   . Stroke Mother   . Diabetes Mellitus II Mother   . Cancer Father   . Diabetes Mellitus II Brother     Medications:   Current Outpatient Medications on File Prior to Visit   Medication Sig Dispense Refill  . amLODipine (NORVASC) 10 MG tablet Take 1 tablet (10 mg total) by mouth daily. 30 tablet 6  . aspirin 81 MG tablet Take 1 tablet (81 mg total) by mouth daily. 100 tablet 2  . blood glucose meter kit and supplies KIT Dispense based on patient and insurance preference. Use up to four times daily as directed. (FOR ICD-9 250.00, 250.01). 1 each 0  . clopidogrel (PLAVIX) 75 MG tablet Take 1 tablet (75 mg total) by mouth daily. 30 tablet 6  . INSULIN SYRINGE .5CC/29G 29G X 1/2" 0.5 ML MISC For insulin injection 100 each 0  . LANTUS SOLOSTAR 100 UNIT/ML Solostar Pen Inject 10 Units into the skin 2 (two) times daily. 15 mL 6  . lisinopril (PRINIVIL,ZESTRIL) 5 MG tablet Take 0.5 tablets (2.5 mg total) by mouth daily. 30 tablet 6  . metFORMIN (GLUCOPHAGE) 500 MG tablet Take 1 tablet (500 mg total) by mouth 2 (two) times daily with a meal. 60 tablet 6  . rosuvastatin (CRESTOR) 40 MG tablet Take 1 tablet (40 mg total) by mouth daily at 6 PM. 30 tablet 6  . Syringe/Needle, Disp, (SYRINGE 3CC/27GX1-1/4") 27G X 1-1/4" 3 ML MISC For insulin injection 100 each 0  . topiramate (TOPAMAX) 25 MG tablet Take 1 tablet (25 mg total) by mouth every evening. 30 tablet 6   No current facility-administered medications on file prior to visit.     Allergies:  No Known Allergies   Physical Exam  Vitals:   11/20/17 1403  BP: (!) 142/73  Pulse: 77  Weight: 174 lb 9.6 oz (79.2 kg)  Height: 5' 5"  (1.651 m)   Body mass index is 29.05 kg/m. No exam data present  General: well developed, well nourished, pleasant middle-aged African-American female, seated, in no evident distress Head: head normocephalic and atraumatic.   Neck: supple with no carotid or supraclavicular bruits Cardiovascular: regular rate and rhythm, no murmurs Musculoskeletal: no deformity Skin:  no rash/petichiae Vascular:  Normal pulses all extremities  Neurologic Exam Mental Status: Awake and fully alert.  Oriented to place and time.  Recent and remote memory intact. Attention span, concentration and fund of knowledge appropriate. Mood and affect appropriate.  Cranial Nerves: Fundoscopic exam deferred. Pupils equal, briskly reactive to light. Extraocular movements full without nystagmus. Visual fields full to confrontation. Hearing intact. Facial sensation intact.  Mild right facial droop present. Motor: Normal bulk and tone. Normal strength in all tested extremity muscles.  Sensory.: intact to touch , pinprick , position and vibratory sensation.  Coordination: Rapid alternating movements normal in all extremities. Finger-to-nose and heel-to-shin performed accurately bilaterally.  Hand dexterity equal and right and left hand. Gait and Station: Arises from chair without difficulty. Stance is normal. Gait demonstrates normal stride length and balance . Able to heel, toe and tandem walk without difficulty.  Reflexes: 1+ and symmetric. Toes downgoing.     Diagnostic Data (Labs, Imaging, Testing)  CT head without contrast 07/08/2017 IMPRESSION: 1. 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 No hemorrhage or mass effect. 2. Mild atrophy and chronic small-vessel white matter ischemic changes. 3. No significant facial abnormality.  MRI brain without contrast MR MRA head without contrast 07/09/2017 IMPRESSION: MRI HEAD IMPRESSION:  1. Patchy multifocal acute to early subacute ischemic nonhemorrhagic left MCA territory infarcts. No significant mass effect. 2. Otherwise normal brain MRI for age.  MRA HEAD IMPRESSION:  1. Diminutive left ICA as compared to the right, with overall decreased and attenuated flow within the left MCA distribution. Finding of unknown chronicity, although further evaluation with vascular imaging of the neck is suggested to fully evaluate these findings. 2. Mild atherosclerotic change involving the intracranial circulation for  age. No proximal high-grade or correctable stenosis identified.  CT angios neck with and without contrast 07/10/2017 IMPRESSION: 1. 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 The time frame of this stenosis remains uncertain. 2. 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 3. Diminutive right vertebral artery, likely congenital.     ASSESSMENT: Sandra Shelton is a 62 y.o. year old female here with left MCA infarcts on 07/08/2017 in setting of diabetic ketoacidosis and high-grade proximal left ICA stenosis. Vascular risk factors include HLD, HTN and DM.  Patient is being seen today for follow-up visit and overall doing well without residual deficits observed during today's exam and denies new or worsening stroke/TIA symptoms.    PLAN: -Continue aspirin 81 mg daily and clopidogrel 75 mg daily  and Crestor for secondary stroke prevention -F/u with PCP regarding your HLD, HTN and DM management -f/u with Dr. Oneida Alar as scheduled along with repeat carotid Doppler in 02/2017 -Continue Topamax 25 mg nightly for headache prophylaxis -Appointment scheduled Dr. Brett Fairy 12/02/2017 for OSA work-up -Advised to continue to stay active and maintain a healthy diet -Continue to monitor blood pressure at home -Maintain strict control of hypertension with blood pressure goal below 130/90, diabetes with hemoglobin A1c goal below 6.5% and cholesterol with LDL cholesterol (bad cholesterol) goal below 70 mg/dL. I also advised the patient to eat a healthy diet with plenty of whole grains, cereals, fruits and vegetables, exercise regularly and maintain ideal body weight.  Follow up in 6 months or call earlier if needed  Greater than 50% of time during this 25 minute visit was spent on counseling,explanation of diagnosis of left MCA infarct, reviewing risk  factor management of HLD, HTN and DM, planning of further management, discussion with patient and family and coordination of  care  Venancio Poisson, AGNP-BC  [facility] [address] [place], [place] [address]  Phone [phone] Fax [phone]

## 2017-11-20 NOTE — Patient Instructions (Signed)
Continue aspirin 81 mg daily and clopidogrel 75 mg daily  and Crestor  for secondary stroke prevention  Continue to follow up with PCP regarding cholesterol, blood pressure and diabetes management   Continue topamax 25mg  nightly for headache management   continue to stay active and maintain a healthy diet  Continue to monitor blood pressure at home  Maintain strict control of hypertension with blood pressure goal below 130/90, diabetes with hemoglobin A1c goal below 6.5% and cholesterol with LDL cholesterol (bad cholesterol) goal below 70 mg/dL. I also advised the patient to eat a healthy diet with plenty of whole grains, cereals, fruits and vegetables, exercise regularly and maintain ideal body weight.  Followup in the future with me in 6 months or call earlier if needed       Thank you for coming to see [place] at [facility]. I hope we have been able to provide you high quality care today.  You may receive a patient satisfaction survey over the next few weeks. We would appreciate your feedback and comments so that we may continue to improve ourselves and the health of our patients.

## 2017-11-25 MED FILL — LISINOPRIL 5 MG TABLET: 5 | 30 days supply | Qty: 15 | Fill #3

## 2017-11-25 MED FILL — ?METFORMIN HCL 500MG TABL: 500 | 30 days supply | Qty: 60 | Fill #1

## 2017-12-02 ENCOUNTER — Institutional Professional Consult (permissible substitution): Payer: Self-pay | Admitting: Neurology

## 2017-12-09 ENCOUNTER — Encounter: Payer: Self-pay | Admitting: Internal Medicine

## 2017-12-09 ENCOUNTER — Ambulatory Visit: Payer: Medicaid Other | Attending: Internal Medicine | Admitting: Internal Medicine

## 2017-12-09 VITALS — BP 151/84 | HR 72 | Temp 98.3°F | Resp 16 | Wt 173.8 lb

## 2017-12-09 DIAGNOSIS — I10 Essential (primary) hypertension: Secondary | ICD-10-CM

## 2017-12-09 DIAGNOSIS — Z1239 Encounter for other screening for malignant neoplasm of breast: Secondary | ICD-10-CM

## 2017-12-09 DIAGNOSIS — Z794 Long term (current) use of insulin: Secondary | ICD-10-CM

## 2017-12-09 DIAGNOSIS — Z1211 Encounter for screening for malignant neoplasm of colon: Secondary | ICD-10-CM

## 2017-12-09 DIAGNOSIS — Z8673 Personal history of transient ischemic attack (TIA), and cerebral infarction without residual deficits: Secondary | ICD-10-CM

## 2017-12-09 DIAGNOSIS — E1159 Type 2 diabetes mellitus with other circulatory complications: Secondary | ICD-10-CM | POA: Diagnosis not present

## 2017-12-09 LAB — GLUCOSE, POCT (MANUAL RESULT ENTRY): POC GLUCOSE: 75 mg/dL (ref 70–99)

## 2017-12-09 LAB — POCT GLYCOSYLATED HEMOGLOBIN (HGB A1C): HbA1c POC (<> result, manual entry): 5.6 % (ref 4.0–5.6)

## 2017-12-09 MED ORDER — LISINOPRIL 5 MG PO TABS: 5.0000 mg | ORAL_TABLET | Freq: Every day | ORAL | 6 refills | Status: DC

## 2017-12-09 MED ORDER — LANTUS SOLOSTAR 100 UNIT/ML ~~LOC~~ SOPN: 7.0000 [IU] | PEN_INJECTOR | Freq: Two times a day (BID) | SUBCUTANEOUS | 6 refills | Status: DC

## 2017-12-09 MED FILL — LISINOPRIL 5 MG TAB: 5 | 30 days supply | Qty: 30 | Fill #0

## 2017-12-09 NOTE — Patient Instructions (Signed)
Decrease Lantus insulin to 7 units twice a day.  Continue to monitor your blood sugars.  Please purchase a pack of glucose tablets from over-the-counter and keep it in your purse at all times to use if your blood sugar drops low. Please remember to schedule an eye exam as soon as possible.  Your blood pressure is not at goal.  Goal is 130/80 or lower.  Increase lisinopril from 5 mg half tablet daily to full tablet daily.

## 2017-12-09 NOTE — Progress Notes (Signed)
Patient ID: Sandra Shelton, female    DOB: [birth year]  MRN: [id number]  CC: Diabetes   Subjective: Sandra Shelton is a 62 y.o. female who presents for chronic disease management 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),  LT CAS s/p TCAR (trans-carotid artery revascularization) with stent.   DM: checking BS TID before meals.  She did not bring log book. Gives range before BF 95-104, before lunch 79-100, before dinner 80-95. Reports some hypoglycemic episodes with BS in the 60-70s.  She feels hot in the head when BS low.  Drinks OJ to bring it up. -walks almost daily as the weather allows -doing okay with eating habits.  Drinks only water -endorses poor vision. Plans to get eye exam next mth  HTN:  Reports compliance with meds and limits salt Checks BP 1-2 x a day.  Gives range of 130s/70s No CP/SOB/Le edema  CVA:  Reports compliance with Crestor, ASA, Plavix.  Feels LT side still a little weak.  Saw NP for Dr. Leonie Man earlier this mth.  Note reviewed.  Has f/u with Dr. Oneida Alar in 02/2017  Hypercalcemia:  She d/c Calcium supplement.  Parathyroid hormone level was normal.  Repeat calcium level was lower.   Patient Active Problem List   Diagnosis Date Noted  . Diabetes mellitus type II, controlled ([facility]) 09/02/2017  . Essential hypertension 09/02/2017  . Left-sided carotid artery disease ([place]) 09/02/2017  . Pre-ulcerative corn or callous 09/02/2017  . Embolic stroke involving left carotid artery ([place]) 07/24/2017  . Cerebral thrombosis with cerebral infarction 07/08/2017     Current Outpatient Medications on File Prior to Visit  Medication Sig Dispense Refill  . amLODipine (NORVASC) 10 MG tablet Take 1 tablet (10 mg total) by mouth daily. 30 tablet 6  . aspirin 81 MG tablet Take 1 tablet (81 mg total) by mouth daily. 100 tablet 2  . blood glucose meter kit and supplies KIT Dispense based on patient and insurance  preference. Use up to four times daily as directed. (FOR ICD-9 250.00, 250.01). 1 each 0  . clopidogrel (PLAVIX) 75 MG tablet Take 1 tablet (75 mg total) by mouth daily. 30 tablet 6  . INSULIN SYRINGE .5CC/29G 29G X 1/2" 0.5 ML MISC For insulin injection 100 each 0  . metFORMIN (GLUCOPHAGE) 500 MG tablet Take 1 tablet (500 mg total) by mouth 2 (two) times daily with a meal. 60 tablet 6  . rosuvastatin (CRESTOR) 40 MG tablet Take 1 tablet (40 mg total) by mouth daily at 6 PM. 30 tablet 6  . Syringe/Needle, Disp, (SYRINGE 3CC/27GX1-1/4") 27G X 1-1/4" 3 ML MISC For insulin injection 100 each 0  . topiramate (TOPAMAX) 25 MG tablet Take 1 tablet (25 mg total) by mouth every evening. 30 tablet 6   No current facility-administered medications on file prior to visit.     No Known Allergies  Social History   Socioeconomic History  . Marital status: Single    Spouse name: Not on file  . Number of children: Not on file  . Years of education: Not on file  . Highest education level: Not on file  Occupational History  . Not on file  Social Needs  . Financial resource strain: Not on file  . Food insecurity:    Worry: Not on file    Inability: Not on file  . Transportation needs:    Medical: Not on file  Non-medical: Not on file  Tobacco Use  . Smoking status: Never Smoker  . Smokeless tobacco: Never Used  Substance and Sexual Activity  . Alcohol use: No  . Drug use: No  . Sexual activity: Not on file  Lifestyle  . Physical activity:    Days per week: Not on file    Minutes per session: Not on file  . Stress: Not on file  Relationships  . Social connections:    Talks on phone: Not on file    Gets together: Not on file    Attends religious service: Not on file    Active member of club or organization: Not on file    Attends meetings of clubs or organizations: Not on file    Relationship status: Not on file  . Intimate partner violence:    Fear of current or ex partner: Not on  file    Emotionally abused: Not on file    Physically abused: Not on file    Forced sexual activity: Not on file  Other Topics Concern  . Not on file  Social History Narrative  . Not on file    Family History  Problem Relation Age of Onset  . Diabetes Mellitus II Sister   . Stroke Mother   . Diabetes Mellitus II Mother   . Cancer Father   . Diabetes Mellitus II Brother     Past Surgical History:  Procedure Laterality Date  . COLONOSCOPY    . TRANSCAROTID ARTERY REVASCULARIZATION Left 07/24/2017   Procedure: TRANSCAROTID ARTERY REVASCULARIZATION, left;  Surgeon: Elam Dutch, MD;  Location: [facility] OR;  Service: Vascular;  Laterality: Left;  . UTERINE FIBROID SURGERY     2005    ROS: Review of Systems Negative except as above  PHYSICAL EXAM: BP (!) 151/84   Pulse 72   Temp 98.3 F (36.8 C) (Oral)   Resp 16   Wt 173 lb 12.8 oz (78.8 kg)   SpO2 99%   BMI 28.92 kg/m   Physical Exam BP 153/81 General appearance - alert, well appearing, and in no distress Mental status - normal mood, behavior, speech, dress, motor activity, and thought processes Neck - supple, no significant adenopathy Chest - clear to auscultation, no wheezes, rales or rhonchi, symmetric air entry Heart - normal rate, regular rhythm, normal S1, S2, no murmurs, rubs, clicks or gallops Extremities - peripheral pulses normal, no pedal edema, no clubbing or cyanosis Diabetic Foot Exam - Simple   Simple Foot Form Visual Inspection No deformities, no ulcerations, no other skin breakdown bilaterally:  Yes Sensation Testing Intact to touch and monofilament testing bilaterally:  Yes Pulse Check Posterior Tibialis and Dorsalis pulse intact bilaterally:  Yes Comments      Results for orders placed or performed in visit on 12/09/17  POCT glucose (manual entry)  Result Value Ref Range   POC Glucose 75 70 - 99 mg/dl  POCT glycosylated hemoglobin (Hb A1C)  Result Value Ref Range   Hemoglobin A1C      HbA1c POC (<> result, manual entry) 5.6 4.0 - 5.6 %   HbA1c, POC (prediabetic range)     HbA1c, POC (controlled diabetic range)     ASSESSMENT AND PLAN:  1. Controlled type 2 diabetes mellitus with other circulatory complication, with long-term current use of insulin ([place]) At goal but with episodes of hypoglycemia.  Recommend decreasing Lantus from 10 units twice a day to 7 units twice a day.  Continue to monitor blood sugars.  I went over blood sugar goals before meals and 2 hours after meals.  Continue healthy eating habits - POCT glucose (manual entry) - POCT glycosylated hemoglobin (Hb A1C) - LANTUS SOLOSTAR 100 UNIT/ML Solostar Pen; Inject 7 Units into the skin 2 (two) times daily.  Dispense: 15 mL; Refill: 6  2. Essential hypertension Not at goal.  Increase lisinopril to 5 mg daily. - lisinopril (PRINIVIL,ZESTRIL) 5 MG tablet; Take 1 tablet (5 mg total) by mouth daily.  Dispense: 90 tablet; Refill: 6  3. History of CVA (cerebrovascular accident) Continue Plavix, aspirin, blood pressure medications and Crestor.  4. Hypercalcemia Calcium level lower on repeat testing.  5. Breast cancer screening - MM Digital Screening; Future  6. Colon cancer screening Discussed colon cancer screening methods.  Patient prefers to have colonoscopy.  We will hold off on submitting a referral until she is at least 6 to 12 months out from carotid stent placement as we will need to hold the Plavix for the c-scope  Patient was given the opportunity to ask questions.  Patient verbalized understanding of the plan and was able to repeat key elements of the plan.   Orders Placed This Encounter  Procedures  . MM Digital Screening  . POCT glucose (manual entry)  . POCT glycosylated hemoglobin (Hb A1C)     Requested Prescriptions   Signed Prescriptions Disp Refills  . LANTUS SOLOSTAR 100 UNIT/ML Solostar Pen 15 mL 6    Sig: Inject 7 Units into the skin 2 (two) times daily.  Marland Kitchen lisinopril  (PRINIVIL,ZESTRIL) 5 MG tablet 90 tablet 6    Sig: Take 1 tablet (5 mg total) by mouth daily.    Return in about 6 weeks (around 01/20/2018) for PAP.  [name], MD, FACP

## 2017-12-25 MED FILL — CLOPIDOGREL 75 MG TABLET: 75 | 30 days supply | Qty: 30 | Fill #2

## 2017-12-25 MED FILL — TOPIRAMATE 25 MG TABS: 25 | 30 days supply | Qty: 30 | Fill #1

## 2017-12-25 MED FILL — LISINOPRIL 5 MG TAB: 5 | 30 days supply | Qty: 15 | Fill #4

## 2017-12-25 MED FILL — $LANTUS SOLOSTAR 100 UNITS/: 100 | 75 days supply | Qty: 15 | Fill #3

## 2017-12-25 MED FILL — ?METFORMIN HCL 500MG TABLET: 500 | 30 days supply | Qty: 60 | Fill #2

## 2017-12-25 MED FILL — AMLODIPINE BESYLATE 10 MG T: 10 | 30 days supply | Qty: 30 | Fill #2

## 2018-01-27 ENCOUNTER — Ambulatory Visit
Admission: RE | Admit: 2018-01-27 | Discharge: 2018-01-27 | Disposition: A | Discharge status: Home / Self Care | Payer: Medicare Other | Source: Ambulatory Visit | Attending: Internal Medicine | Admitting: Internal Medicine

## 2018-01-27 ENCOUNTER — Ambulatory Visit: Payer: PRIVATE HEALTH INSURANCE

## 2018-01-27 ENCOUNTER — Other Ambulatory Visit: Payer: PRIVATE HEALTH INSURANCE | Admitting: Internal Medicine

## 2018-01-27 DIAGNOSIS — Z1239 Encounter for other screening for malignant neoplasm of breast: Secondary | ICD-10-CM

## 2018-01-27 DIAGNOSIS — Z1231 Encounter for screening mammogram for malignant neoplasm of breast: Secondary | ICD-10-CM | POA: Diagnosis not present

## 2018-01-28 MED FILL — ?METFORMIN HCL 500MG TABLET: 500 | 30 days supply | Qty: 60 | Fill #3

## 2018-01-28 MED FILL — LISINOPRIL 5 MG TAB: 5 | 30 days supply | Qty: 30 | Fill #1

## 2018-01-28 MED FILL — CLOPIDOGREL 75 MG TABLET: 75 | 30 days supply | Qty: 30 | Fill #3

## 2018-01-28 MED FILL — AMLODIPINE BESYLATE 10 MG T: 10 | 30 days supply | Qty: 30 | Fill #3

## 2018-01-29 ENCOUNTER — Other Ambulatory Visit: Payer: Self-pay | Admitting: Internal Medicine

## 2018-01-29 DIAGNOSIS — R928 Other abnormal and inconclusive findings on diagnostic imaging of breast: Secondary | ICD-10-CM

## 2018-01-30 ENCOUNTER — Institutional Professional Consult (permissible substitution): Payer: Self-pay | Admitting: Neurology

## 2018-01-31 ENCOUNTER — Ambulatory Visit: Payer: Medicare Other

## 2018-01-31 ENCOUNTER — Other Ambulatory Visit: Payer: Self-pay | Admitting: Internal Medicine

## 2018-01-31 ENCOUNTER — Ambulatory Visit
Admission: RE | Admit: 2018-01-31 | Discharge: 2018-01-31 | Disposition: A | Discharge status: Home / Self Care | Payer: Medicare Other | Source: Ambulatory Visit | Attending: Internal Medicine | Admitting: Internal Medicine

## 2018-01-31 DIAGNOSIS — N6311 Unspecified lump in the right breast, upper outer quadrant: Secondary | ICD-10-CM | POA: Diagnosis not present

## 2018-01-31 DIAGNOSIS — N6310 Unspecified lump in the right breast, unspecified quadrant: Secondary | ICD-10-CM

## 2018-01-31 DIAGNOSIS — R928 Other abnormal and inconclusive findings on diagnostic imaging of breast: Secondary | ICD-10-CM

## 2018-01-31 DIAGNOSIS — N6313 Unspecified lump in the right breast, lower outer quadrant: Secondary | ICD-10-CM | POA: Diagnosis not present

## 2018-02-04 ENCOUNTER — Ambulatory Visit
Admission: RE | Admit: 2018-02-04 | Discharge: 2018-02-04 | Disposition: A | Discharge status: Home / Self Care | Payer: Medicare Other | Source: Ambulatory Visit | Attending: Internal Medicine | Admitting: Internal Medicine

## 2018-02-04 DIAGNOSIS — N6310 Unspecified lump in the right breast, unspecified quadrant: Secondary | ICD-10-CM

## 2018-02-04 DIAGNOSIS — N6315 Unspecified lump in the right breast, overlapping quadrants: Secondary | ICD-10-CM | POA: Diagnosis not present

## 2018-02-04 DIAGNOSIS — C50911 Malignant neoplasm of unspecified site of right female breast: Secondary | ICD-10-CM | POA: Diagnosis not present

## 2018-02-06 ENCOUNTER — Ambulatory Visit: Payer: Medicare Other | Attending: Internal Medicine | Admitting: Internal Medicine

## 2018-02-06 ENCOUNTER — Other Ambulatory Visit (HOSPITAL_COMMUNITY)
Admission: RE | Admit: 2018-02-06 | Discharge: 2018-02-06 | Disposition: A | Discharge status: Home / Self Care | Payer: Medicare Other | Source: Ambulatory Visit | Attending: Internal Medicine | Admitting: Internal Medicine

## 2018-02-06 VITALS — BP 124/78 | HR 85 | Temp 97.9°F | Resp 16 | Ht 65.0 in | Wt 181.6 lb

## 2018-02-06 DIAGNOSIS — I10 Essential (primary) hypertension: Secondary | ICD-10-CM | POA: Insufficient documentation

## 2018-02-06 DIAGNOSIS — I6522 Occlusion and stenosis of left carotid artery: Secondary | ICD-10-CM | POA: Diagnosis not present

## 2018-02-06 DIAGNOSIS — Z7902 Long term (current) use of antithrombotics/antiplatelets: Secondary | ICD-10-CM | POA: Insufficient documentation

## 2018-02-06 DIAGNOSIS — E119 Type 2 diabetes mellitus without complications: Secondary | ICD-10-CM | POA: Insufficient documentation

## 2018-02-06 DIAGNOSIS — Z7982 Long term (current) use of aspirin: Secondary | ICD-10-CM | POA: Insufficient documentation

## 2018-02-06 DIAGNOSIS — Z833 Family history of diabetes mellitus: Secondary | ICD-10-CM | POA: Insufficient documentation

## 2018-02-06 DIAGNOSIS — Z79899 Other long term (current) drug therapy: Secondary | ICD-10-CM | POA: Insufficient documentation

## 2018-02-06 DIAGNOSIS — D0511 Intraductal carcinoma in situ of right breast: Secondary | ICD-10-CM

## 2018-02-06 DIAGNOSIS — Z823 Family history of stroke: Secondary | ICD-10-CM | POA: Diagnosis not present

## 2018-02-06 DIAGNOSIS — Z8673 Personal history of transient ischemic attack (TIA), and cerebral infarction without residual deficits: Secondary | ICD-10-CM | POA: Diagnosis not present

## 2018-02-06 DIAGNOSIS — Z794 Long term (current) use of insulin: Secondary | ICD-10-CM | POA: Insufficient documentation

## 2018-02-06 DIAGNOSIS — Z124 Encounter for screening for malignant neoplasm of cervix: Secondary | ICD-10-CM | POA: Diagnosis not present

## 2018-02-06 DIAGNOSIS — C50111 Malignant neoplasm of central portion of right female breast: Secondary | ICD-10-CM | POA: Diagnosis not present

## 2018-02-06 NOTE — Progress Notes (Signed)
Patient ID: Sandra Shelton, female    DOB: [birth year]  MRN: [id number]  CC: Gynecologic Exam   Subjective: Sandra Shelton is a 63 y.o. female who presents for PAP Her concerns today include:   Pt is G0P0 She does not recall when she had her last Pap smear but reports no abnormal Paps in the past. She denies any vaginal discharge or itching at this time. She is not sexually active. No family history of cervical, uterine or breast cancer. 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  She has been called with results and anticipates appointment with oncology sometime next week.  HM: Due for colon cancer screening.  She prefers to have a colonoscopy but wants to hold off on referral on that until she is gone through treatment for breast cancer.   Patient Active Problem List   Diagnosis Date Noted  . Hypercalcemia 12/09/2017  . Diabetes mellitus type II, controlled ([place]) 09/02/2017  . Essential hypertension 09/02/2017  . Left-sided carotid artery disease ([place]) 09/02/2017  . Pre-ulcerative corn or callous 09/02/2017  . Embolic stroke involving left carotid artery ([place]) 07/24/2017  . Cerebral thrombosis with cerebral infarction 07/08/2017     Current Outpatient Medications on File Prior to Visit  Medication Sig Dispense Refill  . amLODipine (NORVASC) 10 MG tablet Take 1 tablet (10 mg total) by mouth daily. 30 tablet 6  . aspirin 81 MG tablet Take 1 tablet (81 mg total) by mouth daily. 100 tablet 2  . blood glucose meter kit and supplies KIT Dispense based on patient and insurance preference. Use up to four times daily as directed. (FOR ICD-9 250.00, 250.01). 1 each 0  . clopidogrel (PLAVIX) 75 MG tablet Take 1 tablet (75 mg total) by mouth daily. 30 tablet 6  . INSULIN SYRINGE .5CC/29G 29G X 1/2" 0.5 ML MISC For insulin injection 100 each 0  . LANTUS SOLOSTAR 100 UNIT/ML Solostar Pen Inject 7 Units into the skin 2 (two) times daily. 15 mL 6  .  lisinopril (PRINIVIL,ZESTRIL) 5 MG tablet Take 1 tablet (5 mg total) by mouth daily. 90 tablet 6  . metFORMIN (GLUCOPHAGE) 500 MG tablet Take 1 tablet (500 mg total) by mouth 2 (two) times daily with a meal. 60 tablet 6  . rosuvastatin (CRESTOR) 40 MG tablet Take 1 tablet (40 mg total) by mouth daily at 6 PM. 30 tablet 6  . Syringe/Needle, Disp, (SYRINGE 3CC/27GX1-1/4") 27G X 1-1/4" 3 ML MISC For insulin injection 100 each 0  . topiramate (TOPAMAX) 25 MG tablet Take 1 tablet (25 mg total) by mouth every evening. 30 tablet 6   No current facility-administered medications on file prior to visit.     No Known Allergies  Social History   Socioeconomic History  . Marital status: Single    Spouse name: Not on file  . Number of children: Not on file  . Years of education: Not on file  . Highest education level: Not on file  Occupational History  . Not on file  Social Needs  . Financial resource strain: Not on file  . Food insecurity:    Worry: Not on file    Inability: Not on file  . Transportation needs:    Medical: Not on file    Non-medical: Not on file  Tobacco Use  . Smoking status: Never Smoker  . Smokeless tobacco: Never Used  Substance and Sexual Activity  . Alcohol use: No  . Drug use: No  .  Sexual activity: Not on file  Lifestyle  . Physical activity:    Days per week: Not on file    Minutes per session: Not on file  . Stress: Not on file  Relationships  . Social connections:    Talks on phone: Not on file    Gets together: Not on file    Attends religious service: Not on file    Active member of club or organization: Not on file    Attends meetings of clubs or organizations: Not on file    Relationship status: Not on file  . Intimate partner violence:    Fear of current or ex partner: Not on file    Emotionally abused: Not on file    Physically abused: Not on file    Forced sexual activity: Not on file  Other Topics Concern  . Not on file  Social History  Narrative  . Not on file    Family History  Problem Relation Age of Onset  . Diabetes Mellitus II Sister   . Stroke Mother   . Diabetes Mellitus II Mother   . Cancer Father   . Diabetes Mellitus II Brother   . Breast cancer Neg Hx     Past Surgical History:  Procedure Laterality Date  . COLONOSCOPY    . TRANSCAROTID ARTERY REVASCULARIZATION Left 07/24/2017   Procedure: TRANSCAROTID ARTERY REVASCULARIZATION, left;  Surgeon: Elam Dutch, MD;  Location: [facility] OR;  Service: Vascular;  Laterality: Left;  . UTERINE FIBROID SURGERY     2005    ROS: Review of Systems Negative except as above. PHYSICAL EXAM: BP 124/78   Pulse 85   Temp 97.9 F (36.6 C) (Oral)   Resp 16   Ht 5' 5"  (1.651 m)   Wt 181 lb 9.6 oz (82.4 kg)   SpO2 100%   BMI 30.22 kg/m   Physical Exam  General appearance - alert, well appearing, and in no distress Mental status - normal mood, behavior, speech, dress, motor activity, and thought processes Breasts -deferred given recent mammogram and biopsy results pelvic - done by NP: Juluis Mire: normal external genitalia, vulva, vagina, cervix, uterus and adnexa   ASSESSMENT AND PLAN:  1. Pap smear for cervical cancer screening - Cytology - PAP  2. Malignant neoplasm of central portion of right female breast, unspecified estrogen receptor status ([place]) Patient tells me that she has already been called and informed of her recent biopsy results that revealed invasive ductal carcinoma.  She states that the call told her to expect a call back today or tomorrow with appointment for the oncologist.  Patient is due for colon cancer screening.  She prefers to have colonoscopy but wants to hold off on having it done until after treatment for breast cancer.    Patient was given the opportunity to ask questions.  Patient verbalized understanding of the plan and was able to repeat key elements of the plan.   No orders of the defined types were placed in this  encounter.    Requested Prescriptions    No prescriptions requested or ordered in this encounter    Return in about 3 months (around 05/08/2018).  [name], MD, FACP

## 2018-02-07 ENCOUNTER — Telehealth: Payer: Self-pay | Admitting: Hematology and Oncology

## 2018-02-07 NOTE — Telephone Encounter (Signed)
Spoke to patient to confirm afternoon [facility] appointment for 1/29, packet mailed to patient

## 2018-02-10 ENCOUNTER — Encounter: Payer: Self-pay | Admitting: *Deleted

## 2018-02-10 DIAGNOSIS — Z170 Estrogen receptor positive status [ER+]: Principal | ICD-10-CM

## 2018-02-10 DIAGNOSIS — C50411 Malignant neoplasm of upper-outer quadrant of right female breast: Secondary | ICD-10-CM

## 2018-02-10 LAB — CYTOLOGY - PAP
Chlamydia: NEGATIVE
Diagnosis: NEGATIVE
HPV: NOT DETECTED
NEISSERIA GONORRHEA: NEGATIVE
Trichomonas: NEGATIVE

## 2018-02-11 NOTE — Progress Notes (Signed)
[place] NOTE  Patient Care Team: [name], MD as PCP - General (Internal Medicine) Fanny Skates, MD as Consulting Physician (General Surgery) Nicholas Lose, MD as Consulting Physician (Hematology and Oncology) Gery Pray, MD as Consulting Physician (Radiation Oncology)  CHIEF COMPLAINTS/PURPOSE OF CONSULTATION: Newly diagnosed breast cancer  HISTORY OF PRESENTING ILLNESS:  Sandra Shelton 63 y.o. female is here because of recent diagnosis of stage 1a invasive ductal carcinoma of the right breast. The cancer was detected on a routine screening mammogram on 01/27/18 and was not palpable prior to diagnosis. A diagnostic mammogram on 01/31/18 showed a 0.8cm mass in the outer right breast with no abnormal lymph nodes and no abnormality in the left breast. A right breast biopsy on 02/04/18 showed the cancer to be grade 2 invasive ductal carcinoma with DCIS, HER2 positive, ER 100%, PR 70%, Ki67 20%. She does not have a family history of breast cancer.   She presents to the clinic alone today. She has a history of stroke and a transcarotid arterial revascularization in 07/2017. 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 She lives by herself but has family in [place].   I reviewed her records extensively and collaborated the history with the patient.  SUMMARY OF ONCOLOGIC HISTORY:   Malignant neoplasm of upper-outer quadrant of right breast in female, estrogen receptor positive ([place])   01/27/2018 Mammogram    Screening Mammogram  Possible right breast mass; asymmetry in left breast.    02/10/2018 Initial Diagnosis    Screening mammogram detected right breast mass and asymmetry in the left breast.  The left breast asymmetry resolved.  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%, HER-2 +3+ by IHC, T1BN0 stage Ia  clinical stage    MEDICAL HISTORY:  Past Medical History:  Diagnosis Date  . Diabetes mellitus without complication ([place])    type 2  . Embolic stroke involving carotid artery ([place])   . GERD (gastroesophageal reflux disease)   . Heart murmur    no problems with it  . Hepatitis    in the 80's, took meds at the time   . Hypertension   . Peripheral vascular disease ([facility])    carotid artery blockage   . Stroke ([place]) 07/12/2017  . Vision abnormalities     SURGICAL HISTORY: Past Surgical History:  Procedure Laterality Date  . COLONOSCOPY    . TRANSCAROTID ARTERY REVASCULARIZATION Left 07/24/2017   Procedure: TRANSCAROTID ARTERY REVASCULARIZATION, left;  Surgeon: Elam Dutch, MD;  Location: [facility];  Service: Vascular;  Laterality: Left;  . UTERINE FIBROID SURGERY     2005    SOCIAL HISTORY: Social History   Socioeconomic History  . Marital status: Single    Spouse name: Not on file  . Number of children: Not on file  . Years of education: Not on file  . Highest education level: Not on file  Occupational History  . Not on file  Social Needs  . Financial resource strain: Not on file  . Food insecurity:    Worry: Not on file    Inability: Not on file  . Transportation needs:    Medical: Not on file    Non-medical: Not on file  Tobacco Use  . Smoking status: Never Smoker  . Smokeless tobacco: Never Used  Substance and Sexual Activity  . Alcohol  use: No  . Drug use: No  . Sexual activity: Not on file  Lifestyle  . Physical activity:    Days per week: Not on file    Minutes per session: Not on file  . Stress: Not on file  Relationships  . Social connections:    Talks on phone: Not on file    Gets together: Not on file    Attends religious service: Not on file    Active member of club or organization: Not on file    Attends meetings of clubs or organizations: Not on file    Relationship status: Not on file  . Intimate partner violence:    Fear of current  or ex partner: Not on file    Emotionally abused: Not on file    Physically abused: Not on file    Forced sexual activity: Not on file  Other Topics Concern  . Not on file  Social History Narrative  . Not on file    FAMILY HISTORY: Family History  Problem Relation Age of Onset  . Diabetes Mellitus II Sister   . Stroke Mother   . Diabetes Mellitus II Mother   . Cancer Father   . Diabetes Mellitus II Brother   . Breast cancer Neg Hx     ALLERGIES:  has No Known Allergies.  MEDICATIONS:  Current Outpatient Medications  Medication Sig Dispense Refill  . amLODipine (NORVASC) 10 MG tablet Take 1 tablet (10 mg total) by mouth daily. 30 tablet 6  . aspirin 81 MG tablet Take 1 tablet (81 mg total) by mouth daily. 100 tablet 2  . blood glucose meter kit and supplies KIT Dispense based on patient and insurance preference. Use up to four times daily as directed. (FOR ICD-9 250.00, 250.01). 1 each 0  . clopidogrel (PLAVIX) 75 MG tablet Take 1 tablet (75 mg total) by mouth daily. 30 tablet 6  . INSULIN SYRINGE .5CC/29G 29G X 1/2" 0.5 ML MISC For insulin injection 100 each 0  . LANTUS SOLOSTAR 100 UNIT/ML Solostar Pen Inject 7 Units into the skin 2 (two) times daily. 15 mL 6  . lisinopril (PRINIVIL,ZESTRIL) 5 MG tablet Take 1 tablet (5 mg total) by mouth daily. 90 tablet 6  . metFORMIN (GLUCOPHAGE) 500 MG tablet Take 1 tablet (500 mg total) by mouth 2 (two) times daily with a meal. 60 tablet 6  . Syringe/Needle, Disp, (SYRINGE 3CC/27GX1-1/4") 27G X 1-1/4" 3 ML MISC For insulin injection 100 each 0  . topiramate (TOPAMAX) 25 MG tablet Take 1 tablet (25 mg total) by mouth every evening. 30 tablet 6   No current facility-administered medications for this visit.     REVIEW OF SYSTEMS:   Constitutional: Denies fevers, chills or abnormal night sweats Eyes: Denies blurriness of vision, double vision or watery eyes Ears, nose, mouth, throat, and face: Denies mucositis or sore throat Respiratory:  Denies cough, dyspnea or wheezes Cardiovascular: Denies palpitation, chest discomfort or lower extremity swelling Gastrointestinal:  Denies nausea, heartburn or change in bowel habits Skin: Denies abnormal skin rashes Lymphatics: Denies new lymphadenopathy or easy bruising Neurological:Denies numbness, tingling or new weaknesses Behavioral/Psych: Mood is stable, no new changes  Breast: Denies any palpable lumps or discharge All other systems were reviewed with the patient and are negative.  PHYSICAL EXAMINATION: ECOG PERFORMANCE STATUS: 1 - Symptomatic but completely ambulatory  Vitals:   02/12/18 1324  BP: (!) 163/61  Pulse: 76  Resp: 18  Temp: 98.3 F (36.8 C)  SpO2:  100%   Filed Weights   02/12/18 1324  Weight: 179 lb 6.4 oz (81.4 kg)    GENERAL:alert, no distress and comfortable SKIN: skin color, texture, turgor are normal, no rashes or significant lesions EYES: normal, conjunctiva are pink and non-injected, sclera clear OROPHARYNX:no exudate, no erythema and lips, buccal mucosa, and tongue normal  NECK: supple, thyroid normal size, non-tender, without nodularity LYMPH:  no palpable lymphadenopathy in the cervical, axillary or inguinal LUNGS: clear to auscultation and percussion with normal breathing effort HEART: regular rate & rhythm and no murmurs and no lower extremity edema ABDOMEN:abdomen soft, non-tender and normal bowel sounds Musculoskeletal:no cyanosis of digits and no clubbing  PSYCH: alert & oriented x 3 with fluent speech NEURO: no focal motor/sensory deficits  LABORATORY DATA:  I have reviewed the data as listed Lab Results  Component Value Date   WBC 7.2 02/12/2018   HGB 13.2 02/12/2018   HCT 39.7 02/12/2018   MCV 85.6 02/12/2018   PLT 341 02/12/2018   Lab Results  Component Value Date   NA 142 02/12/2018   K 4.2 02/12/2018   CL 110 02/12/2018   CO2 23 02/12/2018    RADIOGRAPHIC STUDIES: I have personally reviewed the radiological reports  and agreed with the findings in the report.  ASSESSMENT AND PLAN:  Malignant neoplasm of upper-outer quadrant of right breast in female, estrogen receptor positive ([place]) 02/04/2018:Screening mammogram detected right breast mass and asymmetry in the left breast.  The left breast asymmetry resolved.  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%, HER-2 +3+ by IHC, T1BN0 stage Ia clinical stage  Pathology and radiology counseling:Discussed with the patient, the details of pathology including the type of breast cancer,the clinical staging, the significance of ER, PR and HER-2/neu receptors and the implications for treatment. After reviewing the pathology in detail, we proceeded to discuss the different treatment options between surgery, radiation, chemotherapy, antiestrogen therapies.  Recommendations: 1. Breast conserving surgery followed by 2.  Depending on the final tumor size she may need adjuvant chemo.  If it is 5 mm or less, she did not need anti-HER-2 directed therapy. 3. Adjuvant radiation therapy followed by 4. Adjuvant antiestrogen therapy  Return to clinic after surgery to discuss final pathology report and then determine if anti-HER-2 therapy is necessary.   All questions were answered. The patient knows to call the clinic with any problems, questions or concerns.   Nicholas Lose, MD 02/12/2018   I, Cloyde Reams Dorshimer, am acting as scribe for Nicholas Lose, MD.  I have reviewed the above documentation for accuracy and completeness, and I agree with the above.

## 2018-02-12 ENCOUNTER — Other Ambulatory Visit: Payer: Self-pay | Admitting: General Surgery

## 2018-02-12 ENCOUNTER — Ambulatory Visit
Admission: RE | Admit: 2018-02-12 | Discharge: 2018-02-12 | Disposition: A | Discharge status: Home / Self Care | Payer: Medicare Other | Source: Ambulatory Visit | Attending: Radiation Oncology | Admitting: Radiation Oncology

## 2018-02-12 ENCOUNTER — Encounter: Payer: Self-pay | Admitting: Hematology and Oncology

## 2018-02-12 ENCOUNTER — Inpatient Hospital Stay: Payer: Medicare Other | Attending: Hematology and Oncology | Admitting: Hematology and Oncology

## 2018-02-12 ENCOUNTER — Encounter: Payer: Self-pay | Admitting: Physical Therapy

## 2018-02-12 ENCOUNTER — Inpatient Hospital Stay: Payer: Medicare Other

## 2018-02-12 ENCOUNTER — Other Ambulatory Visit: Payer: Self-pay

## 2018-02-12 ENCOUNTER — Ambulatory Visit: Payer: Medicare Other | Attending: General Surgery | Admitting: Physical Therapy

## 2018-02-12 DIAGNOSIS — K219 Gastro-esophageal reflux disease without esophagitis: Secondary | ICD-10-CM

## 2018-02-12 DIAGNOSIS — I10 Essential (primary) hypertension: Secondary | ICD-10-CM

## 2018-02-12 DIAGNOSIS — Z809 Family history of malignant neoplasm, unspecified: Secondary | ICD-10-CM | POA: Diagnosis not present

## 2018-02-12 DIAGNOSIS — Z8673 Personal history of transient ischemic attack (TIA), and cerebral infarction without residual deficits: Secondary | ICD-10-CM

## 2018-02-12 DIAGNOSIS — E1151 Type 2 diabetes mellitus with diabetic peripheral angiopathy without gangrene: Secondary | ICD-10-CM | POA: Diagnosis not present

## 2018-02-12 DIAGNOSIS — C50411 Malignant neoplasm of upper-outer quadrant of right female breast: Secondary | ICD-10-CM

## 2018-02-12 DIAGNOSIS — Z170 Estrogen receptor positive status [ER+]: Principal | ICD-10-CM

## 2018-02-12 DIAGNOSIS — Z794 Long term (current) use of insulin: Secondary | ICD-10-CM | POA: Diagnosis not present

## 2018-02-12 DIAGNOSIS — M6281 Muscle weakness (generalized): Secondary | ICD-10-CM | POA: Diagnosis not present

## 2018-02-12 DIAGNOSIS — Z7902 Long term (current) use of antithrombotics/antiplatelets: Secondary | ICD-10-CM

## 2018-02-12 DIAGNOSIS — Z7982 Long term (current) use of aspirin: Secondary | ICD-10-CM

## 2018-02-12 DIAGNOSIS — E119 Type 2 diabetes mellitus without complications: Secondary | ICD-10-CM | POA: Diagnosis not present

## 2018-02-12 DIAGNOSIS — Z79899 Other long term (current) drug therapy: Secondary | ICD-10-CM | POA: Diagnosis not present

## 2018-02-12 DIAGNOSIS — Z7901 Long term (current) use of anticoagulants: Secondary | ICD-10-CM | POA: Diagnosis not present

## 2018-02-12 DIAGNOSIS — R293 Abnormal posture: Secondary | ICD-10-CM | POA: Diagnosis not present

## 2018-02-12 LAB — CMP (CANCER CENTER ONLY)
ALT: 22 U/L (ref 0–44)
AST: 17 U/L (ref 15–41)
Albumin: 3.9 g/dL (ref 3.5–5.0)
Alkaline Phosphatase: 60 U/L (ref 38–126)
Anion gap: 9 (ref 5–15)
BUN: 17 mg/dL (ref 8–23)
CO2: 23 mmol/L (ref 22–32)
Calcium: 9.8 mg/dL (ref 8.9–10.3)
Chloride: 110 mmol/L (ref 98–111)
Creatinine: 0.96 mg/dL (ref 0.44–1.00)
GFR, Est AFR Am: >60 mL/min (ref >60)
GFR, Estimated: >60 mL/min (ref >60)
Glucose, Bld: 85 mg/dL (ref 70–99)
Potassium: 4.2 mmol/L (ref 3.5–5.1)
Sodium: 142 mmol/L (ref 135–145)
Total Bilirubin: 0.3 mg/dL (ref 0.3–1.2)
Total Protein: 7.7 g/dL (ref 6.5–8.1)

## 2018-02-12 LAB — CBC WITH DIFFERENTIAL (CANCER CENTER ONLY)
Abs Immature Granulocytes: 0.02 10*3/uL (ref 0.00–0.07)
Basophils Absolute: 0 10*3/uL (ref 0.0–0.1)
Basophils Relative: 0 %
Eosinophils Absolute: 0.1 10*3/uL (ref 0.0–0.5)
Eosinophils Relative: 1 %
HCT: 39.7 % (ref 36.0–46.0)
Hemoglobin: 13.2 g/dL (ref 12.0–15.0)
Immature Granulocytes: 0 %
LYMPHS ABS: 2.7 10*3/uL (ref 0.7–4.0)
Lymphocytes Relative: 38 %
MCH: 28.4 pg (ref 26.0–34.0)
MCHC: 33.2 g/dL (ref 30.0–36.0)
MCV: 85.6 fL (ref 80.0–100.0)
Monocytes Absolute: 0.6 10*3/uL (ref 0.1–1.0)
Monocytes Relative: 8 %
Neutro Abs: 3.8 10*3/uL (ref 1.7–7.7)
Neutrophils Relative %: 53 %
Platelet Count: 341 10*3/uL (ref 150–400)
RBC: 4.64 MIL/uL (ref 3.87–5.11)
RDW: 13.5 % (ref 11.5–15.5)
WBC Count: 7.2 10*3/uL (ref 4.0–10.5)
nRBC: 0 % (ref 0.0–0.2)

## 2018-02-12 NOTE — Patient Instructions (Signed)

## 2018-02-12 NOTE — Progress Notes (Signed)
Radiation Oncology         [phone] ________________________________  Multidisciplinary Breast Oncology Clinic [facility]) Initial Outpatient Consultation  Name: Sandra Shelton MRN: [id number]  Date: 02/12/2018  DOB: [birth year]  KM:MNOTRRN, Dalbert Batman, MD  Fanny Skates, MD   REFERRING PHYSICIAN: Fanny Skates, MD  DIAGNOSIS: The encounter diagnosis was Malignant neoplasm of upper-outer quadrant of right breast in female, estrogen receptor positive ([place]).  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 Malignant neoplasm of upper-outer quadrant of right breast in female, estrogen receptor positive ([place]) C50.411    Z17.0     HISTORY OF PRESENT ILLNESS::Sandra Shelton is a 63 y.o. female who is presenting to the office today for evaluation of her newly diagnosed breast cancer. She is doing well overall.   She had routine screening mammography on January 13 showing a possible abnormality in both breast. She underwent bilateral diagnostic mammography with tomography and right breast ultrasonography at [facility] on January 17 showing: suspicious 0.8 cm mass within the outer right breast. No abnormal appearing right axillary lymph nodes. No persistent suspicious abnormality in the area of the left breast screening study finding.  Biopsy on January 21 showed: invasive ductal carcinoma, grade II, as well as DCIS. Prognostic indicators significant for: estrogen receptor, 100% positive and progesterone receptor, 70% positive, both with strong staining intensity. Proliferation marker Ki67 at 20%. HER2 positive, but of note, the tumor shows heterogeneity in regards to Her2 expression.  Menarche: not provided Age at first live birth: N/A GP: GxPx LMP: not provided Contraceptive: yes HRT: no   The patient was referred today for presentation in the multidisciplinary conference.  Radiology studies and pathology slides were  presented there for review and discussion of treatment options.  A consensus was discussed regarding potential next steps.  PREVIOUS RADIATION THERAPY: No  PAST MEDICAL HISTORY:  has a past medical history of Diabetes mellitus without complication ([place]), Embolic stroke involving carotid artery ([place]), GERD (gastroesophageal reflux disease), Heart murmur, Hepatitis, Hypertension, Peripheral vascular disease ([place]), Stroke ([place]) (07/12/2017), and Vision abnormalities.    PAST SURGICAL HISTORY: Past Surgical History:  Procedure Laterality Date  . COLONOSCOPY    . TRANSCAROTID ARTERY REVASCULARIZATION Left 07/24/2017   Procedure: TRANSCAROTID ARTERY REVASCULARIZATION, left;  Surgeon: Elam Dutch, MD;  Location: [facility] OR;  Service: Vascular;  Laterality: Left;  . UTERINE FIBROID SURGERY     2005    FAMILY HISTORY: family history includes Cancer in her father; Diabetes Mellitus II in her brother, mother, and sister; Stroke in her mother.  SOCIAL HISTORY:  reports that she has never smoked. She has never used smokeless tobacco. She reports that she does not drink alcohol or use drugs.  ALLERGIES: Patient has no known allergies.  MEDICATIONS:  Current Outpatient Medications  Medication Sig Dispense Refill  . amLODipine (NORVASC) 10 MG tablet Take 1 tablet (10 mg total) by mouth daily. 30 tablet 6  . aspirin 81 MG tablet Take 1 tablet (81 mg total) by mouth daily. 100 tablet 2  . blood glucose meter kit and supplies KIT Dispense based on patient and insurance preference. Use up to four times daily as directed. (FOR ICD-9 250.00, 250.01). 1 each 0  . clopidogrel (PLAVIX) 75 MG tablet Take 1 tablet (75 mg total) by mouth daily. 30 tablet 6  . INSULIN SYRINGE .5CC/29G 29G X 1/2" 0.5 ML MISC For insulin injection 100 each  0  . LANTUS SOLOSTAR 100 UNIT/ML Solostar Pen Inject 7 Units into the skin 2 (two) times daily. 15 mL 6  . lisinopril (PRINIVIL,ZESTRIL) 5 MG tablet Take 1 tablet (5 mg total)  by mouth daily. 90 tablet 6  . metFORMIN (GLUCOPHAGE) 500 MG tablet Take 1 tablet (500 mg total) by mouth 2 (two) times daily with a meal. 60 tablet 6  . Syringe/Needle, Disp, (SYRINGE 3CC/27GX1-1/4") 27G X 1-1/4" 3 ML MISC For insulin injection 100 each 0  . topiramate (TOPAMAX) 25 MG tablet Take 1 tablet (25 mg total) by mouth every evening. 30 tablet 6   No current facility-administered medications for this encounter.     REVIEW OF SYSTEMS:  REVIEW OF SYSTEMS: A 10+ POINT REVIEW OF SYSTEMS WAS OBTAINED including neurology, dermatology, psychiatry, cardiac, respiratory, lymph, extremities, GI, GU, musculoskeletal, constitutional, reproductive, HEENT.On the provided form, she reports wearing glasses and having diabetes. She denies any other symptoms.    PHYSICAL EXAM:  Vitals with BMI 02/12/2018  Height 5' 5"   Weight 179 lbs 6 oz  BMI 00.93  Systolic 818  Diastolic 61  Pulse 76  Respirations 18   Lungs are clear to auscultation bilaterally. Heart has regular rate and rhythm. No palpable cervical, supraclavicular, or axillary adenopathy. Abdomen soft, non-tender, normal bowel sounds. Breast: Left breast is large and pendulous with no palpable mass, nipple discharge, or bleeding. Right breast with significant amount of bruising and swelling from her biopsy in the lateral aspect. No nipple discharge or bleeding.   ECOG = 1  0 - Asymptomatic (Fully active, able to carry on all predisease activities without restriction)  1 - Symptomatic but completely ambulatory (Restricted in physically strenuous activity but ambulatory and able to carry out work of a light or sedentary nature. For example, light housework, office work)  2 - Symptomatic, <50% in bed during the day (Ambulatory and capable of all self care but unable to carry out any work activities. Up and about more than 50% of waking hours)  3 - Symptomatic, >50% in bed, but not bedbound (Capable of only limited self-care, confined to bed  or chair 50% or more of waking hours)  4 - Bedbound (Completely disabled. Cannot carry on any self-care. Totally confined to bed or chair)  5 - Death   Eustace Pen MM, Creech RH, Tormey DC, et al. [phone]). "Toxicity and response criteria of the [facility]". [place] Oncol. 5 (6): 649-55  LABORATORY DATA:  Lab Results  Component Value Date   WBC 7.2 02/12/2018   HGB 13.2 02/12/2018   HCT 39.7 02/12/2018   MCV 85.6 02/12/2018   PLT 341 02/12/2018   Lab Results  Component Value Date   NA 142 02/12/2018   K 4.2 02/12/2018   CL 110 02/12/2018   CO2 23 02/12/2018   Lab Results  Component Value Date   ALT 22 02/12/2018   AST 17 02/12/2018   ALKPHOS 60 02/12/2018   BILITOT 0.3 02/12/2018    PULMONARY FUNCTION TEST:   Recent Review Flowsheet Data    There is no flowsheet data to display.      RADIOGRAPHY: US Breast Ltd Uni Right Inc Axilla  Result Date: 01/31/2018 CLINICAL DATA:  63 year old female for further evaluation of RIGHT breast mass and LEFT breast asymmetry on screening mammogram. EXAM: DIGITAL DIAGNOSTIC BILATERAL MAMMOGRAM WITH CAD AND TOMO ULTRASOUND RIGHT BREAST COMPARISON:  Previous exam(s). ACR Breast Density Category b: There are scattered areas of fibroglandular density. FINDINGS:  2D/3D full field and spot compression views of the breasts demonstrate a persistent irregular mass within the UPPER-OUTER RIGHT breast. No persistent suspicious mammographic abnormalities identified in the area of the LEFT breast screening study finding. Mammographic images were processed with CAD. Targeted ultrasound is performed, showing a 0.6 x 0.6 x 0.8 cm irregular hypoechoic mass containing a calcification at the 9 o'clock position of the RIGHT breast 2 cm from the nipple. No abnormal RIGHT axillary lymph nodes are identified. IMPRESSION: Suspicious 0.8 cm mass within the OUTER RIGHT breast. Tissue sampling is recommended. No abnormal appearing RIGHT axillary lymph  nodes. No persistent suspicious abnormality in the area of the LEFT breast screening study finding. RECOMMENDATION: Ultrasound-guided RIGHT breast biopsy, which will be arranged. I have discussed the findings and recommendations with the patient. Results were also provided in writing at the conclusion of the visit. If applicable, a reminder letter will be sent to the patient regarding the next appointment. BI-RADS CATEGORY  4: Suspicious. Electronically Signed   By: Margarette Canada M.D.   On: 01/31/2018 10:34   Mm Diag Breast Tomo Bilateral  Result Date: 01/31/2018 CLINICAL DATA:  63 year old female for further evaluation of RIGHT breast mass and LEFT breast asymmetry on screening mammogram. EXAM: DIGITAL DIAGNOSTIC BILATERAL MAMMOGRAM WITH CAD AND TOMO ULTRASOUND RIGHT BREAST COMPARISON:  Previous exam(s). ACR Breast Density Category b: There are scattered areas of fibroglandular density. FINDINGS: 2D/3D full field and spot compression views of the breasts demonstrate a persistent irregular mass within the UPPER-OUTER RIGHT breast. No persistent suspicious mammographic abnormalities identified in the area of the LEFT breast screening study finding. Mammographic images were processed with CAD. Targeted ultrasound is performed, showing a 0.6 x 0.6 x 0.8 cm irregular hypoechoic mass containing a calcification at the 9 o'clock position of the RIGHT breast 2 cm from the nipple. No abnormal RIGHT axillary lymph nodes are identified. IMPRESSION: Suspicious 0.8 cm mass within the OUTER RIGHT breast. Tissue sampling is recommended. No abnormal appearing RIGHT axillary lymph nodes. No persistent suspicious abnormality in the area of the LEFT breast screening study finding. RECOMMENDATION: Ultrasound-guided RIGHT breast biopsy, which will be arranged. I have discussed the findings and recommendations with the patient. Results were also provided in writing at the conclusion of the visit. If applicable, a reminder letter will  be sent to the patient regarding the next appointment. BI-RADS CATEGORY  4: Suspicious. Electronically Signed   By: Margarette Canada M.D.   On: 01/31/2018 10:34   Mm 3d Screen Breast Bilateral  Result Date: 01/28/2018 CLINICAL DATA:  Screening. EXAM: DIGITAL SCREENING BILATERAL MAMMOGRAM WITH TOMO AND CAD COMPARISON:  Previous exam(s). ACR Breast Density Category b: There are scattered areas of fibroglandular density. FINDINGS: In the right breast, a possible mass warrants further evaluation. In the left breast, an asymmetry seen on the MLO view only warrants further evaluation. Images were processed with CAD. IMPRESSION: Further evaluation is suggested for possible mass in the right breast and possible asymmetry in the left breast. RECOMMENDATION: Diagnostic mammogram and possibly ultrasound of the bilateral breasts. (Code:[id number]) The patient will be contacted regarding the findings, and additional imaging will be scheduled. BI-RADS CATEGORY  0: Incomplete. Need additional imaging evaluation and/or prior mammograms for comparison. Electronically Signed   By: Everlean Alstrom M.D.   On: 01/28/2018 09:26   Mm Clip Placement Right  Result Date: 02/04/2018 CLINICAL DATA:  63 year old female status post ultrasound-guided biopsy of a right breast mass. EXAM: DIAGNOSTIC RIGHT MAMMOGRAM POST ULTRASOUND BIOPSY COMPARISON:  Previous exam(s). FINDINGS: Mammographic images were obtained following ultrasound guided biopsy of a suspicious right breast mass. Ribbon shaped clip is identified in the central lateral right breast at middle depth. IMPRESSION: Post biopsy clip in the expected location. Final Assessment: Post Procedure Mammograms for Marker Placement Electronically Signed   By: Kristopher Oppenheim M.D.   On: 02/04/2018 14:14   [place] Rt Breast Bx W Loc Dev 1st Lesion Img Bx Spec US Guide  Addendum Date: 02/06/2018   ADDENDUM REPORT: 02/06/2018 08:22 ADDENDUM: Pathology revealed GRADE II/III INVASIVE DUCTAL CARCINOMA,  DUCTAL CARCINOMA IN SITU of the Right breast. This was found to be concordant by Dr. Kristopher Oppenheim. Pathology results were discussed with the patient by telephone. The patient reported doing well after the biopsy with tenderness at the site. Post biopsy instructions and care were reviewed and questions were answered. The patient was encouraged to call [facility] for any additional concerns. The patient was referred to [facility] on February 12, 2018. Pathology results reported by Terie Purser, RN on 02/06/2018. Electronically Signed   By: Kristopher Oppenheim M.D.   On: 02/06/2018 08:22   Result Date: 02/06/2018 CLINICAL DATA:  63 year old female with a suspicious right breast mass. EXAM: ULTRASOUND GUIDED RIGHT BREAST CORE NEEDLE BIOPSY COMPARISON:  Previous exam(s). FINDINGS: I met with the patient and we discussed the procedure of ultrasound-guided biopsy, including benefits and alternatives. We discussed the high likelihood of a successful procedure. We discussed the risks of the procedure, including infection, bleeding, tissue injury, clip migration, and inadequate sampling. Informed written consent was given. The usual time-out protocol was performed immediately prior to the procedure. Lesion quadrant: Upper outer quadrant Using sterile technique and 1% Lidocaine as local anesthetic, under direct ultrasound visualization, a 12 gauge spring-loaded device was used to perform biopsy of a right breast mass at the 9 o'clock position using a lateral approach. At the conclusion of the procedure a ribbon shaped tissue marker clip was deployed into the biopsy cavity. Follow up 2 view mammogram was performed and dictated separately. IMPRESSION: Ultrasound guided biopsy of a suspicious right breast mass. No apparent complications. Electronically Signed: By: Kristopher Oppenheim M.D. On: 02/04/2018 14:08      IMPRESSION: Stage  IA (cT1b, cN0)  Her2+, ER/PR+, invasive ductal carcinoma of the right breast, grade II    Patient will be a good candidate for breast conservation with radiotherapy directed at the right breast. Patient is interested in breast conserving surgery rather than mastectomy.    Today, I talked to the patient about the findings and work-up thus far.  We discussed the natural history of breast cancer and general treatment, highlighting the role of radiotherapy in the management.  We discussed the available radiation techniques, and focused on the details of logistics and delivery.  We reviewed the anticipated acute and late sequelae associated with radiation in this setting.  The patient was encouraged to ask questions that I answered to the best of my ability.  A patient consent form was discussed and signed.  We retained a copy for our records.  The patient would like to proceed with radiation and will be scheduled for CT simulation.    PLAN:  1. Right lumpectomy with SLN 2. Possible adjuvant chemotherapy if her tumor is greater than 6 mm 3. External radiation therapy 4. Aromatase inhibitor   ------------------------------------------------  Blair Promise, PhD, MD This document serves as a  record of services personally performed by Gery Pray, MD. It was created on his behalf by Mary-Margaret Loma Messing, a trained medical scribe. The creation of this record is based on the scribe's personal observations and the provider's statements to them. This document has been checked and approved by the attending provider.

## 2018-02-12 NOTE — Progress Notes (Signed)
[place] Psychosocial Distress Screening Spiritual Care  Met with Sandra Shelton in [facility] to introduce [facility] team/resources, reviewing distress screen per protocol.  The patient scored a 1 on the Psychosocial Distress Thermometer which indicates mild distress. Also assessed for distress and other psychosocial needs.   ONCBCN DISTRESS SCREENING 02/12/2018  Screening Type Initial Screening  Distress experienced in past week (1-10) 1  Referral to support programs Yes   The pt needed to leave prior to meeting with the counselor, so the pt was given a packet of information about Patient and [facility] resources. The pt reported they will reach out if any support program needs arise.  Follow up needed: No.  Doris Cheadle, Counseling Intern [phone]

## 2018-02-12 NOTE — Assessment & Plan Note (Signed)
02/04/2018:Screening mammogram detected right breast mass and asymmetry in the left breast.  The left breast asymmetry resolved.  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%, HER-2 +3+ by IHC, T1BN0 stage Ia clinical stage  Pathology and radiology counseling:Discussed with the patient, the details of pathology including the type of breast cancer,the clinical staging, the significance of ER, PR and HER-2/neu receptors and the implications for treatment. After reviewing the pathology in detail, we proceeded to discuss the different treatment options between surgery, radiation, chemotherapy, antiestrogen therapies.  Recommendations: 1. Breast conserving surgery followed by 2.  Depending on the final tumor size she may need adjuvant chemo.  If it is less than 5 mm she did not need anti-HER-2 directed therapy. 3. Adjuvant radiation therapy followed by 4. Adjuvant antiestrogen therapy  Return to clinic after surgery to discuss final pathology report and then determine if anti-HER-2 therapy is necessary.

## 2018-02-12 NOTE — Progress Notes (Signed)
Nutrition Assessment  Reason for Assessment:  Pt seen in Breast Clinic  ASSESSMENT:   63 year old female with new diagnosis of breast cancer.  Past medical reviewed and noted DM, HTN.    Reports normal appetite.  Medications:  reviewed  Labs: reviewed  Anthropometrics:   Height: 65 inches Weight: 179 lb 6.4 oz BMI: 29   NUTRITION DIAGNOSIS: Food and nutrition related knowledge deficit related to new diagnosis of breast cancer as evidenced by no prior need for nutrition related information.  INTERVENTION:   Discussed and provided packet of information regarding nutritional tips for breast cancer patients.  Questions answered.  Teachback method used.  Contact information provided and patient knows to contact me with questions/concerns.    MONITORING, EVALUATION, and GOAL: Pt will consume a healthy plant based diet to maintain lean body mass throughout treatment.   Sandra Shelton B. Zenia Resides, [place], [place] Registered Dietitian [phone] (pager)

## 2018-02-12 NOTE — Therapy (Signed)
[place], [place], [address] Phone: [phone]   Fax:  [phone]  Physical Therapy Evaluation  Patient Details  Name: Sandra Shelton MRN: [id number] Date of Birth: 01-06-1956 Referring Provider (PT): Dr. Fanny Skates   Encounter Date: 02/12/2018  PT End of Session - 02/12/18 1544    Visit Number  1    Number of Visits  2    Date for PT Re-Evaluation  04/09/18    PT Start Time  9977    PT Stop Time  4142   Also saw pt from 1425-1438 for a total of 28 minutes   PT Time Calculation (min)  15 min    Activity Tolerance  Patient tolerated treatment well    Behavior During Therapy  [facility] for tasks assessed/performed       Past Medical History:  Diagnosis Date  . Diabetes mellitus without complication ([place])    type 2  . Embolic stroke involving carotid artery ([place])   . GERD (gastroesophageal reflux disease)   . Heart murmur    no problems with it  . Hepatitis    in the 80's, took meds at the time   . Hypertension   . Peripheral vascular disease (HCC)    carotid artery blockage   . Stroke ([place]) 07/12/2017  . Vision abnormalities     Past Surgical History:  Procedure Laterality Date  . COLONOSCOPY    . TRANSCAROTID ARTERY REVASCULARIZATION Left 07/24/2017   Procedure: TRANSCAROTID ARTERY REVASCULARIZATION, left;  Surgeon: Elam Dutch, MD;  Location: [place];  Service: Vascular;  Laterality: Left;  . UTERINE FIBROID SURGERY     2005    There were no vitals filed for this visit.   Subjective Assessment - 02/12/18 1534    Subjective  Patient reports she is here today to be seen by her medical team for her newly diagnosed right breast cancer.    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 It measures 8 mm and is located in the upper outer quadrant. It is Triple positive with a Ki67 of 20%. She had a CVA  in 06/2017 and is still recovering from that with right arm strength and balance. She also has insulin dependent diabetes.     Patient Stated Goals  Reduce lymphedema risk and learn post op shoulder ROM HEP    Currently in Pain?  No/denies         [facility] PT Assessment - 02/12/18 0001      Assessment   Medical Diagnosis  Right breast cancer    Referring Provider (PT)  Dr. Fanny Skates    Onset Date/Surgical Date  01/27/18    Hand Dominance  Right    Prior Therapy  Recent completion of PT for CVA      Precautions   Precautions  Other (comment)    Precaution Comments  active cancer; recent CVA; IDDM      Restrictions   Weight Bearing Restrictions  No      Balance Screen   Has the patient fallen in the past 6 months  No    Has the patient had a decrease in activity level because of a fear of falling?   No    Is the patient reluctant to leave their home because of a fear of falling?   No      Home Film/video editor residence  Living Arrangements  Alone    Available Help at Discharge  Family      Prior Function   Level of Independence  Independent    Vocation  On disability    Leisure  Walks 30 min (slowly) daily      Cognition   Overall Cognitive Status  Within Functional Limits for tasks assessed      Posture/Postural Control   Posture/Postural Control  Postural limitations    Postural Limitations  Rounded Shoulders;Forward head      ROM / Strength   AROM / PROM / Strength  AROM;Strength      AROM   AROM Assessment Site  Shoulder;Cervical    Right/Left Shoulder  Right;Left    Right Shoulder Extension  45 Degrees    Right Shoulder Flexion  132 Degrees    Right Shoulder ABduction  141 Degrees    Right Shoulder Internal Rotation  63 Degrees    Right Shoulder External Rotation  73 Degrees    Left Shoulder Extension  38 Degrees    Left Shoulder Flexion  123 Degrees    Left Shoulder ABduction  134 Degrees    Left Shoulder Internal Rotation   62 Degrees    Left Shoulder External Rotation  69 Degrees    Cervical Flexion  WNL    Cervical Extension  WNL    Cervical - Right Side Bend  WNL    Cervical - Left Side Bend  WNL    Cervical - Right Rotation  WNL    Cervical - Left Rotation  WNL        LYMPHEDEMA/ONCOLOGY QUESTIONNAIRE - 02/12/18 1541      Type   Cancer Type  Right breast cancer      Lymphedema Assessments   Lymphedema Assessments  Upper extremities      Right Upper Extremity Lymphedema   10 cm Proximal to Olecranon Process  29.5 cm    Olecranon Process  26.1 cm    10 cm Proximal to Ulnar Styloid Process  22.9 cm    Just Proximal to Ulnar Styloid Process  15.8 cm    Across Hand at [employer]  18 cm    At [place] of 2nd Digit  5.8 cm      Left Upper Extremity Lymphedema   10 cm Proximal to Olecranon Process  30.9 cm    Olecranon Process  26.6 cm    10 cm Proximal to Ulnar Styloid Process  21.9 cm    Just Proximal to Ulnar Styloid Process  16 cm    Across Hand at [employer]  18 cm    At [place] of 2nd Digit  5.6 cm             Objective measurements completed on examination: See above findings.        Patient was instructed today in a home exercise program today for post op shoulder range of motion. These included active assist shoulder flexion in sitting, scapular retraction, wall walking with shoulder abduction, and hands behind head external rotation.  She was encouraged to do these twice a day, holding 3 seconds and repeating 5 times when permitted by her physician.          PT Education - 02/12/18 1543    Education Details  Lymphedema risk reduction and post op shoulder ROM HEP    Person(s) Educated  Patient    Methods  Explanation;Demonstration;Handout    Comprehension  Returned demonstration;Verbalized understanding  PT Long Term Goals - 02/12/18 1547      PT LONG TERM GOAL #1   Title  Patient will demonstrate she has regained shoulder ROM and function post  operatively compared to baseline measurements.    Time  [facility] Goals - 02/12/18 1547      Patient will be able to verbalize understanding of pertinent lymphedema risk reduction practices relevant to her diagnosis specifically related to skin care.   Time  1    Status  Achieved      Patient will be able to return demonstrate and/or verbalize understanding of the post-op home exercise program related to regaining shoulder range of motion.   Time  1    Period  Days    Status  Achieved      Patient will be able to verbalize understanding of the importance of attending the postoperative After Breast Cancer Class for further lymphedema risk reduction education and therapeutic exercise.   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 It measures 8 mm and is located in the upper outer quadrant. It is Triple positive with a Ki67 of 20%. She had a CVA in 06/2017 and is still recovering from that with right arm strength and balance. She also has insulin dependent diabetes. Her multidisciplinary medical team met prior to her assessments to determine a recommended treatment plan. She is planning to have a right lumpectomy and sentinel node biopsy followed by possible chemo if the mass is > 6 mm after geting an Oncotype test; radiation and anti-estrogen therapy. She will benefit from a post op PT reassessment to determine needs.    History and Personal Factors relevant to plan of care:  Hx CVA in 2019; lives alone    Clinical Presentation  Stable    Clinical Decision Making  Low    Rehab Potential  Excellent    Clinical Impairments Affecting Rehab Potential  None    PT Frequency  --   Eval and 1 f/u visit   PT Treatment/Interventions  ADLs/Self Care Home Management;Therapeutic  exercise;Patient/family education    PT Next Visit Plan  Will reassess 3-4 weeks post op    PT Home Exercise Plan  Post op shoulder ROM HEP    Consulted and Agree with Plan of Care  Patient       Patient will benefit from skilled therapeutic intervention in order to improve the following deficits and impairments:  Postural dysfunction, Decreased knowledge of precautions, Pain, Impaired UE functional use, Decreased range of motion  Visit Diagnosis: Malignant neoplasm of upper-outer quadrant of right breast in female, estrogen receptor positive ([place]) - Plan: PT plan of care cert/re-cert  Muscle weakness (generalized) - Plan: PT plan of care cert/re-cert  Abnormal posture - Plan: PT plan of care cert/re-cert   Patient will follow up at outpatient cancer rehab 3-4 weeks following surgery.  If the patient requires physical therapy at that time, a specific plan will be dictated and sent to the referring physician for approval. The patient was educated today on appropriate basic range of motion exercises to begin post operatively and the importance of attending the  After Breast Cancer class following surgery.  Patient was educated today on lymphedema risk reduction practices as it pertains to recommendations that will benefit the patient immediately following surgery.  She verbalized good understanding.      Problem List Patient Active Problem List   Diagnosis Date Noted  . Malignant neoplasm of upper-outer quadrant of right breast in female, estrogen receptor positive ([place]) 02/10/2018  . Malignant neoplasm of central portion of right female breast ([place]) 02/06/2018  . Hypercalcemia 12/09/2017  . Diabetes mellitus type II, controlled ([place]) 09/02/2017  . Essential hypertension 09/02/2017  . Left-sided carotid artery disease ([place]) 09/02/2017  . Pre-ulcerative corn or callous 09/02/2017  . Embolic stroke involving left carotid artery ([place]) 07/24/2017  . Cerebral thrombosis with cerebral  infarction 07/08/2017   Annia Friendly, PT 02/12/18 3:50 PM  [place], [place], [address] Phone: [phone]   Fax:  [phone]  Name: Sandra Shelton MRN: [id number] Date of Birth: [birth year]

## 2018-02-13 ENCOUNTER — Telehealth: Payer: Self-pay | Admitting: Hematology and Oncology

## 2018-02-13 NOTE — Telephone Encounter (Signed)
No los °

## 2018-02-17 ENCOUNTER — Telehealth: Payer: Self-pay

## 2018-02-17 NOTE — Telephone Encounter (Signed)
Contacted pt to go over pap results pt is aware and doesn't have any questions or concerns  

## 2018-02-18 ENCOUNTER — Ambulatory Visit (INDEPENDENT_AMBULATORY_CARE_PROVIDER_SITE_OTHER): Payer: Medicare Other | Admitting: Neurology

## 2018-02-18 ENCOUNTER — Encounter: Payer: Self-pay | Admitting: Neurology

## 2018-02-18 VITALS — BP 138/72 | HR 83 | Ht 65.0 in | Wt 178.0 lb

## 2018-02-18 DIAGNOSIS — E113299 Type 2 diabetes mellitus with mild nonproliferative diabetic retinopathy without macular edema, unspecified eye: Secondary | ICD-10-CM

## 2018-02-18 DIAGNOSIS — I6330 Cerebral infarction due to thrombosis of unspecified cerebral artery: Secondary | ICD-10-CM

## 2018-02-18 DIAGNOSIS — I63132 Cerebral infarction due to embolism of left carotid artery: Secondary | ICD-10-CM | POA: Diagnosis not present

## 2018-02-18 DIAGNOSIS — G4730 Sleep apnea, unspecified: Secondary | ICD-10-CM

## 2018-02-18 DIAGNOSIS — E663 Overweight: Secondary | ICD-10-CM | POA: Diagnosis not present

## 2018-02-18 DIAGNOSIS — R0683 Snoring: Secondary | ICD-10-CM

## 2018-02-18 DIAGNOSIS — G4710 Hypersomnia, unspecified: Secondary | ICD-10-CM | POA: Diagnosis not present

## 2018-02-18 NOTE — Progress Notes (Signed)
SLEEP MEDICINE CLINIC   Provider:  Larey Seat, M D  Primary Care Physician:   Referring Provider: Antony Contras, MD   Chief Complaint  Patient presents with  . New Patient (Initial Visit)    Referred by Sherald Barge, NP  . R/O Sleep Apnea    Had recent stroke, sx snoring, daily headaches. Diagnosis R breast cancer (upcoming surgery)    HPI:  Sandra Shelton is a 63 y.o. female , seen here as in a referral Dr. Leonie Man and NP [place] for a sleep study after CVA. Last seen 12-03-2027, today is 02-18-2018. Chief complaint according to patient : I have more nocturia, and right facial droop from CVA causes more snoring.   Mrs. Sandra Shelton is a 63 year old African-American right-handed female who presents today several months after a stroke.  The stroke was supposed to be a watershed infarct and a copy to the last note from the stroke clinic below.  She is currently on Norvasc, aspirin, Plavix, Lantus insulin Prinivil Glucophage and 2/min.  Review of systems was endorsed for right facial droop, blurred vision since a stroke and she let me know that she had a carotid endarterectomy in 2019.  Affecting the left side.  She also has been just diagnosed with right breast cancer bu mammogram and suffering from diabetes and hypertension. She will need to get off blood thinners to undergo lumpectomy.   Sleep habits are as follows: Dinner time 4.30 Pm and a snack before sleep. She retreats to the bedroom  at 7 PM and she reads in bed ( in a book) , but has the TV on, too. She prefers sleeping laterally, on one pillow, bed is non adjustable.  The bedroom is cool.  No clock to be seen, but she may sleep within the hour- she rarely  finishes a show longer than 60 minutes. Nocturia 2-3 times.  Sister has witnessed her to snore and she has woken herself snoring and gasping. She lives alone, sleeps alone. Wakes up estimated at 5 PM , rises at 7 Total Sleep time at night at least 8 hours .    Sleep  relevant medical history: 11-21-2018 Dr Leonie Man, MD    3 month follow up. Alone. Rm 9. No concerns at this time   HPI: Sandra Shelton is being seen today in the office for left pseudo-watershed territory embolic infarct in setting of diabetic ketoacidosis and high-grade proximal left ICA stenosis on 07/08/2017. History obtained from patient and chart review. Reviewed all radiology images and labs personally.  [name] is a 63 y.o. female with history of hypertension and obesity found down by her sister. She was admitted to the hospital for DKA (new diagnosis of DM).  CT done when RN noted right greater than left grip and showed subacute left frontal and left caudate infarcts.  MRI head reviewed and showed patchy multifocal early subacute left MCA infarcts.  MRA showed small left ICA compared to right with decreased flow in the left MCA distribution along with mild atherosclerotic changes.  Carotid Doppler was negative for significant extracranial stenosis.  2D echo showed an EF of 60 to 65%.  LDL 82 and recommended increasing Crestor from 20 to 40 mg daily.  A1c severely elevated at greater than 15.5 and upon admission for DKA glucose was greater than 800.  History of hypertension but stable during hospitalization and recommended PCP follow-up.  Patient was on aspirin 81 mg PTA and recommended DAPT with aspirin and Plavix for  3 weeks then Plavix alone.  Patient was discharged home with home health services on 07/13/2017.   Left TCAR carotid stent planned for 07/25/2017 due to left carotid stenosis.  Patient tolerated procedure well with residual 0% stenosis.  It was recommended for home health PT and patient was discharged home in stable condition.  Patient had follow-up appointment on 08/15/2017 with Dr. Oneida Alar. Carotid duplex showed widely patent left carotid stent with minimal stenosis on right side. Recommended for repeat carotid duplex in 6 months and continuation of statin, asa and plavix indefinitely.      Family sleep history: brother and sister out of 9 siblings- with DM, CVA and CAD.    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  Non smoker, non drinker, caffeine - soda 2 a week, coffee 1 a week, iced tea none.   Review of Systems: Out of a complete 14 system review, the patient complains of only the following symptoms, and all other reviewed systems are negative.  Right carotid artery thrombotic occlusion.   Snoring , right facial weakess, right arm and hand clumsiness. Weakness.   Epworth score 4/ 24  , Fatigue severity score 11/ 63 points   , depression score n/a    Social History   Socioeconomic History  . Marital status: Single    Spouse name: Not on file  . Number of children: Not on file  . Years of education: Not on file  . Highest education level: Not on file  Occupational History  . Not on file  Social Needs  . Financial resource strain: Not on file  . Food insecurity:    Worry: Not on file    Inability: Not on file  . Transportation needs:    Medical: Not on file    Non-medical: Not on file  Tobacco Use  . Smoking status: Never Smoker  . Smokeless tobacco: Never Used  Substance and Sexual Activity  . Alcohol use: No  . Drug use: No  . Sexual activity: Not on file  Lifestyle  . Physical activity:    Days per week: Not on file    Minutes per session: Not on file  . Stress: Not on file  Relationships  . Social connections:    Talks on phone: Not on file    Gets together: Not on file    Attends religious service: Not on file    Active member of club or organization: Not on file    Attends meetings of clubs or organizations: Not on file    Relationship status: Not on file  . Intimate partner violence:    Fear of current or ex partner: Not on file    Emotionally abused: Not on file    Physically abused: Not on file    Forced sexual activity: Not on file   Other Topics Concern  . Not on file  Social History Narrative   Lives home alone.  Single.  Education Some college.  No children.      Family History  Problem Relation Age of Onset  . Diabetes Mellitus II Sister   . Stroke Mother   . Diabetes Mellitus II Mother   . Cancer Father   . Diabetes Mellitus II Brother   . Breast cancer Neg Hx     Past Medical History:  Diagnosis Date  . Breast cancer ([place])    R side.  . Diabetes mellitus  without complication ([place])    type 2  . Embolic stroke involving carotid artery ([place])   . GERD (gastroesophageal reflux disease)   . Heart murmur    no problems with it  . Hepatitis    in the 80's, took meds at the time   . Hypertension   . Peripheral vascular disease ([facility])    carotid artery blockage   . Stroke ([place]) 07/12/2017  . Vision abnormalities     Past Surgical History:  Procedure Laterality Date  . COLONOSCOPY    . TRANSCAROTID ARTERY REVASCULARIZATION Left 07/24/2017   Procedure: TRANSCAROTID ARTERY REVASCULARIZATION, left;  Surgeon: Elam Dutch, MD;  Location: [facility];  Service: Vascular;  Laterality: Left;  . UTERINE FIBROID SURGERY     2005    Current Outpatient Medications  Medication Sig Dispense Refill  . amLODipine (NORVASC) 10 MG tablet Take 1 tablet (10 mg total) by mouth daily. 30 tablet 6  . aspirin 81 MG tablet Take 1 tablet (81 mg total) by mouth daily. 100 tablet 2  . blood glucose meter kit and supplies KIT Dispense based on patient and insurance preference. Use up to four times daily as directed. (FOR ICD-9 250.00, 250.01). 1 each 0  . clopidogrel (PLAVIX) 75 MG tablet Take 1 tablet (75 mg total) by mouth daily. 30 tablet 6  . INSULIN SYRINGE .5CC/29G 29G X 1/2" 0.5 ML MISC For insulin injection 100 each 0  . LANTUS SOLOSTAR 100 UNIT/ML Solostar Pen Inject 7 Units into the skin 2 (two) times daily. 15 mL 6  . lisinopril (PRINIVIL,ZESTRIL) 5 MG tablet Take 1 tablet (5 mg total) by mouth daily. 90 tablet 6   . metFORMIN (GLUCOPHAGE) 500 MG tablet Take 1 tablet (500 mg total) by mouth 2 (two) times daily with a meal. 60 tablet 6  . Syringe/Needle, Disp, (SYRINGE 3CC/27GX1-1/4") 27G X 1-1/4" 3 ML MISC For insulin injection 100 each 0  . topiramate (TOPAMAX) 25 MG tablet Take 1 tablet (25 mg total) by mouth every evening. 30 tablet 6   No current facility-administered medications for this visit.     Allergies as of 02/18/2018  . (No Known Allergies)    Vitals: BP 138/72   Pulse 83   Ht 5' 5"  (1.651 m)   Wt 178 lb (80.7 kg)   BMI 29.62 kg/m  Last Weight:  Wt Readings from Last 1 Encounters:  02/18/18 178 lb (80.7 kg)   NIO:EVOJ mass index is 29.62 kg/m.     Last Height:   Ht Readings from Last 1 Encounters:  02/18/18 5' 5"  (1.651 m)    Physical exam:  General: The patient is awake, alert and appears not in acute distress. The patient is well groomed. Head: Normocephalic, atraumatic. Neck is supple. Mallampati 4 - with deviated septum/ uvula,  neck circumference:15 " well healed scar for CEA- left  . Nasal airflow patent ,  Retrognathia is seen. No dentures.  Cardiovascular:  Regular rate and rhythm, without  murmurs or carotid bruit, and without distended neck veins. Respiratory: Lungs are clear to auscultation. Skin:  Without evidence of edema, or rash Trunk: BMI is: 29. 62 . The patient's posture is erect.  Neurologic exam : The patient is awake and alert, oriented to place and time.   Memory subjective  described as intact.   Attention span & concentration ability appears normal.  Speech is fluent,  without  dysarthria, dysphonia or aphasia.  Mood and affect are appropriate.  Cranial nerves: Pupils  are equal and briskly reactive to light. Funduscopic exam without evidence of pallor or edema.  Extraocular movements  in vertical and horizontal planes with a saccadic jump in the middle- blind spot ? and without nystagmus. Visual fields by finger perimetry are intact, she  acknowledged  Blurred vision in the periphery of either eye. Hearing to finger rub intact.  Facial sensation intact to fine touch.  Facial motor strength is symmetric and tongue and uvula move midline. Shoulder shrug was symmetrical.   Motor exam:  Right side lower  tone, but normal muscle bulk and decreased right hand grip-strength i.  Sensory:  Fine touch, pinprick and vibration were tested in all extremities. Proprioception tested in the upper extremities : right pronatordrift.    Coordination:  Finger-to-nose maneuver with right sided dysmetria not tremor.  Gait and station: Patient walks without assistive device and is able unassisted to climb up to the exam table. Strength within normal limits. She reports drifting to the right.   Stance is stable but wide based .  Assessment:  After physical and neurologic examination, review of laboratory studies,  Personal review of imaging studies, reports of other /same  Imaging studies, results of polysomnography and / or neurophysiology testing and pre-existing records as far as provided in visit., my assessment is   1) upper airway impact by CVA with right facial droop, uvula deviation , and large tongue. We ill need attended sleep study , Mrs. Locken is sleepier than before the stroke, more fatigue but she compensates with more sleep time.   We discussed some basic sleep hygiene.    The patient was advised of the nature of the diagnosed disorder , the treatment options and the  risks for general health and wellness arising from not treating the condition.   I spent more than 50 minutes of face to face time with the patient.  Greater than 50% of time was spent in counseling and coordination of care. We have discussed the diagnosis and differential and I answered the patient's questions.    Plan:  Treatment plan and additional workup :  Attended SPLIT night PSG. Read in bed, but don't watch TV I bed. Time limited naps to 30-45 minutes.      Larey Seat, MD 05/21/9036, 33:38 AM  Certified in Neurology by ABPN Certified in [place] by [facility] [address], [place], [place] [address]

## 2018-02-20 ENCOUNTER — Ambulatory Visit (HOSPITAL_COMMUNITY)
Admission: RE | Admit: 2018-02-20 | Discharge: 2018-02-20 | Disposition: A | Discharge status: Home / Self Care | Payer: Medicare Other | Source: Ambulatory Visit | Attending: Vascular Surgery | Admitting: Vascular Surgery

## 2018-02-20 ENCOUNTER — Other Ambulatory Visit: Payer: Self-pay

## 2018-02-20 ENCOUNTER — Encounter: Payer: Self-pay | Admitting: Family

## 2018-02-20 ENCOUNTER — Ambulatory Visit (INDEPENDENT_AMBULATORY_CARE_PROVIDER_SITE_OTHER): Payer: Medicare Other | Admitting: Physician Assistant

## 2018-02-20 ENCOUNTER — Telehealth: Payer: Self-pay | Admitting: *Deleted

## 2018-02-20 VITALS — BP 142/70 | HR 82 | Temp 97.4°F | Resp 16 | Ht 69.0 in | Wt 179.0 lb

## 2018-02-20 DIAGNOSIS — I6522 Occlusion and stenosis of left carotid artery: Secondary | ICD-10-CM | POA: Insufficient documentation

## 2018-02-20 DIAGNOSIS — I63132 Cerebral infarction due to embolism of left carotid artery: Secondary | ICD-10-CM

## 2018-02-20 DIAGNOSIS — I6330 Cerebral infarction due to thrombosis of unspecified cerebral artery: Secondary | ICD-10-CM | POA: Insufficient documentation

## 2018-02-20 NOTE — Telephone Encounter (Signed)
Left vm regarding [facility] from 1.29.20. Contact information provided for questions or needs.

## 2018-02-20 NOTE — Progress Notes (Signed)
Established Carotid Patient   History of Present Illness   Sandra Shelton is a 63 y.o. (April 21, 1955) female who presents to go over carotid duplex status post left TCAR by Dr. Oneida Alar 08/2017.  During hospitalization for DKA she was found to have right leg and right arm numbness and weakness.  Neurology was consulted and work-up included a CT of her head which demonstrated a frontal lobe left hemispheric stroke.  She was brought back as an outpatient for T CAR by Dr. Oneida Alar.  Patient states she still has some very mild weakness of her right leg which affects the way she walks however feels that her right arm is back to baseline.  She denies any further strokelike symptoms including slurring speech, changes in vision, or one-sided weakness.  She continues to take her aspirin and Plavix daily.  She states she went to her primary care doctor recently who said that she would be able to stop her statin.  She denies tobacco use.   Current Outpatient Medications  Medication Sig Dispense Refill  . amLODipine (NORVASC) 10 MG tablet Take 1 tablet (10 mg total) by mouth daily. 30 tablet 6  . aspirin 81 MG tablet Take 1 tablet (81 mg total) by mouth daily. 100 tablet 2  . blood glucose meter kit and supplies KIT Dispense based on patient and insurance preference. Use up to four times daily as directed. (FOR ICD-9 250.00, 250.01). 1 each 0  . clopidogrel (PLAVIX) 75 MG tablet Take 1 tablet (75 mg total) by mouth daily. 30 tablet 6  . INSULIN SYRINGE .5CC/29G 29G X 1/2" 0.5 ML MISC For insulin injection 100 each 0  . LANTUS SOLOSTAR 100 UNIT/ML Solostar Pen Inject 7 Units into the skin 2 (two) times daily. 15 mL 6  . lisinopril (PRINIVIL,ZESTRIL) 5 MG tablet Take 1 tablet (5 mg total) by mouth daily. 90 tablet 6  . metFORMIN (GLUCOPHAGE) 500 MG tablet Take 1 tablet (500 mg total) by mouth 2 (two) times daily with a meal. 60 tablet 6  . Syringe/Needle, Disp, (SYRINGE 3CC/27GX1-1/4") 27G X 1-1/4" 3 ML MISC For  insulin injection 100 each 0  . topiramate (TOPAMAX) 25 MG tablet Take 1 tablet (25 mg total) by mouth every evening. 30 tablet 6   No current facility-administered medications for this visit.     On ROS today: 10 system ROS is negative unless otherwise noted in HPI   Physical Examination   Vitals:   02/20/18 1046 02/20/18 1049  BP: (!) 162/87 (!) 142/70  Pulse: 86 82  Resp: 16   Temp: (!) 97.4 F (36.3 C)   TempSrc: Oral   Weight: 179 lb (81.2 kg)   Height: 5' 9"  (1.753 m)    Body mass index is 26.43 kg/m.  General Alert, O x 3, WD, NAD  Neck Supple, mid-line trachea, left neck incision well-healed    Pulmonary Sym exp, good B air movt, CTA B  Cardiac RRR, Nl S1, S2  Vascular Vessel Right Left  Radial Palpable Palpable  Carotid Palpable, No Bruit Palpable, No Bruit  Aorta Not palpable N/A    Gastro- intestinal soft, non-distended  Musculo- skeletal M/S 5/5 throughout  , Extremities without ischemic changes    Neurologic Cranial nerves 2-12 intact    Non-Invasive Vascular Imaging   B Carotid Duplex (02/20/18):   R ICA stenosis:  1-39%  R VA:  patent and antegrade  L ICA stenosis:  Patent stent without hemodynamically significant in-stent stenosis; remainder of  ICA 1 to 39% stenosis  L VA:  patent and antegrade   Medical Decision Making   Sandra Shelton is a 63 y.o. female who presents for 74-monthpostop carotid duplex after TCAR; TCAR performed due to left hemispheric CVA   Left neck incision well-healed  Carotid duplex demonstrates a patent left carotid stent without hemodynamically significant in-stent stenosis  Right ICA 1 to 39% stenosis by duplex  Patient will need to continue aspirin, Plavix, and statin therapy indefinitely  Recheck carotid duplex in 6 months per protocol; if stable at that time she can be followed annually  MDagoberto LigasPA-C [facility] GSkeneOffice: [phone] Clinic MD: Dr. FOneida Alar

## 2018-02-24 ENCOUNTER — Other Ambulatory Visit: Payer: Self-pay

## 2018-02-24 ENCOUNTER — Ambulatory Visit: Payer: Medicare Other | Attending: Internal Medicine | Admitting: Internal Medicine

## 2018-02-24 ENCOUNTER — Encounter: Payer: Self-pay | Admitting: Internal Medicine

## 2018-02-24 VITALS — BP 137/79 | HR 76 | Temp 98.4°F | Resp 16 | Wt 181.0 lb

## 2018-02-24 DIAGNOSIS — C50111 Malignant neoplasm of central portion of right female breast: Secondary | ICD-10-CM

## 2018-02-24 DIAGNOSIS — E1159 Type 2 diabetes mellitus with other circulatory complications: Secondary | ICD-10-CM

## 2018-02-24 DIAGNOSIS — Z79899 Other long term (current) drug therapy: Secondary | ICD-10-CM | POA: Diagnosis not present

## 2018-02-24 DIAGNOSIS — Z823 Family history of stroke: Secondary | ICD-10-CM | POA: Diagnosis not present

## 2018-02-24 DIAGNOSIS — I739 Peripheral vascular disease, unspecified: Secondary | ICD-10-CM | POA: Diagnosis not present

## 2018-02-24 DIAGNOSIS — Z809 Family history of malignant neoplasm, unspecified: Secondary | ICD-10-CM | POA: Diagnosis not present

## 2018-02-24 DIAGNOSIS — Z8673 Personal history of transient ischemic attack (TIA), and cerebral infarction without residual deficits: Secondary | ICD-10-CM | POA: Diagnosis not present

## 2018-02-24 DIAGNOSIS — Z833 Family history of diabetes mellitus: Secondary | ICD-10-CM | POA: Insufficient documentation

## 2018-02-24 DIAGNOSIS — Z7982 Long term (current) use of aspirin: Secondary | ICD-10-CM | POA: Insufficient documentation

## 2018-02-24 DIAGNOSIS — I10 Essential (primary) hypertension: Secondary | ICD-10-CM

## 2018-02-24 DIAGNOSIS — C50411 Malignant neoplasm of upper-outer quadrant of right female breast: Secondary | ICD-10-CM | POA: Insufficient documentation

## 2018-02-24 DIAGNOSIS — Z170 Estrogen receptor positive status [ER+]: Secondary | ICD-10-CM | POA: Diagnosis not present

## 2018-02-24 DIAGNOSIS — Z7902 Long term (current) use of antithrombotics/antiplatelets: Secondary | ICD-10-CM | POA: Diagnosis not present

## 2018-02-24 DIAGNOSIS — Z794 Long term (current) use of insulin: Secondary | ICD-10-CM

## 2018-02-24 DIAGNOSIS — Z01818 Encounter for other preprocedural examination: Secondary | ICD-10-CM | POA: Diagnosis not present

## 2018-02-24 DIAGNOSIS — I6522 Occlusion and stenosis of left carotid artery: Secondary | ICD-10-CM | POA: Diagnosis not present

## 2018-02-24 DIAGNOSIS — I779 Disorder of arteries and arterioles, unspecified: Secondary | ICD-10-CM

## 2018-02-24 MED FILL — ?METFORMIN HCL 500MG TABL: 500 | 30 days supply | Qty: 60 | Fill #4

## 2018-02-24 MED FILL — ROSUVASTATIN CALCIUM 40 MG: 40 | 30 days supply | Qty: 30 | Fill #2

## 2018-02-24 MED FILL — AMLODIPINE BESYLATE 10 MG T: 10 | 30 days supply | Qty: 30 | Fill #4

## 2018-02-24 MED FILL — TOPIRAMATE 25 MG TABS: 25 | 30 days supply | Qty: 30 | Fill #2

## 2018-02-24 MED FILL — LISINOPRIL 5 MG TAB: 5 | 30 days supply | Qty: 30 | Fill #2

## 2018-02-24 MED FILL — CLOPIDOGREL 75 MG TABLET: 75 | 30 days supply | Qty: 30 | Fill #4

## 2018-02-24 NOTE — Progress Notes (Addendum)
Patient ID: Sandra Shelton, female    DOB: August 27, 1955  MRN: [id number]  CC: Medical Clearance   Subjective: Sandra Shelton is a 63 y.o. female who presents for preoperative eval. patient will be having a lumpectomy on the right breast under general anesthesia for diagnosis of invasive ductal carcinoma of the breast 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),  LT CAS s/p TCAR (trans-carotid artery revascularization) with stent.   Only other surgery patient has had in the past was the trans-carotid artery revascularization done on the left carotid artery in August of last year. No CP/SOB/LE edema/dizziness/HA Walks about 4 blocks total QOD at a moderate pace Compliant with meds Checks BP occasionally.  Reports SBP usually less than 140.  DM:  Checks BS tid with meals.  Does not have log with her.  Gives range 90-114 No hypoglycemia Eating 3 meals a day.  She limits sugar drinks She is compliant with Metformin and Lantus.  Patient Active Problem List   Diagnosis Date Noted  . Malignant neoplasm of upper-outer quadrant of right breast in female, estrogen receptor positive ([place]) 02/10/2018  . Malignant neoplasm of central portion of right female breast ([place]) 02/06/2018  . Hypercalcemia 12/09/2017  . Diabetes mellitus type II, controlled ([place]) 09/02/2017  . Essential hypertension 09/02/2017  . Left-sided carotid artery disease ([place]) 09/02/2017  . Pre-ulcerative corn or callous 09/02/2017  . Embolic stroke involving left carotid artery ([place]) 07/24/2017  . Cerebral thrombosis with cerebral infarction 07/08/2017     Current Outpatient Medications on File Prior to Visit  Medication Sig Dispense Refill  . amLODipine (NORVASC) 10 MG tablet Take 1 tablet (10 mg total) by mouth daily. 30 tablet 6  . aspirin 81 MG tablet Take 1 tablet (81 mg total) by mouth daily. 100 tablet 2  . blood glucose meter kit and supplies  KIT Dispense based on patient and insurance preference. Use up to four times daily as directed. (FOR ICD-9 250.00, 250.01). 1 each 0  . clopidogrel (PLAVIX) 75 MG tablet Take 1 tablet (75 mg total) by mouth daily. 30 tablet 6  . INSULIN SYRINGE .5CC/29G 29G X 1/2" 0.5 ML MISC For insulin injection 100 each 0  . LANTUS SOLOSTAR 100 UNIT/ML Solostar Pen Inject 7 Units into the skin 2 (two) times daily. 15 mL 6  . lisinopril (PRINIVIL,ZESTRIL) 5 MG tablet Take 1 tablet (5 mg total) by mouth daily. 90 tablet 6  . metFORMIN (GLUCOPHAGE) 500 MG tablet Take 1 tablet (500 mg total) by mouth 2 (two) times daily with a meal. 60 tablet 6  . Syringe/Needle, Disp, (SYRINGE 3CC/27GX1-1/4") 27G X 1-1/4" 3 ML MISC For insulin injection 100 each 0  . topiramate (TOPAMAX) 25 MG tablet Take 1 tablet (25 mg total) by mouth every evening. 30 tablet 6   No current facility-administered medications on file prior to visit.     No Known Allergies  Social History   Socioeconomic History  . Marital status: Single    Spouse name: Not on file  . Number of children: Not on file  . Years of education: Not on file  . Highest education level: Not on file  Occupational History  . Not on file  Social Needs  . Financial resource strain: Not on file  . Food insecurity:    Worry: Not on file    Inability: Not on file  . Transportation needs:    Medical:  Not on file    Non-medical: Not on file  Tobacco Use  . Smoking status: Never Smoker  . Smokeless tobacco: Never Used  Substance and Sexual Activity  . Alcohol use: No  . Drug use: No  . Sexual activity: Not on file  Lifestyle  . Physical activity:    Days per week: Not on file    Minutes per session: Not on file  . Stress: Not on file  Relationships  . Social connections:    Talks on phone: Not on file    Gets together: Not on file    Attends religious service: Not on file    Active member of club or organization: Not on file    Attends meetings of clubs  or organizations: Not on file    Relationship status: Not on file  . Intimate partner violence:    Fear of current or ex partner: Not on file    Emotionally abused: Not on file    Physically abused: Not on file    Forced sexual activity: Not on file  Other Topics Concern  . Not on file  Social History Narrative   Lives home alone.  Single.  Education Some college.  No children.      Family History  Problem Relation Age of Onset  . Diabetes Mellitus II Sister   . Stroke Mother   . Diabetes Mellitus II Mother   . Cancer Father   . Diabetes Mellitus II Brother   . Breast cancer Neg Hx     Past Surgical History:  Procedure Laterality Date  . COLONOSCOPY    . TRANSCAROTID ARTERY REVASCULARIZATION Left 07/24/2017   Procedure: TRANSCAROTID ARTERY REVASCULARIZATION, left;  Surgeon: Elam Dutch, MD;  [facility];  Service: Vascular;  Laterality: Left;  . UTERINE FIBROID SURGERY     2005    ROS: Review of Systems Negative except as stated above  PHYSICAL EXAM: BP 137/79   Pulse 76   Temp 98.4 F (36.9 C) (Oral)   Resp 16   Wt 181 lb (82.1 kg)   SpO2 96%   BMI 26.73 kg/m   Physical Exam  General appearance - alert, well appearing, and in no distress Mental status - normal mood, behavior, speech, dress, motor activity, and thought processes Eyes - pupils equal and reactive, extraocular eye movements intact Nose - normal and patent, no erythema, discharge or polyps Mouth - mucous membranes moist, pharynx normal without lesions Neck - supple, no significant adenopathy Lymphatics -no cervical or axillary lymphadenopathy Chest - clear to auscultation, no wheezes, rales or rhonchi, symmetric air entry Heart - normal rate, regular rhythm, normal S1, S2, no murmurs, rubs, clicks or gallops Neurological -mild left facial droop Extremities - peripheral pulses normal, no pedal edema, no clubbing or cyanosis Diabetic Foot Exam - Simple   Simple Foot Form Visual  Inspection No deformities, no ulcerations, no other skin breakdown bilaterally:  Yes Sensation Testing Intact to touch and monofilament testing bilaterally:  Yes Pulse Check Posterior Tibialis and Dorsalis pulse intact bilaterally:  Yes Comments     CMP Latest Ref Rng & Units 02/12/2018 09/09/2017 09/02/2017  Glucose 70 - 99 mg/dL 85 - 90  BUN 8 - 23 mg/dL 17 - 12  Creatinine 0.44 - 1.00 mg/dL 0.96 - 0.78  Sodium 135 - 145 mmol/L 142 - 140  Potassium 3.5 - 5.1 mmol/L 4.2 - 3.5  Chloride 98 - 111 mmol/L 110 - 105  CO2 22 - 32 mmol/L  23 - 19(L)  Calcium 8.9 - 10.3 mg/dL 9.8 10.4(H) 10.7(H)  Total Protein 6.5 - 8.1 g/dL 7.7 - 7.9  Total Bilirubin 0.3 - 1.2 mg/dL 0.3 - <0.2  Alkaline Phos 38 - 126 U/L 60 - 72  AST 15 - 41 U/L 17 - 32  ALT 0 - 44 U/L 22 - 36(H)   Lipid Panel     Component Value Date/Time   CHOL 147 09/02/2017 1043   TRIG 105 09/02/2017 1043   HDL 48 09/02/2017 1043   CHOLHDL 3.1 09/02/2017 1043   CHOLHDL 8.2 07/09/2017 0646   VLDL 76 (H) 07/09/2017 0646   LDLCALC 78 09/02/2017 1043   Lab Results  Component Value Date   HGBA1C 5.6 12/09/2017    CBC    Component Value Date/Time   WBC 7.2 02/12/2018 1200   WBC 6.6 07/25/2017 0409   RBC 4.64 02/12/2018 1200   HGB 13.2 02/12/2018 1200   HGB 13.0 09/02/2017 1043   HCT 39.7 02/12/2018 1200   HCT 39.2 09/02/2017 1043   PLT 341 02/12/2018 1200   PLT 394 09/02/2017 1043   MCV 85.6 02/12/2018 1200   MCV 84 09/02/2017 1043   MCH 28.4 02/12/2018 1200   MCHC 33.2 02/12/2018 1200   RDW 13.5 02/12/2018 1200   RDW 13.4 09/02/2017 1043   LYMPHSABS 2.7 02/12/2018 1200   MONOABS 0.6 02/12/2018 1200   EOSABS 0.1 02/12/2018 1200   BASOSABS 0.0 02/12/2018 1200   EKG: Normal sinus rhythm, left atrial enlargement, T wave abnormalities in the lateral leads unchanged when compared to EKG done 06/2017  ASSESSMENT AND PLAN: 1. Preoperative evaluation to rule out surgical contraindication Patient scheduled for lumpectomy  of the right breast under general anesthesia.  EKG is abnormal but unchanged from June 2019, she is asymptomatic and the procedure that she is about to have is low risk.  No need for additional cardiovascular testing at this time. -I will send a message to cardiovascular surgeon Dr. Oneida Alar to make sure that it is okay for her to be off Plavix and aspirin for 7 days prior to her surgery.  She is now 7 months out from her TCAR of the left carotid -Patient advised to take her evening dose of Lantus but hold her morning dose on the morning of her procedure -Okay to proceed with planned procedure  2. Malignant neoplasm of central portion of right female breast, unspecified estrogen receptor status ([place]) Plan for lumpectomy  3. Controlled type 2 diabetes mellitus with other circulatory complication, with long-term current use of insulin ([facility]) Controlled on current medications  4. Essential hypertension Well-controlled.  5. Left-sided carotid artery disease, unspecified type ([place]) Status post TCAR LT carotid.  Plan for lifelong aspirin and Plavix per vascular surgeon note.   Patient was given the opportunity to ask questions.  Patient verbalized understanding of the plan and was able to repeat key elements of the plan.   ADDENDUM:  I heard back from Dr. Oneida Alar via in box message.  He said the Plavix should be help and restarted after surgery. He recommended that the ASA be continued if possible.  I will pass this information on to the patient and the surgeon.   Requested Prescriptions    No prescriptions requested or ordered in this encounter    Return in about 3 months (around 05/25/2018).  [name], MD, FACP

## 2018-02-24 NOTE — Patient Instructions (Signed)
You will need to hold Plavix 7 days prior to your procedure and aspirin 5 days prior to your procedure.  I will send a message to Dr. Oneida Alar to make sure that this is okay.  In regard to the Lantus insulin I would take your evening dose of 7 units the night before but hold off on taking your morning Lantus the morning of the procedure.

## 2018-02-25 ENCOUNTER — Other Ambulatory Visit: Payer: Self-pay | Admitting: General Surgery

## 2018-02-25 DIAGNOSIS — Z170 Estrogen receptor positive status [ER+]: Principal | ICD-10-CM

## 2018-02-25 DIAGNOSIS — C50411 Malignant neoplasm of upper-outer quadrant of right female breast: Secondary | ICD-10-CM

## 2018-02-26 ENCOUNTER — Telehealth: Payer: Self-pay | Admitting: Internal Medicine

## 2018-02-26 NOTE — Telephone Encounter (Signed)
Phone call placed to Dr. Darrel Hoover office at [facility] surgery.  I spoke with the triage nurse Sunday Spillers to let her know that the vascular surgeon said that the patient can hold Plavix 7 days prior to the procedure but the aspirin should be continued if possible.  Sunday Spillers states that Dr. Darrel Hoover policy is that aspirin 81 mg can be continued if the patient is on it for vascular reason which this patient is.  I told her that I will fax my preoperative evaluation note to them.  She gave the fax number of [phone] attention Kennyth Lose. I placed a phone call to the patient today and left a message on her voicemail letting her know that the Plavix need to be held 7 days prior to her procedure but the baby aspirin should be continued.

## 2018-02-27 ENCOUNTER — Telehealth: Payer: Self-pay | Admitting: Hematology and Oncology

## 2018-02-27 NOTE — Telephone Encounter (Signed)
Scheduled appt per 2/13 sch message - left message and sent reminder letter in the mail .

## 2018-02-27 NOTE — Telephone Encounter (Signed)
Faxed office note to [place] [facility]

## 2018-03-10 MED FILL — $LANTUS SOLOSTAR 100 UNITS/: 100 | 75 days supply | Qty: 15 | Fill #4

## 2018-03-16 HISTORY — PX: BREAST LUMPECTOMY: SHX2

## 2018-03-17 ENCOUNTER — Ambulatory Visit (INDEPENDENT_AMBULATORY_CARE_PROVIDER_SITE_OTHER): Payer: Medicare Other | Admitting: Neurology

## 2018-03-17 DIAGNOSIS — G4730 Sleep apnea, unspecified: Secondary | ICD-10-CM

## 2018-03-17 DIAGNOSIS — E663 Overweight: Secondary | ICD-10-CM

## 2018-03-17 DIAGNOSIS — E113299 Type 2 diabetes mellitus with mild nonproliferative diabetic retinopathy without macular edema, unspecified eye: Secondary | ICD-10-CM

## 2018-03-17 DIAGNOSIS — G4710 Hypersomnia, unspecified: Secondary | ICD-10-CM

## 2018-03-17 DIAGNOSIS — R0683 Snoring: Secondary | ICD-10-CM

## 2018-03-17 DIAGNOSIS — G4733 Obstructive sleep apnea (adult) (pediatric): Secondary | ICD-10-CM | POA: Diagnosis not present

## 2018-03-17 DIAGNOSIS — I63132 Cerebral infarction due to embolism of left carotid artery: Secondary | ICD-10-CM

## 2018-03-17 DIAGNOSIS — I6330 Cerebral infarction due to thrombosis of unspecified cerebral artery: Secondary | ICD-10-CM

## 2018-03-18 ENCOUNTER — Encounter (HOSPITAL_BASED_OUTPATIENT_CLINIC_OR_DEPARTMENT_OTHER): Payer: Self-pay | Admitting: *Deleted

## 2018-03-18 ENCOUNTER — Other Ambulatory Visit: Payer: Self-pay

## 2018-03-18 ENCOUNTER — Telehealth: Payer: Self-pay | Admitting: *Deleted

## 2018-03-18 NOTE — Telephone Encounter (Signed)
Received notification that pre-op has been attempting to reach pt to schedule pre-op for sx without success.  I called pt and was unable to leave vm. Sent verified email requesting pt to call Janett Billow with [facility] to discuss pre-op appt.

## 2018-03-20 ENCOUNTER — Encounter (HOSPITAL_BASED_OUTPATIENT_CLINIC_OR_DEPARTMENT_OTHER)
Admission: RE | Admit: 2018-03-20 | Discharge: 2018-03-20 | Disposition: A | Discharge status: Home / Self Care | Payer: Medicare Other | Source: Ambulatory Visit | Attending: General Surgery | Admitting: General Surgery

## 2018-03-20 LAB — CBC WITH DIFFERENTIAL/PLATELET
Abs Immature Granulocytes: 0.01 10*3/uL (ref 0.00–0.07)
Basophils Absolute: 0 10*3/uL (ref 0.0–0.1)
Basophils Relative: 0 %
Eosinophils Absolute: 0.2 10*3/uL (ref 0.0–0.5)
Eosinophils Relative: 2 %
HCT: 42.1 % (ref 36.0–46.0)
Hemoglobin: 13.8 g/dL (ref 12.0–15.0)
Immature Granulocytes: 0 %
LYMPHS ABS: 2.6 10*3/uL (ref 0.7–4.0)
Lymphocytes Relative: 36 %
MCH: 28.3 pg (ref 26.0–34.0)
MCHC: 32.8 g/dL (ref 30.0–36.0)
MCV: 86.3 fL (ref 80.0–100.0)
Monocytes Absolute: 0.7 10*3/uL (ref 0.1–1.0)
Monocytes Relative: 10 %
Neutro Abs: 3.7 10*3/uL (ref 1.7–7.7)
Neutrophils Relative %: 52 %
Platelets: 289 10*3/uL (ref 150–400)
RBC: 4.88 MIL/uL (ref 3.87–5.11)
RDW: 12.8 % (ref 11.5–15.5)
WBC: 7.3 10*3/uL (ref 4.0–10.5)
nRBC: 0 % (ref 0.0–0.2)

## 2018-03-20 LAB — COMPREHENSIVE METABOLIC PANEL
ALBUMIN: 4.2 g/dL (ref 3.5–5.0)
ALT: 23 U/L (ref 0–44)
AST: 25 U/L (ref 15–41)
Alkaline Phosphatase: 57 U/L (ref 38–126)
Anion gap: 10 (ref 5–15)
BILIRUBIN TOTAL: 0.4 mg/dL (ref 0.3–1.2)
BUN: 10 mg/dL (ref 8–23)
CO2: 23 mmol/L (ref 22–32)
Calcium: 9.6 mg/dL (ref 8.9–10.3)
Chloride: 106 mmol/L (ref 98–111)
Creatinine, Ser: 0.69 mg/dL (ref 0.44–1.00)
GFR calc Af Amer: >60 mL/min (ref >60)
GFR calc non Af Amer: >60 mL/min (ref >60)
Glucose, Bld: 79 mg/dL (ref 70–99)
POTASSIUM: 4.2 mmol/L (ref 3.5–5.1)
Sodium: 139 mmol/L (ref 135–145)
Total Protein: 7.8 g/dL (ref 6.5–8.1)

## 2018-03-20 NOTE — Procedures (Signed)
PATIENT'S NAME:  Sandra Shelton, Sandra Shelton DOB:      1955-08-16      MR#:    [id number]     DATE OF RECORDING: 03/17/2018 REFERRING M.D.:  Antony Contras, MD Study Performed:   Baseline Polysomnogram HISTORY:  Sandra Shelton is a 63 y.o. female stroke service patient seen in a referral Dr. Leonie Man and NP [place] for a sleep study after CVA- today is 02-18-2018. Chief complaint according to patient :" I have more nocturia, and right facial droop from CVA causes more snoring".    Sandra Shelton is a 63 year old African-American right-handed female who presents today several months after her stroke.  The stroke was a watershed infarct and a copy to the last note from the stroke clinic below.  She is currently on Norvasc, aspirin, Plavix, Lantus insulin Prinivil Glucophage and 2/min.  Review of systems was endorsed for right facial droop, blurred vision since a stroke and she let me know that she had a carotid endarterectomy in 2019.  Affecting the left side.  She also has been just diagnosed with right breast cancer by mammogram and suffering from diabetes and hypertension. She will need to get off blood thinners to undergo lumpectomy. Nocturia 2-3 times.  Sister has witnessed her to snore and she has woken herself snoring and gasping. She lives alone, sleeps alone. Wakes up estimated at 5 AM, rises at 7-Total Sleep time at night at least 8 hours.    The patient endorsed the Epworth Sleepiness Scale at 4 points.   The patient's weight 179 pounds with a height of 65 (inches), resulting in a BMI of 29.8 kg/m2. The patient's neck circumference measured 15 inches.  CURRENT MEDICATIONS: Norvasc, Aspirin, Plavix, Prinivil, Metformin, Topamax   PROCEDURE:  This is a multichannel digital polysomnogram utilizing the Somnostar 11.2 system.  Electrodes and sensors were applied and monitored per AASM Specifications.   EEG, EOG, Chin and Limb EMG, were sampled at 200 Hz.  ECG, Snore and Nasal Pressure, Thermal Airflow,  Respiratory Effort, CPAP Flow and Pressure, Oximetry was sampled at 50 Hz. Digital video and audio were recorded.      BASELINE STUDY: Lights Out was at 22:32 and Lights On at 05:08.  Total recording time (TRT) was 397 minutes, with a total sleep time (TST) of 352.5 minutes.   The patient's sleep latency was 16.5 minutes.  REM latency was 61 minutes.  The sleep efficiency was 88.8 %.     SLEEP ARCHITECTURE: WASO (Wake after sleep onset) was 28 minutes.  There were 18 minutes in Stage N1, 72 minutes Stage N2, 149 minutes Stage N3 and 113.5 minutes in Stage REM.  The percentage of Stage N1 was 5.1%, Stage N2 was 20.4%, Stage N3 was 42.3% and Stage R (REM sleep) was 32.2%.    RESPIRATORY ANALYSIS:  There were a total of 83 respiratory events:  33 obstructive apneas, 3 central apneas and 12 mixed apneas with 35 hypopneas with a hypopnea index of 6.0 /hour. The patient also had many respiratory event related arousals (RERAs).      The total APNEA/HYPOPNEA INDEX (AHI) was 14.1 /hour.   70 events occurred in REM sleep and 10 events in NREM. The REM AHI was 37.0 /hour, versus a non-REM AHI of 3.3/h. The patient spent 88.5 minutes of total sleep time in the supine position and 264 minutes in non-supine. The supine AHI was 4.7 versus a non-supine AHI of 17.3.  OXYGEN SATURATION & C02:  The [facility] baseline 02  saturation was 98%, with the lowest being 82%. Time spent below 89% saturation equaled 5 minutes.   PERIODIC LIMB MOVEMENTS: The arousals were noted as: 29 were spontaneous, 0 were associated with PLMs, and 12 were associated with respiratory events. The patient had a total of 0 Periodic Limb Movements.   Audio and video analysis did not show any abnormal or unusual movements, behaviors, phonations or vocalizations.  The patient had no bathroom breaks. Loud to very loud Snoring was noted, contributing to arousals. EKG was in keeping with normal sinus rhythm (NSR).  IMPRESSION:  1. Complex, mostly  Obstructive Sleep Apnea (OSA)at a moderate degree. AHI was 14.1/h,not supine dependent and strongly REM sleep dependent. AHI in REM was 37/h. 2. Thunderous snoring - Snoring was recorded before the patient fell asleep, in drowsiness.  3. No sleep hypoxemia.  4. normal EKG   RECOMMENDATIONS:  Advise full-night, attended, CPAP titration study to optimize therapy. REM dependent sleep apnea is less likely responsive to dental devices.     I certify that I have reviewed the entire raw data recording prior to the issuance of this report in accordance with the Standards of Accreditation of the American Academy of Sleep Medicine (AASM)   Larey Seat, MD   03-20-2018 Diplomat, American Board of Psychiatry and Neurology  Diplomat, American Board of [place] Director, [employer] [facility] [employer]

## 2018-03-20 NOTE — Addendum Note (Signed)
Addended by: Larey Seat on: 03/20/2018 05:35 PM   Modules accepted: Orders

## 2018-03-20 NOTE — Progress Notes (Signed)
Ensure pre surgery drink given with instructions to complete by 0415 dos, surgical soap given with instructions, pt verbalized understanding. 

## 2018-03-21 ENCOUNTER — Ambulatory Visit
Admission: RE | Admit: 2018-03-21 | Discharge: 2018-03-21 | Disposition: A | Discharge status: Home / Self Care | Payer: Medicare Other | Source: Ambulatory Visit | Attending: General Surgery | Admitting: General Surgery

## 2018-03-21 ENCOUNTER — Telehealth: Payer: Self-pay | Admitting: Neurology

## 2018-03-21 DIAGNOSIS — D0511 Intraductal carcinoma in situ of right breast: Secondary | ICD-10-CM | POA: Diagnosis not present

## 2018-03-21 DIAGNOSIS — C50411 Malignant neoplasm of upper-outer quadrant of right female breast: Secondary | ICD-10-CM

## 2018-03-21 DIAGNOSIS — Z170 Estrogen receptor positive status [ER+]: Principal | ICD-10-CM

## 2018-03-21 NOTE — Telephone Encounter (Signed)
Called patient to discuss sleep study results. No answer at this time. LVM for the patient to call back.   

## 2018-03-21 NOTE — Telephone Encounter (Signed)
-----   Message from Larey Seat, MD sent at 03/20/2018  5:32 PM EST ----- 1. Complex, mostly Obstructive Sleep Apnea (OSA) at a moderate degree. AHI was 14.1/h,not supine dependent and strongly REM sleep dependent. AHI in REM was 37/h. 2. Thunderous snoring - Snoring was recorded before the patient fell asleep, in drowsiness.  3. No sleep hypoxemia.  4. normal EKG Advise full-night, attended, CPAP titration study to optimize therapy. REM dependent sleep apnea is less likely responsive to dental devices.

## 2018-03-22 NOTE — H&P (Signed)
Sandra Shelton Location: [facility] Patient #: [id number] DOB: May 06, 1955 Undefined / Language: Sandra Shelton / Race: Black or African American Female        History of Present Illness      . This is a 63 year old female, referred by Sandra Shelton at the [facility] for evaluation and management of newly diagnosed invasive cancer right breast, upper outer quadrant. Sandra Shelton is her PCP. Sandra Shelton performed her carotid surgery and has placed her on Plavix. She is seen in the multidisciplinary breast clinic today by Sandra Shelton, Sandra Shelton, and me. The entire encounter was chaperoned by the Sandra Shelton today     Last mammogram 5 years ago. Recent screening mammogram show an 8 mm mass in the right breast laterally. The left breast looks fine. The right axilla looks fine. 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 ER 100%. PR 70%. Ki-67 20%. HER-2 positive. She developed a significant hematoma     Past history significant for cerebrovascular accident 9675 felt to be embolic. She underwent trans-carotid graft on the left side by Sandra Shelton. She takes Plavix and aspirin. Insulin-dependent diabetes. GERD. Hypertension. Surgery for uterine fibroids. Family history reveals a paternal aunt with breast cancer. Otherwise no cancer syndromes. Father may have had prostate cancer but is in his 7s is still living. Mother died of a stroke and diabetes. Social history she is single. No children. Lives in [place]. Lives alone. Denies tobacco. Unemployed. She has a sister nearby that can help her.     We had a very long discussion. She seems to understand her diagnosis and the need for surgical intervention and multidisciplinary care. We talked about lumpectomy, sentinel node biopsy and compared that the mastectomy with or without reconstruction. She is interested in breast conservation if it is equivalent in long-term survival. I  think she is an excellent candidate for that.     She'll be scheduled for right breast lumpectomy with radioactive seed localization and right axillary sentinel node biopsy. I discussed the indications, details, techniques, and numerous risk of the surgery with her. She is aware of the risk of bleeding, infection, reoperation for positive margins or positive nodes, cosmetic deformity, nerve damage with chronic pain, cardiac pulmonary and thromboembolic problems. She understands all these issues. All of her questions are answered. She agrees with this plan.     She will need to stop her Plavix a full 5 days preop and we will request permission for that from Sandra Shelton and from her PCP if everything goes well this will be outpatient surgery She knows she will need whole breast radiation therapy and anti-estrogens. Decisions about Herceptin-based chemotherapy will be made after surgery was to see what the final pathology report shows      Past Surgical History  Breast Biopsy  Right.  Diagnostic Studies History  Mammogram  within last year Pap Smear  1-5 years ago  Medication History  Medications Reconciled  Social History  Alcohol use  Remotely quit alcohol use. Caffeine use  Coffee. No drug use  Tobacco use  Never smoker.  Family History  Arthritis  Family Members In General, Father, Mother. Breast Cancer  Sister. Cerebrovascular Accident  Father, Mother. Colon Cancer  Father. Diabetes Mellitus  Brother, Sister.  Pregnancy / Birth History  Age at menarche  64 years. Age of menopause  88-55 Gravida  0 Irregular periods   Other Problems Cerebrovascular Accident  Diabetes Mellitus  Gastroesophageal Reflux Disease  Heart murmur  High blood pressure  Lump In Breast     Review of Systems  General Not Present- Appetite Loss, Chills, Fatigue, Fever, Night Sweats, Weight Gain and Weight Loss. Skin Not Present- Change in Wart/Mole, Dryness, Hives,  Jaundice, New Lesions, Non-Healing Wounds, Rash and Ulcer. Respiratory Present- Snoring. Not Present- Bloody sputum, Chronic Cough, Difficulty Breathing and Wheezing. Breast Not Present- Breast Mass, Breast Pain, Nipple Discharge and Skin Changes. Cardiovascular Not Present- Chest Pain, Difficulty Breathing Lying Down, Leg Cramps, Palpitations, Rapid Heart Rate, Shortness of Breath and Swelling of Extremities. Gastrointestinal Not Present- Abdominal Pain, Bloating, Bloody Stool, Change in Bowel Habits, Chronic diarrhea, Constipation, Difficulty Swallowing, Excessive gas, Gets full quickly at meals, Hemorrhoids, Indigestion, Nausea, Rectal Pain and Vomiting. Female Genitourinary Not Present- Frequency, Nocturia, Painful Urination, Pelvic Pain and Urgency.   Physical Exam  General Mental Status-Alert. General Appearance-Consistent with stated age. Hydration-Well hydrated. Voice-Normal.  Head and Neck Head-normocephalic, atraumatic with no lesions or palpable masses. Trachea-midline. Thyroid Gland Characteristics - normal size and consistency. Note: Transverse scar left supraclavicular area   Eye Eyeball - Bilateral-Extraocular movements intact. Sclera/Conjunctiva - Bilateral-No scleral icterus.  Chest and Lung Exam Chest and lung exam reveals -quiet, even and easy respiratory effort with no use of accessory muscles and on auscultation, normal breath sounds, no adventitious sounds and normal vocal resonance. Inspection Chest Wall - Normal. Back - normal.  Breast Note: 6 cm hematoma right breast upper outer quadrant with ecchymoses. No other skin change or palpable mass in either breast. No palpable axillary adenopathy.   Cardiovascular Cardiovascular examination reveals -normal heart sounds, regular rate and rhythm with no murmurs and normal pedal pulses bilaterally.  Abdomen Inspection Inspection of the abdomen reveals - No Hernias. Skin - Scar - no  surgical scars. Palpation/Percussion Palpation and Percussion of the abdomen reveal - Soft, Non Tender, No Rebound tenderness, No Rigidity (guarding) and No hepatosplenomegaly. Auscultation Auscultation of the abdomen reveals - Bowel sounds normal.  Neurologic Neurologic evaluation reveals -alert and oriented x 3 with no impairment of recent or remote memory. Mental Status-Normal.  Musculoskeletal Normal Exam - Left-Upper Extremity Strength Normal and Lower Extremity Strength Normal. Normal Exam - Right-Upper Extremity Strength Normal and Lower Extremity Strength Normal.  Lymphatic Head & Neck  General Head & Neck Lymphatics: Bilateral - Description - Normal. Axillary  General Axillary Region: Bilateral - Description - Normal. Tenderness - Non Tender. Femoral & Inguinal  Generalized Femoral & Inguinal Lymphatics: Bilateral - Description - Normal. Tenderness - Non Tender.    Assessment & Plan  PRIMARY CANCER OF UPPER OUTER QUADRANT OF RIGHT FEMALE BREAST (C50.411)   He'll restart recent imaging studies and biopsy show a small cancer in the right breast The mammograms and ultrasound show an 8 mm mass in the right breast laterally. The left breast looks normal Ultrasound of the right axilla showed no enlarged lymph nodes  Biopsy shows invasive ductal carcinoma. The tumor is sensitive to estrogen and progesterone. The tumor is also HER-2 positive. you will need to have surgery to control your cancer you also need radiation therapy You may or may not need chemotherapy. That decision will be made later  We talked about surgical options including lumpectomy, sentinel lymph node biopsy. We compared that to mastectomy with or without reconstruction. There does not appear to be increased survival advantage to mastectomy We have decided to proceed with right breast lumpectomy with radioactive seed localization and right axillary sentinel lymph node biopsy Dr. Dalbert Batman  wants to  postpone the surgery for about 3 weeks because of the hematoma in your right breast I have discussed the indications, techniques, and risk of the surgery in detail with you  You will need to stop your Plavix blood thinner a full 5 days preop We will ask your primary care physician and Sandra [facility]ton for permission to do this We can start the Plavix back 2 days postop if there are no bleeding problems  This is outpatient surgery. you stated that your sister can stay with you and help with your recovery for a couple of days and that is very good.  Dr. Darrel Hoover office will call you soon to begin the scheduling process and setting up the placement of the radioactive seed.  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 Trans-carotid grafting performed by Sandra Shelton. 2019. Takes Plavix and aspirin ANTICOAGULATED (Z79.01) Impression: Plavix and aspirin HYPERTENSION, BENIGN (I10) H/O: MYOMECTOMY (X50.722)    .hmois

## 2018-03-23 ENCOUNTER — Encounter (HOSPITAL_BASED_OUTPATIENT_CLINIC_OR_DEPARTMENT_OTHER): Payer: Self-pay | Admitting: Anesthesiology

## 2018-03-23 NOTE — Anesthesia Preprocedure Evaluation (Addendum)
Anesthesia Evaluation  Patient identified by MRN, date of birth, ID band Patient awake    Airway Mallampati: II       Dental no notable dental hx. (+) Teeth Intact   Pulmonary neg pulmonary ROS,    Pulmonary exam normal breath sounds clear to auscultation       Cardiovascular hypertension, Pt. on medications Normal cardiovascular exam Rhythm:Regular Rate:Normal     Neuro/Psych negative neurological ROS     GI/Hepatic   Endo/Other  diabetes, Type 2, Insulin Dependent, Oral Hypoglycemic Agents  Renal/GU   negative genitourinary   Musculoskeletal negative musculoskeletal ROS (+)   Abdominal Normal abdominal exam  (+)   Peds  Hematology   Anesthesia Other Findings Summary: Right Carotid: Velocities in the right ICA are consistent with a 1-39% stenosis.                Non-hemodynamically significant plaque <50% noted in the CCA. ------------------------------------------------------------------- LV EF: 75% -   80%   Left Carotid: There is no evidence of stenosis in the left ICA.               Non-hemodynamically significant plaque noted in the CCA.  Vertebrals:  Bilateral vertebral arteries demonstrate antegrade flow. Right              vertebral artery demonstrates high resistant flow. Subclavians: Normal flow hemodynamics were seen in bilateral subclavian              arteries.  *See table(s) above for measurements and observations.      Reproductive/Obstetrics                           Anesthesia Physical Anesthesia Plan  ASA: III  Anesthesia Plan: General   Post-op Pain Management:  Regional for Post-op pain   Induction: Intravenous  PONV Risk Score and Plan: 3 and Ondansetron, Midazolam and Treatment may vary due to age or medical condition  Airway Management Planned: LMA  Additional Equipment:   Intra-op Plan:   Post-operative Plan: Extubation in OR  Informed  Consent: I have reviewed the patients History and Physical, chart, labs and discussed the procedure including the risks, benefits and alternatives for the proposed anesthesia with the patient or authorized representative who has indicated his/her understanding and acceptance.     Dental advisory given  Plan Discussed with: CRNA  Anesthesia Plan Comments:        Anesthesia Quick Evaluation

## 2018-03-24 ENCOUNTER — Ambulatory Visit
Admission: RE | Admit: 2018-03-24 | Discharge: 2018-03-24 | Disposition: A | Discharge status: Home / Self Care | Payer: Medicare Other | Source: Ambulatory Visit | Attending: General Surgery | Admitting: General Surgery

## 2018-03-24 ENCOUNTER — Encounter (HOSPITAL_BASED_OUTPATIENT_CLINIC_OR_DEPARTMENT_OTHER): Payer: Self-pay | Admitting: Certified Registered"

## 2018-03-24 ENCOUNTER — Other Ambulatory Visit: Payer: Self-pay

## 2018-03-24 ENCOUNTER — Ambulatory Visit (HOSPITAL_BASED_OUTPATIENT_CLINIC_OR_DEPARTMENT_OTHER): Payer: Medicare Other | Admitting: Anesthesiology

## 2018-03-24 ENCOUNTER — Ambulatory Visit (HOSPITAL_COMMUNITY)
Admission: RE | Admit: 2018-03-24 | Discharge: 2018-03-24 | Disposition: A | Discharge status: Home / Self Care | Payer: Medicare Other | Source: Ambulatory Visit | Attending: General Surgery | Admitting: General Surgery

## 2018-03-24 ENCOUNTER — Ambulatory Visit (HOSPITAL_BASED_OUTPATIENT_CLINIC_OR_DEPARTMENT_OTHER)
Admission: RE | Admit: 2018-03-24 | Discharge: 2018-03-24 | Disposition: A | Discharge status: Home / Self Care | Payer: Medicare Other | Attending: General Surgery | Admitting: General Surgery

## 2018-03-24 ENCOUNTER — Encounter (HOSPITAL_BASED_OUTPATIENT_CLINIC_OR_DEPARTMENT_OTHER)
Admission: RE | Disposition: A | Discharge status: Home / Self Care | Payer: Self-pay | Source: Home / Self Care | Attending: General Surgery

## 2018-03-24 ENCOUNTER — Telehealth: Payer: Self-pay | Admitting: Neurology

## 2018-03-24 DIAGNOSIS — Z7982 Long term (current) use of aspirin: Secondary | ICD-10-CM | POA: Diagnosis not present

## 2018-03-24 DIAGNOSIS — Z803 Family history of malignant neoplasm of breast: Secondary | ICD-10-CM | POA: Insufficient documentation

## 2018-03-24 DIAGNOSIS — Z794 Long term (current) use of insulin: Secondary | ICD-10-CM | POA: Diagnosis not present

## 2018-03-24 DIAGNOSIS — C50411 Malignant neoplasm of upper-outer quadrant of right female breast: Secondary | ICD-10-CM

## 2018-03-24 DIAGNOSIS — E119 Type 2 diabetes mellitus without complications: Secondary | ICD-10-CM | POA: Insufficient documentation

## 2018-03-24 DIAGNOSIS — C50911 Malignant neoplasm of unspecified site of right female breast: Secondary | ICD-10-CM | POA: Diagnosis not present

## 2018-03-24 DIAGNOSIS — I10 Essential (primary) hypertension: Secondary | ICD-10-CM | POA: Diagnosis not present

## 2018-03-24 DIAGNOSIS — Z7902 Long term (current) use of antithrombotics/antiplatelets: Secondary | ICD-10-CM | POA: Insufficient documentation

## 2018-03-24 DIAGNOSIS — Z8673 Personal history of transient ischemic attack (TIA), and cerebral infarction without residual deficits: Secondary | ICD-10-CM | POA: Insufficient documentation

## 2018-03-24 DIAGNOSIS — Z170 Estrogen receptor positive status [ER+]: Secondary | ICD-10-CM | POA: Insufficient documentation

## 2018-03-24 DIAGNOSIS — G8918 Other acute postprocedural pain: Secondary | ICD-10-CM | POA: Diagnosis not present

## 2018-03-24 HISTORY — PX: BREAST LUMPECTOMY WITH RADIOACTIVE SEED AND SENTINEL LYMPH NODE BIOPSY: SHX6550

## 2018-03-24 LAB — GLUCOSE, CAPILLARY
Glucose-Capillary: 69 mg/dL — ABNORMAL LOW (ref 70–99)
Glucose-Capillary: 101 mg/dL — ABNORMAL HIGH (ref 70–99)
Glucose-Capillary: 94 mg/dL (ref 70–99)

## 2018-03-24 SURGERY — BREAST LUMPECTOMY WITH RADIOACTIVE SEED AND SENTINEL LYMPH NODE BIOPSY
Anesthesia: General | Site: Breast | Laterality: Right

## 2018-03-24 MED ORDER — CEFAZOLIN SODIUM-DEXTROSE 2-4 GM/100ML-% IV SOLN
INTRAVENOUS | Status: AC
  Filled 2018-03-24: qty 100

## 2018-03-24 MED ORDER — METHYLENE BLUE 0.5 % INJ SOLN
INTRAVENOUS | Status: AC
  Filled 2018-03-24: qty 20

## 2018-03-24 MED ORDER — FENTANYL CITRATE (PF) 100 MCG/2ML IJ SOLN
INTRAMUSCULAR | Status: AC
  Filled 2018-03-24: qty 2

## 2018-03-24 MED ORDER — ONDANSETRON HCL 4 MG/2ML IJ SOLN
INTRAMUSCULAR | Status: AC
  Filled 2018-03-24: qty 14

## 2018-03-24 MED ORDER — GABAPENTIN 300 MG PO CAPS
300.0000 mg | ORAL_CAPSULE | ORAL | Status: AC
  Administered 2018-03-24: 300 mg via ORAL

## 2018-03-24 MED ORDER — ACETAMINOPHEN 500 MG PO TABS
1000.0000 mg | ORAL_TABLET | ORAL | Status: AC
  Administered 2018-03-24: 1000 mg via ORAL

## 2018-03-24 MED ORDER — GABAPENTIN 300 MG PO CAPS
ORAL_CAPSULE | ORAL | Status: AC
  Filled 2018-03-24: qty 1

## 2018-03-24 MED ORDER — LACTATED RINGERS IV SOLN
INTRAVENOUS | Status: DC
  Administered 2018-03-24 (×2): via INTRAVENOUS

## 2018-03-24 MED ORDER — CHLORHEXIDINE GLUCONATE CLOTH 2 % EX PADS: 6.0000 | MEDICATED_PAD | Freq: Once | CUTANEOUS | Status: DC

## 2018-03-24 MED ORDER — MIDAZOLAM HCL 2 MG/2ML IJ SOLN
1.0000 mg | INTRAMUSCULAR | Status: DC | PRN
  Administered 2018-03-24 (×2): 1 mg via INTRAVENOUS

## 2018-03-24 MED ORDER — FENTANYL CITRATE (PF) 100 MCG/2ML IJ SOLN
50.0000 ug | INTRAMUSCULAR | Status: DC | PRN
  Administered 2018-03-24 (×2): 50 ug via INTRAVENOUS

## 2018-03-24 MED ORDER — SODIUM CHLORIDE (PF) 0.9 % IJ SOLN
INTRAMUSCULAR | Status: AC
  Filled 2018-03-24: qty 20

## 2018-03-24 MED ORDER — FENTANYL CITRATE (PF) 100 MCG/2ML IJ SOLN
INTRAMUSCULAR | Status: DC | PRN
  Administered 2018-03-24: 25 ug via INTRAVENOUS
  Administered 2018-03-24: 50 ug via INTRAVENOUS
  Administered 2018-03-24: 25 ug via INTRAVENOUS

## 2018-03-24 MED ORDER — ACETAMINOPHEN 500 MG PO TABS
ORAL_TABLET | ORAL | Status: AC
  Filled 2018-03-24: qty 2

## 2018-03-24 MED ORDER — CEFAZOLIN SODIUM-DEXTROSE 2-4 GM/100ML-% IV SOLN
2.0000 g | INTRAVENOUS | Status: AC
  Administered 2018-03-24: 2 g via INTRAVENOUS

## 2018-03-24 MED ORDER — PROPOFOL 500 MG/50ML IV EMUL
INTRAVENOUS | Status: DC | PRN
  Administered 2018-03-24: 25 ug/kg/min via INTRAVENOUS

## 2018-03-24 MED ORDER — PROPOFOL 10 MG/ML IV BOLUS
INTRAVENOUS | Status: AC
  Filled 2018-03-24: qty 20

## 2018-03-24 MED ORDER — TECHNETIUM TC 99M SULFUR COLLOID FILTERED
1.0000 | Freq: Once | INTRAVENOUS | Status: AC | PRN
  Administered 2018-03-24: 1 via INTRADERMAL

## 2018-03-24 MED ORDER — DEXAMETHASONE SODIUM PHOSPHATE 10 MG/ML IJ SOLN
INTRAMUSCULAR | Status: AC
  Filled 2018-03-24: qty 5

## 2018-03-24 MED ORDER — ROPIVACAINE HCL 5 MG/ML IJ SOLN
INTRAMUSCULAR | Status: DC | PRN
  Administered 2018-03-24 (×6): 5 mL via PERINEURAL

## 2018-03-24 MED ORDER — LIDOCAINE 2% (20 MG/ML) 5 ML SYRINGE
INTRAMUSCULAR | Status: AC
  Filled 2018-03-24: qty 25

## 2018-03-24 MED ORDER — SCOPOLAMINE 1 MG/3DAYS TD PT72: 1.0000 | MEDICATED_PATCH | Freq: Once | TRANSDERMAL | Status: DC | PRN

## 2018-03-24 MED ORDER — CELECOXIB 200 MG PO CAPS
ORAL_CAPSULE | ORAL | Status: AC
  Filled 2018-03-24: qty 1

## 2018-03-24 MED ORDER — LIDOCAINE HCL (CARDIAC) PF 100 MG/5ML IV SOSY
PREFILLED_SYRINGE | INTRAVENOUS | Status: DC | PRN
  Administered 2018-03-24: 30 mg via INTRAVENOUS

## 2018-03-24 MED ORDER — ONDANSETRON HCL 4 MG/2ML IJ SOLN
INTRAMUSCULAR | Status: DC | PRN
  Administered 2018-03-24: 4 mg via INTRAVENOUS

## 2018-03-24 MED ORDER — MIDAZOLAM HCL 2 MG/2ML IJ SOLN
INTRAMUSCULAR | Status: AC
  Filled 2018-03-24: qty 2

## 2018-03-24 MED ORDER — BUPIVACAINE-EPINEPHRINE (PF) 0.25% -1:200000 IJ SOLN
INTRAMUSCULAR | Status: AC
  Filled 2018-03-24: qty 120

## 2018-03-24 MED ORDER — DEXAMETHASONE SODIUM PHOSPHATE 4 MG/ML IJ SOLN
INTRAMUSCULAR | Status: DC | PRN
  Administered 2018-03-24: 4 mg via INTRAVENOUS

## 2018-03-24 MED ORDER — CELECOXIB 200 MG PO CAPS
200.0000 mg | ORAL_CAPSULE | ORAL | Status: AC
  Administered 2018-03-24: 200 mg via ORAL

## 2018-03-24 MED ORDER — BUPIVACAINE-EPINEPHRINE (PF) 0.25% -1:200000 IJ SOLN
INTRAMUSCULAR | Status: DC | PRN
  Administered 2018-03-24: 11 mL

## 2018-03-24 MED ORDER — SODIUM CHLORIDE (PF) 0.9 % IJ SOLN
INTRAVENOUS | Status: DC | PRN
  Administered 2018-03-24: 5 mL via INTRAMUSCULAR

## 2018-03-24 MED ORDER — PROPOFOL 10 MG/ML IV BOLUS
INTRAVENOUS | Status: DC | PRN
  Administered 2018-03-24: 150 mg via INTRAVENOUS

## 2018-03-24 MED ORDER — HYDROCODONE-ACETAMINOPHEN 5-325 MG PO TABS: 1.0000 | ORAL_TABLET | Freq: Four times a day (QID) | ORAL | 0 refills | Status: DC | PRN

## 2018-03-24 SURGICAL SUPPLY — 57 items
APPLIER CLIP 11 MED OPEN (CLIP) ×3
BINDER BREAST LRG (GAUZE/BANDAGES/DRESSINGS) IMPLANT
BINDER BREAST XLRG (GAUZE/BANDAGES/DRESSINGS) ×3 IMPLANT
BLADE HEX COATED 2.75 (ELECTRODE) ×3 IMPLANT
BLADE SURG 15 STRL LF DISP TIS (BLADE) ×2 IMPLANT
BLADE SURG 15 STRL SS (BLADE) ×4
CANISTER SUCT 1200ML W/VALVE (MISCELLANEOUS) ×3 IMPLANT
CHLORAPREP W/TINT 26 (MISCELLANEOUS) ×3 IMPLANT
CLIP APPLIE 11 MED OPEN (CLIP) ×1 IMPLANT
COVER BACK TABLE 60X90IN (DRAPES) ×3 IMPLANT
COVER MAYO STAND STRL (DRAPES) ×3 IMPLANT
COVER PROBE W GEL 5X96 (DRAPES) ×3 IMPLANT
DECANTER SPIKE VIAL GLASS SM (MISCELLANEOUS) IMPLANT
DERMABOND ADVANCED (GAUZE/BANDAGES/DRESSINGS) ×2
DERMABOND ADVANCED .7 DNX12 (GAUZE/BANDAGES/DRESSINGS) ×1 IMPLANT
DRAPE LAPAROSCOPIC ABDOMINAL (DRAPES) ×3 IMPLANT
DRAPE UTILITY XL STRL (DRAPES) ×3 IMPLANT
DRSG PAD ABDOMINAL 8X10 ST (GAUZE/BANDAGES/DRESSINGS) ×3 IMPLANT
ELECT REM PT RETURN 9FT ADLT (ELECTROSURGICAL) ×3
ELECTRODE REM PT RTRN 9FT ADLT (ELECTROSURGICAL) ×1 IMPLANT
GAUZE SPONGE 4X4 12PLY STRL (GAUZE/BANDAGES/DRESSINGS) ×3 IMPLANT
GAUZE SPONGE 4X4 12PLY STRL LF (GAUZE/BANDAGES/DRESSINGS) ×3 IMPLANT
GLOVE BIO SURGEON STRL SZ 6.5 (GLOVE) ×2 IMPLANT
GLOVE BIO SURGEONS STRL SZ 6.5 (GLOVE) ×1
GLOVE BIOGEL PI IND STRL 7.0 (GLOVE) ×1 IMPLANT
GLOVE BIOGEL PI INDICATOR 7.0 (GLOVE) ×2
GLOVE EUDERMIC 7 POWDERFREE (GLOVE) ×3 IMPLANT
GOWN STRL REUS W/ TWL LRG LVL3 (GOWN DISPOSABLE) ×1 IMPLANT
GOWN STRL REUS W/ TWL XL LVL3 (GOWN DISPOSABLE) ×1 IMPLANT
GOWN STRL REUS W/TWL LRG LVL3 (GOWN DISPOSABLE) ×2
GOWN STRL REUS W/TWL XL LVL3 (GOWN DISPOSABLE) ×2
HEMOSTAT SNOW SURGICEL 2X4 (HEMOSTASIS) ×3 IMPLANT
KIT MARKER MARGIN INK (KITS) ×3 IMPLANT
NDL SAFETY ECLIPSE 18X1.5 (NEEDLE) ×2 IMPLANT
NEEDLE HYPO 18GX1.5 SHARP (NEEDLE) ×4
NEEDLE HYPO 25X1 1.5 SAFETY (NEEDLE) ×6 IMPLANT
NS IRRIG 1000ML POUR BTL (IV SOLUTION) ×3 IMPLANT
PACK BASIN DAY SURGERY FS (CUSTOM PROCEDURE TRAY) ×3 IMPLANT
PAD ALCOHOL SWAB (MISCELLANEOUS) ×3 IMPLANT
PENCIL BUTTON HOLSTER BLD 10FT (ELECTRODE) ×3 IMPLANT
SHEET MEDIUM DRAPE 40X70 STRL (DRAPES) ×3 IMPLANT
SLEEVE SCD COMPRESS KNEE MED (MISCELLANEOUS) ×3 IMPLANT
SPONGE LAP 18X18 RF (DISPOSABLE) ×3 IMPLANT
SPONGE LAP 4X18 RFD (DISPOSABLE) ×3 IMPLANT
SUT MNCRL AB 4-0 PS2 18 (SUTURE) ×6 IMPLANT
SUT SILK 2 0 SH (SUTURE) ×3 IMPLANT
SUT VIC AB 2-0 CT1 27 (SUTURE)
SUT VIC AB 2-0 CT1 TAPERPNT 27 (SUTURE) IMPLANT
SUT VIC AB 3-0 SH 27 (SUTURE)
SUT VIC AB 3-0 SH 27X BRD (SUTURE) IMPLANT
SUT VICRYL 3-0 CR8 SH (SUTURE) ×3 IMPLANT
SYR 10ML LL (SYRINGE) ×6 IMPLANT
TOWEL GREEN STERILE FF (TOWEL DISPOSABLE) ×6 IMPLANT
TRAY FAXITRON CT DISP (TRAY / TRAY PROCEDURE) ×3 IMPLANT
TUBE CONNECTING 20'X1/4 (TUBING) ×1
TUBE CONNECTING 20X1/4 (TUBING) ×2 IMPLANT
YANKAUER SUCT BULB TIP NO VENT (SUCTIONS) ×3 IMPLANT

## 2018-03-24 NOTE — Discharge Instructions (Signed)
[place] Office Phone Number [phone]  BREAST BIOPSY/ PARTIAL MASTECTOMY: POST OP INSTRUCTIONS  Always review your discharge instruction sheet given to you by the facility where your surgery was performed.  IF YOU HAVE DISABILITY OR FAMILY LEAVE FORMS, YOU MUST BRING THEM TO THE OFFICE FOR PROCESSING.  DO NOT GIVE THEM TO YOUR DOCTOR.  1. A prescription for pain medication may be given to you upon discharge.  Take your pain medication as prescribed, if needed.  If narcotic pain medicine is not needed, then you may take acetaminophen (Tylenol) or ibuprofen (Advil) as needed. 2. Take your usually prescribed medications unless otherwise directed 3. If you need a refill on your pain medication, please contact your pharmacy.  They will contact our office to request authorization.  Prescriptions will not be filled after 5pm or on week-ends. 4. You should eat very light the first 24 hours after surgery, such as soup, crackers, pudding, etc.  Resume your normal diet the day after surgery. 5. Most patients will experience some swelling and bruising in the breast.  Ice packs and a good support bra will help.  Swelling and bruising can take several days to resolve.  6. It is common to experience some constipation if taking pain medication after surgery.  Increasing fluid intake and taking a stool softener will usually help or prevent this problem from occurring.  A mild laxative (Milk of Magnesia or Miralax) should be taken according to package directions if there are no bowel movements after 48 hours. 7. Unless discharge instructions indicate otherwise, you may remove your bandages 24-48 hours after surgery, and you may shower at that time.  You may have steri-strips (small skin tapes) in place directly over the incision.  These strips should be left on the skin for 7-10 days.  If your surgeon used skin glue on the incision, you may shower in 24 hours.  The glue will flake off over the  next 2-3 weeks.  Any sutures or staples will be removed at the office during your follow-up visit. 8. ACTIVITIES:  You may resume regular daily activities (gradually increasing) beginning the next day.  Wearing a good support bra or sports bra minimizes pain and swelling.  You may have sexual intercourse when it is comfortable. a. You may drive when you no longer are taking prescription pain medication, you can comfortably wear a seatbelt, and you can safely maneuver your car and apply brakes. b. RETURN TO WORK:  ______________________________________________________________________________________ 9. You should see your doctor in the office for a follow-up appointment approximately two weeks after your surgery.  Your doctors nurse will typically make your follow-up appointment when she calls you with your pathology report.  Expect your pathology report 2-3 business days after your surgery.  You may call to check if you do not hear from [place] after three days. 10. OTHER INSTRUCTIONS: _______________________________________________________________________________________________ _____________________________________________________________________________________________________________________________________ _____________________________________________________________________________________________________________________________________ _____________________________________________________________________________________________________________________________________  WHEN TO CALL YOUR DOCTOR: 1. Fever over 101.0 2. Nausea and/or vomiting. 3. Extreme swelling or bruising. 4. Continued bleeding from incision. 5. Increased pain, redness, or drainage from the incision.  The clinic staff is available to answer your questions during regular business hours.  Please dont hesitate to call and ask to speak to one of the nurses for clinical concerns.  If you have a medical emergency, go to the nearest  emergency room or call 911.  A surgeon from [facility] is always on call at the hospital.  For further questions, please visit [email or web address]  Use Tylenol and ibuprofen for pain as outlined below.  A prescription for hydrocodone has been called into your pharmacy in case you need something stronger         Managing Your Pain After Surgery Without Opioids    Thank you for participating in our program to help patients manage their pain after surgery without opioids. This is part of our effort to provide you with the best care possible, without exposing you or your family to the risk that opioids pose.  What pain can I expect after surgery? You can expect to have some pain after surgery. This is normal. The pain is typically worse the day after surgery, and quickly begins to get better. Many studies have found that many patients are able to manage their pain after surgery with Over-the-Counter (OTC) medications such as Tylenol and Motrin. If you have a condition that does not allow you to take Tylenol or Motrin, notify your surgical team.  How will I manage my pain? The best strategy for controlling your pain after surgery is around the clock pain control with Tylenol (acetaminophen) and Motrin (ibuprofen or Advil). Alternating these medications with each other allows you to maximize your pain control. In addition to Tylenol and Motrin, you can use heating pads or ice packs on your incisions to help reduce your pain.  How will I alternate your regular strength over-the-counter pain medication? You will take a dose of pain medication every three hours. ; Start by taking 650 mg of Tylenol (2 pills of 325 mg) ; 3 hours later take 600 mg of Motrin (3 pills of 200 mg) ; 3 hours after taking the Motrin take 650 mg of Tylenol ; 3 hours after that take 600 mg of Motrin.   - 1 -  See example - if your first dose of Tylenol is at 12:00  PM   12:00 PM Tylenol 650 mg (2 pills of 325 mg)  3:00 PM Motrin 600 mg (3 pills of 200 mg)  6:00 PM Tylenol 650 mg (2 pills of 325 mg)  9:00 PM Motrin 600 mg (3 pills of 200 mg)  Continue alternating every 3 hours   We recommend that you follow this schedule around-the-clock for at least 3 days after surgery, or until you feel that it is no longer needed. Use the table on the last page of this handout to keep track of the medications you are taking. Important: Do not take more than 3000mg  of Tylenol or 3200mg  of Motrin in a 24-hour period. Do not take ibuprofen/Motrin if you have a history of bleeding stomach ulcers, severe kidney disease, &/or actively taking a blood thinner  What if I still have pain? If you have pain that is not controlled with the over-the-counter pain medications (Tylenol and Motrin or Advil) you might have what we call breakthrough pain. You will receive a prescription for a small amount of an opioid pain medication such as Oxycodone, Tramadol, or Tylenol with Codeine. Use these opioid pills in the first 24 hours after surgery if you have breakthrough pain. Do not take more than 1 pill every 4-6 hours.  If you still have uncontrolled pain after using all opioid pills, don't hesitate to call our staff using the number provided. We will help make sure you are managing your pain in the best way possible, and if necessary, we can provide a prescription for additional pain medication.   Day 1    Time  Name of Medication Number  of pills taken  Amount of Acetaminophen  Pain Level   Comments  AM PM       AM PM       AM PM       AM PM       AM PM       AM PM       AM PM       AM PM       Total Daily amount of Acetaminophen Do not take more than  3,000 mg per day      Day 2    Time  Name of Medication Number of pills taken  Amount of Acetaminophen  Pain Level   Comments  AM PM       AM PM       AM PM       AM PM       AM PM       AM PM       AM  PM       AM PM       Total Daily amount of Acetaminophen Do not take more than  3,000 mg per day      Day 3    Time  Name of Medication Number of pills taken  Amount of Acetaminophen  Pain Level   Comments  AM PM       AM PM       AM PM       AM PM          AM PM       AM PM       AM PM       AM PM       Total Daily amount of Acetaminophen Do not take more than  3,000 mg per day      Day 4    Time  Name of Medication Number of pills taken  Amount of Acetaminophen  Pain Level   Comments  AM PM       AM PM       AM PM       AM PM       AM PM       AM PM       AM PM       AM PM       Total Daily amount of Acetaminophen Do not take more than  3,000 mg per day      Day 5    Time  Name of Medication Number of pills taken  Amount of Acetaminophen  Pain Level   Comments  AM PM       AM PM       AM PM       AM PM       AM PM       AM PM       AM PM       AM PM       Total Daily amount of Acetaminophen Do not take more than  3,000 mg per day       Day 6    Time  Name of Medication Number of pills taken  Amount of Acetaminophen  Pain Level  Comments  AM PM       AM PM       AM PM       AM PM       AM PM  AM PM       AM PM       AM PM       Total Daily amount of Acetaminophen Do not take more than  3,000 mg per day      Day 7    Time  Name of Medication Number of pills taken  Amount of Acetaminophen  Pain Level   Comments  AM PM       AM PM       AM PM       AM PM       AM PM       AM PM       AM PM       AM PM       Total Daily amount of Acetaminophen Do not take more than  3,000 mg per day        For additional information about how and where to safely dispose of unused opioid medications - [employer]  Disclaimer: This document contains information and/or instructional materials adapted from [facility] for the typical patient with your condition. It does not replace medical advice  from your health care provider because your experience may differ from that of the typical patient. Talk to your health care provider if you have any questions about this document, your condition or your treatment plan. Adapted from [facility] Instructions  Activity: Get plenty of rest for the remainder of the day. A responsible individual must stay with you for 24 hours following the procedure.  For the next 24 hours, DO NOT: -Drive a car -Paediatric nurse -Drink alcoholic beverages -Take any medication unless instructed by your physician -Make any legal decisions or sign important papers.  Meals: Start with liquid foods such as gelatin or soup. Progress to regular foods as tolerated. Avoid greasy, spicy, heavy foods. If nausea and/or vomiting occur, drink only clear liquids until the nausea and/or vomiting subsides. Call your physician if vomiting continues.  Special Instructions/Symptoms: Your throat may feel dry or sore from the anesthesia or the breathing tube placed in your throat during surgery. If this causes discomfort, gargle with warm salt water. The discomfort should disappear within 24 hours.  If you had a scopolamine patch placed behind your ear for the management of post- operative nausea and/or vomiting:  1. The medication in the patch is effective for 72 hours, after which it should be removed.  Wrap patch in a tissue and discard in the trash. Wash hands thoroughly with soap and water. 2. You may remove the patch earlier than 72 hours if you experience unpleasant side effects which may include dry mouth, dizziness or visual disturbances. 3. Avoid touching the patch. Wash your hands with soap and water after contact with the patch.     Regional Anesthesia Blocks  1. Numbness or the inability to move the "blocked" extremity may last from 3-48 hours after placement. The length of time depends on the medication injected and your  individual response to the medication. If the numbness is not going away after 48 hours, call your surgeon.  2. The extremity that is blocked will need to be protected until the numbness is gone and the  Strength has returned. Because you cannot feel it, you will need to take extra care to avoid injury. Because it may be weak, you may have difficulty moving it or using it. You may not know what position it is  in without looking at it while the block is in effect.  3. For blocks in the legs and feet, returning to weight bearing and walking needs to be done carefully. You will need to wait until the numbness is entirely gone and the strength has returned. You should be able to move your leg and foot normally before you try and bear weight or walk. You will need someone to be with you when you first try to ensure you do not fall and possibly risk injury.  4. Bruising and tenderness at the needle site are common side effects and will resolve in a few days.  5. Persistent numbness or new problems with movement should be communicated to the surgeon or the [facility] [phone] [place] [phone]).

## 2018-03-24 NOTE — Anesthesia Procedure Notes (Signed)
Anesthesia Regional Block: Pectoralis block   Pre-Anesthetic Checklist: ,, timeout performed, Correct Patient, Correct Site, Correct Laterality, Correct Procedure, Correct Position, site marked, Risks and benefits discussed,  Surgical consent,  Pre-op evaluation,  At surgeon's request and post-op pain management  Laterality: Right  Prep: chloraprep       Needles:  Injection technique: Single-shot  Needle Type: Echogenic Stimulator Needle     Needle Length: 10cm  Needle Gauge: 21   Needle insertion depth: 3 cm   Additional Needles:   Procedures:,,,, ultrasound used (permanent image in chart),,,,  Narrative:  Start time: 03/24/2018 7:20 AM End time: 03/24/2018 7:30 AM Injection made incrementally with aspirations every 5 mL.  Performed by: Personally  Anesthesiologist: Lyn Hollingshead, MD

## 2018-03-24 NOTE — Anesthesia Postprocedure Evaluation (Signed)
Anesthesia Post Note  Patient: Sandra Shelton  Procedure(s) Performed: RIGHT BREAST LUMPECTOMY WITH RADIOACTIVE SEED AND RIGHT AXILLARY DEEP SENTINEL LYMPH NODE BIOPSY INJECT BLUE DYE RIGHT BREAST (Right Breast)     Patient location during evaluation: PACU Anesthesia Type: General Level of consciousness: awake and sedated Pain management: pain level controlled Vital Signs Assessment: post-procedure vital signs reviewed and stable Respiratory status: spontaneous breathing Cardiovascular status: stable Postop Assessment: no apparent nausea or vomiting Anesthetic complications: no    Last Vitals:  Vitals:   03/24/18 0945 03/24/18 1000  BP: (!) 163/89   Pulse: 80 81  Resp: 18 18  Temp:    SpO2: 99% 100%    Last Pain:  Vitals:   03/24/18 1000  TempSrc:   PainSc: 0-No pain   Pain Goal:                   Huston Foley

## 2018-03-24 NOTE — Progress Notes (Signed)
Assisted Dr. Jillyn Hidden with right, ultrasound guided, pectoralis block. Side rails up, monitors on throughout procedure. See vital signs in flow sheet. Tolerated Procedure well.

## 2018-03-24 NOTE — Progress Notes (Signed)
Nuc med staff performed nuc med inj. No additional sedation required. Pt tol well. Will see if sister is back and invite her to bedside/update/provide emotional support

## 2018-03-24 NOTE — Interval H&P Note (Signed)
History and Physical Interval Note:  03/24/2018 7:13 AM  Sandra Shelton  has presented today for surgery, with the diagnosis of RIGHT BREAST CANCER.  The various methods of treatment have been discussed with the patient and family. After consideration of risks, benefits and other options for treatment, the patient has consented to  Procedure(s): RIGHT BREAST LUMPECTOMY WITH RADIOACTIVE SEED AND RIGHT AXILLARY DEEP SENTINEL LYMPH NODE BIOPSY INJECT BLUE DYE RIGHT BREAST (Right) as a surgical intervention.  The patient's history has been reviewed, patient examined, no change in status, stable for surgery.  I have reviewed the patient's chart and labs.  Questions were answered to the patient's satisfaction.     Adin Hector

## 2018-03-24 NOTE — Op Note (Addendum)
Patient Name:           Sandra Shelton   Date of Surgery:        03/24/2018  Pre op Diagnosis:      Invasive ductal carcinoma right breast, 9 o'clock position, estrogen receptor positive  Post op Diagnosis:   Invasive ductal carcinoma right breast, 9 o'clock position, estrogen receptor positive  Procedure:                 Same  Surgeon:                     Edsel Petrin. Dalbert Batman, M.D., FACS  Procedure:                      Inject blue dye right breast                                        Right breast lumpectomy with radioactive seed localization                                        Reexcision posterior margin, superior margin, anterior margin                                        Right axillary deep sentinel lymph node biopsy     Operative Indications:       . This is a 63 year old female, referred by Dr. Danna Hefty at the [facility] for evaluation and management of newly diagnosed invasive cancer right breast, upper outer quadrant. Sandra Shelton is her PCP. Dr. Oneida Alar performed her carotid surgery and has placed her on Plavix. She was seen in the multidisciplinary breast clinic  by Dr. Lindi Adie, Dr. Sondra Come, and me.     Last mammogram 5 years ago. Recent screening mammogram show an 8 mm mass in the right breast laterally. The left breast looks fine. The right axilla looks fine. 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 ER 100%. PR 70%. Ki-67 20%. HER-2 positive. She developed a significant hematoma, 6 cm.     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 She underwent trans-carotid graft on the left side by Dr. Oneida Alar. She takes Plavix and aspirin. Insulin-dependent diabetes. GERD. Hypertension.     We had a very long discussion. She seems to understand her diagnosis and the need for surgical intervention and multidisciplinary care. We talked about lumpectomy, sentinel node biopsy and compared that the mastectomy with or  without reconstruction. She is interested in breast conservation if it is equivalent in long-term survival. I think she is an excellent candidate for that.     She'll be scheduled for right breast lumpectomy with radioactive seed localization and right axillary sentinel node biopsy. She knows she will need whole breast radiation therapy and anti-estrogens. Decisions about Herceptin-based chemotherapy will be made after surgery was to see what the final pathology report shows  Operative Findings:       There was still a small hematoma in the 9 o'clock position of the right breast.  This was less than 2 cm in diameter, however.  The radioactive seed was 2 or 3 cm deep within  the breast.  The specimen mammogram, interestingly, suggested that the seed and marker clip were close on the anterior margin.  I reexcised the anterior margin, superior margin, and posterior margin due to the palpable finding of the hematoma in those areas.  At this point the anterior margin is essentially the skin.    I found 3 or 4 sentinel lymph nodes which were sent separately.  Procedure in Detail:          Following the induction of general LMA anesthesia the patient received intravenous antibiotics.  A surgical timeout was performed.  Following alcohol prep I injected 5 cc of dilute dilute methylene blue into the right breast and massaged the breast for a few minutes.  The entire right chest wall and axilla were then prepped and draped in a sterile fashion.     Using the neoprobe I found the radioactive signal at the 9 o'clock position of the right breast, about 5 cm lateral to the areolar margin.  I chose to make a radially oriented linear incision at the 9 o'clock position of the right breast.  Lumpectomy was performed using the neoprobe and electrocautery.  Specimen was removed.  The specimen contained the radioactive seed and the original marker clip.  I reexcised the anterior margin, superior margin, and posterior margin  and sent those as separate specimens.  The wound was irrigated.  Hemostasis was excellent.  I placed 4 or 5 metal marker clips in the walls of the lumpectomy cavity.  Lumpectomy cavity was closed in layers with interrupted 3-0 Vicryl and the skin closed with a running subcuticular 4-0 Monocryl and Dermabond.      I then made a transverse incision in the right axilla.  Dissection was carried down through the subcutaneous tissue and the clavipectoral fascia was incised.  I found 3 or 4 sentinel lymph nodes mostly by blue color and slightly by radioactivity.  There was no palpable abnormality in the right axilla.  Hemostasis was excellent and achieved with electrocautery.  The wound was irrigated.  Clavipectoral fascia was closed with 3-0 Vicryl sutures and the skin closed with a running subcuticular 4-0 Monocryl and Dermabond.  Dry bandages and a breast binder were placed.  The patient tolerated the procedure well and was taken to PACU in stable condition.  EBL 25 cc.  Counts correct.  Complications none.    Addendum: I logged onto the PMP aware website and reviewed her prescription medication history     Sandra Shelton M. Dalbert Batman, M.D., FACS General and Minimally Invasive Surgery Breast and Colorectal Surgery  03/24/2018 9:10 AM

## 2018-03-24 NOTE — Transfer of Care (Signed)
Immediate Anesthesia Transfer of Care Note  Patient: Sandra Shelton  Procedure(s) Performed: RIGHT BREAST LUMPECTOMY WITH RADIOACTIVE SEED AND RIGHT AXILLARY DEEP SENTINEL LYMPH NODE BIOPSY INJECT BLUE DYE RIGHT BREAST (Right Breast)  Patient Location: PACU  Anesthesia Type:GA combined with regional for post-op pain  Level of Consciousness: drowsy and patient cooperative  Airway & Oxygen Therapy: Patient Spontanous Breathing and Patient connected to face mask oxygen  Post-op Assessment: Report given to RN and Post -op Vital signs reviewed and stable  Post vital signs: Reviewed and stable  Last Vitals:  Vitals Value Taken Time  BP    Temp    Pulse 69 03/24/2018  9:14 AM  Resp 12 03/24/2018  9:14 AM  SpO2 95 % 03/24/2018  9:14 AM  Vitals shown include unvalidated device data.  Last Pain:  Vitals:   03/24/18 0634  TempSrc: Oral  PainSc: 0-No pain         Complications: No apparent anesthesia complications

## 2018-03-24 NOTE — Telephone Encounter (Signed)
-----   Message from Larey Seat, MD sent at 03/20/2018  5:32 PM EST ----- 1. Complex, mostly Obstructive Sleep Apnea (OSA) at a moderate degree. AHI was 14.1/h,not supine dependent and strongly REM sleep dependent. AHI in REM was 37/h. 2. Thunderous snoring - Snoring was recorded before the patient fell asleep, in drowsiness.  3. No sleep hypoxemia.  4. normal EKG Advise full-night, attended, CPAP titration study to optimize therapy. REM dependent sleep apnea is less likely responsive to dental devices.

## 2018-03-24 NOTE — Telephone Encounter (Signed)
Called the patient and spoke with her sister on the [place]. Advised the sister the results and that the patient would need to come in for a titration study to assess what pressure will be needed to treat her apnea. Patient's sister states that she will pass this information along and have her call us back with whatever questions she may have.

## 2018-03-24 NOTE — Anesthesia Procedure Notes (Signed)
Procedure Name: LMA Insertion Date/Time: 03/24/2018 7:46 AM Performed by: Signe Colt, CRNA Pre-anesthesia Checklist: Patient identified, Emergency Drugs available, Suction available and Patient being monitored Patient Re-evaluated:Patient Re-evaluated prior to induction Oxygen Delivery Method: Circle system utilized Preoxygenation: Pre-oxygenation with 100% oxygen Induction Type: IV induction Ventilation: Mask ventilation without difficulty LMA: LMA inserted LMA Size: 4.0 Number of attempts: 1 Airway Equipment and Method: Bite block Placement Confirmation: positive ETCO2 Tube secured with: Tape Dental Injury: Teeth and Oropharynx as per pre-operative assessment

## 2018-03-25 ENCOUNTER — Encounter (HOSPITAL_BASED_OUTPATIENT_CLINIC_OR_DEPARTMENT_OTHER): Payer: Self-pay | Admitting: General Surgery

## 2018-03-25 DIAGNOSIS — I10 Essential (primary) hypertension: Secondary | ICD-10-CM | POA: Diagnosis not present

## 2018-03-25 DIAGNOSIS — C50411 Malignant neoplasm of upper-outer quadrant of right female breast: Secondary | ICD-10-CM | POA: Diagnosis not present

## 2018-03-25 DIAGNOSIS — Z483 Aftercare following surgery for neoplasm: Secondary | ICD-10-CM | POA: Diagnosis not present

## 2018-03-25 DIAGNOSIS — E119 Type 2 diabetes mellitus without complications: Secondary | ICD-10-CM | POA: Diagnosis not present

## 2018-03-25 DIAGNOSIS — Z170 Estrogen receptor positive status [ER+]: Secondary | ICD-10-CM | POA: Diagnosis not present

## 2018-03-25 DIAGNOSIS — Z794 Long term (current) use of insulin: Secondary | ICD-10-CM | POA: Diagnosis not present

## 2018-03-25 DIAGNOSIS — Z8673 Personal history of transient ischemic attack (TIA), and cerebral infarction without residual deficits: Secondary | ICD-10-CM | POA: Diagnosis not present

## 2018-03-25 NOTE — Progress Notes (Signed)
Inform patient of Pathology report,. Breast cancer was 1.2 cm in diameter. All margins are negative All lymph nodes are negative This is good news She will not need any further surgery let me know that you reached her   Sandra Shelton

## 2018-03-27 DIAGNOSIS — I10 Essential (primary) hypertension: Secondary | ICD-10-CM | POA: Diagnosis not present

## 2018-03-27 DIAGNOSIS — Z170 Estrogen receptor positive status [ER+]: Secondary | ICD-10-CM | POA: Diagnosis not present

## 2018-03-27 DIAGNOSIS — Z794 Long term (current) use of insulin: Secondary | ICD-10-CM | POA: Diagnosis not present

## 2018-03-27 DIAGNOSIS — Z483 Aftercare following surgery for neoplasm: Secondary | ICD-10-CM | POA: Diagnosis not present

## 2018-03-27 DIAGNOSIS — C50411 Malignant neoplasm of upper-outer quadrant of right female breast: Secondary | ICD-10-CM | POA: Diagnosis not present

## 2018-03-27 DIAGNOSIS — E119 Type 2 diabetes mellitus without complications: Secondary | ICD-10-CM | POA: Diagnosis not present

## 2018-03-29 DIAGNOSIS — C50411 Malignant neoplasm of upper-outer quadrant of right female breast: Secondary | ICD-10-CM | POA: Diagnosis not present

## 2018-03-29 DIAGNOSIS — Z170 Estrogen receptor positive status [ER+]: Secondary | ICD-10-CM | POA: Diagnosis not present

## 2018-03-29 DIAGNOSIS — I10 Essential (primary) hypertension: Secondary | ICD-10-CM | POA: Diagnosis not present

## 2018-03-29 DIAGNOSIS — Z483 Aftercare following surgery for neoplasm: Secondary | ICD-10-CM | POA: Diagnosis not present

## 2018-03-29 DIAGNOSIS — E119 Type 2 diabetes mellitus without complications: Secondary | ICD-10-CM | POA: Diagnosis not present

## 2018-03-29 DIAGNOSIS — Z794 Long term (current) use of insulin: Secondary | ICD-10-CM | POA: Diagnosis not present

## 2018-03-30 NOTE — Assessment & Plan Note (Signed)
02/04/2018:Screening mammogram detected right breast mass and asymmetry in the left breast.  The left breast asymmetry resolved.  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%, HER-2 +3+ by IHC, T1BN0 stage Ia clinical stage  03/24/2018: Rt BCS: Grade 2 IDC 1.2 cm  ER 100%, PR 70%, Ki-67 20%, HER-2 +3+ by IHC, T1BN0 stage Ia Left BCS: DCIS  Pathology counseling: I discussed the final pathology report of the patient provided  a copy of this report. I discussed the margins as well as lymph node surgeries. We also discussed the final staging along with previously performed ER/PR and HER-2/neu testing.  Recommendations: 1. Adj chemo with Taxol Herceptin weekly X 12 foll by Herceptin Q 3 weeks for 1 year 2. Adjuvant radiation therapy followed by 3. Adjuvant antiestrogen therapy  RTC to start chemo

## 2018-03-31 ENCOUNTER — Telehealth: Payer: Self-pay | Admitting: Hematology and Oncology

## 2018-03-31 ENCOUNTER — Other Ambulatory Visit: Payer: Self-pay | Admitting: *Deleted

## 2018-03-31 ENCOUNTER — Other Ambulatory Visit: Payer: Self-pay

## 2018-03-31 ENCOUNTER — Inpatient Hospital Stay: Payer: Medicare Other | Attending: Hematology and Oncology | Admitting: Hematology and Oncology

## 2018-03-31 ENCOUNTER — Other Ambulatory Visit: Payer: Self-pay | Admitting: General Surgery

## 2018-03-31 VITALS — BP 157/60 | HR 85 | Temp 98.3°F | Resp 20 | Ht 65.0 in | Wt 184.3 lb

## 2018-03-31 DIAGNOSIS — Z7982 Long term (current) use of aspirin: Secondary | ICD-10-CM | POA: Diagnosis not present

## 2018-03-31 DIAGNOSIS — Z170 Estrogen receptor positive status [ER+]: Secondary | ICD-10-CM

## 2018-03-31 DIAGNOSIS — Z923 Personal history of irradiation: Secondary | ICD-10-CM | POA: Insufficient documentation

## 2018-03-31 DIAGNOSIS — Z79899 Other long term (current) drug therapy: Secondary | ICD-10-CM | POA: Diagnosis not present

## 2018-03-31 DIAGNOSIS — C50411 Malignant neoplasm of upper-outer quadrant of right female breast: Secondary | ICD-10-CM

## 2018-03-31 DIAGNOSIS — Z794 Long term (current) use of insulin: Secondary | ICD-10-CM | POA: Insufficient documentation

## 2018-03-31 DIAGNOSIS — Z9221 Personal history of antineoplastic chemotherapy: Secondary | ICD-10-CM | POA: Insufficient documentation

## 2018-03-31 MED FILL — LISINOPRIL 5 MG TAB: 5 | 30 days supply | Qty: 30 | Fill #3

## 2018-03-31 MED FILL — CLOPIDOGREL 75 MG TABLET: 75 | 30 days supply | Qty: 30 | Fill #5

## 2018-03-31 MED FILL — AMLODIPINE BESYLATE 10 MG T: 10 | 30 days supply | Qty: 30 | Fill #5

## 2018-03-31 MED FILL — ?METFORMIN HCL 500MG TABL: 500 | 30 days supply | Qty: 60 | Fill #5

## 2018-03-31 MED FILL — ROSUVASTATIN CALCIUM 40 MG: 40 | 30 days supply | Qty: 30 | Fill #3

## 2018-03-31 NOTE — Telephone Encounter (Signed)
Gave avs and calendar ° °

## 2018-03-31 NOTE — Progress Notes (Signed)
Patient Care Team: [name], MD as PCP - General (Internal Medicine) Fanny Skates, MD as Consulting Physician (General Surgery) Nicholas Lose, MD as Consulting Physician (Hematology and Oncology) Gery Pray, MD as Consulting Physician (Radiation Oncology)  DIAGNOSIS:  Encounter Diagnosis  Name Primary?  . Malignant neoplasm of upper-outer quadrant of right breast in female, estrogen receptor positive ([place])     SUMMARY OF ONCOLOGIC HISTORY:   Malignant neoplasm of upper-outer quadrant of right breast in female, estrogen receptor positive ([place])   01/27/2018 Mammogram    Screening Mammogram  Possible right breast mass; asymmetry in left breast.    02/10/2018 Initial Diagnosis    Screening mammogram detected right breast mass and asymmetry in the left breast.  The left breast asymmetry resolved.  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%, HER-2 +3+ by IHC, T1BN0 stage Ia clinical stage    03/24/2018 Surgery    Lumpectomy and SLNB: IDC, 1.2cm, grade 2, margins negative, 4 SLN negative, T1cN0, previously ER/PR/HER-2 positive with Ki-67 of 20%    03/24/2018 Cancer Staging    Staging form: Breast, AJCC 8th Edition - Pathologic stage from 03/24/2018: Stage IA (pT1c, pN0, cM0, G2, ER+, PR+, HER2+) - Signed by Gardenia Phlegm, NP on 03/26/2018     CHIEF COMPLIANT: Follow-up after recent lumpectomy  INTERVAL HISTORY: Evynn Boutelle is a 63 year old above-mentioned history of right breast cancer underwent lumpectomy and is here today to discuss the pathology report.  She is healing and recovering very well from recent surgery.  REVIEW OF SYSTEMS:   Constitutional: Denies fevers, chills or abnormal weight loss Eyes: Denies blurriness of vision Ears, nose, mouth, throat, and face: Denies mucositis or sore throat Respiratory: Denies cough, dyspnea or wheezes  Cardiovascular: Denies palpitation, chest discomfort Gastrointestinal:  Denies nausea, heartburn or change in bowel habits Skin: Denies abnormal skin rashes Lymphatics: Denies new lymphadenopathy or easy bruising Neurological:Denies numbness, tingling or new weaknesses Behavioral/Psych: Mood is stable, no new changes  Extremities: No lower extremity edema Breast: Recent right lumpectomy All other systems were reviewed with the patient and are negative.  I have reviewed the past medical history, past surgical history, social history and family history with the patient and they are unchanged from previous note.  ALLERGIES:  has No Known Allergies.  MEDICATIONS:  Current Outpatient Medications  Medication Sig Dispense Refill  . amLODipine (NORVASC) 10 MG tablet Take 1 tablet (10 mg total) by mouth daily. 30 tablet 6  . aspirin 81 MG tablet Take 1 tablet (81 mg total) by mouth daily. 100 tablet 2  . blood glucose meter kit and supplies KIT Dispense based on patient and insurance preference. Use up to four times daily as directed. (FOR ICD-9 250.00, 250.01). 1 each 0  . clopidogrel (PLAVIX) 75 MG tablet Take 1 tablet (75 mg total) by mouth daily. 30 tablet 6  . HYDROcodone-acetaminophen (NORCO) 5-325 MG tablet Take 1-2 tablets by mouth every 6 (six) hours as needed. 20 tablet 0  . INSULIN SYRINGE .5CC/29G 29G X 1/2" 0.5 ML MISC For insulin injection 100 each 0  . LANTUS SOLOSTAR 100 UNIT/ML Solostar Pen Inject 7 Units into the skin 2 (two) times daily. 15 mL 6  . lisinopril (PRINIVIL,ZESTRIL) 5 MG tablet Take 1 tablet (5 mg total) by mouth daily. 90 tablet 6  . metFORMIN (GLUCOPHAGE) 500 MG tablet Take 1 tablet (500 mg  total) by mouth 2 (two) times daily with a meal. 60 tablet 6  . Syringe/Needle, Disp, (SYRINGE 3CC/27GX1-1/4") 27G X 1-1/4" 3 ML MISC For insulin injection 100 each 0  . topiramate (TOPAMAX) 25 MG tablet Take 1 tablet (25 mg total) by mouth every evening. 30 tablet 6   No  current facility-administered medications for this visit.     PHYSICAL EXAMINATION: ECOG PERFORMANCE STATUS: 1 - Symptomatic but completely ambulatory  Vitals:   03/31/18 1454  BP: (!) 157/60  Pulse: 85  Resp: 20  Temp: 98.3 F (36.8 C)  SpO2: 100%   Filed Weights   03/31/18 1454  Weight: 184 lb 4.8 oz (83.6 kg)    GENERAL:alert, no distress and comfortable SKIN: skin color, texture, turgor are normal, no rashes or significant lesions EYES: normal, Conjunctiva are pink and non-injected, sclera clear OROPHARYNX:no exudate, no erythema and lips, buccal mucosa, and tongue normal  NECK: supple, thyroid normal size, non-tender, without nodularity LYMPH:  no palpable lymphadenopathy in the cervical, axillary or inguinal LUNGS: clear to auscultation and percussion with normal breathing effort HEART: regular rate & rhythm and no murmurs and no lower extremity edema ABDOMEN:abdomen soft, non-tender and normal bowel sounds MUSCULOSKELETAL:no cyanosis of digits and no clubbing  NEURO: alert & oriented x 3 with fluent speech, no focal motor/sensory deficits EXTREMITIES: No lower extremity edema   LABORATORY DATA:  I have reviewed the data as listed CMP Latest Ref Rng & Units 03/20/2018 02/12/2018 09/09/2017  Glucose 70 - 99 mg/dL 79 85 -  BUN 8 - 23 mg/dL 10 17 -  Creatinine 0.44 - 1.00 mg/dL 0.69 0.96 -  Sodium 135 - 145 mmol/L 139 142 -  Potassium 3.5 - 5.1 mmol/L 4.2 4.2 -  Chloride 98 - 111 mmol/L 106 110 -  CO2 22 - 32 mmol/L 23 23 -  Calcium 8.9 - 10.3 mg/dL 9.6 9.8 10.4(H)  Total Protein 6.5 - 8.1 g/dL 7.8 7.7 -  Total Bilirubin 0.3 - 1.2 mg/dL 0.4 0.3 -  Alkaline Phos 38 - 126 U/L 57 60 -  AST 15 - 41 U/L 25 17 -  ALT 0 - 44 U/L 23 22 -    Lab Results  Component Value Date   WBC 7.3 03/20/2018   HGB 13.8 03/20/2018   HCT 42.1 03/20/2018   MCV 86.3 03/20/2018   PLT 289 03/20/2018   NEUTROABS 3.7 03/20/2018    ASSESSMENT & PLAN:  Malignant neoplasm of upper-outer  quadrant of right breast in female, estrogen receptor positive ([place]) 02/04/2018:Screening mammogram detected right breast mass and asymmetry in the left breast.  The left breast asymmetry resolved.  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%, HER-2 +3+ by IHC, T1BN0 stage Ia clinical stage  03/24/2018: Rt BCS: Grade 2 IDC 1.2 cm  ER 100%, PR 70%, Ki-67 20%, HER-2 +3+ by IHC, T1BN0 stage Ia Left BCS: DCIS  Pathology counseling: I discussed the final pathology report of the patient provided  a copy of this report. I discussed the margins as well as lymph node surgeries. We also discussed the final staging along with previously performed ER/PR and HER-2/neu testing.  Recommendations: 1. Adj chemo with Taxol Herceptin weekly X 12 foll by Herceptin Q 3 weeks for 1 year 2. Adjuvant radiation therapy followed by 3. Adjuvant antiestrogen therapy  RTC to start chemo April 7. I sent a message  to Dr. Dalbert Batman for port placement. Patient will need an echocardiogram and chemo class. Return to clinic on the day of her chemo.    No orders of the defined types were placed in this encounter.  The patient has a good understanding of the overall plan. she agrees with it. she will call with any problems that may develop before the next visit here.   Harriette Ohara, MD 03/31/18

## 2018-04-01 ENCOUNTER — Encounter: Payer: Self-pay | Admitting: *Deleted

## 2018-04-01 DIAGNOSIS — I10 Essential (primary) hypertension: Secondary | ICD-10-CM | POA: Diagnosis not present

## 2018-04-01 DIAGNOSIS — C50411 Malignant neoplasm of upper-outer quadrant of right female breast: Secondary | ICD-10-CM | POA: Diagnosis not present

## 2018-04-01 DIAGNOSIS — Z170 Estrogen receptor positive status [ER+]: Secondary | ICD-10-CM | POA: Diagnosis not present

## 2018-04-01 DIAGNOSIS — Z483 Aftercare following surgery for neoplasm: Secondary | ICD-10-CM | POA: Diagnosis not present

## 2018-04-01 DIAGNOSIS — Z794 Long term (current) use of insulin: Secondary | ICD-10-CM | POA: Diagnosis not present

## 2018-04-01 DIAGNOSIS — E119 Type 2 diabetes mellitus without complications: Secondary | ICD-10-CM | POA: Diagnosis not present

## 2018-04-02 ENCOUNTER — Encounter (HOSPITAL_BASED_OUTPATIENT_CLINIC_OR_DEPARTMENT_OTHER): Payer: Self-pay | Admitting: *Deleted

## 2018-04-02 ENCOUNTER — Other Ambulatory Visit: Payer: Self-pay

## 2018-04-02 NOTE — Progress Notes (Signed)
G2 given to patient with instructions to complete by 0515 DOS.  Surgical soap also given to patient with instructions for use.  Patient verbalized understanding of instructions.

## 2018-04-03 DIAGNOSIS — Z170 Estrogen receptor positive status [ER+]: Secondary | ICD-10-CM | POA: Diagnosis not present

## 2018-04-03 DIAGNOSIS — E119 Type 2 diabetes mellitus without complications: Secondary | ICD-10-CM | POA: Diagnosis not present

## 2018-04-03 DIAGNOSIS — Z483 Aftercare following surgery for neoplasm: Secondary | ICD-10-CM | POA: Diagnosis not present

## 2018-04-03 DIAGNOSIS — Z794 Long term (current) use of insulin: Secondary | ICD-10-CM | POA: Diagnosis not present

## 2018-04-03 DIAGNOSIS — C50411 Malignant neoplasm of upper-outer quadrant of right female breast: Secondary | ICD-10-CM | POA: Diagnosis not present

## 2018-04-03 DIAGNOSIS — I10 Essential (primary) hypertension: Secondary | ICD-10-CM | POA: Diagnosis not present

## 2018-04-06 NOTE — H&P (Signed)
Sandra Shelton  Location: [facility] Patient #: [id number] DOB: [birth year] Undefined / Language: [name] / Race: Black or African American Female       History of Present Illness  The patient is a 63 year old female who presents with breast cancer. This is a 63 year old female, referred by Dr. Danna Hefty at the [facility] for evaluation and management of newly diagnosed invasive cancer right breast, upper outer quadrant. [name] is her PCP. Dr. Oneida Alar performed her carotid surgery and has placed her on Plavix. She is seen in the multidisciplinary breast clinic today by Dr. Lindi Adie, Dr. Sondra Come, and me. The entire encounter was chaperoned by the [facility] volunteer today  Last mammogram 5 years ago. Recent screening mammogram show an 8 mm mass in the right breast laterally. The left breast looks fine. The right axilla looks fine. 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 ER 100%. PR 70%. Ki-67 20%. HER-2 positive. She developed a significant hematoma  Past history significant for cerebrovascular accident 8144 felt to be embolic. She underwent trans-carotid graft on the left side by Dr. Oneida Alar. She takes Plavix and aspirin. Insulin-dependent diabetes. GERD. Hypertension. Surgery for uterine fibroids. Family history reveals a paternal aunt with breast cancer. Otherwise no cancer syndromes. Father may have had prostate cancer but is in his 15s is still living. Mother died of a stroke and diabetes. Social history she is single. No children. Lives in [place]. Lives alone. Denies tobacco. Unemployed. She has a sister nearby that can help her.  We had a very long discussion. She seems to understand her diagnosis and the need for surgical intervention and multidisciplinary care. We talked about lumpectomy, sentinel node biopsy and compared that the mastectomy with or without reconstruction. She is interested in breast  conservation if it is equivalent in long-term survival. I think she is an excellent candidate for that.  She'll be scheduled for right breast lumpectomy with radioactive seed localization and right axillary sentinel node biopsy. I discussed the indications, details, techniques, and numerous risk of the surgery with her. She is aware of the risk of bleeding, infection, reoperation for positive margins or positive nodes, cosmetic deformity, nerve damage with chronic pain, cardiac pulmonary and thromboembolic problems. She understands all these issues. All of her questions are answered. She agrees with this plan.  She will need to stop her Plavix a full 5 days preop and we will request permission for that from Dr. fields and from her PCP if everything goes well this will be outpatient surgery She knows she will need whole breast radiation therapy and anti-estrogens. Decisions about Herceptin-based chemotherapy will be made after surgery was to see what the final pathology report shows        Past Surgical History Breast Biopsy  Right.  Diagnostic Studies History Mammogram  within last year Pap Smear  1-5 years ago  Medication History Medications Reconciled  Social History Alcohol use  Remotely quit alcohol use. Caffeine use  Coffee. No drug use  Tobacco use  Never smoker.  Family History  Arthritis  Family Members In General, Father, Mother. Breast Cancer  Sister. Cerebrovascular Accident  Father, Mother. Colon Cancer  Father. Diabetes Mellitus  Brother, Sister.  Pregnancy / Birth History  Age at menarche  54 years. Age of menopause  34-55 Gravida  0 Irregular periods   Other Problems  Cerebrovascular Accident  Diabetes Mellitus  Gastroesophageal Reflux Disease  Heart murmur  High blood pressure  Lump In  Breast     Review of Systems  General Not Present- Appetite Loss, Chills, Fatigue, Fever, Night Sweats, Weight Gain and Weight  Loss. Skin Not Present- Change in Wart/Mole, Dryness, Hives, Jaundice, New Lesions, Non-Healing Wounds, Rash and Ulcer. Respiratory Present- Snoring. Not Present- Bloody sputum, Chronic Cough, Difficulty Breathing and Wheezing. Breast Not Present- Breast Mass, Breast Pain, Nipple Discharge and Skin Changes. Cardiovascular Not Present- Chest Pain, Difficulty Breathing Lying Down, Leg Cramps, Palpitations, Rapid Heart Rate, Shortness of Breath and Swelling of Extremities. Gastrointestinal Not Present- Abdominal Pain, Bloating, Bloody Stool, Change in Bowel Habits, Chronic diarrhea, Constipation, Difficulty Swallowing, Excessive gas, Gets full quickly at meals, Hemorrhoids, Indigestion, Nausea, Rectal Pain and Vomiting. Female Genitourinary Not Present- Frequency, Nocturia, Painful Urination, Pelvic Pain and Urgency.   Physical Exam General Mental Status-Alert. General Appearance-Consistent with stated age. Hydration-Well hydrated. Voice-Normal.  Head and Neck Head-normocephalic, atraumatic with no lesions or palpable masses. Trachea-midline. Thyroid Gland Characteristics - normal size and consistency. Note: Transverse scar left supraclavicular area   Eye Eyeball - Bilateral-Extraocular movements intact. Sclera/Conjunctiva - Bilateral-No scleral icterus.  Chest and Lung Exam Chest and lung exam reveals -quiet, even and easy respiratory effort with no use of accessory muscles and on auscultation, normal breath sounds, no adventitious sounds and normal vocal resonance. Inspection Chest Wall - Normal. Back - normal.  Breast Note: 6 cm hematoma right breast upper outer quadrant with ecchymoses. No other skin change or palpable mass in either breast. No palpable axillary adenopathy.   Cardiovascular Cardiovascular examination reveals -normal heart sounds, regular rate and rhythm with no murmurs and normal pedal pulses bilaterally.  Abdomen Inspection Inspection  of the abdomen reveals - No Hernias. Skin - Scar - no surgical scars. Palpation/Percussion Palpation and Percussion of the abdomen reveal - Soft, Non Tender, No Rebound tenderness, No Rigidity (guarding) and No hepatosplenomegaly. Auscultation Auscultation of the abdomen reveals - Bowel sounds normal.  Neurologic Neurologic evaluation reveals -alert and oriented x 3 with no impairment of recent or remote memory. Mental Status-Normal.  Musculoskeletal Normal Exam - Left-Upper Extremity Strength Normal and Lower Extremity Strength Normal. Normal Exam - Right-Upper Extremity Strength Normal and Lower Extremity Strength Normal.  Lymphatic Head & Neck  General Head & Neck Lymphatics: Bilateral - Description - Normal. Axillary  General Axillary Region: Bilateral - Description - Normal. Tenderness - Non Tender. Femoral & Inguinal  Generalized Femoral & Inguinal Lymphatics: Bilateral - Description - Normal. Tenderness - Non Tender.    Assessment & Plan  PRIMARY CANCER OF UPPER OUTER QUADRANT OF RIGHT FEMALE BREAST (C50.411)  He'll restart recent imaging studies and biopsy show a small cancer in the right breast The mammograms and ultrasound show an 8 mm mass in the right breast laterally. The left breast looks normal Ultrasound of the right axilla showed no enlarged lymph nodes  Biopsy shows invasive ductal carcinoma. The tumor is sensitive to estrogen and progesterone. The tumor is also HER-2 positive. you will need to have surgery to control your cancer you also need radiation therapy You may or may not need chemotherapy. That decision will be made later  We talked about surgical options including lumpectomy, sentinel lymph node biopsy. We compared that to mastectomy with or without reconstruction. There does not appear to be increased survival advantage to mastectomy We have decided to proceed with right breast lumpectomy with radioactive seed localization and right  axillary sentinel lymph node biopsy Dr. Dalbert Batman wants to postpone the surgery for about 3 weeks because of the hematoma  in your right breast I have discussed the indications, techniques, and risk of the surgery in detail with you  You will need to stop your Plavix blood thinner a full 5 days preop We will ask your primary care physician and Dr. 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 Trans-carotid grafting performed by Dr. Oneida Alar. 2019. Takes Plavix and aspirin ANTICOAGULATED (Z79.01) Impression: Plavix and aspirin HYPERTENSION, BENIGN (I10) H/O: MYOMECTOMY (J95.369)    Linley Moxley M. Dalbert Batman, M.D., [facility] General and Minimally invasive Surgery Breast and Colorectal Surgery Office:   [phone] Pager:   [phone]

## 2018-04-07 ENCOUNTER — Other Ambulatory Visit: Payer: Self-pay

## 2018-04-07 ENCOUNTER — Inpatient Hospital Stay: Payer: Medicare Other

## 2018-04-07 ENCOUNTER — Ambulatory Visit (HOSPITAL_COMMUNITY)
Admission: RE | Admit: 2018-04-07 | Discharge: 2018-04-07 | Disposition: A | Discharge status: Home / Self Care | Payer: Medicare Other | Source: Ambulatory Visit | Attending: Hematology and Oncology | Admitting: Hematology and Oncology

## 2018-04-07 DIAGNOSIS — Z170 Estrogen receptor positive status [ER+]: Secondary | ICD-10-CM | POA: Insufficient documentation

## 2018-04-07 DIAGNOSIS — C50411 Malignant neoplasm of upper-outer quadrant of right female breast: Secondary | ICD-10-CM | POA: Insufficient documentation

## 2018-04-07 MED ORDER — LIDOCAINE-PRILOCAINE 2.5-2.5 % EX CREA: 1.0000 "application " | TOPICAL_CREAM | CUTANEOUS | 0 refills | Status: DC | PRN

## 2018-04-07 NOTE — Progress Notes (Signed)
  Echocardiogram 2D Echocardiogram has been performed.  Sandra Shelton 04/07/2018, 9:28 AM

## 2018-04-08 DIAGNOSIS — Z170 Estrogen receptor positive status [ER+]: Secondary | ICD-10-CM | POA: Diagnosis not present

## 2018-04-08 DIAGNOSIS — C50411 Malignant neoplasm of upper-outer quadrant of right female breast: Secondary | ICD-10-CM | POA: Diagnosis not present

## 2018-04-08 DIAGNOSIS — I10 Essential (primary) hypertension: Secondary | ICD-10-CM | POA: Diagnosis not present

## 2018-04-08 DIAGNOSIS — Z794 Long term (current) use of insulin: Secondary | ICD-10-CM | POA: Diagnosis not present

## 2018-04-08 DIAGNOSIS — E119 Type 2 diabetes mellitus without complications: Secondary | ICD-10-CM | POA: Diagnosis not present

## 2018-04-08 DIAGNOSIS — Z483 Aftercare following surgery for neoplasm: Secondary | ICD-10-CM | POA: Diagnosis not present

## 2018-04-08 MED FILL — LIDOCAINE-PRILOCAINE CREAM: 2.5-2.5 | 15 days supply | Qty: 30 | Fill #0

## 2018-04-09 ENCOUNTER — Ambulatory Visit (HOSPITAL_BASED_OUTPATIENT_CLINIC_OR_DEPARTMENT_OTHER)
Admission: RE | Admit: 2018-04-09 | Discharge: 2018-04-09 | Disposition: A | Discharge status: Home / Self Care | Payer: Medicare Other | Attending: General Surgery | Admitting: General Surgery

## 2018-04-09 ENCOUNTER — Ambulatory Visit (HOSPITAL_COMMUNITY): Payer: Medicare Other

## 2018-04-09 ENCOUNTER — Encounter (HOSPITAL_BASED_OUTPATIENT_CLINIC_OR_DEPARTMENT_OTHER): Payer: Self-pay | Admitting: *Deleted

## 2018-04-09 ENCOUNTER — Ambulatory Visit (HOSPITAL_BASED_OUTPATIENT_CLINIC_OR_DEPARTMENT_OTHER): Payer: Medicare Other | Admitting: Certified Registered"

## 2018-04-09 ENCOUNTER — Other Ambulatory Visit: Payer: Self-pay

## 2018-04-09 ENCOUNTER — Encounter (HOSPITAL_BASED_OUTPATIENT_CLINIC_OR_DEPARTMENT_OTHER)
Admission: RE | Disposition: A | Discharge status: Home / Self Care | Payer: Self-pay | Source: Home / Self Care | Attending: General Surgery

## 2018-04-09 DIAGNOSIS — Z7982 Long term (current) use of aspirin: Secondary | ICD-10-CM | POA: Insufficient documentation

## 2018-04-09 DIAGNOSIS — Z170 Estrogen receptor positive status [ER+]: Secondary | ICD-10-CM | POA: Insufficient documentation

## 2018-04-09 DIAGNOSIS — Z7902 Long term (current) use of antithrombotics/antiplatelets: Secondary | ICD-10-CM | POA: Insufficient documentation

## 2018-04-09 DIAGNOSIS — Z8673 Personal history of transient ischemic attack (TIA), and cerebral infarction without residual deficits: Secondary | ICD-10-CM | POA: Insufficient documentation

## 2018-04-09 DIAGNOSIS — Z794 Long term (current) use of insulin: Secondary | ICD-10-CM | POA: Diagnosis not present

## 2018-04-09 DIAGNOSIS — Z803 Family history of malignant neoplasm of breast: Secondary | ICD-10-CM | POA: Insufficient documentation

## 2018-04-09 DIAGNOSIS — Z95828 Presence of other vascular implants and grafts: Secondary | ICD-10-CM

## 2018-04-09 DIAGNOSIS — E1151 Type 2 diabetes mellitus with diabetic peripheral angiopathy without gangrene: Secondary | ICD-10-CM | POA: Diagnosis not present

## 2018-04-09 DIAGNOSIS — I10 Essential (primary) hypertension: Secondary | ICD-10-CM | POA: Insufficient documentation

## 2018-04-09 DIAGNOSIS — Z452 Encounter for adjustment and management of vascular access device: Secondary | ICD-10-CM | POA: Diagnosis not present

## 2018-04-09 DIAGNOSIS — Z4682 Encounter for fitting and adjustment of non-vascular catheter: Secondary | ICD-10-CM | POA: Diagnosis not present

## 2018-04-09 DIAGNOSIS — C50411 Malignant neoplasm of upper-outer quadrant of right female breast: Secondary | ICD-10-CM | POA: Insufficient documentation

## 2018-04-09 DIAGNOSIS — C50111 Malignant neoplasm of central portion of right female breast: Secondary | ICD-10-CM | POA: Diagnosis not present

## 2018-04-09 HISTORY — PX: PORTACATH PLACEMENT: SHX2246

## 2018-04-09 LAB — GLUCOSE, CAPILLARY
Glucose-Capillary: 86 mg/dL (ref 70–99)
Glucose-Capillary: 103 mg/dL — ABNORMAL HIGH (ref 70–99)

## 2018-04-09 SURGERY — INSERTION, TUNNELED CENTRAL VENOUS DEVICE, WITH PORT
Anesthesia: General | Site: Chest

## 2018-04-09 MED ORDER — DEXAMETHASONE SODIUM PHOSPHATE 4 MG/ML IJ SOLN
INTRAMUSCULAR | Status: DC | PRN
  Administered 2018-04-09: 4 mg via INTRAVENOUS

## 2018-04-09 MED ORDER — ACETAMINOPHEN 325 MG PO TABS: 650.0000 mg | ORAL_TABLET | ORAL | Status: DC | PRN

## 2018-04-09 MED ORDER — CEFAZOLIN SODIUM-DEXTROSE 2-4 GM/100ML-% IV SOLN
INTRAVENOUS | Status: AC
  Filled 2018-04-09: qty 100

## 2018-04-09 MED ORDER — HEPARIN (PORCINE) IN NACL 2-0.9 UNITS/ML
INTRAMUSCULAR | Status: AC | PRN
  Administered 2018-04-09: 1

## 2018-04-09 MED ORDER — BUPIVACAINE-EPINEPHRINE (PF) 0.5% -1:200000 IJ SOLN
INTRAMUSCULAR | Status: DC | PRN
  Administered 2018-04-09: 10 mL

## 2018-04-09 MED ORDER — PROMETHAZINE HCL 25 MG/ML IJ SOLN: 6.2500 mg | INTRAMUSCULAR | Status: DC | PRN

## 2018-04-09 MED ORDER — ONDANSETRON HCL 4 MG/2ML IJ SOLN
INTRAMUSCULAR | Status: DC | PRN
  Administered 2018-04-09: 4 mg via INTRAVENOUS

## 2018-04-09 MED ORDER — GABAPENTIN 300 MG PO CAPS
300.0000 mg | ORAL_CAPSULE | ORAL | Status: AC
  Administered 2018-04-09: 300 mg via ORAL

## 2018-04-09 MED ORDER — OXYCODONE HCL 5 MG PO TABS: 5.0000 mg | ORAL_TABLET | Freq: Once | ORAL | Status: DC | PRN

## 2018-04-09 MED ORDER — PROPOFOL 500 MG/50ML IV EMUL
INTRAVENOUS | Status: AC
  Filled 2018-04-09: qty 100

## 2018-04-09 MED ORDER — DEXAMETHASONE SODIUM PHOSPHATE 10 MG/ML IJ SOLN
INTRAMUSCULAR | Status: AC
  Filled 2018-04-09: qty 5

## 2018-04-09 MED ORDER — MEPERIDINE HCL 25 MG/ML IJ SOLN: 6.2500 mg | INTRAMUSCULAR | Status: DC | PRN

## 2018-04-09 MED ORDER — CHLORHEXIDINE GLUCONATE CLOTH 2 % EX PADS: 6.0000 | MEDICATED_PAD | Freq: Once | CUTANEOUS | Status: DC

## 2018-04-09 MED ORDER — LACTATED RINGERS IV SOLN: INTRAVENOUS | Status: DC

## 2018-04-09 MED ORDER — HEPARIN SOD (PORK) LOCK FLUSH 100 UNIT/ML IV SOLN
INTRAVENOUS | Status: DC | PRN
  Administered 2018-04-09: 500 [IU]

## 2018-04-09 MED ORDER — EPHEDRINE 5 MG/ML INJ
INTRAVENOUS | Status: AC
  Filled 2018-04-09: qty 10

## 2018-04-09 MED ORDER — ONDANSETRON HCL 4 MG/2ML IJ SOLN
INTRAMUSCULAR | Status: AC
  Filled 2018-04-09: qty 22

## 2018-04-09 MED ORDER — FENTANYL CITRATE (PF) 100 MCG/2ML IJ SOLN
INTRAMUSCULAR | Status: AC
  Filled 2018-04-09: qty 2

## 2018-04-09 MED ORDER — FENTANYL CITRATE (PF) 100 MCG/2ML IJ SOLN: 25.0000 ug | INTRAMUSCULAR | Status: DC | PRN

## 2018-04-09 MED ORDER — CEFAZOLIN SODIUM-DEXTROSE 2-4 GM/100ML-% IV SOLN
2.0000 g | INTRAVENOUS | Status: AC
  Administered 2018-04-09: 2 g via INTRAVENOUS

## 2018-04-09 MED ORDER — SODIUM CHLORIDE 0.9% FLUSH: 3.0000 mL | INTRAVENOUS | Status: DC | PRN

## 2018-04-09 MED ORDER — ACETAMINOPHEN 650 MG RE SUPP: 650.0000 mg | RECTAL | Status: DC | PRN

## 2018-04-09 MED ORDER — ACETAMINOPHEN 500 MG PO TABS
1000.0000 mg | ORAL_TABLET | ORAL | Status: AC
  Administered 2018-04-09: 1000 mg via ORAL

## 2018-04-09 MED ORDER — PHENYLEPHRINE 40 MCG/ML (10ML) SYRINGE FOR IV PUSH (FOR BLOOD PRESSURE SUPPORT)
PREFILLED_SYRINGE | INTRAVENOUS | Status: AC
  Filled 2018-04-09: qty 10

## 2018-04-09 MED ORDER — LACTATED RINGERS IV SOLN
INTRAVENOUS | Status: DC
  Administered 2018-04-09: 09:00:00 via INTRAVENOUS

## 2018-04-09 MED ORDER — MIDAZOLAM HCL 2 MG/2ML IJ SOLN: 1.0000 mg | INTRAMUSCULAR | Status: DC | PRN

## 2018-04-09 MED ORDER — FENTANYL CITRATE (PF) 100 MCG/2ML IJ SOLN
50.0000 ug | INTRAMUSCULAR | Status: DC | PRN
  Administered 2018-04-09 (×2): 50 ug via INTRAVENOUS

## 2018-04-09 MED ORDER — HYDROMORPHONE HCL 1 MG/ML IJ SOLN: 0.2500 mg | INTRAMUSCULAR | Status: DC | PRN

## 2018-04-09 MED ORDER — HYDROCODONE-ACETAMINOPHEN 5-325 MG PO TABS: 1.0000 | ORAL_TABLET | Freq: Four times a day (QID) | ORAL | 0 refills | Status: DC | PRN

## 2018-04-09 MED ORDER — LIDOCAINE 2% (20 MG/ML) 5 ML SYRINGE
INTRAMUSCULAR | Status: AC
  Filled 2018-04-09: qty 25

## 2018-04-09 MED ORDER — OXYCODONE HCL 5 MG/5ML PO SOLN: 5.0000 mg | Freq: Once | ORAL | Status: DC | PRN

## 2018-04-09 MED ORDER — GABAPENTIN 300 MG PO CAPS
ORAL_CAPSULE | ORAL | Status: AC
  Filled 2018-04-09: qty 1

## 2018-04-09 MED ORDER — OXYCODONE HCL 5 MG PO TABS: 5.0000 mg | ORAL_TABLET | ORAL | Status: DC | PRN

## 2018-04-09 MED ORDER — ACETAMINOPHEN 500 MG PO TABS
ORAL_TABLET | ORAL | Status: AC
  Filled 2018-04-09: qty 2

## 2018-04-09 MED ORDER — SODIUM CHLORIDE 0.9% FLUSH: 3.0000 mL | Freq: Two times a day (BID) | INTRAVENOUS | Status: DC

## 2018-04-09 MED ORDER — SODIUM CHLORIDE 0.9 % IV SOLN: 250.0000 mL | INTRAVENOUS | Status: DC | PRN

## 2018-04-09 MED ORDER — PROPOFOL 10 MG/ML IV BOLUS
INTRAVENOUS | Status: DC | PRN
  Administered 2018-04-09: 150 mg via INTRAVENOUS

## 2018-04-09 MED ORDER — LIDOCAINE HCL (CARDIAC) PF 100 MG/5ML IV SOSY
PREFILLED_SYRINGE | INTRAVENOUS | Status: DC | PRN
  Administered 2018-04-09: 60 mg via INTRAVENOUS

## 2018-04-09 MED ORDER — SCOPOLAMINE 1 MG/3DAYS TD PT72: 1.0000 | MEDICATED_PATCH | Freq: Once | TRANSDERMAL | Status: DC | PRN

## 2018-04-09 SURGICAL SUPPLY — 55 items
BAG DECANTER FOR FLEXI CONT (MISCELLANEOUS) ×3 IMPLANT
BENZOIN TINCTURE PRP APPL 2/3 (GAUZE/BANDAGES/DRESSINGS) IMPLANT
BLADE HEX COATED 2.75 (ELECTRODE) ×3 IMPLANT
BLADE SURG 15 STRL LF DISP TIS (BLADE) ×1 IMPLANT
BLADE SURG 15 STRL SS (BLADE) ×2
CANISTER SUCT 1200ML W/VALVE (MISCELLANEOUS) IMPLANT
CHLORAPREP W/TINT 26 (MISCELLANEOUS) ×3 IMPLANT
CLOSURE WOUND 1/2 X4 (GAUZE/BANDAGES/DRESSINGS)
COVER BACK TABLE 60X90IN (DRAPES) ×3 IMPLANT
COVER MAYO STAND STRL (DRAPES) ×3 IMPLANT
COVER PROBE 5X48 (MISCELLANEOUS) ×2
COVER WAND RF STERILE (DRAPES) IMPLANT
DECANTER SPIKE VIAL GLASS SM (MISCELLANEOUS) IMPLANT
DERMABOND ADVANCED (GAUZE/BANDAGES/DRESSINGS) ×2
DERMABOND ADVANCED .7 DNX12 (GAUZE/BANDAGES/DRESSINGS) ×1 IMPLANT
DRAPE C-ARM 42X72 X-RAY (DRAPES) ×3 IMPLANT
DRAPE LAPAROSCOPIC ABDOMINAL (DRAPES) ×3 IMPLANT
DRAPE UTILITY XL STRL (DRAPES) ×3 IMPLANT
DRSG TEGADERM 2-3/8X2-3/4 SM (GAUZE/BANDAGES/DRESSINGS) IMPLANT
DRSG TEGADERM 4X10 (GAUZE/BANDAGES/DRESSINGS) IMPLANT
DRSG TEGADERM 4X4.75 (GAUZE/BANDAGES/DRESSINGS) IMPLANT
ELECT REM PT RETURN 9FT ADLT (ELECTROSURGICAL) ×3
ELECTRODE REM PT RTRN 9FT ADLT (ELECTROSURGICAL) ×1 IMPLANT
GAUZE SPONGE 4X4 12PLY STRL LF (GAUZE/BANDAGES/DRESSINGS) IMPLANT
GLOVE BIO SURGEON STRL SZ 6.5 (GLOVE) ×4 IMPLANT
GLOVE BIO SURGEONS STRL SZ 6.5 (GLOVE) ×2
GLOVE BIOGEL PI IND STRL 7.0 (GLOVE) ×2 IMPLANT
GLOVE BIOGEL PI INDICATOR 7.0 (GLOVE) ×4
GLOVE EUDERMIC 7 POWDERFREE (GLOVE) ×3 IMPLANT
GOWN STRL REUS W/ TWL LRG LVL3 (GOWN DISPOSABLE) ×2 IMPLANT
GOWN STRL REUS W/ TWL XL LVL3 (GOWN DISPOSABLE) ×1 IMPLANT
GOWN STRL REUS W/TWL LRG LVL3 (GOWN DISPOSABLE) ×4
GOWN STRL REUS W/TWL XL LVL3 (GOWN DISPOSABLE) ×2
IV CATH PLACEMENT UNIT 16 GA (IV SOLUTION) IMPLANT
IV KIT MINILOC 20X1 SAFETY (NEEDLE) IMPLANT
KIT CVR 48X5XPRB PLUP LF (MISCELLANEOUS) ×1 IMPLANT
KIT PORT POWER 8FR ISP CVUE (Port) ×3 IMPLANT
NEEDLE BLUNT 17GA (NEEDLE) IMPLANT
NEEDLE HYPO 22GX1.5 SAFETY (NEEDLE) ×3 IMPLANT
NEEDLE HYPO 25X1 1.5 SAFETY (NEEDLE) ×3 IMPLANT
PACK BASIN DAY SURGERY FS (CUSTOM PROCEDURE TRAY) ×3 IMPLANT
PENCIL BUTTON HOLSTER BLD 10FT (ELECTRODE) ×3 IMPLANT
SET SHEATH INTRODUCER 10FR (MISCELLANEOUS) IMPLANT
SHEATH COOK PEEL AWAY SET 9F (SHEATH) IMPLANT
SLEEVE SCD COMPRESS KNEE MED (MISCELLANEOUS) ×3 IMPLANT
STRIP CLOSURE SKIN 1/2X4 (GAUZE/BANDAGES/DRESSINGS) IMPLANT
SUT MNCRL AB 4-0 PS2 18 (SUTURE) ×3 IMPLANT
SUT PROLENE 2 0 CT2 30 (SUTURE) ×3 IMPLANT
SUT VICRYL 3-0 CR8 SH (SUTURE) ×3 IMPLANT
SYR 10ML LL (SYRINGE) ×3 IMPLANT
SYR 5ML LUER SLIP (SYRINGE) ×3 IMPLANT
TOWEL GREEN STERILE FF (TOWEL DISPOSABLE) ×6 IMPLANT
TUBE CONNECTING 20'X1/4 (TUBING)
TUBE CONNECTING 20X1/4 (TUBING) IMPLANT
YANKAUER SUCT BULB TIP NO VENT (SUCTIONS) IMPLANT

## 2018-04-09 NOTE — Anesthesia Procedure Notes (Signed)
Procedure Name: LMA Insertion Date/Time: 04/09/2018 9:35 AM Performed by: Signe Colt, CRNA Pre-anesthesia Checklist: Patient identified, Emergency Drugs available, Suction available and Patient being monitored Patient Re-evaluated:Patient Re-evaluated prior to induction Oxygen Delivery Method: Circle system utilized Preoxygenation: Pre-oxygenation with 100% oxygen Induction Type: IV induction Ventilation: Mask ventilation without difficulty LMA: LMA inserted LMA Size: 4.0 Number of attempts: 1 Airway Equipment and Method: Bite block Placement Confirmation: positive ETCO2 Tube secured with: Tape Dental Injury: Teeth and Oropharynx as per pre-operative assessment

## 2018-04-09 NOTE — Anesthesia Postprocedure Evaluation (Signed)
Anesthesia Post Note  Patient: Sandra Shelton  Procedure(s) Performed: INSERTION PORT-A-CATH WITH ULTRASOUND (N/A Chest)     Patient location during evaluation: PACU Anesthesia Type: General Level of consciousness: awake and alert Pain management: pain level controlled Vital Signs Assessment: post-procedure vital signs reviewed and stable Respiratory status: spontaneous breathing, nonlabored ventilation and respiratory function stable Cardiovascular status: blood pressure returned to baseline and stable Postop Assessment: no apparent nausea or vomiting Anesthetic complications: no    Last Vitals:  Vitals:   04/09/18 1100 04/09/18 1123  BP:  (!) 150/70  Pulse: 88 83  Resp: 13 16  Temp:  36.5 C  SpO2: 98% 100%    Last Pain:  Vitals:   04/09/18 1123  TempSrc:   PainSc: 1                  Lynda Rainwater

## 2018-04-09 NOTE — Op Note (Signed)
Patient Name:           Sandra Shelton   Date of Surgery:        04/09/2018  Pre op Diagnosis:      Invasive cancer right breast  Post op Diagnosis:    Same  Procedure:                 Insertion of PowerPort [place] 8 French tunneled venous vascular access device                                      Use of fluoroscopy for guidance and positioning  Surgeon:                     Edsel Petrin. Dalbert Batman, M.D., FACS  Assistant:                      OR staff  Operative Indications:   This is a 63 year old female who recently underwent right lumpectomy and axillary sentinel node biopsy.  This turned out to be a T1c, N0 cancer, receptor positive.  HER-2 positive.  She was advised to have adjuvant chemotherapy and is brought to the operating room for Port-A-Cath insertion.  Chemotherapy will be followed by radiation therapy. Past history significant for cerebrovascular accident 6213 felt to be embolic. She underwent trans-carotid graft on the left side by Dr. Oneida Alar. She takes Plavix and aspirin. Insulin-dependent diabetes. GERD. Hypertension. Surgery for uterine fibroids.     Plavix has been held for 5 to 6 days.  Operative Findings:       The port was placed through the right subclavian vein, hoping to avoid any interference with the left carotid stent.  The procedure was uneventful.  At the completion of the case the catheter tip appeared to be in the superior vena cava at the right atrial junction.  The catheter flushed easily and had excellent blood return.  There was no deformity of the catheter anywhere along its course.  Procedure in Detail:          Following the induction of general LMA anesthesia the patient was positioned with a small roll behind her shoulders and her arms tucked at her sides.  The neck and chest was prepped and draped in a sterile fashion.  Surgical timeout was performed.  Intravenous antibiotics were given.  0.25% Marcaine with epinephrine was used as a local infiltration  anesthetic.    A right subclavian venipuncture was performed.  I had to make 2 or 3 passes but got excellent blood return and threaded the wire without difficulty.  Fluoroscopy confirmed the wire in the inferior vena cava.  Using the C arm I drew a template on the chest wall to guide length and positioning of the catheter.  A small incision was made at the wire insertion site.  A transverse incision was made about 2-1/2 cm below the medial third of the right clavicle.  Subcutaneous pocket was created.  Using a tunneling device I passed the catheter from the wire insertion site to the port pocket site.  Using the template drawn the chest wall I cut the catheter 27 cm in length and secured the catheter to the port with the locking device.  This was followed by flushing with heparinized saline.  The port was sutured to the pectoralis fascia with 3 interrupted sutures of 2-0 Prolene.  The  dilator and peel-away sheath assembly was inserted over the guidewire into the central venous circulation.  The wire and dilator were removed.  The catheter was threaded and the peel-away sheath removed.  There was excellent blood return and flushed easily.  Fluoroscopy confirmed good positioning of the catheter without any deformity as mentioned above.  The port and catheter were flushed with concentrated heparin.     There was no bleeding.  The subcutaneous tissue was closed with 3-0 Vicryl and the skin closed with subcuticular 4-0 Monocryl and Dermabond.  The patient was taken to PACU in stable condition.  EBL 15 cc.  Counts correct.  Complications none.   Addendum: I logged onto the PMP aware website and reviewed her prescription medication history     Haywood M. Dalbert Batman, M.D., FACS General and Minimally Invasive Surgery Breast and Colorectal Surgery  04/09/2018 10:16 AM

## 2018-04-09 NOTE — Discharge Instructions (Signed)
° ° °PORT-A-CATH: POST OP INSTRUCTIONS ° °Always review your discharge instruction sheet given to you by the facility where your surgery was performed.  ° °1. A prescription for pain medication may be given to you upon discharge. Take your pain medication as prescribed, if needed. If narcotic pain medicine is not needed, then you make take acetaminophen (Tylenol) or ibuprofen (Advil) as needed.  °2. Take your usually prescribed medications unless otherwise directed. °3. If you need a refill on your pain medication, please contact our office. All narcotic pain medicine now requires a paper prescription.  Phoned in and fax refills are no longer allowed by law.  Prescriptions will not be filled after 5 pm or on weekends.  °4. You should follow a light diet for the remainder of the day after your procedure. °5. Most patients will experience some mild swelling and/or bruising in the area of the incision. It may take several days to resolve. °6. It is common to experience some constipation if taking pain medication after surgery. Increasing fluid intake and taking a stool softener (such as Colace) will usually help or prevent this problem from occurring. A mild laxative (Milk of Magnesia or Miralax) should be taken according to package directions if there are no bowel movements after 48 hours.  °7. Unless discharge instructions indicate otherwise, you may remove your bandages 48 hours after surgery, and you may shower at that time. You may have steri-strips (small white skin tapes) in place directly over the incision.  These strips should be left on the skin for 7-10 days.  If your surgeon used Dermabond (skin glue) on the incision, you may shower in 24 hours.  The glue will flake off over the next 2-3 weeks.  °8. If your port is left accessed at the end of surgery (needle left in port), the dressing cannot get wet and should only by changed by a healthcare professional. When the port is no longer accessed (when the  needle has been removed), follow step 7.   °9. ACTIVITIES:  Limit activity involving your arms for the next 72 hours. Do no strenuous exercise or activity for 1 week. You may drive when you are no longer taking prescription pain medication, you can comfortably wear a seatbelt, and you can maneuver your car. °10.You may need to see your doctor in the office for a follow-up appointment.  Please °      check with your doctor.  °11.When you receive a new Port-a-Cath, you will get a product guide and  °      ID card.  Please keep them in case you need them. ° °WHEN TO CALL YOUR DOCTOR ([phone]): °1. Fever over 101.0 °2. Chills °3. Continued bleeding from incision °4. Increased redness and tenderness at the site °5. Shortness of breath, difficulty breathing ° ° °The clinic staff is available to answer your questions during regular business hours. Please don’t hesitate to call and ask to speak to one of the nurses or medical assistants for clinical concerns. If you have a medical emergency, go to the nearest emergency room or call 911.  A surgeon from [facility] is always on call at the hospital.  ° ° ° °For further information, please visit [email or web address] ° ° ° ° ° ° ° ° ° ° °••••••••• ° ° °Managing Your Pain After Surgery Without Opioids ° ° ° °Thank you for participating in our program to help patients manage their pain after surgery without opioids. This is part   of our effort to provide you with the best care possible, without exposing you or your family to the risk that opioids pose. ° °What pain can I expect after surgery? °You can expect to have some pain after surgery. This is normal. The pain is typically worse the day after surgery, and quickly begins to get better. °Many studies have found that many patients are able to manage their pain after surgery with Over-the-Counter (OTC) medications such as Tylenol and Motrin. If you have a condition that does not allow you to take  Tylenol or Motrin, notify your surgical team. ° °How will I manage my pain? °The best strategy for controlling your pain after surgery is around the clock pain control with Tylenol (acetaminophen) and Motrin (ibuprofen or Advil). Alternating these medications with each other allows you to maximize your pain control. In addition to Tylenol and Motrin, you can use heating pads or ice packs on your incisions to help reduce your pain. ° °How will I alternate your regular strength over-the-counter pain medication? °You will take a dose of pain medication every three hours. °; Start by taking 650 mg of Tylenol (2 pills of 325 mg) °; 3 hours later take 600 mg of Motrin (3 pills of 200 mg) °; 3 hours after taking the Motrin take 650 mg of Tylenol °; 3 hours after that take 600 mg of Motrin. ° ° °- 1 - ° °See example - if your first dose of Tylenol is at 12:00 PM ° ° °12:00 PM Tylenol 650 mg (2 pills of 325 mg)  °3:00 PM Motrin 600 mg (3 pills of 200 mg)  °6:00 PM Tylenol 650 mg (2 pills of 325 mg)  °9:00 PM Motrin 600 mg (3 pills of 200 mg)  °Continue alternating every 3 hours  ° °We recommend that you follow this schedule around-the-clock for at least 3 days after surgery, or until you feel that it is no longer needed. Use the table on the last page of this handout to keep track of the medications you are taking. °Important: °Do not take more than 3000mg of Tylenol or 3200mg of Motrin in a 24-hour period. °Do not take ibuprofen/Motrin if you have a history of bleeding stomach ulcers, severe kidney disease, &/or actively taking a blood thinner ° °What if I still have pain? °If you have pain that is not controlled with the over-the-counter pain medications (Tylenol and Motrin or Advil) you might have what we call “breakthrough” pain. You will receive a prescription for a small amount of an opioid pain medication such as Oxycodone, Tramadol, or Tylenol with Codeine. Use these opioid pills in the first 24 hours after surgery  if you have breakthrough pain. Do not take more than 1 pill every 4-6 hours. ° °If you still have uncontrolled pain after using all opioid pills, don't hesitate to call our staff using the number provided. We will help make sure you are managing your pain in the best way possible, and if necessary, we can provide a prescription for additional pain medication. ° ° °Day 1   ° °Time  °Name of Medication Number of pills taken  °Amount of Acetaminophen  °Pain Level  ° °Comments  °AM PM       °AM PM       °AM PM       °AM PM       °AM PM       °AM PM       °AM   PM       °AM PM       °Total Daily amount of Acetaminophen °Do not take more than  3,000 mg per day    ° ° °Day 2   ° °Time  °Name of Medication Number of pills °taken  °Amount of Acetaminophen  °Pain Level  ° °Comments  °AM PM       °AM PM       °AM PM       °AM PM       °AM PM       °AM PM       °AM PM       °AM PM       °Total Daily amount of Acetaminophen °Do not take more than  3,000 mg per day    ° ° °Day 3   ° °Time  °Name of Medication Number of pills taken  °Amount of Acetaminophen  °Pain Level  ° °Comments  °AM PM       °AM PM       °AM PM       °AM PM       ° ° ° °AM PM       °AM PM       °AM PM       °AM PM       °Total Daily amount of Acetaminophen °Do not take more than  3,000 mg per day    ° ° °Day 4   ° °Time  °Name of Medication Number of pills taken  °Amount of Acetaminophen  °Pain Level  ° °Comments  °AM PM       °AM PM       °AM PM       °AM PM       °AM PM       °AM PM       °AM PM       °AM PM       °Total Daily amount of Acetaminophen °Do not take more than  3,000 mg per day    ° ° °Day 5   ° °Time  °Name of Medication Number °of pills taken  °Amount of Acetaminophen  °Pain Level  ° °Comments  °AM PM       °AM PM       °AM PM       °AM PM       °AM PM       °AM PM       °AM PM       °AM PM       °Total Daily amount of Acetaminophen °Do not take more than  3,000 mg per day    ° ° ° °Day 6   ° °Time  °Name of Medication Number of pills °taken    °Amount of Acetaminophen  °Pain Level  °Comments  °AM PM       °AM PM       °AM PM       °AM PM       °AM PM       °AM PM       °AM PM       °AM PM       °Total Daily amount of Acetaminophen °Do not take more than  3,000 mg per day    ° ° °Day 7   ° °Time  °Name of Medication Number of pills taken  °  Amount of Acetaminophen  °Pain Level  ° °Comments  °AM PM       °AM PM       °AM PM       °AM PM       °AM PM       °AM PM       °AM PM       °AM PM       °Total Daily amount of Acetaminophen °Do not take more than  3,000 mg per day    ° ° ° ° °For additional information about how and where to safely dispose of unused opioid °medications - https://www.[employer].org ° °Disclaimer: This document contains information and/or instructional materials adapted from [facility] for the typical patient with your condition. It does not replace medical advice from your health care provider because your experience may differ from that of the °typical patient. Talk to your health care provider if you have any questions about this °document, your condition or your treatment plan. °Adapted from [facility] ° °Post Anesthesia Home Care Instructions ° °Activity: °Get plenty of rest for the remainder of the day. A responsible individual must stay with you for 24 hours following the procedure.  °For the next 24 hours, DO NOT: °-Drive a car °-Operate machinery °-Drink alcoholic beverages °-Take any medication unless instructed by your physician °-Make any legal decisions or sign important papers. ° °Meals: °Start with liquid foods such as gelatin or soup. Progress to regular foods as tolerated. Avoid greasy, spicy, heavy foods. If nausea and/or vomiting occur, drink only clear liquids until the nausea and/or vomiting subsides. Call your physician if vomiting continues. ° °Special Instructions/Symptoms: °Your throat may feel dry or sore from the anesthesia or the breathing tube placed in your throat during surgery. If this  causes discomfort, gargle with warm salt water. The discomfort should disappear within 24 hours. ° °If you had a scopolamine patch placed behind your ear for the management of post- operative nausea and/or vomiting: ° °1. The medication in the patch is effective for 72 hours, after which it should be removed.  Wrap patch in a tissue and discard in the trash. Wash hands thoroughly with soap and water. °2. You may remove the patch earlier than 72 hours if you experience unpleasant side effects which may include dry mouth, dizziness or visual disturbances. °3. Avoid touching the patch. Wash your hands with soap and water after contact with the patch. °   ° °

## 2018-04-09 NOTE — Anesthesia Preprocedure Evaluation (Signed)
Anesthesia Evaluation  Patient identified by MRN, date of birth, ID band Patient awake    Reviewed: Allergy & Precautions, NPO status , Patient's Chart, lab work & pertinent test results  Airway Mallampati: II       Dental no notable dental hx. (+) Teeth Intact   Pulmonary neg pulmonary ROS,    Pulmonary exam normal breath sounds clear to auscultation       Cardiovascular hypertension, Pt. on medications + Peripheral Vascular Disease  Normal cardiovascular exam Rhythm:Regular Rate:Normal     Neuro/Psych CVA negative psych ROS   GI/Hepatic Neg liver ROS, GERD  ,  Endo/Other  negative endocrine ROSdiabetes, Type 2, Insulin Dependent, Oral Hypoglycemic Agents  Renal/GU negative Renal ROS  negative genitourinary   Musculoskeletal negative musculoskeletal ROS (+)   Abdominal Normal abdominal exam  (+)   Peds negative pediatric ROS (+)  Hematology negative hematology ROS (+)   Anesthesia Other Findings Summary: Right Carotid: Velocities in the right ICA are consistent with a 1-39% stenosis.                Non-hemodynamically significant plaque <50% noted in the CCA. ------------------------------------------------------------------- LV EF: 75% -   80%   Left Carotid: There is no evidence of stenosis in the left ICA.               Non-hemodynamically significant plaque noted in the CCA.  Vertebrals:  Bilateral vertebral arteries demonstrate antegrade flow. Right              vertebral artery demonstrates high resistant flow. Subclavians: Normal flow hemodynamics were seen in bilateral subclavian              arteries.  *See table(s) above for measurements and observations.      Reproductive/Obstetrics negative OB ROS                             Anesthesia Physical  Anesthesia Plan  ASA: III  Anesthesia Plan: General   Post-op Pain Management:    Induction:  Intravenous  PONV Risk Score and Plan: 3 and Ondansetron, Midazolam, Treatment may vary due to age or medical condition and Dexamethasone  Airway Management Planned: LMA  Additional Equipment:   Intra-op Plan:   Post-operative Plan: Extubation in OR  Informed Consent: I have reviewed the patients History and Physical, chart, labs and discussed the procedure including the risks, benefits and alternatives for the proposed anesthesia with the patient or authorized representative who has indicated his/her understanding and acceptance.     Dental advisory given  Plan Discussed with: CRNA  Anesthesia Plan Comments:         Anesthesia Quick Evaluation

## 2018-04-09 NOTE — Interval H&P Note (Signed)
History and Physical Interval Note:  04/09/2018 8:25 AM  Sandra Shelton  has presented today for surgery, with the diagnosis of BREAST CANCER.  The various methods of treatment have been discussed with the patient and family. After consideration of risks, benefits and other options for treatment, the patient has consented to  Procedure(s): INSERTION PORT-A-CATH WITH ULTRASOUND (N/A) as a surgical intervention.  The patient's history has been reviewed, patient examined, no change in status, stable for surgery.  I have reviewed the patient's chart and labs.  Questions were answered to the patient's satisfaction.     Adin Hector

## 2018-04-09 NOTE — Transfer of Care (Signed)
Immediate Anesthesia Transfer of Care Note  Patient: Sandra Shelton  Procedure(s) Performed: INSERTION PORT-A-CATH WITH ULTRASOUND (N/A Chest)  Patient Location: PACU  Anesthesia Type:General  Level of Consciousness: drowsy and patient cooperative  Airway & Oxygen Therapy: Patient Spontanous Breathing and Patient connected to face mask oxygen  Post-op Assessment: Report given to RN and Post -op Vital signs reviewed and stable  Post vital signs: Reviewed and stable  Last Vitals:  Vitals Value Taken Time  BP 117/55 04/09/2018 10:22 AM  Temp    Pulse 79 04/09/2018 10:24 AM  Resp 17 04/09/2018 10:24 AM  SpO2 96 % 04/09/2018 10:24 AM  Vitals shown include unvalidated device data.  Last Pain:  Vitals:   04/09/18 0812  TempSrc: Oral  PainSc: 0-No pain      Patients Stated Pain Goal: 0 (90/22/84 0698)  Complications: No apparent anesthesia complications

## 2018-04-10 ENCOUNTER — Encounter (HOSPITAL_BASED_OUTPATIENT_CLINIC_OR_DEPARTMENT_OTHER): Payer: Self-pay | Admitting: General Surgery

## 2018-04-14 ENCOUNTER — Ambulatory Visit: Payer: Medicare Other | Admitting: Physical Therapy

## 2018-04-15 DIAGNOSIS — Z794 Long term (current) use of insulin: Secondary | ICD-10-CM | POA: Diagnosis not present

## 2018-04-15 DIAGNOSIS — Z170 Estrogen receptor positive status [ER+]: Secondary | ICD-10-CM | POA: Diagnosis not present

## 2018-04-15 DIAGNOSIS — E119 Type 2 diabetes mellitus without complications: Secondary | ICD-10-CM | POA: Diagnosis not present

## 2018-04-15 DIAGNOSIS — Z483 Aftercare following surgery for neoplasm: Secondary | ICD-10-CM | POA: Diagnosis not present

## 2018-04-15 DIAGNOSIS — I10 Essential (primary) hypertension: Secondary | ICD-10-CM | POA: Diagnosis not present

## 2018-04-15 DIAGNOSIS — C50411 Malignant neoplasm of upper-outer quadrant of right female breast: Secondary | ICD-10-CM | POA: Diagnosis not present

## 2018-04-17 ENCOUNTER — Other Ambulatory Visit: Payer: Self-pay | Admitting: Hematology and Oncology

## 2018-04-17 MED ORDER — LORAZEPAM 0.5 MG PO TABS: 0.5000 mg | ORAL_TABLET | Freq: Every evening | ORAL | 0 refills | Status: DC | PRN

## 2018-04-17 MED ORDER — LIDOCAINE-PRILOCAINE 2.5-2.5 % EX CREA: TOPICAL_CREAM | CUTANEOUS | 3 refills | Status: DC

## 2018-04-17 MED ORDER — PROCHLORPERAZINE MALEATE 10 MG PO TABS: 10.0000 mg | ORAL_TABLET | Freq: Four times a day (QID) | ORAL | 1 refills | Status: DC | PRN

## 2018-04-17 MED ORDER — ONDANSETRON HCL 8 MG PO TABS: 8.0000 mg | ORAL_TABLET | Freq: Two times a day (BID) | ORAL | 1 refills | Status: DC | PRN

## 2018-04-17 NOTE — Progress Notes (Signed)
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 

## 2018-04-17 NOTE — Progress Notes (Signed)
MD requested Dex premed be changed from 20 mg IVPB to 8 mg IV push. Orders updated, including C1D1. Kennith Center, Pharm.D., CPP 04/17/2018@4 :53 PM

## 2018-04-17 NOTE — Addendum Note (Signed)
Addended by: Nicholas Lose on: 04/17/2018 08:56 AM   Modules accepted: Orders

## 2018-04-18 ENCOUNTER — Other Ambulatory Visit: Payer: Self-pay | Admitting: *Deleted

## 2018-04-18 DIAGNOSIS — Z170 Estrogen receptor positive status [ER+]: Principal | ICD-10-CM

## 2018-04-18 DIAGNOSIS — C50411 Malignant neoplasm of upper-outer quadrant of right female breast: Secondary | ICD-10-CM

## 2018-04-18 MED ORDER — LORAZEPAM 0.5 MG PO TABS: 0.5000 mg | ORAL_TABLET | Freq: Every evening | ORAL | 0 refills | Status: DC | PRN

## 2018-04-22 ENCOUNTER — Inpatient Hospital Stay: Payer: Medicare Other | Attending: Hematology and Oncology

## 2018-04-22 ENCOUNTER — Telehealth: Payer: Self-pay | Admitting: *Deleted

## 2018-04-22 ENCOUNTER — Other Ambulatory Visit: Payer: Self-pay

## 2018-04-22 ENCOUNTER — Inpatient Hospital Stay: Payer: Medicare Other

## 2018-04-22 VITALS — BP 146/70 | HR 79 | Temp 98.4°F | Resp 18

## 2018-04-22 DIAGNOSIS — Z5111 Encounter for antineoplastic chemotherapy: Secondary | ICD-10-CM | POA: Insufficient documentation

## 2018-04-22 DIAGNOSIS — Z95828 Presence of other vascular implants and grafts: Secondary | ICD-10-CM

## 2018-04-22 DIAGNOSIS — Z5112 Encounter for antineoplastic immunotherapy: Secondary | ICD-10-CM | POA: Diagnosis not present

## 2018-04-22 DIAGNOSIS — Z7982 Long term (current) use of aspirin: Secondary | ICD-10-CM | POA: Insufficient documentation

## 2018-04-22 DIAGNOSIS — Z170 Estrogen receptor positive status [ER+]: Secondary | ICD-10-CM | POA: Insufficient documentation

## 2018-04-22 DIAGNOSIS — Z794 Long term (current) use of insulin: Secondary | ICD-10-CM | POA: Diagnosis not present

## 2018-04-22 DIAGNOSIS — Z79899 Other long term (current) drug therapy: Secondary | ICD-10-CM | POA: Diagnosis not present

## 2018-04-22 DIAGNOSIS — C50411 Malignant neoplasm of upper-outer quadrant of right female breast: Secondary | ICD-10-CM | POA: Diagnosis not present

## 2018-04-22 LAB — CBC WITH DIFFERENTIAL (CANCER CENTER ONLY)
Abs Immature Granulocytes: 0.02 10*3/uL (ref 0.00–0.07)
Basophils Absolute: 0 10*3/uL (ref 0.0–0.1)
Basophils Relative: 0 %
Eosinophils Absolute: 0.2 10*3/uL (ref 0.0–0.5)
Eosinophils Relative: 2 %
HCT: 37.3 % (ref 36.0–46.0)
Hemoglobin: 12.3 g/dL (ref 12.0–15.0)
Immature Granulocytes: 0 %
Lymphocytes Relative: 31 %
Lymphs Abs: 2.5 10*3/uL (ref 0.7–4.0)
MCH: 28.3 pg (ref 26.0–34.0)
MCHC: 33 g/dL (ref 30.0–36.0)
MCV: 85.7 fL (ref 80.0–100.0)
Monocytes Absolute: 0.7 10*3/uL (ref 0.1–1.0)
Monocytes Relative: 9 %
Neutro Abs: 4.6 10*3/uL (ref 1.7–7.7)
Neutrophils Relative %: 58 %
Platelet Count: 279 10*3/uL (ref 150–400)
RBC: 4.35 MIL/uL (ref 3.87–5.11)
RDW: 12.4 % (ref 11.5–15.5)
WBC Count: 8 10*3/uL (ref 4.0–10.5)
nRBC: 0 % (ref 0.0–0.2)

## 2018-04-22 LAB — CMP (CANCER CENTER ONLY)
ALT: 26 U/L (ref 0–44)
AST: 21 U/L (ref 15–41)
Albumin: 3.8 g/dL (ref 3.5–5.0)
Alkaline Phosphatase: 56 U/L (ref 38–126)
Anion gap: 10 (ref 5–15)
BUN: 15 mg/dL (ref 8–23)
CO2: 24 mmol/L (ref 22–32)
Calcium: 10.1 mg/dL (ref 8.9–10.3)
Chloride: 107 mmol/L (ref 98–111)
Creatinine: 0.85 mg/dL (ref 0.44–1.00)
GFR, Est AFR Am: >60 mL/min (ref >60)
GFR, Estimated: >60 mL/min (ref >60)
Glucose, Bld: 103 mg/dL — ABNORMAL HIGH (ref 70–99)
Potassium: 3.6 mmol/L (ref 3.5–5.1)
Sodium: 141 mmol/L (ref 135–145)
Total Bilirubin: 0.2 mg/dL — ABNORMAL LOW (ref 0.3–1.2)
Total Protein: 7.7 g/dL (ref 6.5–8.1)

## 2018-04-22 MED ORDER — DIPHENHYDRAMINE HCL 50 MG/ML IJ SOLN
INTRAMUSCULAR | Status: AC
  Filled 2018-04-22: qty 1

## 2018-04-22 MED ORDER — SODIUM CHLORIDE 0.9 % IV SOLN
Freq: Once | INTRAVENOUS | Status: AC
  Administered 2018-04-22: 12:00:00 via INTRAVENOUS
  Filled 2018-04-22: qty 250

## 2018-04-22 MED ORDER — ACETAMINOPHEN 325 MG PO TABS
ORAL_TABLET | ORAL | Status: AC
  Filled 2018-04-22: qty 2

## 2018-04-22 MED ORDER — ACETAMINOPHEN 325 MG PO TABS
650.0000 mg | ORAL_TABLET | Freq: Once | ORAL | Status: AC
  Administered 2018-04-22: 13:00:00 650 mg via ORAL

## 2018-04-22 MED ORDER — SODIUM CHLORIDE 0.9% FLUSH
10.0000 mL | INTRAVENOUS | Status: DC | PRN
  Administered 2018-04-22: 11:00:00 10 mL via INTRAVENOUS
  Filled 2018-04-22: qty 10

## 2018-04-22 MED ORDER — HEPARIN SOD (PORK) LOCK FLUSH 100 UNIT/ML IV SOLN
500.0000 [IU] | Freq: Once | INTRAVENOUS | Status: AC | PRN
  Administered 2018-04-22: 500 [IU]
  Filled 2018-04-22: qty 5

## 2018-04-22 MED ORDER — SODIUM CHLORIDE 0.9 % IV SOLN
20.0000 mg | Freq: Once | INTRAVENOUS | Status: AC
  Administered 2018-04-22: 20 mg via INTRAVENOUS
  Filled 2018-04-22: qty 2

## 2018-04-22 MED ORDER — DEXAMETHASONE SODIUM PHOSPHATE 10 MG/ML IJ SOLN
INTRAMUSCULAR | Status: AC
  Filled 2018-04-22: qty 1

## 2018-04-22 MED ORDER — TRASTUZUMAB CHEMO 150 MG IV SOLR
4.0000 mg/kg | Freq: Once | INTRAVENOUS | Status: AC
  Administered 2018-04-22: 14:00:00 336 mg via INTRAVENOUS
  Filled 2018-04-22: qty 16

## 2018-04-22 MED ORDER — SODIUM CHLORIDE 0.9 % IV SOLN
80.0000 mg/m2 | Freq: Once | INTRAVENOUS | Status: AC
  Administered 2018-04-22: 15:00:00 156 mg via INTRAVENOUS
  Filled 2018-04-22: qty 26

## 2018-04-22 MED ORDER — SODIUM CHLORIDE 0.9% FLUSH
10.0000 mL | INTRAVENOUS | Status: DC | PRN
  Administered 2018-04-22: 10 mL
  Filled 2018-04-22: qty 10

## 2018-04-22 MED ORDER — DEXAMETHASONE SODIUM PHOSPHATE 10 MG/ML IJ SOLN
8.0000 mg | Freq: Once | INTRAMUSCULAR | Status: AC
  Administered 2018-04-22: 8 mg via INTRAVENOUS

## 2018-04-22 MED ORDER — DIPHENHYDRAMINE HCL 50 MG/ML IJ SOLN
50.0000 mg | Freq: Once | INTRAMUSCULAR | Status: AC
  Administered 2018-04-22: 50 mg via INTRAVENOUS

## 2018-04-22 NOTE — Telephone Encounter (Signed)
  Oncology Nurse Navigator Documentation  Navigator Location: [facility] (04/22/18 0800)   )Navigator Encounter Type: Telephone;Other (04/22/18 0800) Telephone: Sandra Shelton Call (04/22/18 0800)                 Treatment Initiated Date: 03/24/18 (04/22/18 0800) Patient Visit Type: MedOnc (04/22/18 0800) Treatment Phase: First Chemo Tx (04/22/18 0800)                            Time Spent with Patient: 15 (04/22/18 0800)

## 2018-04-22 NOTE — Patient Instructions (Signed)
[place] [facility] Discharge Instructions for Patients Receiving Chemotherapy  Today you received the following chemotherapy agents Herceptin and Taxol  To help prevent nausea and vomiting after your treatment, we encourage you to take your nausea medication as directed.   If you develop nausea and vomiting that is not controlled by your nausea medication, call the clinic.   BELOW ARE SYMPTOMS THAT SHOULD BE REPORTED IMMEDIATELY:  *FEVER GREATER THAN 100.5 F  *CHILLS WITH OR WITHOUT FEVER  NAUSEA AND VOMITING THAT IS NOT CONTROLLED WITH YOUR NAUSEA MEDICATION  *UNUSUAL SHORTNESS OF BREATH  *UNUSUAL BRUISING OR BLEEDING  TENDERNESS IN MOUTH AND THROAT WITH OR WITHOUT PRESENCE OF ULCERS  *URINARY PROBLEMS  *BOWEL PROBLEMS  UNUSUAL RASH Items with * indicate a potential emergency and should be followed up as soon as possible.  Feel free to call the clinic should you have any questions or concerns. The clinic phone number is [phone].  Please show the [place] at check-in to the Emergency Department and triage nurse.  Trastuzumab (Herceptin) injection for infusion What is this medicine? TRASTUZUMAB (tras TOO zoo mab) is a monoclonal antibody. It is used to treat breast cancer and stomach cancer. This medicine may be used for other purposes; ask your health care provider or pharmacist if you have questions. COMMON BRAND NAME(S): Herceptin, Calla Kicks, OGIVRI What should I tell my health care provider before I take this medicine? They need to know if you have any of these conditions: -heart disease -heart failure -lung or breathing disease, like asthma -an unusual or allergic reaction to trastuzumab, benzyl alcohol, or other medications, foods, dyes, or preservatives -pregnant or trying to get pregnant -breast-feeding How should I use this medicine? This drug is given as an infusion into a vein. It is administered in a hospital or clinic by a specially  trained health care professional. Talk to your pediatrician regarding the use of this medicine in children. This medicine is not approved for use in children. Overdosage: If you think you have taken too much of this medicine contact a poison control center or emergency room at once. NOTE: This medicine is only for you. Do not share this medicine with others. What if I miss a dose? It is important not to miss a dose. Call your doctor or health care professional if you are unable to keep an appointment. What may interact with this medicine? This medicine may interact with the following medications: -certain types of chemotherapy, such as daunorubicin, doxorubicin, epirubicin, and idarubicin This list may not describe all possible interactions. Give your health care provider a list of all the medicines, herbs, non-prescription drugs, or dietary supplements you use. Also tell them if you smoke, drink alcohol, or use illegal drugs. Some items may interact with your medicine. What should I watch for while using this medicine? Visit your doctor for checks on your progress. Report any side effects. Continue your course of treatment even though you feel ill unless your doctor tells you to stop. Call your doctor or health care professional for advice if you get a fever, chills or sore throat, or other symptoms of a cold or flu. Do not treat yourself. Try to avoid being around people who are sick. You may experience fever, chills and shaking during your first infusion. These effects are usually mild and can be treated with other medicines. Report any side effects during the infusion to your health care professional. Fever and chills usually do not happen with later infusions.  Do not become pregnant while taking this medicine or for 7 months after stopping it. Women should inform their doctor if they wish to become pregnant or think they might be pregnant. Women of child-bearing potential will need to have a  negative pregnancy test before starting this medicine. There is a potential for serious side effects to an unborn child. Talk to your health care professional or pharmacist for more information. Do not breast-feed an infant while taking this medicine or for 7 months after stopping it. Women must use effective birth control with this medicine. What side effects may I notice from receiving this medicine? Side effects that you should report to your doctor or health care professional as soon as possible: -allergic reactions like skin rash, itching or hives, swelling of the face, lips, or tongue -chest pain or palpitations -cough -dizziness -feeling faint or lightheaded, falls -fever -general ill feeling or flu-like symptoms -signs of worsening heart failure like breathing problems; swelling in your legs and feet -unusually weak or tired Side effects that usually do not require medical attention (report to your doctor or health care professional if they continue or are bothersome): -bone pain -changes in taste -diarrhea -joint pain -nausea/vomiting -weight loss This list may not describe all possible side effects. Call your doctor for medical advice about side effects. You may report side effects to FDA at 1-800-FDA-1088. Where should I keep my medicine? This drug is given in a hospital or clinic and will not be stored at home. NOTE: This sheet is a summary. It may not cover all possible information. If you have questions about this medicine, talk to your doctor, pharmacist, or health care provider.  2019 Elsevier/Gold Standard (2015-12-27 14:37:52)  Paclitaxel (Taxol)  injection What is this medicine? PACLITAXEL (PAK li TAX el) is a chemotherapy drug. It targets fast dividing cells, like cancer cells, and causes these cells to die. This medicine is used to treat ovarian cancer, breast cancer, lung cancer, Kaposi's sarcoma, and other cancers. This medicine may be used for other purposes; ask  your health care provider or pharmacist if you have questions. COMMON BRAND NAME(S): Onxol, Taxol What should I tell my health care provider before I take this medicine? They need to know if you have any of these conditions: -history of irregular heartbeat -liver disease -low blood counts, like low white cell, platelet, or red cell counts -lung or breathing disease, like asthma -tingling of the fingers or toes, or other nerve disorder -an unusual or allergic reaction to paclitaxel, alcohol, polyoxyethylated castor oil, other chemotherapy, other medicines, foods, dyes, or preservatives -pregnant or trying to get pregnant -breast-feeding How should I use this medicine? This drug is given as an infusion into a vein. It is administered in a hospital or clinic by a specially trained health care professional. Talk to your pediatrician regarding the use of this medicine in children. Special care may be needed. Overdosage: If you think you have taken too much of this medicine contact a poison control center or emergency room at once. NOTE: This medicine is only for you. Do not share this medicine with others. What if I miss a dose? It is important not to miss your dose. Call your doctor or health care professional if you are unable to keep an appointment. What may interact with this medicine? Do not take this medicine with any of the following medications: -disulfiram -metronidazole This medicine may also interact with the following medications: -antiviral medicines for hepatitis, HIV or AIDS -certain  antibiotics like erythromycin and clarithromycin -certain medicines for fungal infections like ketoconazole and itraconazole -certain medicines for seizures like carbamazepine, phenobarbital, phenytoin -gemfibrozil -nefazodone -rifampin -St. John's wort This list may not describe all possible interactions. Give your health care provider a list of all the medicines, herbs, non-prescription drugs,  or dietary supplements you use. Also tell them if you smoke, drink alcohol, or use illegal drugs. Some items may interact with your medicine. What should I watch for while using this medicine? Your condition will be monitored carefully while you are receiving this medicine. You will need important blood work done while you are taking this medicine. This medicine can cause serious allergic reactions. To reduce your risk you will need to take other medicine(s) before treatment with this medicine. If you experience allergic reactions like skin rash, itching or hives, swelling of the face, lips, or tongue, tell your doctor or health care professional right away. In some cases, you may be given additional medicines to help with side effects. Follow all directions for their use. This drug may make you feel generally unwell. This is not uncommon, as chemotherapy can affect healthy cells as well as cancer cells. Report any side effects. Continue your course of treatment even though you feel ill unless your doctor tells you to stop. Call your doctor or health care professional for advice if you get a fever, chills or sore throat, or other symptoms of a cold or flu. Do not treat yourself. This drug decreases your body's ability to fight infections. Try to avoid being around people who are sick. This medicine may increase your risk to bruise or bleed. Call your doctor or health care professional if you notice any unusual bleeding. Be careful brushing and flossing your teeth or using a toothpick because you may get an infection or bleed more easily. If you have any dental work done, tell your dentist you are receiving this medicine. Avoid taking products that contain aspirin, acetaminophen, ibuprofen, naproxen, or ketoprofen unless instructed by your doctor. These medicines may hide a fever. Do not become pregnant while taking this medicine. Women should inform their doctor if they wish to become pregnant or think  they might be pregnant. There is a potential for serious side effects to an unborn child. Talk to your health care professional or pharmacist for more information. Do not breast-feed an infant while taking this medicine. Men are advised not to father a child while receiving this medicine. This product may contain alcohol. Ask your pharmacist or healthcare provider if this medicine contains alcohol. Be sure to tell all healthcare providers you are taking this medicine. Certain medicines, like metronidazole and disulfiram, can cause an unpleasant reaction when taken with alcohol. The reaction includes flushing, headache, nausea, vomiting, sweating, and increased thirst. The reaction can last from 30 minutes to several hours. What side effects may I notice from receiving this medicine? Side effects that you should report to your doctor or health care professional as soon as possible: -allergic reactions like skin rash, itching or hives, swelling of the face, lips, or tongue -breathing problems -changes in vision -fast, irregular heartbeat -high or low blood pressure -mouth sores -pain, tingling, numbness in the hands or feet -signs of decreased platelets or bleeding - bruising, pinpoint red spots on the skin, black, tarry stools, blood in the urine -signs of decreased red blood cells - unusually weak or tired, feeling faint or lightheaded, falls -signs of infection - fever or chills, cough, sore throat, pain or  difficulty passing urine -signs and symptoms of liver injury like dark yellow or brown urine; general ill feeling or flu-like symptoms; light-colored stools; loss of appetite; nausea; right upper belly pain; unusually weak or tired; yellowing of the eyes or skin -swelling of the ankles, feet, hands -unusually slow heartbeat Side effects that usually do not require medical attention (report to your doctor or health care professional if they continue or are bothersome): -diarrhea -hair  loss -loss of appetite -muscle or joint pain -nausea, vomiting -pain, redness, or irritation at site where injected -tiredness This list may not describe all possible side effects. Call your doctor for medical advice about side effects. You may report side effects to FDA at 1-800-FDA-1088. Where should I keep my medicine? This drug is given in a hospital or clinic and will not be stored at home. NOTE: This sheet is a summary. It may not cover all possible information. If you have questions about this medicine, talk to your doctor, pharmacist, or health care provider.  2019 Elsevier/Gold Standard (2016-09-04 13:14:55)

## 2018-04-22 NOTE — Assessment & Plan Note (Addendum)
02/04/2018:Screening mammogram detected right breast mass and asymmetry in the left breast.  The left breast asymmetry resolved.  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 Adj chemo with Taxol Herceptin weekly X 12 foll by Herceptin Q 3 weeks for 1 year 2. Adjuvant radiation therapy followed by 3. Adjuvant antiestrogen therapy ------------------------------------------------------------------------------------------------------------------------ Current treatment: Taxol Herceptin week 1 Echocardiogram: 04/07/2018: EF 60 to 65% Labs reviewed Chemo consent obtained Chemo education completed Return to clinic in 1 week for toxicity check

## 2018-04-23 DIAGNOSIS — Z794 Long term (current) use of insulin: Secondary | ICD-10-CM | POA: Diagnosis not present

## 2018-04-23 DIAGNOSIS — I10 Essential (primary) hypertension: Secondary | ICD-10-CM | POA: Diagnosis not present

## 2018-04-23 DIAGNOSIS — Z170 Estrogen receptor positive status [ER+]: Secondary | ICD-10-CM | POA: Diagnosis not present

## 2018-04-23 DIAGNOSIS — E119 Type 2 diabetes mellitus without complications: Secondary | ICD-10-CM | POA: Diagnosis not present

## 2018-04-23 DIAGNOSIS — Z483 Aftercare following surgery for neoplasm: Secondary | ICD-10-CM | POA: Diagnosis not present

## 2018-04-23 DIAGNOSIS — C50411 Malignant neoplasm of upper-outer quadrant of right female breast: Secondary | ICD-10-CM | POA: Diagnosis not present

## 2018-04-28 NOTE — Progress Notes (Signed)
Patient Care Team: [name], MD as PCP - General (Internal Medicine) Fanny Skates, MD as Consulting Physician (General Surgery) Nicholas Lose, MD as Consulting Physician (Hematology and Oncology) Gery Pray, MD as Consulting Physician (Radiation Oncology)  DIAGNOSIS:    ICD-10-CM   1. Malignant neoplasm of upper-outer quadrant of right breast in female, estrogen receptor positive ([place]) C50.411    Z17.0     SUMMARY OF ONCOLOGIC HISTORY:   Malignant neoplasm of upper-outer quadrant of right breast in female, estrogen receptor positive ([place])   01/27/2018 Mammogram    Screening Mammogram  Possible right breast mass; asymmetry in left breast.    02/10/2018 Initial Diagnosis    Screening mammogram detected right breast mass and asymmetry in the left breast.  The left breast asymmetry resolved.  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%, HER-2 +3+ by IHC, T1BN0 stage Ia clinical stage    03/24/2018 Surgery    Lumpectomy and SLNB: IDC, 1.2cm, grade 2, margins negative, 4 SLN negative, T1cN0, previously ER/PR/HER-2 positive with Ki-67 of 20%    03/24/2018 Cancer Staging    Staging form: Breast, AJCC 8th Edition - Pathologic stage from 03/24/2018: Stage IA (pT1c, pN0, cM0, G2, ER+, PR+, HER2+) - Signed by Gardenia Phlegm, NP on 03/26/2018    04/22/2018 -  Chemotherapy    The patient had trastuzumab (HERCEPTIN) 336 mg in sodium chloride 0.9 % 250 mL chemo infusion, 4 mg/kg = 336 mg, Intravenous,  Once, 1 of 16 cycles Administration: 336 mg (04/22/2018) PACLitaxel (TAXOL) 156 mg in sodium chloride 0.9 % 250 mL chemo infusion (</= 54m/m2), 80 mg/m2 = 156 mg, Intravenous,  Once, 1 of 3 cycles Administration: 156 mg (04/22/2018)  for chemotherapy treatment.      CHIEF COMPLIANT: Cycle 2 Taxol and Herceptin  INTERVAL HISTORY: MKaleea Shelton a 63y.o. with  above-mentioned history of right breast cancer who underwent a lumpectomy and presents to the clinic today for cycle 2 of adjuvant chemotherapy with weekly Taxol and Herceptin. Her port was inserted by Dr. IDalbert Batmanon 04/09/18. An ECHO from 04/07/18 showed an ejection fraction in the range of 60-65%.  She tolerated cycle 1 extremely well.  She did not experience any nausea or vomiting.  Did not have neuropathy.  REVIEW OF SYSTEMS:   Constitutional: Denies fevers, chills or abnormal weight loss Eyes: Denies blurriness of vision Ears, nose, mouth, throat, and face: Denies mucositis or sore throat Respiratory: Denies cough, dyspnea or wheezes Cardiovascular: Denies palpitation, chest discomfort Gastrointestinal: Denies nausea, heartburn or change in bowel habits Skin: Denies abnormal skin rashes Lymphatics: Denies new lymphadenopathy or easy bruising Neurological: Denies numbness, tingling or new weaknesses Behavioral/Psych: Mood is stable, no new changes  Extremities: No lower extremity edema Breast: denies any pain or lumps or nodules in either breasts All other systems were reviewed with the patient and are negative.  I have reviewed the past medical history, past surgical history, social history and family history with the patient and they are unchanged from previous note.  ALLERGIES:  has No Known Allergies.  MEDICATIONS:  Current Outpatient Medications  Medication Sig Dispense Refill  . amLODipine (NORVASC) 10 MG tablet Take 1 tablet (10 mg total) by mouth daily. 30 tablet 6  . aspirin 81 MG tablet Take 1 tablet (81 mg total) by mouth daily. 100 tablet 2  . blood glucose meter kit  and supplies KIT Dispense based on patient and insurance preference. Use up to four times daily as directed. (FOR ICD-9 250.00, 250.01). 1 each 0  . clopidogrel (PLAVIX) 75 MG tablet Take 1 tablet (75 mg total) by mouth daily. 30 tablet 6  . INSULIN SYRINGE .5CC/29G 29G X 1/2" 0.5 ML MISC For insulin injection  100 each 0  . LANTUS SOLOSTAR 100 UNIT/ML Solostar Pen Inject 7 Units into the skin 2 (two) times daily. 15 mL 6  . lidocaine-prilocaine (EMLA) cream Apply 1 application topically as needed. 30 g 0  . lisinopril (PRINIVIL,ZESTRIL) 5 MG tablet Take 1 tablet (5 mg total) by mouth daily. 90 tablet 6  . metFORMIN (GLUCOPHAGE) 500 MG tablet Take 1 tablet (500 mg total) by mouth 2 (two) times daily with a meal. 60 tablet 6  . Syringe/Needle, Disp, (SYRINGE 3CC/27GX1-1/4") 27G X 1-1/4" 3 ML MISC For insulin injection 100 each 0  . topiramate (TOPAMAX) 25 MG tablet Take 1 tablet (25 mg total) by mouth every evening. 30 tablet 6   No current facility-administered medications for this visit.    Facility-Administered Medications Ordered in Other Visits  Medication Dose Route Frequency Provider Last Rate Last Dose  . 0.9 %  sodium chloride infusion   Intravenous Once Nicholas Lose, MD      . acetaminophen (TYLENOL) tablet 650 mg  650 mg Oral Once Nicholas Lose, MD      . dexamethasone (DECADRON) injection 8 mg  8 mg Intravenous Once Nicholas Lose, MD      . diphenhydrAMINE (BENADRYL) injection 50 mg  50 mg Intravenous Once Nicholas Lose, MD      . famotidine (PEPCID) 20 mg in sodium chloride 0.9 % 25 mL IVPB  20 mg Intravenous Once Nicholas Lose, MD      . heparin lock flush 100 unit/mL  500 Units Intracatheter Once PRN Nicholas Lose, MD      . PACLitaxel (TAXOL) 156 mg in sodium chloride 0.9 % 250 mL chemo infusion (</= 63m/m2)  80 mg/m2 (Treatment Plan Recorded) Intravenous Once GNicholas Lose MD      . sodium chloride flush (NS) 0.9 % injection 10 mL  10 mL Intracatheter PRN GNicholas Lose MD      . trastuzumab (HERCEPTIN) 168 mg in sodium chloride 0.9 % 250 mL chemo infusion  2 mg/kg (Treatment Plan Recorded) Intravenous Once GNicholas Lose MD        PHYSICAL EXAMINATION: ECOG PERFORMANCE STATUS: 1 - Symptomatic but completely ambulatory  Vitals:   04/29/18 1042  BP: (!) 149/67  Pulse: 77   Resp: 18  Temp: 98.3 F (36.8 C)  SpO2: 100%   Filed Weights   04/29/18 1042  Weight: 184 lb 4.8 oz (83.6 kg)    GENERAL: alert, no distress and comfortable SKIN: skin color, texture, turgor are normal, no rashes or significant lesions EYES: normal, Conjunctiva are pink and non-injected, sclera clear OROPHARYNX: no exudate, no erythema and lips, buccal mucosa, and tongue normal  NECK: supple, thyroid normal size, non-tender, without nodularity LYMPH: no palpable lymphadenopathy in the cervical, axillary or inguinal LUNGS: clear to auscultation and percussion with normal breathing effort HEART: regular rate & rhythm and no murmurs and no lower extremity edema ABDOMEN: abdomen soft, non-tender and normal bowel sounds MUSCULOSKELETAL: no cyanosis of digits and no clubbing  NEURO: alert & oriented x 3 with fluent speech, no focal motor/sensory deficits EXTREMITIES: No lower extremity edema  LABORATORY DATA:  I have reviewed the data as listed  CMP Latest Ref Rng & Units 04/29/2018 04/22/2018 03/20/2018  Glucose 70 - 99 mg/dL 115(H) 103(H) 79  BUN 8 - 23 mg/dL 18 15 10   Creatinine 0.44 - 1.00 mg/dL 0.94 0.85 0.69  Sodium 135 - 145 mmol/L 142 141 139  Potassium 3.5 - 5.1 mmol/L 4.0 3.6 4.2  Chloride 98 - 111 mmol/L 109 107 106  CO2 22 - 32 mmol/L 21(L) 24 23  Calcium 8.9 - 10.3 mg/dL 10.4(H) 10.1 9.6  Total Protein 6.5 - 8.1 g/dL 7.8 7.7 7.8  Total Bilirubin 0.3 - 1.2 mg/dL <0.2(L) 0.2(L) 0.4  Alkaline Phos 38 - 126 U/L 63 56 57  AST 15 - 41 U/L 23 21 25   ALT 0 - 44 U/L 35 26 23    Lab Results  Component Value Date   WBC 5.6 04/29/2018   HGB 12.3 04/29/2018   HCT 37.7 04/29/2018   MCV 87.3 04/29/2018   PLT 291 04/29/2018   NEUTROABS 2.9 04/29/2018    ASSESSMENT & PLAN:  Malignant neoplasm of upper-outer quadrant of right breast in female, estrogen receptor positive ([place]) 02/04/2018:Screening mammogram detected right breast mass and asymmetry in the left breast.  The left  breast asymmetry resolved.  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 Adj chemo with Taxol Herceptin weekly X 12 foll by Herceptin Q 3 weeks for 1 year started 04/22/2018 2. Adjuvant radiation therapy followed by 3. Adjuvant antiestrogen therapy ------------------------------------------------------------------------------------------------------------------------ Current treatment: Taxol Herceptin week 2 Echocardiogram: 04/07/2018: EF 60 to 65% Labs reviewed Chemo toxicities: Does not have any nausea or vomiting or any other side effects to chemotherapy. Monitoring closely.  Return to clinic every week for Taxol and every 2 weeks for follow-up with me.   No orders of the defined types were placed in this encounter.  The patient has a good understanding of the overall plan. she agrees with it. she will call with any problems that may develop before the next visit here.  Nicholas Lose, MD 04/29/2018  Julious Oka Dorshimer am acting as scribe for Dr. Nicholas Lose.  I have reviewed the above documentation for accuracy and completeness, and I agree with the above.

## 2018-04-29 ENCOUNTER — Inpatient Hospital Stay (HOSPITAL_BASED_OUTPATIENT_CLINIC_OR_DEPARTMENT_OTHER): Payer: Medicare Other | Admitting: Hematology and Oncology

## 2018-04-29 ENCOUNTER — Other Ambulatory Visit: Payer: Self-pay

## 2018-04-29 ENCOUNTER — Inpatient Hospital Stay: Payer: Medicare Other

## 2018-04-29 DIAGNOSIS — C50411 Malignant neoplasm of upper-outer quadrant of right female breast: Secondary | ICD-10-CM

## 2018-04-29 DIAGNOSIS — Z794 Long term (current) use of insulin: Secondary | ICD-10-CM | POA: Diagnosis not present

## 2018-04-29 DIAGNOSIS — Z79899 Other long term (current) drug therapy: Secondary | ICD-10-CM

## 2018-04-29 DIAGNOSIS — Z5111 Encounter for antineoplastic chemotherapy: Secondary | ICD-10-CM | POA: Diagnosis not present

## 2018-04-29 DIAGNOSIS — Z5112 Encounter for antineoplastic immunotherapy: Secondary | ICD-10-CM | POA: Diagnosis not present

## 2018-04-29 DIAGNOSIS — Z170 Estrogen receptor positive status [ER+]: Principal | ICD-10-CM

## 2018-04-29 DIAGNOSIS — Z95828 Presence of other vascular implants and grafts: Secondary | ICD-10-CM

## 2018-04-29 DIAGNOSIS — Z7982 Long term (current) use of aspirin: Secondary | ICD-10-CM | POA: Diagnosis not present

## 2018-04-29 LAB — CBC WITH DIFFERENTIAL (CANCER CENTER ONLY)
Abs Immature Granulocytes: 0.03 10*3/uL (ref 0.00–0.07)
Basophils Absolute: 0 10*3/uL (ref 0.0–0.1)
Basophils Relative: 1 %
Eosinophils Absolute: 0.1 10*3/uL (ref 0.0–0.5)
Eosinophils Relative: 2 %
HCT: 37.7 % (ref 36.0–46.0)
Hemoglobin: 12.3 g/dL (ref 12.0–15.0)
Immature Granulocytes: 1 %
Lymphocytes Relative: 37 %
Lymphs Abs: 2.1 10*3/uL (ref 0.7–4.0)
MCH: 28.5 pg (ref 26.0–34.0)
MCHC: 32.6 g/dL (ref 30.0–36.0)
MCV: 87.3 fL (ref 80.0–100.0)
Monocytes Absolute: 0.4 10*3/uL (ref 0.1–1.0)
Monocytes Relative: 7 %
Neutro Abs: 2.9 10*3/uL (ref 1.7–7.7)
Neutrophils Relative %: 52 %
Platelet Count: 291 10*3/uL (ref 150–400)
RBC: 4.32 MIL/uL (ref 3.87–5.11)
RDW: 12.4 % (ref 11.5–15.5)
WBC Count: 5.6 10*3/uL (ref 4.0–10.5)
nRBC: 0 % (ref 0.0–0.2)

## 2018-04-29 LAB — CMP (CANCER CENTER ONLY)
ALT: 35 U/L (ref 0–44)
AST: 23 U/L (ref 15–41)
Albumin: 3.8 g/dL (ref 3.5–5.0)
Alkaline Phosphatase: 63 U/L (ref 38–126)
Anion gap: 12 (ref 5–15)
BUN: 18 mg/dL (ref 8–23)
CO2: 21 mmol/L — ABNORMAL LOW (ref 22–32)
Calcium: 10.4 mg/dL — ABNORMAL HIGH (ref 8.9–10.3)
Chloride: 109 mmol/L (ref 98–111)
Creatinine: 0.94 mg/dL (ref 0.44–1.00)
GFR, Est AFR Am: >60 mL/min (ref >60)
GFR, Estimated: >60 mL/min (ref >60)
Glucose, Bld: 115 mg/dL — ABNORMAL HIGH (ref 70–99)
Potassium: 4 mmol/L (ref 3.5–5.1)
Sodium: 142 mmol/L (ref 135–145)
Total Bilirubin: <0.2 mg/dL — ABNORMAL LOW (ref 0.3–1.2)
Total Protein: 7.8 g/dL (ref 6.5–8.1)

## 2018-04-29 MED ORDER — SODIUM CHLORIDE 0.9 % IV SOLN
80.0000 mg/m2 | Freq: Once | INTRAVENOUS | Status: AC
  Administered 2018-04-29: 156 mg via INTRAVENOUS
  Filled 2018-04-29: qty 26

## 2018-04-29 MED ORDER — SODIUM CHLORIDE 0.9% FLUSH
10.0000 mL | INTRAVENOUS | Status: DC | PRN
  Administered 2018-04-29: 10 mL via INTRAVENOUS
  Filled 2018-04-29: qty 10

## 2018-04-29 MED ORDER — DIPHENHYDRAMINE HCL 50 MG/ML IJ SOLN
INTRAMUSCULAR | Status: AC
  Filled 2018-04-29: qty 1

## 2018-04-29 MED ORDER — DEXAMETHASONE SODIUM PHOSPHATE 10 MG/ML IJ SOLN
8.0000 mg | Freq: Once | INTRAMUSCULAR | Status: AC
  Administered 2018-04-29: 8 mg via INTRAVENOUS

## 2018-04-29 MED ORDER — ACETAMINOPHEN 325 MG PO TABS
ORAL_TABLET | ORAL | Status: AC
  Filled 2018-04-29: qty 2

## 2018-04-29 MED ORDER — HEPARIN SOD (PORK) LOCK FLUSH 100 UNIT/ML IV SOLN
500.0000 [IU] | Freq: Once | INTRAVENOUS | Status: AC | PRN
  Administered 2018-04-29: 500 [IU]
  Filled 2018-04-29: qty 5

## 2018-04-29 MED ORDER — DIPHENHYDRAMINE HCL 50 MG/ML IJ SOLN
50.0000 mg | Freq: Once | INTRAMUSCULAR | Status: AC
  Administered 2018-04-29: 50 mg via INTRAVENOUS

## 2018-04-29 MED ORDER — TRASTUZUMAB CHEMO 150 MG IV SOLR
2.0000 mg/kg | Freq: Once | INTRAVENOUS | Status: AC
  Administered 2018-04-29: 168 mg via INTRAVENOUS
  Filled 2018-04-29: qty 8

## 2018-04-29 MED ORDER — SODIUM CHLORIDE 0.9% FLUSH
10.0000 mL | INTRAVENOUS | Status: DC | PRN
  Administered 2018-04-29: 10 mL
  Filled 2018-04-29: qty 10

## 2018-04-29 MED ORDER — DEXAMETHASONE SODIUM PHOSPHATE 10 MG/ML IJ SOLN
INTRAMUSCULAR | Status: AC
  Filled 2018-04-29: qty 1

## 2018-04-29 MED ORDER — SODIUM CHLORIDE 0.9 % IV SOLN
Freq: Once | INTRAVENOUS | Status: AC
  Administered 2018-04-29: 12:00:00 via INTRAVENOUS
  Filled 2018-04-29: qty 250

## 2018-04-29 MED ORDER — ACETAMINOPHEN 325 MG PO TABS
650.0000 mg | ORAL_TABLET | Freq: Once | ORAL | Status: AC
  Administered 2018-04-29: 650 mg via ORAL

## 2018-04-29 MED ORDER — SODIUM CHLORIDE 0.9 % IV SOLN
20.0000 mg | Freq: Once | INTRAVENOUS | Status: AC
  Administered 2018-04-29: 20 mg via INTRAVENOUS
  Filled 2018-04-29: qty 2

## 2018-04-29 MED ORDER — DIPHENHYDRAMINE HCL 25 MG PO CAPS
ORAL_CAPSULE | ORAL | Status: AC
  Filled 2018-04-29: qty 2

## 2018-04-29 MED FILL — metFORMIN HCL 500 MG TABS: 500 | 30 days supply | Qty: 60 | Fill #6

## 2018-04-29 MED FILL — AMLODIPINE BESYLATE 10 MG T: 10 | 30 days supply | Qty: 30 | Fill #6

## 2018-04-29 MED FILL — TOPIRAMATE 25 MG TABS: 25 | 30 days supply | Qty: 30 | Fill #3

## 2018-04-29 MED FILL — ROSUVASTATIN CALCIUM 40 MG: 40 | 30 days supply | Qty: 30 | Fill #4

## 2018-04-29 MED FILL — CLOPIDOGREL 75 MG TABLET: 75 | 30 days supply | Qty: 30 | Fill #6

## 2018-04-29 MED FILL — LISINOPRIL 5 MG TAB: 5 | 30 days supply | Qty: 30 | Fill #4

## 2018-04-29 NOTE — Patient Instructions (Signed)
Coronavirus (COVID-19) Are you at risk?  Are you at risk for the Coronavirus (COVID-19)?  To be considered HIGH RISK for Coronavirus (COVID-19), you have to meet the following criteria:  . Traveled to [place], [place], [place], [place] or [place]; or in the [place] to [place], [place], [place], or [place]; and have fever, cough, and shortness of breath within the last 2 weeks of travel OR . Been in close contact with a person diagnosed with COVID-19 within the last 2 weeks and have fever, cough, and shortness of breath . IF YOU DO NOT MEET THESE CRITERIA, YOU ARE CONSIDERED LOW RISK FOR COVID-19.  What to do if you are HIGH RISK for COVID-19?  . If you are having a medical emergency, call 911. . Seek medical care right away. Before you go to a doctor's office, urgent care or emergency department, call ahead and tell them about your recent travel, contact with someone diagnosed with COVID-19, and your symptoms. You should receive instructions from your physician's office regarding next steps of care.  . When you arrive at healthcare provider, tell the healthcare staff immediately you have returned from visiting [place], [place], [place], [place] or [place]; or traveled in the [place] to [place], [place], [place], or [place]; in the last two weeks or you have been in close contact with a person diagnosed with COVID-19 in the last 2 weeks.   . Tell the health care staff about your symptoms: fever, cough and shortness of breath. . After you have been seen by a medical provider, you will be either: o Tested for (COVID-19) and discharged home on quarantine except to seek medical care if symptoms worsen, and asked to  - Stay home and avoid contact with others until you get your results (4-5 days)  - Avoid travel on public transportation if possible (such as bus, train, or airplane) or o Sent to the Emergency Department by EMS for evaluation, COVID-19 testing, and possible  admission depending on your condition and test results.  What to do if you are LOW RISK for COVID-19?  Reduce your risk of any infection by using the same precautions used for avoiding the common cold or flu:  . Wash your hands often with soap and warm water for at least 20 seconds.  If soap and water are not readily available, use an alcohol-based hand sanitizer with at least 60% alcohol.  . If coughing or sneezing, cover your mouth and nose by coughing or sneezing into the elbow areas of your shirt or coat, into a tissue or into your sleeve (not your hands). . Avoid shaking hands with others and consider head nods or verbal greetings only. . Avoid touching your eyes, nose, or mouth with unwashed hands.  . Avoid close contact with people who are sick. . Avoid places or events with large numbers of people in one location, like concerts or sporting events. . Carefully consider travel plans you have or are making. . If you are planning any travel outside or inside the [place], visit the CDC's Travelers' Health webpage for the latest health notices. . If you have some symptoms but not all symptoms, continue to monitor at home and seek medical attention if your symptoms worsen. . If you are having a medical emergency, call 911.   ADDITIONAL HEALTHCARE OPTIONS FOR PATIENTS  [facility] / e-Visit: [email or web address]         [facility]: [phone]  Onset   Urgent Care: [facility]: [place] Discharge Instructions for Patients Receiving Chemotherapy  Today you received the following chemotherapy agents: Trastuzumab (Herceptin) and Paclitaxel (Taxol)  To help prevent nausea and vomiting after your treatment, we encourage you to take your nausea medication as directed.    If you develop nausea and vomiting that is not controlled by your nausea  medication, call the clinic.   BELOW ARE SYMPTOMS THAT SHOULD BE REPORTED IMMEDIATELY:  *FEVER GREATER THAN 100.5 F  *CHILLS WITH OR WITHOUT FEVER  NAUSEA AND VOMITING THAT IS NOT CONTROLLED WITH YOUR NAUSEA MEDICATION  *UNUSUAL SHORTNESS OF BREATH  *UNUSUAL BRUISING OR BLEEDING  TENDERNESS IN MOUTH AND THROAT WITH OR WITHOUT PRESENCE OF ULCERS  *URINARY PROBLEMS  *BOWEL PROBLEMS  UNUSUAL RASH Items with * indicate a potential emergency and should be followed up as soon as possible.  Feel free to call the clinic should you have any questions or concerns. The clinic phone number is [phone].  Please show the [place] at check-in to the Emergency Department and triage nurse.

## 2018-04-30 ENCOUNTER — Telehealth: Payer: Self-pay | Admitting: Physical Therapy

## 2018-04-30 NOTE — Telephone Encounter (Signed)
Leahanna Buser was contacted today regarding temporary reduction of Outpatient Rehabilitation Services due to concerns for community transmission of COVID-19.  Patient identity was verified.  Assessed if patient needed to be seen in person by clinician (recent fall or acute injury that requires hands on assessment and advice, change in diet order, post-surgical, special cases, etc.).   Patient did not have an acute/special need that requires in person visit. Proceeded with phone call.  Therapist advised the patient to continue to perform her HEP and assured she had no unanswered questions or concerns at this time.  We discussed the importance of 30 minutes of walking daily to better help her tolerate chemotherapy and to reduce recurrence risk. She is currently walking 10-15 minutes per her report. We discussed her shoulder ROM which she reports is near full but tight at end ROM. She was encouraged to continue her HEP given at her eval but to contact me if her mobility did not reach full ROM in 2-3 weeks. We discussed lymphedema precautions and she verbalized good understanding.  The patient was offered and declined the continuation of their plan of care by using a telehealth visit.  Patient is aware we can be reached by telephone during limited business hours in the meantime.  Annia Friendly, [place] 04/30/18 11:32 AM

## 2018-05-05 ENCOUNTER — Telehealth: Payer: Self-pay

## 2018-05-05 ENCOUNTER — Other Ambulatory Visit: Payer: Self-pay | Admitting: Neurology

## 2018-05-05 DIAGNOSIS — R0683 Snoring: Secondary | ICD-10-CM

## 2018-05-05 DIAGNOSIS — G4710 Hypersomnia, unspecified: Secondary | ICD-10-CM

## 2018-05-05 DIAGNOSIS — I10 Essential (primary) hypertension: Secondary | ICD-10-CM

## 2018-05-05 DIAGNOSIS — G4730 Sleep apnea, unspecified: Secondary | ICD-10-CM

## 2018-05-05 DIAGNOSIS — I63132 Cerebral infarction due to embolism of left carotid artery: Secondary | ICD-10-CM

## 2018-05-05 DIAGNOSIS — I63512 Cerebral infarction due to unspecified occlusion or stenosis of left middle cerebral artery: Secondary | ICD-10-CM

## 2018-05-05 DIAGNOSIS — E119 Type 2 diabetes mellitus without complications: Secondary | ICD-10-CM

## 2018-05-05 DIAGNOSIS — E785 Hyperlipidemia, unspecified: Secondary | ICD-10-CM

## 2018-05-05 DIAGNOSIS — E663 Overweight: Secondary | ICD-10-CM

## 2018-05-05 NOTE — Telephone Encounter (Signed)
Called the patient and informed her that our sleep lab is still closed. Advised that we are offering to get the patients set up on auto CPAP since unable to bring in an properly titrate. Dr. Brett Fairy recommends that pt starts auto CPAP 5-15 cm water pressure. I reviewed PAP compliance expectations with the pt. Pt is agreeable to starting a CPAP. I advised pt that an order will be sent to a DME, [facility], and [facility] will call the pt within about one week after they file with the pt's insurance. [facility] will show the pt how to use the machine, fit for masks, and troubleshoot the CPAP if needed. A follow up appt was made for insurance purposes with Dan Humphreys, NP. Pt verbalized understanding to arrive 15 minutes early and bring their CPAP. A letter with all of this information in it will be mailed to the pt as a reminder. I verified with the pt that the address we have on file is correct. Pt verbalized understanding of results. Pt had no questions at this time but was encouraged to call back if questions arise. I have sent the order to [facility] and have received confirmation that they have received the order.

## 2018-05-05 NOTE — Telephone Encounter (Signed)
Pt was scheduled for a CPAP titration study on 04/21/2018. Due to Covid-19, sleep lab is still closed. Would MD like to order autoCPAP?

## 2018-05-06 ENCOUNTER — Encounter: Payer: Self-pay | Admitting: *Deleted

## 2018-05-06 ENCOUNTER — Inpatient Hospital Stay: Payer: Medicare Other

## 2018-05-06 ENCOUNTER — Other Ambulatory Visit: Payer: Self-pay

## 2018-05-06 ENCOUNTER — Telehealth: Payer: Self-pay | Admitting: *Deleted

## 2018-05-06 VITALS — BP 155/68 | HR 84 | Temp 98.7°F | Resp 18

## 2018-05-06 DIAGNOSIS — C50411 Malignant neoplasm of upper-outer quadrant of right female breast: Secondary | ICD-10-CM

## 2018-05-06 DIAGNOSIS — Z170 Estrogen receptor positive status [ER+]: Principal | ICD-10-CM

## 2018-05-06 DIAGNOSIS — Z7982 Long term (current) use of aspirin: Secondary | ICD-10-CM | POA: Diagnosis not present

## 2018-05-06 DIAGNOSIS — Z5112 Encounter for antineoplastic immunotherapy: Secondary | ICD-10-CM | POA: Diagnosis not present

## 2018-05-06 DIAGNOSIS — Z95828 Presence of other vascular implants and grafts: Secondary | ICD-10-CM | POA: Insufficient documentation

## 2018-05-06 DIAGNOSIS — Z794 Long term (current) use of insulin: Secondary | ICD-10-CM | POA: Diagnosis not present

## 2018-05-06 DIAGNOSIS — Z5111 Encounter for antineoplastic chemotherapy: Secondary | ICD-10-CM | POA: Diagnosis not present

## 2018-05-06 LAB — CMP (CANCER CENTER ONLY)
ALT: 24 U/L (ref 0–44)
AST: 15 U/L (ref 15–41)
Albumin: 3.7 g/dL (ref 3.5–5.0)
Alkaline Phosphatase: 54 U/L (ref 38–126)
Anion gap: 10 (ref 5–15)
BUN: 18 mg/dL (ref 8–23)
CO2: 23 mmol/L (ref 22–32)
Calcium: 10.1 mg/dL (ref 8.9–10.3)
Chloride: 108 mmol/L (ref 98–111)
Creatinine: 0.78 mg/dL (ref 0.44–1.00)
GFR, Est AFR Am: >60 mL/min (ref >60)
GFR, Estimated: >60 mL/min (ref >60)
Glucose, Bld: 131 mg/dL — ABNORMAL HIGH (ref 70–99)
Potassium: 3.5 mmol/L (ref 3.5–5.1)
Sodium: 141 mmol/L (ref 135–145)
Total Bilirubin: <0.2 mg/dL — ABNORMAL LOW (ref 0.3–1.2)
Total Protein: 7.4 g/dL (ref 6.5–8.1)

## 2018-05-06 LAB — CBC WITH DIFFERENTIAL (CANCER CENTER ONLY)
Abs Immature Granulocytes: 0.02 10*3/uL (ref 0.00–0.07)
Basophils Absolute: 0 10*3/uL (ref 0.0–0.1)
Basophils Relative: 1 %
Eosinophils Absolute: 0.1 10*3/uL (ref 0.0–0.5)
Eosinophils Relative: 2 %
HCT: 36.1 % (ref 36.0–46.0)
Hemoglobin: 11.8 g/dL — ABNORMAL LOW (ref 12.0–15.0)
Immature Granulocytes: 0 %
Lymphocytes Relative: 39 %
Lymphs Abs: 2.4 10*3/uL (ref 0.7–4.0)
MCH: 28.7 pg (ref 26.0–34.0)
MCHC: 32.7 g/dL (ref 30.0–36.0)
MCV: 87.8 fL (ref 80.0–100.0)
Monocytes Absolute: 0.4 10*3/uL (ref 0.1–1.0)
Monocytes Relative: 6 %
Neutro Abs: 3.2 10*3/uL (ref 1.7–7.7)
Neutrophils Relative %: 52 %
Platelet Count: 341 10*3/uL (ref 150–400)
RBC: 4.11 MIL/uL (ref 3.87–5.11)
RDW: 12.6 % (ref 11.5–15.5)
WBC Count: 6.2 10*3/uL (ref 4.0–10.5)
nRBC: 0 % (ref 0.0–0.2)

## 2018-05-06 MED ORDER — SODIUM CHLORIDE 0.9% FLUSH
10.0000 mL | INTRAVENOUS | Status: DC | PRN
  Administered 2018-05-06: 17:00:00 10 mL
  Filled 2018-05-06: qty 10

## 2018-05-06 MED ORDER — SODIUM CHLORIDE 0.9 % IV SOLN
20.0000 mg | Freq: Once | INTRAVENOUS | Status: AC
  Administered 2018-05-06: 20 mg via INTRAVENOUS
  Filled 2018-05-06: qty 2

## 2018-05-06 MED ORDER — TRASTUZUMAB CHEMO 150 MG IV SOLR
2.0000 mg/kg | Freq: Once | INTRAVENOUS | Status: AC
  Administered 2018-05-06: 168 mg via INTRAVENOUS
  Filled 2018-05-06: qty 8

## 2018-05-06 MED ORDER — ACETAMINOPHEN 325 MG PO TABS
ORAL_TABLET | ORAL | Status: AC
  Filled 2018-05-06: qty 2

## 2018-05-06 MED ORDER — SODIUM CHLORIDE 0.9% FLUSH
10.0000 mL | Freq: Once | INTRAVENOUS | Status: AC
  Administered 2018-05-06: 10 mL
  Filled 2018-05-06: qty 10

## 2018-05-06 MED ORDER — HEPARIN SOD (PORK) LOCK FLUSH 100 UNIT/ML IV SOLN
500.0000 [IU] | Freq: Once | INTRAVENOUS | Status: AC | PRN
  Administered 2018-05-06: 500 [IU]
  Filled 2018-05-06: qty 5

## 2018-05-06 MED ORDER — DIPHENHYDRAMINE HCL 50 MG/ML IJ SOLN
50.0000 mg | Freq: Once | INTRAMUSCULAR | Status: AC
  Administered 2018-05-06: 14:00:00 50 mg via INTRAVENOUS

## 2018-05-06 MED ORDER — DEXAMETHASONE SODIUM PHOSPHATE 10 MG/ML IJ SOLN
INTRAMUSCULAR | Status: AC
  Filled 2018-05-06: qty 1

## 2018-05-06 MED ORDER — DEXAMETHASONE SODIUM PHOSPHATE 10 MG/ML IJ SOLN
8.0000 mg | Freq: Once | INTRAMUSCULAR | Status: AC
  Administered 2018-05-06: 14:00:00 8 mg via INTRAVENOUS

## 2018-05-06 MED ORDER — SODIUM CHLORIDE 0.9 % IV SOLN
Freq: Once | INTRAVENOUS | Status: AC
  Administered 2018-05-06: 14:00:00 via INTRAVENOUS
  Filled 2018-05-06: qty 250

## 2018-05-06 MED ORDER — SODIUM CHLORIDE 0.9 % IV SOLN
80.0000 mg/m2 | Freq: Once | INTRAVENOUS | Status: AC
  Administered 2018-05-06: 156 mg via INTRAVENOUS
  Filled 2018-05-06: qty 26

## 2018-05-06 MED ORDER — DIPHENHYDRAMINE HCL 50 MG/ML IJ SOLN
INTRAMUSCULAR | Status: AC
  Filled 2018-05-06: qty 1

## 2018-05-06 MED ORDER — ACETAMINOPHEN 325 MG PO TABS
650.0000 mg | ORAL_TABLET | Freq: Once | ORAL | Status: AC
  Administered 2018-05-06: 14:00:00 650 mg via ORAL

## 2018-05-06 NOTE — Telephone Encounter (Signed)
Left message to check in to see how her treatments are going.

## 2018-05-06 NOTE — Patient Instructions (Addendum)
[facility] Discharge Instructions for Patients Receiving Chemotherapy  Today you received the following chemotherapy agents Herceptin and Taxol.  To help prevent nausea and vomiting after your treatment, we encourage you to take your nausea medication as directed.  If you develop nausea and vomiting that is not controlled by your nausea medication, call the clinic.   BELOW ARE SYMPTOMS THAT SHOULD BE REPORTED IMMEDIATELY:  *FEVER GREATER THAN 100.5 F  *CHILLS WITH OR WITHOUT FEVER  NAUSEA AND VOMITING THAT IS NOT CONTROLLED WITH YOUR NAUSEA MEDICATION  *UNUSUAL SHORTNESS OF BREATH  *UNUSUAL BRUISING OR BLEEDING  TENDERNESS IN MOUTH AND THROAT WITH OR WITHOUT PRESENCE OF ULCERS  *URINARY PROBLEMS  *BOWEL PROBLEMS  UNUSUAL RASH Items with * indicate a potential emergency and should be followed up as soon as possible.  Feel free to call the clinic should you have any questions or concerns. The clinic phone number is [phone].  Please show the [place] at check-in to the Emergency Department and triage. This information is directly available on the CDC website: [email or web address].co.za.html    Source:CDC Reference to specific commercial products, manufacturers, companies, or trademarks does not constitute its endorsement or recommendation by the [place], [place], or Centers for [employer] and [employer].

## 2018-05-07 ENCOUNTER — Telehealth: Payer: Self-pay | Admitting: *Deleted

## 2018-05-07 NOTE — Telephone Encounter (Signed)
  Oncology Nurse Navigator Documentation  Navigator Location: [facility] (05/07/18 1500)   )Navigator Encounter Type: Telephone (05/07/18 1500) Telephone: Incoming Call (05/07/18 1500)                                                  Time Spent with Patient: 15 (05/07/18 1500)

## 2018-05-08 ENCOUNTER — Encounter: Payer: Self-pay | Admitting: Physical Therapy

## 2018-05-13 ENCOUNTER — Inpatient Hospital Stay: Payer: Medicare Other

## 2018-05-13 ENCOUNTER — Other Ambulatory Visit: Payer: Self-pay

## 2018-05-13 VITALS — BP 126/52 | HR 79 | Temp 99.0°F | Resp 18

## 2018-05-13 DIAGNOSIS — Z794 Long term (current) use of insulin: Secondary | ICD-10-CM | POA: Diagnosis not present

## 2018-05-13 DIAGNOSIS — Z5112 Encounter for antineoplastic immunotherapy: Secondary | ICD-10-CM | POA: Diagnosis not present

## 2018-05-13 DIAGNOSIS — Z7982 Long term (current) use of aspirin: Secondary | ICD-10-CM | POA: Diagnosis not present

## 2018-05-13 DIAGNOSIS — C50411 Malignant neoplasm of upper-outer quadrant of right female breast: Secondary | ICD-10-CM

## 2018-05-13 DIAGNOSIS — Z95828 Presence of other vascular implants and grafts: Secondary | ICD-10-CM

## 2018-05-13 DIAGNOSIS — Z170 Estrogen receptor positive status [ER+]: Secondary | ICD-10-CM

## 2018-05-13 DIAGNOSIS — Z5111 Encounter for antineoplastic chemotherapy: Secondary | ICD-10-CM | POA: Diagnosis not present

## 2018-05-13 LAB — CBC WITH DIFFERENTIAL (CANCER CENTER ONLY)
Abs Immature Granulocytes: 0.03 10*3/uL (ref 0.00–0.07)
Basophils Absolute: 0 10*3/uL (ref 0.0–0.1)
Basophils Relative: 1 %
Eosinophils Absolute: 0.1 10*3/uL (ref 0.0–0.5)
Eosinophils Relative: 1 %
HCT: 35.2 % — ABNORMAL LOW (ref 36.0–46.0)
Hemoglobin: 11.6 g/dL — ABNORMAL LOW (ref 12.0–15.0)
Immature Granulocytes: 1 %
Lymphocytes Relative: 40 %
Lymphs Abs: 2.1 10*3/uL (ref 0.7–4.0)
MCH: 28.5 pg (ref 26.0–34.0)
MCHC: 33 g/dL (ref 30.0–36.0)
MCV: 86.5 fL (ref 80.0–100.0)
Monocytes Absolute: 0.4 10*3/uL (ref 0.1–1.0)
Monocytes Relative: 7 %
Neutro Abs: 2.7 10*3/uL (ref 1.7–7.7)
Neutrophils Relative %: 50 %
Platelet Count: 357 10*3/uL (ref 150–400)
RBC: 4.07 MIL/uL (ref 3.87–5.11)
RDW: 13 % (ref 11.5–15.5)
WBC Count: 5.3 10*3/uL (ref 4.0–10.5)
nRBC: 0 % (ref 0.0–0.2)

## 2018-05-13 LAB — CMP (CANCER CENTER ONLY)
ALT: 16 U/L (ref 0–44)
AST: 13 U/L — ABNORMAL LOW (ref 15–41)
Albumin: 3.7 g/dL (ref 3.5–5.0)
Alkaline Phosphatase: 53 U/L (ref 38–126)
Anion gap: 8 (ref 5–15)
BUN: 15 mg/dL (ref 8–23)
CO2: 23 mmol/L (ref 22–32)
Calcium: 9.4 mg/dL (ref 8.9–10.3)
Chloride: 109 mmol/L (ref 98–111)
Creatinine: 0.79 mg/dL (ref 0.44–1.00)
GFR, Est AFR Am: >60 mL/min (ref >60)
GFR, Estimated: >60 mL/min (ref >60)
Glucose, Bld: 109 mg/dL — ABNORMAL HIGH (ref 70–99)
Potassium: 3.6 mmol/L (ref 3.5–5.1)
Sodium: 140 mmol/L (ref 135–145)
Total Bilirubin: <0.2 mg/dL — ABNORMAL LOW (ref 0.3–1.2)
Total Protein: 7.4 g/dL (ref 6.5–8.1)

## 2018-05-13 MED ORDER — FAMOTIDINE IN NACL 20-0.9 MG/50ML-% IV SOLN
INTRAVENOUS | Status: AC
  Filled 2018-05-13: qty 50

## 2018-05-13 MED ORDER — DIPHENHYDRAMINE HCL 50 MG/ML IJ SOLN
INTRAMUSCULAR | Status: AC
  Filled 2018-05-13: qty 1

## 2018-05-13 MED ORDER — TRASTUZUMAB CHEMO 150 MG IV SOLR
2.0000 mg/kg | Freq: Once | INTRAVENOUS | Status: AC
  Administered 2018-05-13: 168 mg via INTRAVENOUS
  Filled 2018-05-13: qty 8

## 2018-05-13 MED ORDER — SODIUM CHLORIDE 0.9% FLUSH
10.0000 mL | Freq: Once | INTRAVENOUS | Status: AC
  Administered 2018-05-13: 12:00:00 10 mL
  Filled 2018-05-13: qty 10

## 2018-05-13 MED ORDER — HEPARIN SOD (PORK) LOCK FLUSH 100 UNIT/ML IV SOLN
500.0000 [IU] | Freq: Once | INTRAVENOUS | Status: AC | PRN
  Administered 2018-05-13: 16:00:00 500 [IU]
  Filled 2018-05-13: qty 5

## 2018-05-13 MED ORDER — DEXAMETHASONE SODIUM PHOSPHATE 10 MG/ML IJ SOLN
INTRAMUSCULAR | Status: AC
  Filled 2018-05-13: qty 1

## 2018-05-13 MED ORDER — SODIUM CHLORIDE 0.9 % IV SOLN
80.0000 mg/m2 | Freq: Once | INTRAVENOUS | Status: AC
  Administered 2018-05-13: 15:00:00 156 mg via INTRAVENOUS
  Filled 2018-05-13: qty 26

## 2018-05-13 MED ORDER — DIPHENHYDRAMINE HCL 50 MG/ML IJ SOLN
50.0000 mg | Freq: Once | INTRAMUSCULAR | Status: AC
  Administered 2018-05-13: 14:00:00 50 mg via INTRAVENOUS

## 2018-05-13 MED ORDER — DEXAMETHASONE SODIUM PHOSPHATE 10 MG/ML IJ SOLN
8.0000 mg | Freq: Once | INTRAMUSCULAR | Status: AC
  Administered 2018-05-13: 8 mg via INTRAVENOUS

## 2018-05-13 MED ORDER — ACETAMINOPHEN 325 MG PO TABS
ORAL_TABLET | ORAL | Status: AC
  Filled 2018-05-13: qty 2

## 2018-05-13 MED ORDER — FAMOTIDINE IN NACL 20-0.9 MG/50ML-% IV SOLN
20.0000 mg | Freq: Once | INTRAVENOUS | Status: AC
  Administered 2018-05-13: 20 mg via INTRAVENOUS

## 2018-05-13 MED ORDER — SODIUM CHLORIDE 0.9 % IV SOLN
Freq: Once | INTRAVENOUS | Status: AC
  Administered 2018-05-13: 14:00:00 via INTRAVENOUS
  Filled 2018-05-13: qty 250

## 2018-05-13 MED ORDER — ACETAMINOPHEN 325 MG PO TABS
650.0000 mg | ORAL_TABLET | Freq: Once | ORAL | Status: AC
  Administered 2018-05-13: 650 mg via ORAL

## 2018-05-13 MED ORDER — SODIUM CHLORIDE 0.9% FLUSH
10.0000 mL | INTRAVENOUS | Status: DC | PRN
  Administered 2018-05-13: 10 mL
  Filled 2018-05-13: qty 10

## 2018-05-13 MED ORDER — SODIUM CHLORIDE 0.9 % IV SOLN: 20.0000 mg | Freq: Once | INTRAVENOUS | Status: DC

## 2018-05-13 NOTE — Patient Instructions (Signed)
[facility] Discharge Instructions for Patients Receiving Chemotherapy  Today you received the following chemotherapy agents :  Herceptin,  Taxol.  To help prevent nausea and vomiting after your treatment, we encourage you to take your nausea medication as prescribed.   If you develop nausea and vomiting that is not controlled by your nausea medication, call the clinic.   BELOW ARE SYMPTOMS THAT SHOULD BE REPORTED IMMEDIATELY:  *FEVER GREATER THAN 100.5 F  *CHILLS WITH OR WITHOUT FEVER  NAUSEA AND VOMITING THAT IS NOT CONTROLLED WITH YOUR NAUSEA MEDICATION  *UNUSUAL SHORTNESS OF BREATH  *UNUSUAL BRUISING OR BLEEDING  TENDERNESS IN MOUTH AND THROAT WITH OR WITHOUT PRESENCE OF ULCERS  *URINARY PROBLEMS  *BOWEL PROBLEMS  UNUSUAL RASH Items with * indicate a potential emergency and should be followed up as soon as possible.  Feel free to call the clinic should you have any questions or concerns. The clinic phone number is [phone].  Please show the CHEMO ALERT CARD at check-in to the Emergency Department and triage nurse.   

## 2018-05-13 NOTE — Patient Instructions (Signed)

## 2018-05-15 ENCOUNTER — Telehealth: Payer: Self-pay | Admitting: Adult Health

## 2018-05-15 NOTE — Telephone Encounter (Signed)
Due to current COVID 19 pandemic, our office is severely reducing in office visits for at least the next 2 weeks, in order to minimize the risk to our patients and healthcare providers. Pt understands that although there may be some limitations with this type of visit, we will take all precautions to reduce any security or privacy concerns.  Pt understands that this will be treated like an in office visit and we will file with pt's insurance, and there may be a patient responsible charge related to this service. Email sent to [email or web address]

## 2018-05-20 ENCOUNTER — Encounter: Payer: Self-pay | Admitting: Adult Health

## 2018-05-20 NOTE — Progress Notes (Signed)
Patient Care Team: [name], MD as PCP - General (Internal Medicine) Fanny Skates, MD as Consulting Physician (General Surgery) Nicholas Lose, MD as Consulting Physician (Hematology and Oncology) Gery Pray, MD as Consulting Physician (Radiation Oncology)  DIAGNOSIS:    ICD-10-CM   1. Malignant neoplasm of upper-outer quadrant of right breast in female, estrogen receptor positive ([place]) C50.411    Z17.0     SUMMARY OF ONCOLOGIC HISTORY:   Malignant neoplasm of upper-outer quadrant of right breast in female, estrogen receptor positive ([place])   01/27/2018 Mammogram    Screening Mammogram  Possible right breast mass; asymmetry in left breast.    02/10/2018 Initial Diagnosis    Screening mammogram detected right breast mass and asymmetry in the left breast.  The left breast asymmetry resolved.  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%, HER-2 +3+ by IHC, T1BN0 stage Ia clinical stage    03/24/2018 Surgery    Lumpectomy and SLNB: IDC, 1.2cm, grade 2, margins negative, 4 SLN negative, T1cN0, previously ER/PR/HER-2 positive with Ki-67 of 20%    03/24/2018 Cancer Staging    Staging form: Breast, AJCC 8th Edition - Pathologic stage from 03/24/2018: Stage IA (pT1c, pN0, cM0, G2, ER+, PR+, HER2+) - Signed by Gardenia Phlegm, NP on 03/26/2018    04/22/2018 -  Chemotherapy    The patient had trastuzumab (HERCEPTIN) 336 mg in sodium chloride 0.9 % 250 mL chemo infusion, 4 mg/kg = 336 mg, Intravenous,  Once, 2 of 16 cycles Administration: 336 mg (04/22/2018), 168 mg (04/29/2018), 168 mg (05/06/2018), 168 mg (05/13/2018) PACLitaxel (TAXOL) 156 mg in sodium chloride 0.9 % 250 mL chemo infusion (</= 53m/m2), 80 mg/m2 = 156 mg, Intravenous,  Once, 2 of 3 cycles Administration: 156 mg (04/22/2018), 156 mg (04/29/2018), 156 mg (05/06/2018), 156 mg (05/13/2018)  for chemotherapy  treatment.      CHIEF COMPLIANT: Cycle 5 Taxol and Herceptin  INTERVAL HISTORY: Sandra Shelton a 63y.o. with above-mentioned history of right breast cancer who underwent a lumpectomy and presents to the clinic today for cycle 5 of adjuvant chemotherapy with weekly Taxol and Herceptin.  REVIEW OF SYSTEMS:   Constitutional: Denies fevers, chills or abnormal weight loss Eyes: Denies blurriness of vision Ears, nose, mouth, throat, and face: Denies mucositis or sore throat Respiratory: Denies cough, dyspnea or wheezes Cardiovascular: Denies palpitation, chest discomfort Gastrointestinal: Denies nausea, heartburn or change in bowel habits Skin: Denies abnormal skin rashes Lymphatics: Denies new lymphadenopathy or easy bruising Neurological: Denies numbness, tingling or new weaknesses Behavioral/Psych: Mood is stable, no new changes  Extremities: No lower extremity edema Breast: denies any pain or lumps or nodules in either breasts All other systems were reviewed with the patient and are negative.  I have reviewed the past medical history, past surgical history, social history and family history with the patient and they are unchanged from previous note.  ALLERGIES:  has No Known Allergies.  MEDICATIONS:  Current Outpatient Medications  Medication Sig Dispense Refill  . amLODipine (NORVASC) 10 MG tablet Take 1 tablet (10 mg total) by mouth daily. 30 tablet 6  . aspirin 81 MG tablet Take 1 tablet (81 mg total) by mouth daily. 100 tablet 2  . blood glucose meter kit and supplies KIT Dispense based on patient and insurance preference. Use up to four times daily as directed. (FOR ICD-9 250.00, 250.01). 1 each 0  .  clopidogrel (PLAVIX) 75 MG tablet Take 1 tablet (75 mg total) by mouth daily. 30 tablet 6  . INSULIN SYRINGE .5CC/29G 29G X 1/2" 0.5 ML MISC For insulin injection 100 each 0  . LANTUS SOLOSTAR 100 UNIT/ML Solostar Pen Inject 7 Units into the skin 2 (two) times daily. 15 mL 6  .  lidocaine-prilocaine (EMLA) cream Apply 1 application topically as needed. 30 g 0  . lisinopril (PRINIVIL,ZESTRIL) 5 MG tablet Take 1 tablet (5 mg total) by mouth daily. 90 tablet 6  . metFORMIN (GLUCOPHAGE) 500 MG tablet Take 1 tablet (500 mg total) by mouth 2 (two) times daily with a meal. 60 tablet 6  . rosuvastatin (CRESTOR) 40 MG tablet Take 40 mg by mouth daily.    . Syringe/Needle, Disp, (SYRINGE 3CC/27GX1-1/4") 27G X 1-1/4" 3 ML MISC For insulin injection 100 each 0   No current facility-administered medications for this visit.    Facility-Administered Medications Ordered in Other Visits  Medication Dose Route Frequency Provider Last Rate Last Dose  . heparin lock flush 100 unit/mL  500 Units Intracatheter Once PRN Nicholas Lose, MD      . PACLitaxel (TAXOL) 156 mg in sodium chloride 0.9 % 250 mL chemo infusion (</= 63m/m2)  80 mg/m2 (Treatment Plan Recorded) Intravenous Once GNicholas Lose MD      . sodium chloride flush (NS) 0.9 % injection 10 mL  10 mL Intracatheter PRN GNicholas Lose MD      . trastuzumab (HERCEPTIN) 168 mg in sodium chloride 0.9 % 250 mL chemo infusion  2 mg/kg (Treatment Plan Recorded) Intravenous Once GNicholas Lose MD        PHYSICAL EXAMINATION: ECOG PERFORMANCE STATUS: 1 - Symptomatic but completely ambulatory  Vitals:   05/21/18 1325  BP: (!) 142/61  Pulse: 83  Resp: 18  Temp: 99.8 F (37.7 C)  SpO2: 100%   Filed Weights   05/21/18 1325  Weight: 186 lb 6.4 oz (84.6 kg)    GENERAL: alert, no distress and comfortable SKIN: skin color, texture, turgor are normal, no rashes or significant lesions EYES: normal, Conjunctiva are pink and non-injected, sclera clear OROPHARYNX: no exudate, no erythema and lips, buccal mucosa, and tongue normal  NECK: supple, thyroid normal size, non-tender, without nodularity LYMPH: no palpable lymphadenopathy in the cervical, axillary or inguinal LUNGS: clear to auscultation and percussion with normal breathing  effort HEART: regular rate & rhythm and no murmurs and no lower extremity edema ABDOMEN: abdomen soft, non-tender and normal bowel sounds MUSCULOSKELETAL: no cyanosis of digits and no clubbing  NEURO: alert & oriented x 3 with fluent speech, no focal motor/sensory deficits EXTREMITIES: No lower extremity edema  LABORATORY DATA:  I have reviewed the data as listed CMP Latest Ref Rng & Units 05/21/2018 05/13/2018 05/06/2018  Glucose 70 - 99 mg/dL 170(H) 109(H) 131(H)  BUN 8 - 23 mg/dL 15 15 18   Creatinine 0.44 - 1.00 mg/dL 0.90 0.79 0.78  Sodium 135 - 145 mmol/L 144 140 141  Potassium 3.5 - 5.1 mmol/L 4.1 3.6 3.5  Chloride 98 - 111 mmol/L 113(H) 109 108  CO2 22 - 32 mmol/L 23 23 23   Calcium 8.9 - 10.3 mg/dL 9.2 9.4 10.1  Total Protein 6.5 - 8.1 g/dL 7.1 7.4 7.4  Total Bilirubin 0.3 - 1.2 mg/dL <0.2(L) <0.2(L) <0.2(L)  Alkaline Phos 38 - 126 U/L 50 53 54  AST 15 - 41 U/L 18 13(L) 15  ALT 0 - 44 U/L 24 16 24     Lab Results  Component Value Date   WBC 6.2 05/21/2018   HGB 11.1 (L) 05/21/2018   HCT 34.5 (L) 05/21/2018   MCV 88.5 05/21/2018   PLT 350 05/21/2018   NEUTROABS 3.5 05/21/2018    ASSESSMENT & PLAN:  Malignant neoplasm of upper-outer quadrant of right breast in female, estrogen receptor positive ([place]) 02/04/2018:Screening mammogram detected right breast mass and asymmetry in the left breast.  The left breast asymmetry resolved.  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 Adj chemo with Taxol Herceptin weekly X 12 foll by Herceptin Q 3 weeks for 1 year started 04/22/2018 2. Adjuvant radiation therapy followed by 3. Adjuvant antiestrogen therapy  ------------------------------------------------------------------------------------------------------------------------ Current treatment: Taxol Herceptin week 5 Echocardiogram: 04/07/2018: EF 60 to 65% Labs reviewed Chemo toxicities: Does not have any nausea or vomiting or any other side effects to chemotherapy. Patient denies neuropathy.  Monitoring closely.  Return to clinic every week for Taxol and every 2 weeks for follow-up with me.  No orders of the defined types were placed in this encounter.  The patient has a good understanding of the overall plan. she agrees with it. she will call with any problems that may develop before the next visit here.  Nicholas Lose, MD 05/21/2018  Julious Oka Dorshimer am acting as scribe for Dr. Nicholas Lose.  I have reviewed the above documentation for accuracy and completeness, and I agree with the above.

## 2018-05-20 NOTE — Telephone Encounter (Signed)
Chart updated.  She did received doxy.me on her cell phone.

## 2018-05-21 ENCOUNTER — Ambulatory Visit (INDEPENDENT_AMBULATORY_CARE_PROVIDER_SITE_OTHER): Payer: Medicare Other | Admitting: Adult Health

## 2018-05-21 ENCOUNTER — Inpatient Hospital Stay (HOSPITAL_BASED_OUTPATIENT_CLINIC_OR_DEPARTMENT_OTHER): Payer: Medicare Other | Admitting: Hematology and Oncology

## 2018-05-21 ENCOUNTER — Inpatient Hospital Stay: Payer: Medicare Other

## 2018-05-21 ENCOUNTER — Encounter: Payer: Self-pay | Admitting: *Deleted

## 2018-05-21 ENCOUNTER — Inpatient Hospital Stay: Payer: Medicare Other | Attending: Hematology and Oncology

## 2018-05-21 ENCOUNTER — Encounter: Payer: Self-pay | Admitting: Adult Health

## 2018-05-21 ENCOUNTER — Other Ambulatory Visit: Payer: Self-pay

## 2018-05-21 DIAGNOSIS — Z5112 Encounter for antineoplastic immunotherapy: Secondary | ICD-10-CM | POA: Insufficient documentation

## 2018-05-21 DIAGNOSIS — Z79899 Other long term (current) drug therapy: Secondary | ICD-10-CM | POA: Insufficient documentation

## 2018-05-21 DIAGNOSIS — E119 Type 2 diabetes mellitus without complications: Secondary | ICD-10-CM | POA: Diagnosis not present

## 2018-05-21 DIAGNOSIS — Z170 Estrogen receptor positive status [ER+]: Secondary | ICD-10-CM

## 2018-05-21 DIAGNOSIS — Z7982 Long term (current) use of aspirin: Secondary | ICD-10-CM | POA: Diagnosis not present

## 2018-05-21 DIAGNOSIS — C50411 Malignant neoplasm of upper-outer quadrant of right female breast: Secondary | ICD-10-CM

## 2018-05-21 DIAGNOSIS — Z7902 Long term (current) use of antithrombotics/antiplatelets: Secondary | ICD-10-CM

## 2018-05-21 DIAGNOSIS — G4733 Obstructive sleep apnea (adult) (pediatric): Secondary | ICD-10-CM

## 2018-05-21 DIAGNOSIS — Z5111 Encounter for antineoplastic chemotherapy: Secondary | ICD-10-CM | POA: Insufficient documentation

## 2018-05-21 DIAGNOSIS — E785 Hyperlipidemia, unspecified: Secondary | ICD-10-CM

## 2018-05-21 DIAGNOSIS — H9202 Otalgia, left ear: Secondary | ICD-10-CM | POA: Insufficient documentation

## 2018-05-21 DIAGNOSIS — Z794 Long term (current) use of insulin: Secondary | ICD-10-CM

## 2018-05-21 DIAGNOSIS — Z95828 Presence of other vascular implants and grafts: Secondary | ICD-10-CM

## 2018-05-21 DIAGNOSIS — I63512 Cerebral infarction due to unspecified occlusion or stenosis of left middle cerebral artery: Secondary | ICD-10-CM

## 2018-05-21 DIAGNOSIS — I10 Essential (primary) hypertension: Secondary | ICD-10-CM

## 2018-05-21 DIAGNOSIS — Z9989 Dependence on other enabling machines and devices: Secondary | ICD-10-CM | POA: Diagnosis not present

## 2018-05-21 LAB — CBC WITH DIFFERENTIAL (CANCER CENTER ONLY)
Abs Immature Granulocytes: 0.01 10*3/uL (ref 0.00–0.07)
Basophils Absolute: 0 10*3/uL (ref 0.0–0.1)
Basophils Relative: 1 %
Eosinophils Absolute: 0.1 10*3/uL (ref 0.0–0.5)
Eosinophils Relative: 1 %
HCT: 34.5 % — ABNORMAL LOW (ref 36.0–46.0)
Hemoglobin: 11.1 g/dL — ABNORMAL LOW (ref 12.0–15.0)
Immature Granulocytes: 0 %
Lymphocytes Relative: 34 %
Lymphs Abs: 2.1 10*3/uL (ref 0.7–4.0)
MCH: 28.5 pg (ref 26.0–34.0)
MCHC: 32.2 g/dL (ref 30.0–36.0)
MCV: 88.5 fL (ref 80.0–100.0)
Monocytes Absolute: 0.5 10*3/uL (ref 0.1–1.0)
Monocytes Relative: 8 %
Neutro Abs: 3.5 10*3/uL (ref 1.7–7.7)
Neutrophils Relative %: 56 %
Platelet Count: 350 10*3/uL (ref 150–400)
RBC: 3.9 MIL/uL (ref 3.87–5.11)
RDW: 13.4 % (ref 11.5–15.5)
WBC Count: 6.2 10*3/uL (ref 4.0–10.5)
nRBC: 0 % (ref 0.0–0.2)

## 2018-05-21 LAB — CMP (CANCER CENTER ONLY)
ALT: 24 U/L (ref 0–44)
AST: 18 U/L (ref 15–41)
Albumin: 3.6 g/dL (ref 3.5–5.0)
Alkaline Phosphatase: 50 U/L (ref 38–126)
Anion gap: 8 (ref 5–15)
BUN: 15 mg/dL (ref 8–23)
CO2: 23 mmol/L (ref 22–32)
Calcium: 9.2 mg/dL (ref 8.9–10.3)
Chloride: 113 mmol/L — ABNORMAL HIGH (ref 98–111)
Creatinine: 0.9 mg/dL (ref 0.44–1.00)
GFR, Est AFR Am: >60 mL/min (ref >60)
GFR, Estimated: >60 mL/min (ref >60)
Glucose, Bld: 170 mg/dL — ABNORMAL HIGH (ref 70–99)
Potassium: 4.1 mmol/L (ref 3.5–5.1)
Sodium: 144 mmol/L (ref 135–145)
Total Bilirubin: <0.2 mg/dL — ABNORMAL LOW (ref 0.3–1.2)
Total Protein: 7.1 g/dL (ref 6.5–8.1)

## 2018-05-21 MED ORDER — ACETAMINOPHEN 325 MG PO TABS
650.0000 mg | ORAL_TABLET | Freq: Once | ORAL | Status: AC
  Administered 2018-05-21: 650 mg via ORAL

## 2018-05-21 MED ORDER — ACETAMINOPHEN 325 MG PO TABS
ORAL_TABLET | ORAL | Status: AC
  Filled 2018-05-21: qty 2

## 2018-05-21 MED ORDER — SODIUM CHLORIDE 0.9% FLUSH
10.0000 mL | Freq: Once | INTRAVENOUS | Status: AC
  Administered 2018-05-21: 10 mL
  Filled 2018-05-21: qty 10

## 2018-05-21 MED ORDER — FAMOTIDINE IN NACL 20-0.9 MG/50ML-% IV SOLN
INTRAVENOUS | Status: AC
  Filled 2018-05-21: qty 50

## 2018-05-21 MED ORDER — DIPHENHYDRAMINE HCL 50 MG/ML IJ SOLN
INTRAMUSCULAR | Status: AC
  Filled 2018-05-21: qty 1

## 2018-05-21 MED ORDER — FAMOTIDINE IN NACL 20-0.9 MG/50ML-% IV SOLN
20.0000 mg | Freq: Once | INTRAVENOUS | Status: AC
  Administered 2018-05-21: 20 mg via INTRAVENOUS

## 2018-05-21 MED ORDER — HEPARIN SOD (PORK) LOCK FLUSH 100 UNIT/ML IV SOLN
500.0000 [IU] | Freq: Once | INTRAVENOUS | Status: AC | PRN
  Administered 2018-05-21: 500 [IU]
  Filled 2018-05-21: qty 5

## 2018-05-21 MED ORDER — SODIUM CHLORIDE 0.9 % IV SOLN
Freq: Once | INTRAVENOUS | Status: AC
  Administered 2018-05-21: 15:00:00 via INTRAVENOUS
  Filled 2018-05-21: qty 250

## 2018-05-21 MED ORDER — TRASTUZUMAB CHEMO 150 MG IV SOLR
2.0000 mg/kg | Freq: Once | INTRAVENOUS | Status: AC
  Administered 2018-05-21: 168 mg via INTRAVENOUS
  Filled 2018-05-21: qty 8

## 2018-05-21 MED ORDER — DEXAMETHASONE SODIUM PHOSPHATE 10 MG/ML IJ SOLN
INTRAMUSCULAR | Status: AC
  Filled 2018-05-21: qty 1

## 2018-05-21 MED ORDER — SODIUM CHLORIDE 0.9% FLUSH
10.0000 mL | INTRAVENOUS | Status: DC | PRN
  Administered 2018-05-21: 17:00:00 10 mL
  Filled 2018-05-21: qty 10

## 2018-05-21 MED ORDER — SODIUM CHLORIDE 0.9 % IV SOLN
80.0000 mg/m2 | Freq: Once | INTRAVENOUS | Status: AC
  Administered 2018-05-21: 16:00:00 156 mg via INTRAVENOUS
  Filled 2018-05-21: qty 26

## 2018-05-21 MED ORDER — DEXAMETHASONE SODIUM PHOSPHATE 10 MG/ML IJ SOLN
8.0000 mg | Freq: Once | INTRAMUSCULAR | Status: AC
  Administered 2018-05-21: 14:00:00 8 mg via INTRAVENOUS

## 2018-05-21 MED ORDER — DIPHENHYDRAMINE HCL 50 MG/ML IJ SOLN
50.0000 mg | Freq: Once | INTRAMUSCULAR | Status: AC
  Administered 2018-05-21: 50 mg via INTRAVENOUS

## 2018-05-21 NOTE — Progress Notes (Signed)
I agree with the above plan 

## 2018-05-21 NOTE — Assessment & Plan Note (Signed)
02/04/2018:Screening mammogram detected right breast mass and asymmetry in the left breast.  The left breast asymmetry resolved.  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 Adj chemo with Taxol Herceptin weekly X 12 foll by Herceptin Q 3 weeks for 1 year started 04/22/2018 2. Adjuvant radiation therapy followed by 3. Adjuvant antiestrogen therapy ------------------------------------------------------------------------------------------------------------------------ Current treatment: Taxol Herceptin week 5 Echocardiogram: 04/07/2018: EF 60 to 65% Labs reviewed Chemo toxicities: Does not have any nausea or vomiting or any other side effects to chemotherapy. Patient denies neuropathy.  Monitoring closely.  Return to clinic every week for Taxol and every 2 weeks for follow-up with me.

## 2018-05-21 NOTE — Patient Instructions (Signed)
[facility] Discharge Instructions for Patients Receiving Chemotherapy  Today you received the following chemotherapy agents :  Herceptin,  Taxol.  To help prevent nausea and vomiting after your treatment, we encourage you to take your nausea medication as prescribed.   If you develop nausea and vomiting that is not controlled by your nausea medication, call the clinic.   BELOW ARE SYMPTOMS THAT SHOULD BE REPORTED IMMEDIATELY:  *FEVER GREATER THAN 100.5 F  *CHILLS WITH OR WITHOUT FEVER  NAUSEA AND VOMITING THAT IS NOT CONTROLLED WITH YOUR NAUSEA MEDICATION  *UNUSUAL SHORTNESS OF BREATH  *UNUSUAL BRUISING OR BLEEDING  TENDERNESS IN MOUTH AND THROAT WITH OR WITHOUT PRESENCE OF ULCERS  *URINARY PROBLEMS  *BOWEL PROBLEMS  UNUSUAL RASH Items with * indicate a potential emergency and should be followed up as soon as possible.  Feel free to call the clinic should you have any questions or concerns. The clinic phone number is [phone].  Please show the CHEMO ALERT CARD at check-in to the Emergency Department and triage nurse.   

## 2018-05-21 NOTE — Patient Instructions (Signed)
Continue current regimen.  No changes at this time.

## 2018-05-21 NOTE — Progress Notes (Signed)
[facility] [address] [place]. [place] [address] [phone]        FOLLOW UP NOTE  [name] Date of Birth:  [birth year] Medical Record Number:  [id number]   Reason for visit: Stroke follow up  Virtual Visit via Video Note  I connected with Sandra Shelton on 05/21/18 at  9:15 AM EDT by a video enabled telemedicine application located remotely in my own home and verified that I am speaking with the correct person using two identifiers who was located at their own home.   I discussed the limitations of evaluation and management by telemedicine and the availability of in person appointments. The patient expressed understanding and agreed to proceed.   HPI:   Sandra Shelton is a 63 y.o. female with underlying medical history of HTN, DM, HLD and left MCA infarct in setting of diabetic ketoacidosis and high-grade proximal left ICA stenosis in 06/2017 s/p left TCAR carotid stent 07/2017.  She was initially scheduled today for office stroke follow-up visit but due to COVID-19 safety precautions, visit transition to telemedicine via [email or web address] with patient's consent.  She has been stable from a stroke standpoint with residual deficit of mild decreased right hand dexterity and headaches.  Headaches have been stable without use of topamax. She stopped medication as her headaches improved.  Continues on aspirin and Plavix without side effects of bleeding or bruising.  Continues on rosuvastatin without side effects myalgias.  Blood pressure 148/63.  Glucose levels have been fluctuating with this morning readings 112, fasting.  Follow-up with vascular surgery on 02/20/2018 with stable carotid ultrasound and recommended repeating in 6 months time.  She did undergo sleep study on 03/17/2018 and was found moderate OSA and recommended initiating CPAP therapy which was started on 05/15/2018.  She used the first night all night but upon awakening, she experienced a headache located on her  forehead where the front strap of her mask is placed.  She has not returned to using at this time but plans on restarting tonight.  She was also recently diagnosed with breast cancer and is currently undergoing chemotherapy.  States energy levels have been okay and is able to still continue to do things around her home.  No further concerns at this time.        ROS:   14 system review of systems performed and negative with exception of weakness, apnea, headache  PMH:  Past Medical History:  Diagnosis Date  . Breast cancer ([place])    right IDC  . Diabetes mellitus without complication ([place])    type 2  . Embolic stroke involving carotid artery ([place])   . GERD (gastroesophageal reflux disease)   . Heart murmur    no problems with it  . Hepatitis    in the 80's, took meds at the time   . Hypertension   . Peripheral vascular disease ([facility])    carotid artery blockage   . Stroke ([place]) 07/12/2017  . Vision abnormalities     PSH:  Past Surgical History:  Procedure Laterality Date  . BREAST LUMPECTOMY WITH RADIOACTIVE SEED AND SENTINEL LYMPH NODE BIOPSY Right 03/24/2018   Procedure: RIGHT BREAST LUMPECTOMY WITH RADIOACTIVE SEED AND RIGHT AXILLARY DEEP SENTINEL LYMPH NODE BIOPSY INJECT BLUE DYE RIGHT BREAST;  Surgeon: Fanny Skates, MD;  Location: [facility];  Service: General;  Laterality: Right;  . COLONOSCOPY    . PORTACATH PLACEMENT N/A 04/09/2018   Procedure: INSERTION PORT-A-CATH WITH ULTRASOUND;  Surgeon: Fanny Skates, MD;  Location: [facility];  Service: General;  Laterality: N/A;  . TRANSCAROTID ARTERY REVASCULARIZATION Left 07/24/2017   Procedure: TRANSCAROTID ARTERY REVASCULARIZATION, left;  Surgeon: Elam Dutch, MD;  Location: [facility];  Service: Vascular;  Laterality: Left;  . UTERINE FIBROID SURGERY     2005    Social History:  Social History   Socioeconomic History  . Marital status: Single    Spouse name: Not on file  . Number of  children: Not on file  . Years of education: Not on file  . Highest education level: Not on file  Occupational History  . Not on file  Social Needs  . Financial resource strain: Not on file  . Food insecurity:    Worry: Not on file    Inability: Not on file  . Transportation needs:    Medical: Not on file    Non-medical: Not on file  Tobacco Use  . Smoking status: Never Smoker  . Smokeless tobacco: Never Used  Substance and Sexual Activity  . Alcohol use: No  . Drug use: No  . Sexual activity: Not on file  Lifestyle  . Physical activity:    Days per week: Not on file    Minutes per session: Not on file  . Stress: Not on file  Relationships  . Social connections:    Talks on phone: Not on file    Gets together: Not on file    Attends religious service: Not on file    Active member of club or organization: Not on file    Attends meetings of clubs or organizations: Not on file    Relationship status: Not on file  . Intimate partner violence:    Fear of current or ex partner: Not on file    Emotionally abused: Not on file    Physically abused: Not on file    Forced sexual activity: Not on file  Other Topics Concern  . Not on file  Social History Narrative   Lives home alone.  Single.  Education Some college.  No children.      Family History:  Family History  Problem Relation Age of Onset  . Diabetes Mellitus II Sister   . Stroke Mother   . Diabetes Mellitus II Mother   . Cancer Father   . Diabetes Mellitus II Brother   . Breast cancer Neg Hx     Medications:   Current Outpatient Medications on File Prior to Visit  Medication Sig Dispense Refill  . amLODipine (NORVASC) 10 MG tablet Take 1 tablet (10 mg total) by mouth daily. 30 tablet 6  . aspirin 81 MG tablet Take 1 tablet (81 mg total) by mouth daily. 100 tablet 2  . blood glucose meter kit and supplies KIT Dispense based on patient and insurance preference. Use up to four times daily as directed. (FOR ICD-9  250.00, 250.01). 1 each 0  . clopidogrel (PLAVIX) 75 MG tablet Take 1 tablet (75 mg total) by mouth daily. 30 tablet 6  . INSULIN SYRINGE .5CC/29G 29G X 1/2" 0.5 ML MISC For insulin injection 100 each 0  . LANTUS SOLOSTAR 100 UNIT/ML Solostar Pen Inject 7 Units into the skin 2 (two) times daily. 15 mL 6  . lidocaine-prilocaine (EMLA) cream Apply 1 application topically as needed. 30 g 0  . lisinopril (PRINIVIL,ZESTRIL) 5 MG tablet Take 1 tablet (5 mg total) by mouth daily. 90 tablet 6  . metFORMIN (GLUCOPHAGE) 500 MG tablet Take 1 tablet (500 mg  total) by mouth 2 (two) times daily with a meal. 60 tablet 6  . Syringe/Needle, Disp, (SYRINGE 3CC/27GX1-1/4") 27G X 1-1/4" 3 ML MISC For insulin injection 100 each 0  . topiramate (TOPAMAX) 25 MG tablet Take 1 tablet (25 mg total) by mouth every evening. 30 tablet 6   No current facility-administered medications on file prior to visit.     Allergies:  No Known Allergies   Physical Exam  General: well developed, well nourished, pleasant middle-aged African-American female, seated, in no evident distress Head: head normocephalic and atraumatic.    Neurologic Exam Mental Status: Awake and fully alert. Oriented to place and time. Recent and remote memory intact. Attention span, concentration and fund of knowledge appropriate. Mood and affect appropriate.  Cranial Nerves: Extraocular movements full without nystagmus. Hearing intact to voice.   Mild right facial droop present.  Tongue protrusion midline.  Shoulder shrug symmetric. Motor: No evidence of weakness per drift assessment Sensory.:  Light touch intact Coordination: Rapid alternating movements normal in all extremities. Finger-to-nose and heel-to-shin performed accurately bilaterally.  Gait and Station: Arises from chair without difficulty. Stance is normal. Gait demonstrates normal stride length and balance .  Reflexes: UTA    Diagnostic Data (Labs, Imaging, Testing)  B Carotid Duplex  (02/20/18):   R ICA stenosis:  1-39%  R VA:  patent and antegrade  L ICA stenosis:  Patent stent without hemodynamically significant in-stent stenosis; remainder of ICA 1 to 39% stenosis  L VA:  patent and antegrade  Split-night study 03/17/2018 IMPRESSION: 1. Complex, mostly Obstructive Sleep Apnea (OSA)at a moderate  degree. AHI was 14.1/h,not supine dependent and strongly REM  sleep dependent. AHI in REM was 37/h. 2. Thunderous snoring - Snoring was recorded before the patient  fell asleep, in drowsiness.  3. No sleep hypoxemia.  4. normal EKG     ASSESSMENT: Zaray Gatchel is a 63 y.o. year old female here with left MCA infarcts on 07/08/2017 in setting of diabetic ketoacidosis and high-grade proximal left ICA stenosis s/p left TCAR carotid stent 07/2017. Vascular risk factors include HLD, HTN and DM.  She has been stable from a stroke standpoint with subjective mild right hand decreased dexterity.  Recent diagnosis of OSA with initiation of CPAP.  Recent diagnosis of breast cancer currently undergoing chemotherapy.    PLAN: -Continue aspirin 81 mg daily and clopidogrel 75 mg daily  and Crestor for secondary stroke prevention -F/u with PCP regarding your HLD, HTN and DM management -f/u with Dr. Oneida Alar as scheduled along with repeat carotid Doppler in 08/2018 -Discussion regarding importance of CPAP compliance and to reach out to DME company if she continues to have difficulty tolerating mask -scheduled follow-up appointment 07/2018 for initial CPAP compliance visit -Advised to continue to stay active and maintain a healthy diet -Continue to monitor blood pressure at home -Maintain strict control of hypertension with blood pressure goal below 130/90, diabetes with hemoglobin A1c goal below 6.5% and cholesterol with LDL cholesterol (bad cholesterol) goal below 70 mg/dL. I also advised the patient to eat a healthy diet with plenty of whole grains, cereals, fruits and vegetables, exercise  regularly and maintain ideal body weight.    Greater than 50% of time during this 25 minute visit was spent on counseling,explanation of diagnosis of left MCA infarct, reviewing risk factor management of HLD, HTN and DM, discussion regarding OSA and CPAP use, planning of further management, discussion with patient and answering all questions to patient satisfaction  Venancio Poisson, AGNP-BC  [facility] [address] [place], [place] [address]  Phone [phone] Fax [phone]

## 2018-05-26 ENCOUNTER — Other Ambulatory Visit: Payer: Self-pay | Admitting: Internal Medicine

## 2018-05-26 ENCOUNTER — Other Ambulatory Visit: Payer: Self-pay | Admitting: Hematology and Oncology

## 2018-05-26 DIAGNOSIS — I779 Disorder of arteries and arterioles, unspecified: Secondary | ICD-10-CM

## 2018-05-26 DIAGNOSIS — E1159 Type 2 diabetes mellitus with other circulatory complications: Secondary | ICD-10-CM

## 2018-05-26 DIAGNOSIS — I10 Essential (primary) hypertension: Secondary | ICD-10-CM

## 2018-05-26 DIAGNOSIS — Z8673 Personal history of transient ischemic attack (TIA), and cerebral infarction without residual deficits: Secondary | ICD-10-CM

## 2018-05-26 DIAGNOSIS — Z794 Long term (current) use of insulin: Secondary | ICD-10-CM

## 2018-05-26 MED FILL — ROSUVASTATIN CALCIUM 40 MG: 40 | 60 days supply | Qty: 60 | Fill #5

## 2018-05-26 MED FILL — AMLODIPINE BESYLATE 10 MG T: 10 | 30 days supply | Qty: 30 | Fill #0

## 2018-05-26 MED FILL — LISINOPRIL 5 MG TAB: 5 | 90 days supply | Qty: 90 | Fill #5

## 2018-05-26 MED FILL — CLOPIDOGREL 75 MG TABLET: 75 | 30 days supply | Qty: 30 | Fill #0

## 2018-05-26 MED FILL — metFORMIN HCL 500 MG TABS: 500 | 30 days supply | Qty: 60 | Fill #0

## 2018-05-27 ENCOUNTER — Other Ambulatory Visit: Payer: Self-pay

## 2018-05-27 ENCOUNTER — Inpatient Hospital Stay: Payer: Medicare Other

## 2018-05-27 ENCOUNTER — Encounter: Payer: Self-pay | Admitting: *Deleted

## 2018-05-27 VITALS — BP 152/68 | HR 87 | Temp 98.3°F | Resp 16

## 2018-05-27 DIAGNOSIS — H9202 Otalgia, left ear: Secondary | ICD-10-CM | POA: Diagnosis not present

## 2018-05-27 DIAGNOSIS — C50411 Malignant neoplasm of upper-outer quadrant of right female breast: Secondary | ICD-10-CM

## 2018-05-27 DIAGNOSIS — Z170 Estrogen receptor positive status [ER+]: Secondary | ICD-10-CM | POA: Diagnosis not present

## 2018-05-27 DIAGNOSIS — Z5111 Encounter for antineoplastic chemotherapy: Secondary | ICD-10-CM | POA: Diagnosis not present

## 2018-05-27 DIAGNOSIS — Z95828 Presence of other vascular implants and grafts: Secondary | ICD-10-CM

## 2018-05-27 DIAGNOSIS — Z5112 Encounter for antineoplastic immunotherapy: Secondary | ICD-10-CM | POA: Diagnosis not present

## 2018-05-27 DIAGNOSIS — Z7982 Long term (current) use of aspirin: Secondary | ICD-10-CM | POA: Diagnosis not present

## 2018-05-27 LAB — CMP (CANCER CENTER ONLY)
ALT: 32 U/L (ref 0–44)
AST: 21 U/L (ref 15–41)
Albumin: 3.8 g/dL (ref 3.5–5.0)
Alkaline Phosphatase: 55 U/L (ref 38–126)
Anion gap: 9 (ref 5–15)
BUN: 14 mg/dL (ref 8–23)
CO2: 24 mmol/L (ref 22–32)
Calcium: 9.6 mg/dL (ref 8.9–10.3)
Chloride: 108 mmol/L (ref 98–111)
Creatinine: 0.83 mg/dL (ref 0.44–1.00)
GFR, Est AFR Am: >60 mL/min (ref >60)
GFR, Estimated: >60 mL/min (ref >60)
Glucose, Bld: 133 mg/dL — ABNORMAL HIGH (ref 70–99)
Potassium: 3.8 mmol/L (ref 3.5–5.1)
Sodium: 141 mmol/L (ref 135–145)
Total Bilirubin: <0.2 mg/dL — ABNORMAL LOW (ref 0.3–1.2)
Total Protein: 7.5 g/dL (ref 6.5–8.1)

## 2018-05-27 LAB — CBC WITH DIFFERENTIAL (CANCER CENTER ONLY)
Abs Immature Granulocytes: 0.01 10*3/uL (ref 0.00–0.07)
Basophils Absolute: 0 10*3/uL (ref 0.0–0.1)
Basophils Relative: 1 %
Eosinophils Absolute: 0.1 10*3/uL (ref 0.0–0.5)
Eosinophils Relative: 1 %
HCT: 35.4 % — ABNORMAL LOW (ref 36.0–46.0)
Hemoglobin: 11.4 g/dL — ABNORMAL LOW (ref 12.0–15.0)
Immature Granulocytes: 0 %
Lymphocytes Relative: 40 %
Lymphs Abs: 2.3 10*3/uL (ref 0.7–4.0)
MCH: 28.2 pg (ref 26.0–34.0)
MCHC: 32.2 g/dL (ref 30.0–36.0)
MCV: 87.6 fL (ref 80.0–100.0)
Monocytes Absolute: 0.3 10*3/uL (ref 0.1–1.0)
Monocytes Relative: 6 %
Neutro Abs: 3.1 10*3/uL (ref 1.7–7.7)
Neutrophils Relative %: 52 %
Platelet Count: 308 10*3/uL (ref 150–400)
RBC: 4.04 MIL/uL (ref 3.87–5.11)
RDW: 13.3 % (ref 11.5–15.5)
WBC Count: 5.9 10*3/uL (ref 4.0–10.5)
nRBC: 0 % (ref 0.0–0.2)

## 2018-05-27 MED ORDER — FAMOTIDINE IN NACL 20-0.9 MG/50ML-% IV SOLN
20.0000 mg | Freq: Once | INTRAVENOUS | Status: AC
  Administered 2018-05-27: 15:00:00 20 mg via INTRAVENOUS

## 2018-05-27 MED ORDER — TRASTUZUMAB CHEMO 150 MG IV SOLR
2.0000 mg/kg | Freq: Once | INTRAVENOUS | Status: AC
  Administered 2018-05-27: 15:00:00 168 mg via INTRAVENOUS
  Filled 2018-05-27: qty 8

## 2018-05-27 MED ORDER — SODIUM CHLORIDE 0.9 % IV SOLN
80.0000 mg/m2 | Freq: Once | INTRAVENOUS | Status: AC
  Administered 2018-05-27: 16:00:00 156 mg via INTRAVENOUS
  Filled 2018-05-27: qty 26

## 2018-05-27 MED ORDER — SODIUM CHLORIDE 0.9% FLUSH
10.0000 mL | Freq: Once | INTRAVENOUS | Status: AC
  Administered 2018-05-27: 10 mL
  Filled 2018-05-27: qty 10

## 2018-05-27 MED ORDER — DEXAMETHASONE SODIUM PHOSPHATE 10 MG/ML IJ SOLN
INTRAMUSCULAR | Status: AC
  Filled 2018-05-27: qty 1

## 2018-05-27 MED ORDER — HEPARIN SOD (PORK) LOCK FLUSH 100 UNIT/ML IV SOLN
500.0000 [IU] | Freq: Once | INTRAVENOUS | Status: AC | PRN
  Administered 2018-05-27: 500 [IU]
  Filled 2018-05-27: qty 5

## 2018-05-27 MED ORDER — DIPHENHYDRAMINE HCL 50 MG/ML IJ SOLN
INTRAMUSCULAR | Status: AC
  Filled 2018-05-27: qty 1

## 2018-05-27 MED ORDER — FAMOTIDINE IN NACL 20-0.9 MG/50ML-% IV SOLN
INTRAVENOUS | Status: AC
  Filled 2018-05-27: qty 50

## 2018-05-27 MED ORDER — DEXAMETHASONE SODIUM PHOSPHATE 10 MG/ML IJ SOLN
8.0000 mg | Freq: Once | INTRAMUSCULAR | Status: AC
  Administered 2018-05-27: 8 mg via INTRAVENOUS

## 2018-05-27 MED ORDER — DIPHENHYDRAMINE HCL 50 MG/ML IJ SOLN
50.0000 mg | Freq: Once | INTRAMUSCULAR | Status: AC
  Administered 2018-05-27: 50 mg via INTRAVENOUS

## 2018-05-27 MED ORDER — SODIUM CHLORIDE 0.9 % IV SOLN
Freq: Once | INTRAVENOUS | Status: AC
  Administered 2018-05-27: 14:00:00 via INTRAVENOUS
  Filled 2018-05-27: qty 250

## 2018-05-27 MED ORDER — ACETAMINOPHEN 325 MG PO TABS
650.0000 mg | ORAL_TABLET | Freq: Once | ORAL | Status: AC
  Administered 2018-05-27: 14:00:00 650 mg via ORAL

## 2018-05-27 MED ORDER — ACETAMINOPHEN 325 MG PO TABS
ORAL_TABLET | ORAL | Status: AC
  Filled 2018-05-27: qty 2

## 2018-05-27 MED ORDER — SODIUM CHLORIDE 0.9% FLUSH
10.0000 mL | INTRAVENOUS | Status: DC | PRN
  Administered 2018-05-27: 17:00:00 10 mL
  Filled 2018-05-27: qty 10

## 2018-05-27 MED FILL — LIDOCAINE-PRILOCAINE CREAM: 2.5-2.5 | 30 days supply | Qty: 30 | Fill #0

## 2018-05-27 NOTE — Patient Instructions (Signed)
[facility] Discharge Instructions for Patients Receiving Chemotherapy  Today you received the following chemotherapy agents :  Herceptin,  Taxol.  To help prevent nausea and vomiting after your treatment, we encourage you to take your nausea medication as prescribed.   If you develop nausea and vomiting that is not controlled by your nausea medication, call the clinic.   BELOW ARE SYMPTOMS THAT SHOULD BE REPORTED IMMEDIATELY:  *FEVER GREATER THAN 100.5 F  *CHILLS WITH OR WITHOUT FEVER  NAUSEA AND VOMITING THAT IS NOT CONTROLLED WITH YOUR NAUSEA MEDICATION  *UNUSUAL SHORTNESS OF BREATH  *UNUSUAL BRUISING OR BLEEDING  TENDERNESS IN MOUTH AND THROAT WITH OR WITHOUT PRESENCE OF ULCERS  *URINARY PROBLEMS  *BOWEL PROBLEMS  UNUSUAL RASH Items with * indicate a potential emergency and should be followed up as soon as possible.  Feel free to call the clinic should you have any questions or concerns. The clinic phone number is [phone].  Please show the CHEMO ALERT CARD at check-in to the Emergency Department and triage nurse.   

## 2018-05-28 NOTE — Assessment & Plan Note (Signed)
02/04/2018:Screening mammogram detected right breast mass and asymmetry in the left breast. The left breast asymmetry resolved. 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 Adj chemo with Taxol Herceptin weekly X 12 foll by Herceptin Q 3 weeks for 1 yearstarted 04/22/2018 2. Adjuvant radiation therapy followed by 3. Adjuvant antiestrogen therapy ------------------------------------------------------------------------------------------------------------------------ Current treatment: Taxol Herceptin week7  Echocardiogram:04/07/2018: EF 60 to 65% Labs reviewed  Chemo toxicities: Does not have any nausea or vomiting or any other side effects to chemotherapy. Patient denies neuropathy.  Monitoring closely.  Return to clinicevery week for Taxol and every 2 weeks for follow-up with me.

## 2018-06-02 NOTE — Progress Notes (Signed)
Patient Care Team: [name], MD as PCP - General (Internal Medicine) Fanny Skates, MD as Consulting Physician (General Surgery) Nicholas Lose, MD as Consulting Physician (Hematology and Oncology) Gery Pray, MD as Consulting Physician (Radiation Oncology)  DIAGNOSIS:    ICD-10-CM   1. Malignant neoplasm of upper-outer quadrant of right breast in female, estrogen receptor positive ([place]) C50.411    Z17.0     SUMMARY OF ONCOLOGIC HISTORY:   Malignant neoplasm of upper-outer quadrant of right breast in female, estrogen receptor positive ([place])   01/27/2018 Mammogram    Screening Mammogram  Possible right breast mass; asymmetry in left breast.    02/10/2018 Initial Diagnosis    Screening mammogram detected right breast mass and asymmetry in the left breast.  The left breast asymmetry resolved.  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%, HER-2 +3+ by IHC, T1BN0 stage Ia clinical stage    03/24/2018 Surgery    Lumpectomy and SLNB: IDC, 1.2cm, grade 2, margins negative, 4 SLN negative, T1cN0, previously ER/PR/HER-2 positive with Ki-67 of 20%    03/24/2018 Cancer Staging    Staging form: Breast, AJCC 8th Edition - Pathologic stage from 03/24/2018: Stage IA (pT1c, pN0, cM0, G2, ER+, PR+, HER2+) - Signed by Gardenia Phlegm, NP on 03/26/2018    04/22/2018 -  Chemotherapy    The patient had trastuzumab (HERCEPTIN) 336 mg in sodium chloride 0.9 % 250 mL chemo infusion, 4 mg/kg = 336 mg, Intravenous,  Once, 2 of 16 cycles Administration: 336 mg (04/22/2018), 168 mg (04/29/2018), 168 mg (05/21/2018), 168 mg (05/06/2018), 168 mg (05/13/2018), 168 mg (05/27/2018) PACLitaxel (TAXOL) 156 mg in sodium chloride 0.9 % 250 mL chemo infusion (</= 69m/m2), 80 mg/m2 = 156 mg, Intravenous,  Once, 2 of 3 cycles Administration: 156 mg (04/22/2018), 156 mg (04/29/2018), 156 mg (05/21/2018), 156  mg (05/06/2018), 156 mg (05/13/2018), 156 mg (05/27/2018)  for chemotherapy treatment.      CHIEF COMPLIANT: Cycle 7 Taxol Herceptin  INTERVAL HISTORY: Sandra Cremeansis a 63y.o. with above-mentioned history of right breast cancer who underwent a lumpectomy and presents to the clinic todayfor cycle 7 ofadjuvant chemotherapy with weekly Taxol and Herceptin. She complains of left ear pain  REVIEW OF SYSTEMS:   Constitutional: Denies fevers, chills or abnormal weight loss Eyes: Denies blurriness of vision Ears, nose, mouth, throat, and face: Left ear pain for the last 2 to 3 days Respiratory: Denies cough, dyspnea or wheezes Cardiovascular: Denies palpitation, chest discomfort Gastrointestinal: Denies nausea, heartburn or change in bowel habits Skin: Denies abnormal skin rashes Lymphatics: Denies new lymphadenopathy or easy bruising Neurological: Denies numbness, tingling or new weaknesses Behavioral/Psych: Mood is stable, no new changes  Extremities: No lower extremity edema Breast: denies any pain or lumps or nodules in either breasts All other systems were reviewed with the patient and are negative.  I have reviewed the past medical history, past surgical history, social history and family history with the patient and they are unchanged from previous note.  ALLERGIES:  has No Known Allergies.  MEDICATIONS:  Current Outpatient Medications  Medication Sig Dispense Refill  . amLODipine (NORVASC) 10 MG tablet TAKE 1 TABLET BY MOUTH DAILY. 30 tablet 2  . aspirin 81 MG tablet Take 1 tablet (81 mg total) by mouth daily. 100 tablet 2  . blood glucose meter kit and supplies KIT Dispense based on patient and  insurance preference. Use up to four times daily as directed. (FOR ICD-9 250.00, 250.01). 1 each 0  . clopidogrel (PLAVIX) 75 MG tablet TAKE 1 TABLET BY MOUTH DAILY. 30 tablet 2  . INSULIN SYRINGE .5CC/29G 29G X 1/2" 0.5 ML MISC For insulin injection 100 each 0  . LANTUS SOLOSTAR 100  UNIT/ML Solostar Pen Inject 7 Units into the skin 2 (two) times daily. 15 mL 6  . lidocaine-prilocaine (EMLA) cream APPLY 1 APPLICATION TOPICALLY AS NEEDED. 30 g 0  . lisinopril (PRINIVIL,ZESTRIL) 5 MG tablet Take 1 tablet (5 mg total) by mouth daily. 90 tablet 6  . metFORMIN (GLUCOPHAGE) 500 MG tablet TAKE 1 TABLET BY MOUTH 2 TIMES DAILY WITH A MEAL. 60 tablet 2  . rosuvastatin (CRESTOR) 40 MG tablet Take 40 mg by mouth daily.    . Syringe/Needle, Disp, (SYRINGE 3CC/27GX1-1/4") 27G X 1-1/4" 3 ML MISC For insulin injection 100 each 0   No current facility-administered medications for this visit.     PHYSICAL EXAMINATION: ECOG PERFORMANCE STATUS: 1 - Symptomatic but completely ambulatory  Vitals:   06/03/18 1337  BP: (!) 142/87  Pulse: 90  Resp: 18  Temp: 98.7 F (37.1 C)  SpO2: 100%   Filed Weights   06/03/18 1337  Weight: 187 lb (84.8 kg)    GENERAL: alert, no distress and comfortable SKIN: skin color, texture, turgor are normal, no rashes or significant lesions, left ear redness EYES: normal, Conjunctiva are pink and non-injected, sclera clear OROPHARYNX: no exudate, no erythema and lips, buccal mucosa, and tongue normal  NECK: supple, thyroid normal size, non-tender, without nodularity LYMPH: no palpable lymphadenopathy in the cervical, axillary or inguinal LUNGS: clear to auscultation and percussion with normal breathing effort HEART: regular rate & rhythm and no murmurs and no lower extremity edema ABDOMEN: abdomen soft, non-tender and normal bowel sounds MUSCULOSKELETAL: no cyanosis of digits and no clubbing  NEURO: alert & oriented x 3 with fluent speech, no focal motor/sensory deficits EXTREMITIES: No lower extremity edema  LABORATORY DATA:  I have reviewed the data as listed CMP Latest Ref Rng & Units 05/27/2018 05/21/2018 05/13/2018  Glucose 70 - 99 mg/dL 133(H) 170(H) 109(H)  BUN 8 - 23 mg/dL 14 15 15   Creatinine 0.44 - 1.00 mg/dL 0.83 0.90 0.79  Sodium 135 - 145  mmol/L 141 144 140  Potassium 3.5 - 5.1 mmol/L 3.8 4.1 3.6  Chloride 98 - 111 mmol/L 108 113(H) 109  CO2 22 - 32 mmol/L 24 23 23   Calcium 8.9 - 10.3 mg/dL 9.6 9.2 9.4  Total Protein 6.5 - 8.1 g/dL 7.5 7.1 7.4  Total Bilirubin 0.3 - 1.2 mg/dL <0.2(L) <0.2(L) <0.2(L)  Alkaline Phos 38 - 126 U/L 55 50 53  AST 15 - 41 U/L 21 18 13(L)  ALT 0 - 44 U/L 32 24 16    Lab Results  Component Value Date   WBC 6.0 06/03/2018   HGB 11.6 (L) 06/03/2018   HCT 35.8 (L) 06/03/2018   MCV 87.7 06/03/2018   PLT 331 06/03/2018   NEUTROABS 3.5 06/03/2018    ASSESSMENT & PLAN:  Malignant neoplasm of upper-outer quadrant of right breast in female, estrogen receptor positive ([facility]) 02/04/2018:Screening mammogram detected right breast mass and asymmetry in the left breast. The left breast asymmetry resolved. 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 Adj chemo with Taxol Herceptin weekly X 12 foll by Herceptin Q 3 weeks for 1 yearstarted 04/22/2018 2. Adjuvant radiation therapy followed by 3. Adjuvant antiestrogen therapy ------------------------------------------------------------------------------------------------------------------------ Current treatment: Taxol Herceptin week7  Echocardiogram:04/07/2018: EF 60 to 65% Labs reviewed  Chemo toxicities: Does not have any nausea or vomiting or any other side effects to chemotherapy. Patient denies neuropathy.  Patient complains of left ear pain: On examination there is a red inflamed area in the external ear canal.  She does not remember using any instruments into the ear.  Since she does not have any fever and the pain is improving slowly we elected to  watch and monitor.  Monitoring closely. She stays active and bakes regularly. Return to clinicevery week for Taxol and every 2 weeks for follow-up with me.  No orders of the defined types were placed in this encounter.  The patient has a good understanding of the overall plan. she agrees with it. she will call with any problems that may develop before the next visit here.  Nicholas Lose, MD 06/03/2018  Julious Oka Dorshimer am acting as scribe for Dr. Nicholas Lose.  I have reviewed the above documentation for accuracy and completeness, and I agree with the above.

## 2018-06-03 ENCOUNTER — Inpatient Hospital Stay: Payer: Medicare Other

## 2018-06-03 ENCOUNTER — Other Ambulatory Visit: Payer: Self-pay

## 2018-06-03 ENCOUNTER — Inpatient Hospital Stay (HOSPITAL_BASED_OUTPATIENT_CLINIC_OR_DEPARTMENT_OTHER): Payer: Medicare Other | Admitting: Hematology and Oncology

## 2018-06-03 DIAGNOSIS — Z79899 Other long term (current) drug therapy: Secondary | ICD-10-CM

## 2018-06-03 DIAGNOSIS — C50411 Malignant neoplasm of upper-outer quadrant of right female breast: Secondary | ICD-10-CM

## 2018-06-03 DIAGNOSIS — Z794 Long term (current) use of insulin: Secondary | ICD-10-CM

## 2018-06-03 DIAGNOSIS — H9202 Otalgia, left ear: Secondary | ICD-10-CM | POA: Diagnosis not present

## 2018-06-03 DIAGNOSIS — Z7982 Long term (current) use of aspirin: Secondary | ICD-10-CM | POA: Diagnosis not present

## 2018-06-03 DIAGNOSIS — Z95828 Presence of other vascular implants and grafts: Secondary | ICD-10-CM

## 2018-06-03 DIAGNOSIS — Z170 Estrogen receptor positive status [ER+]: Secondary | ICD-10-CM

## 2018-06-03 DIAGNOSIS — Z7902 Long term (current) use of antithrombotics/antiplatelets: Secondary | ICD-10-CM | POA: Diagnosis not present

## 2018-06-03 DIAGNOSIS — Z5112 Encounter for antineoplastic immunotherapy: Secondary | ICD-10-CM | POA: Diagnosis not present

## 2018-06-03 DIAGNOSIS — Z5111 Encounter for antineoplastic chemotherapy: Secondary | ICD-10-CM | POA: Diagnosis not present

## 2018-06-03 LAB — CMP (CANCER CENTER ONLY)
ALT: 25 U/L (ref 0–44)
AST: 15 U/L (ref 15–41)
Albumin: 3.7 g/dL (ref 3.5–5.0)
Alkaline Phosphatase: 53 U/L (ref 38–126)
Anion gap: 8 (ref 5–15)
BUN: 17 mg/dL (ref 8–23)
CO2: 23 mmol/L (ref 22–32)
Calcium: 9.8 mg/dL (ref 8.9–10.3)
Chloride: 110 mmol/L (ref 98–111)
Creatinine: 0.94 mg/dL (ref 0.44–1.00)
GFR, Est AFR Am: >60 mL/min (ref >60)
GFR, Estimated: >60 mL/min (ref >60)
Glucose, Bld: 174 mg/dL — ABNORMAL HIGH (ref 70–99)
Potassium: 3.8 mmol/L (ref 3.5–5.1)
Sodium: 141 mmol/L (ref 135–145)
Total Bilirubin: <0.2 mg/dL — ABNORMAL LOW (ref 0.3–1.2)
Total Protein: 7.2 g/dL (ref 6.5–8.1)

## 2018-06-03 LAB — CBC WITH DIFFERENTIAL (CANCER CENTER ONLY)
Abs Immature Granulocytes: 0.02 10*3/uL (ref 0.00–0.07)
Basophils Absolute: 0 10*3/uL (ref 0.0–0.1)
Basophils Relative: 1 %
Eosinophils Absolute: 0.1 10*3/uL (ref 0.0–0.5)
Eosinophils Relative: 1 %
HCT: 35.8 % — ABNORMAL LOW (ref 36.0–46.0)
Hemoglobin: 11.6 g/dL — ABNORMAL LOW (ref 12.0–15.0)
Immature Granulocytes: 0 %
Lymphocytes Relative: 34 %
Lymphs Abs: 2.1 10*3/uL (ref 0.7–4.0)
MCH: 28.4 pg (ref 26.0–34.0)
MCHC: 32.4 g/dL (ref 30.0–36.0)
MCV: 87.7 fL (ref 80.0–100.0)
Monocytes Absolute: 0.4 10*3/uL (ref 0.1–1.0)
Monocytes Relative: 6 %
Neutro Abs: 3.5 10*3/uL (ref 1.7–7.7)
Neutrophils Relative %: 58 %
Platelet Count: 331 10*3/uL (ref 150–400)
RBC: 4.08 MIL/uL (ref 3.87–5.11)
RDW: 13.4 % (ref 11.5–15.5)
WBC Count: 6 10*3/uL (ref 4.0–10.5)
nRBC: 0 % (ref 0.0–0.2)

## 2018-06-03 MED ORDER — SODIUM CHLORIDE 0.9 % IV SOLN
80.0000 mg/m2 | Freq: Once | INTRAVENOUS | Status: AC
  Administered 2018-06-03: 156 mg via INTRAVENOUS
  Filled 2018-06-03: qty 26

## 2018-06-03 MED ORDER — DEXAMETHASONE SODIUM PHOSPHATE 10 MG/ML IJ SOLN
8.0000 mg | Freq: Once | INTRAMUSCULAR | Status: AC
  Administered 2018-06-03: 8 mg via INTRAVENOUS

## 2018-06-03 MED ORDER — DIPHENHYDRAMINE HCL 50 MG/ML IJ SOLN
50.0000 mg | Freq: Once | INTRAMUSCULAR | Status: AC
  Administered 2018-06-03: 15:00:00 50 mg via INTRAVENOUS

## 2018-06-03 MED ORDER — SODIUM CHLORIDE 0.9 % IV SOLN
Freq: Once | INTRAVENOUS | Status: AC
  Administered 2018-06-03: 15:00:00 via INTRAVENOUS
  Filled 2018-06-03: qty 250

## 2018-06-03 MED ORDER — DEXAMETHASONE SODIUM PHOSPHATE 10 MG/ML IJ SOLN
INTRAMUSCULAR | Status: AC
  Filled 2018-06-03: qty 1

## 2018-06-03 MED ORDER — FAMOTIDINE IN NACL 20-0.9 MG/50ML-% IV SOLN
20.0000 mg | Freq: Once | INTRAVENOUS | Status: AC
  Administered 2018-06-03: 20 mg via INTRAVENOUS

## 2018-06-03 MED ORDER — DIPHENHYDRAMINE HCL 50 MG/ML IJ SOLN
INTRAMUSCULAR | Status: AC
  Filled 2018-06-03: qty 1

## 2018-06-03 MED ORDER — FAMOTIDINE IN NACL 20-0.9 MG/50ML-% IV SOLN
INTRAVENOUS | Status: AC
  Filled 2018-06-03: qty 50

## 2018-06-03 MED ORDER — ACETAMINOPHEN 325 MG PO TABS
ORAL_TABLET | ORAL | Status: AC
  Filled 2018-06-03: qty 2

## 2018-06-03 MED ORDER — ACETAMINOPHEN 325 MG PO TABS
650.0000 mg | ORAL_TABLET | Freq: Once | ORAL | Status: AC
  Administered 2018-06-03: 15:00:00 650 mg via ORAL

## 2018-06-03 MED ORDER — SODIUM CHLORIDE 0.9% FLUSH
10.0000 mL | Freq: Once | INTRAVENOUS | Status: AC
  Administered 2018-06-03: 10 mL
  Filled 2018-06-03: qty 10

## 2018-06-03 MED ORDER — HEPARIN SOD (PORK) LOCK FLUSH 100 UNIT/ML IV SOLN
500.0000 [IU] | Freq: Once | INTRAVENOUS | Status: AC | PRN
  Administered 2018-06-03: 17:00:00 500 [IU]
  Filled 2018-06-03: qty 5

## 2018-06-03 MED ORDER — TRASTUZUMAB CHEMO 150 MG IV SOLR
2.0000 mg/kg | Freq: Once | INTRAVENOUS | Status: AC
  Administered 2018-06-03: 168 mg via INTRAVENOUS
  Filled 2018-06-03: qty 8

## 2018-06-03 MED ORDER — SODIUM CHLORIDE 0.9% FLUSH
10.0000 mL | INTRAVENOUS | Status: DC | PRN
  Administered 2018-06-03: 10 mL
  Filled 2018-06-03: qty 10

## 2018-06-03 NOTE — Patient Instructions (Signed)
[facility] Discharge Instructions for Patients Receiving Chemotherapy  Today you received the following chemotherapy agents: Taxol and other agent: Herceptin  To help prevent nausea and vomiting after your treatment, we encourage you to take your nausea medication as directed by your MD   If you develop nausea and vomiting that is not controlled by your nausea medication, call the clinic.   BELOW ARE SYMPTOMS THAT SHOULD BE REPORTED IMMEDIATELY:  *FEVER GREATER THAN 100.5 F  *CHILLS WITH OR WITHOUT FEVER  NAUSEA AND VOMITING THAT IS NOT CONTROLLED WITH YOUR NAUSEA MEDICATION  *UNUSUAL SHORTNESS OF BREATH  *UNUSUAL BRUISING OR BLEEDING  TENDERNESS IN MOUTH AND THROAT WITH OR WITHOUT PRESENCE OF ULCERS  *URINARY PROBLEMS  *BOWEL PROBLEMS  UNUSUAL RASH Items with * indicate a potential emergency and should be followed up as soon as possible.  Feel free to call the clinic should you have any questions or concerns. The clinic phone number is [phone].  Please show the [place] at check-in to the Emergency Department and triage nurse.  Coronavirus (COVID-19) Are you at risk?  Are you at risk for the Coronavirus (COVID-19)?  To be considered HIGH RISK for Coronavirus (COVID-19), you have to meet the following criteria:  . Traveled to [place], [place], [place], [place] or [place]; or in the [place] to [place], [place], [place], or [place]; and have fever, cough, and shortness of breath within the last 2 weeks of travel OR . Been in close contact with a person diagnosed with COVID-19 within the last 2 weeks and have fever, cough, and shortness of breath . IF YOU DO NOT MEET THESE CRITERIA, YOU ARE CONSIDERED LOW RISK FOR COVID-19.  What to do if you are HIGH RISK for COVID-19?  Marland Kitchen If you are having a medical emergency, call 911. . Seek medical care right away. Before you go to a doctor's office, urgent care or emergency department, call  ahead and tell them about your recent travel, contact with someone diagnosed with COVID-19, and your symptoms. You should receive instructions from your physician's office regarding next steps of care.  . When you arrive at healthcare provider, tell the healthcare staff immediately you have returned from visiting [place], [place], [place], [place] or [place]; or traveled in the [place] to [place], [place], [place], or [place]; in the last two weeks or you have been in close contact with a person diagnosed with COVID-19 in the last 2 weeks.   . Tell the health care staff about your symptoms: fever, cough and shortness of breath. . After you have been seen by a medical provider, you will be either: o Tested for (COVID-19) and discharged home on quarantine except to seek medical care if symptoms worsen, and asked to  - Stay home and avoid contact with others until you get your results (4-5 days)  - Avoid travel on public transportation if possible (such as bus, train, or airplane) or o Sent to the Emergency Department by EMS for evaluation, COVID-19 testing, and possible admission depending on your condition and test results.  What to do if you are LOW RISK for COVID-19?  Reduce your risk of any infection by using the same precautions used for avoiding the common cold or flu:  Marland Kitchen Wash your hands often with soap and warm water for at least 20 seconds.  If soap and water are not readily available, use an alcohol-based hand sanitizer with at least 60%  alcohol.  . If coughing or sneezing, cover your mouth and nose by coughing or sneezing into the elbow areas of your shirt or coat, into a tissue or into your sleeve (not your hands). . Avoid shaking hands with others and consider head nods or verbal greetings only. . Avoid touching your eyes, nose, or mouth with unwashed hands.  . Avoid close contact with people who are sick. . Avoid places or events with large numbers of people in one location,  like concerts or sporting events. . Carefully consider travel plans you have or are making. . If you are planning any travel outside or inside the [place], visit the CDC's Travelers' Health webpage for the latest health notices. . If you have some symptoms but not all symptoms, continue to monitor at home and seek medical attention if your symptoms worsen. . If you are having a medical emergency, call 911.   [place] / e-Visit: [email or web address]         [facility]: [facility]: [phone]                   [facility]: [phone]

## 2018-06-03 NOTE — Patient Instructions (Signed)

## 2018-06-10 ENCOUNTER — Inpatient Hospital Stay: Payer: Medicare Other

## 2018-06-10 ENCOUNTER — Telehealth: Payer: Self-pay | Admitting: *Deleted

## 2018-06-10 ENCOUNTER — Other Ambulatory Visit: Payer: Self-pay

## 2018-06-10 VITALS — BP 148/69 | HR 80 | Temp 98.0°F | Resp 18

## 2018-06-10 DIAGNOSIS — C50411 Malignant neoplasm of upper-outer quadrant of right female breast: Secondary | ICD-10-CM

## 2018-06-10 DIAGNOSIS — Z5111 Encounter for antineoplastic chemotherapy: Secondary | ICD-10-CM | POA: Diagnosis not present

## 2018-06-10 DIAGNOSIS — Z5112 Encounter for antineoplastic immunotherapy: Secondary | ICD-10-CM | POA: Diagnosis not present

## 2018-06-10 DIAGNOSIS — Z170 Estrogen receptor positive status [ER+]: Secondary | ICD-10-CM

## 2018-06-10 DIAGNOSIS — Z95828 Presence of other vascular implants and grafts: Secondary | ICD-10-CM

## 2018-06-10 DIAGNOSIS — Z7982 Long term (current) use of aspirin: Secondary | ICD-10-CM | POA: Diagnosis not present

## 2018-06-10 DIAGNOSIS — H9202 Otalgia, left ear: Secondary | ICD-10-CM | POA: Diagnosis not present

## 2018-06-10 LAB — CBC WITH DIFFERENTIAL (CANCER CENTER ONLY)
Abs Immature Granulocytes: 0.03 10*3/uL (ref 0.00–0.07)
Basophils Absolute: 0 10*3/uL (ref 0.0–0.1)
Basophils Relative: 1 %
Eosinophils Absolute: 0.1 10*3/uL (ref 0.0–0.5)
Eosinophils Relative: 1 %
HCT: 35.2 % — ABNORMAL LOW (ref 36.0–46.0)
Hemoglobin: 11.3 g/dL — ABNORMAL LOW (ref 12.0–15.0)
Immature Granulocytes: 1 %
Lymphocytes Relative: 34 %
Lymphs Abs: 1.9 10*3/uL (ref 0.7–4.0)
MCH: 28.5 pg (ref 26.0–34.0)
MCHC: 32.1 g/dL (ref 30.0–36.0)
MCV: 88.9 fL (ref 80.0–100.0)
Monocytes Absolute: 0.4 10*3/uL (ref 0.1–1.0)
Monocytes Relative: 8 %
Neutro Abs: 3.2 10*3/uL (ref 1.7–7.7)
Neutrophils Relative %: 55 %
Platelet Count: 333 10*3/uL (ref 150–400)
RBC: 3.96 MIL/uL (ref 3.87–5.11)
RDW: 13.7 % (ref 11.5–15.5)
WBC Count: 5.7 10*3/uL (ref 4.0–10.5)
nRBC: 0 % (ref 0.0–0.2)

## 2018-06-10 LAB — CMP (CANCER CENTER ONLY)
ALT: 19 U/L (ref 0–44)
AST: 14 U/L — ABNORMAL LOW (ref 15–41)
Albumin: 3.7 g/dL (ref 3.5–5.0)
Alkaline Phosphatase: 46 U/L (ref 38–126)
Anion gap: 7 (ref 5–15)
BUN: 14 mg/dL (ref 8–23)
CO2: 22 mmol/L (ref 22–32)
Calcium: 9.6 mg/dL (ref 8.9–10.3)
Chloride: 110 mmol/L (ref 98–111)
Creatinine: 0.78 mg/dL (ref 0.44–1.00)
GFR, Est AFR Am: >60 mL/min (ref >60)
GFR, Estimated: >60 mL/min (ref >60)
Glucose, Bld: 116 mg/dL — ABNORMAL HIGH (ref 70–99)
Potassium: 3.6 mmol/L (ref 3.5–5.1)
Sodium: 139 mmol/L (ref 135–145)
Total Bilirubin: <0.2 mg/dL — ABNORMAL LOW (ref 0.3–1.2)
Total Protein: 7.3 g/dL (ref 6.5–8.1)

## 2018-06-10 MED ORDER — FAMOTIDINE IN NACL 20-0.9 MG/50ML-% IV SOLN
20.0000 mg | Freq: Once | INTRAVENOUS | Status: AC
  Administered 2018-06-10: 15:00:00 20 mg via INTRAVENOUS

## 2018-06-10 MED ORDER — SODIUM CHLORIDE 0.9% FLUSH
10.0000 mL | INTRAVENOUS | Status: DC | PRN
  Administered 2018-06-10: 17:00:00 10 mL
  Filled 2018-06-10: qty 10

## 2018-06-10 MED ORDER — FAMOTIDINE IN NACL 20-0.9 MG/50ML-% IV SOLN
INTRAVENOUS | Status: AC
  Filled 2018-06-10: qty 50

## 2018-06-10 MED ORDER — SODIUM CHLORIDE 0.9 % IV SOLN
80.0000 mg/m2 | Freq: Once | INTRAVENOUS | Status: AC
  Administered 2018-06-10: 16:00:00 156 mg via INTRAVENOUS
  Filled 2018-06-10: qty 26

## 2018-06-10 MED ORDER — SODIUM CHLORIDE 0.9 % IV SOLN
Freq: Once | INTRAVENOUS | Status: AC
  Administered 2018-06-10: 14:00:00 via INTRAVENOUS
  Filled 2018-06-10: qty 250

## 2018-06-10 MED ORDER — DIPHENHYDRAMINE HCL 50 MG/ML IJ SOLN
INTRAMUSCULAR | Status: AC
  Filled 2018-06-10: qty 1

## 2018-06-10 MED ORDER — DEXAMETHASONE SODIUM PHOSPHATE 10 MG/ML IJ SOLN
8.0000 mg | Freq: Once | INTRAMUSCULAR | Status: AC
  Administered 2018-06-10: 14:00:00 8 mg via INTRAVENOUS

## 2018-06-10 MED ORDER — DEXAMETHASONE SODIUM PHOSPHATE 10 MG/ML IJ SOLN
INTRAMUSCULAR | Status: AC
  Filled 2018-06-10: qty 1

## 2018-06-10 MED ORDER — DIPHENHYDRAMINE HCL 50 MG/ML IJ SOLN
50.0000 mg | Freq: Once | INTRAMUSCULAR | Status: AC
  Administered 2018-06-10: 14:00:00 50 mg via INTRAVENOUS

## 2018-06-10 MED ORDER — SODIUM CHLORIDE 0.9% FLUSH
10.0000 mL | Freq: Once | INTRAVENOUS | Status: AC
  Administered 2018-06-10: 10 mL
  Filled 2018-06-10: qty 10

## 2018-06-10 MED ORDER — TRASTUZUMAB CHEMO 150 MG IV SOLR
2.0000 mg/kg | Freq: Once | INTRAVENOUS | Status: AC
  Administered 2018-06-10: 15:00:00 168 mg via INTRAVENOUS
  Filled 2018-06-10: qty 8

## 2018-06-10 MED ORDER — ACETAMINOPHEN 325 MG PO TABS
ORAL_TABLET | ORAL | Status: AC
  Filled 2018-06-10: qty 2

## 2018-06-10 MED ORDER — ACETAMINOPHEN 325 MG PO TABS
650.0000 mg | ORAL_TABLET | Freq: Once | ORAL | Status: AC
  Administered 2018-06-10: 14:00:00 650 mg via ORAL

## 2018-06-10 MED ORDER — HEPARIN SOD (PORK) LOCK FLUSH 100 UNIT/ML IV SOLN
500.0000 [IU] | Freq: Once | INTRAVENOUS | Status: AC | PRN
  Administered 2018-06-10: 17:00:00 500 [IU]
  Filled 2018-06-10: qty 5

## 2018-06-10 NOTE — Telephone Encounter (Signed)
Spoke with patient to follow up and see how her treatments are going.  She states she is doing pretty well with no complaints.  Encouraged her to call with any needs or concerns.

## 2018-06-10 NOTE — Patient Instructions (Addendum)
[facility] Discharge Instructions for Patients Receiving Chemotherapy  Today you received the following chemotherapy agents Trastuzumab (HERCEPTIN) & Paclitaxel (TAXOL).  To help prevent nausea and vomiting after your treatment, we encourage you to take your nausea medication as prescribed.   If you develop nausea and vomiting that is not controlled by your nausea medication, call the clinic.   BELOW ARE SYMPTOMS THAT SHOULD BE REPORTED IMMEDIATELY:  *FEVER GREATER THAN 100.5 F  *CHILLS WITH OR WITHOUT FEVER  NAUSEA AND VOMITING THAT IS NOT CONTROLLED WITH YOUR NAUSEA MEDICATION  *UNUSUAL SHORTNESS OF BREATH  *UNUSUAL BRUISING OR BLEEDING  TENDERNESS IN MOUTH AND THROAT WITH OR WITHOUT PRESENCE OF ULCERS  *URINARY PROBLEMS  *BOWEL PROBLEMS  UNUSUAL RASH Items with * indicate a potential emergency and should be followed up as soon as possible.  Feel free to call the clinic should you have any questions or concerns. The clinic phone number is [phone].  Please show the [place] at check-in to the Emergency Department and triage nurse.  Coronavirus (COVID-19) Are you at risk?  Are you at risk for the Coronavirus (COVID-19)?  To be considered HIGH RISK for Coronavirus (COVID-19), you have to meet the following criteria:  . Traveled to [place], [place], [place], [place] or [place]; or in the [place] to [place], [place], [place], or [place]; and have fever, cough, and shortness of breath within the last 2 weeks of travel OR . Been in close contact with a person diagnosed with COVID-19 within the last 2 weeks and have fever, cough, and shortness of breath . IF YOU DO NOT MEET THESE CRITERIA, YOU ARE CONSIDERED LOW RISK FOR COVID-19.  What to do if you are HIGH RISK for COVID-19?  Marland Kitchen If you are having a medical emergency, call 911. . Seek medical care right away. Before you go to a doctor's office, urgent care or emergency department,  call ahead and tell them about your recent travel, contact with someone diagnosed with COVID-19, and your symptoms. You should receive instructions from your physician's office regarding next steps of care.  . When you arrive at healthcare provider, tell the healthcare staff immediately you have returned from visiting [place], [place], [place], [place] or [place]; or traveled in the [place] to [place], [place], [place], or [place]; in the last two weeks or you have been in close contact with a person diagnosed with COVID-19 in the last 2 weeks.   . Tell the health care staff about your symptoms: fever, cough and shortness of breath. . After you have been seen by a medical provider, you will be either: o Tested for (COVID-19) and discharged home on quarantine except to seek medical care if symptoms worsen, and asked to  - Stay home and avoid contact with others until you get your results (4-5 days)  - Avoid travel on public transportation if possible (such as bus, train, or airplane) or o Sent to the Emergency Department by EMS for evaluation, COVID-19 testing, and possible admission depending on your condition and test results.  What to do if you are LOW RISK for COVID-19?  Reduce your risk of any infection by using the same precautions used for avoiding the common cold or flu:  Marland Kitchen Wash your hands often with soap and warm water for at least 20 seconds.  If soap and water are not readily available, use an alcohol-based hand sanitizer with at least 60% alcohol.  Marland Kitchen  If coughing or sneezing, cover your mouth and nose by coughing or sneezing into the elbow areas of your shirt or coat, into a tissue or into your sleeve (not your hands). . Avoid shaking hands with others and consider head nods or verbal greetings only. . Avoid touching your eyes, nose, or mouth with unwashed hands.  . Avoid close contact with people who are sick. . Avoid places or events with large numbers of people in one  location, like concerts or sporting events. . Carefully consider travel plans you have or are making. . If you are planning any travel outside or inside the [place], visit the CDC's Travelers' Health webpage for the latest health notices. . If you have some symptoms but not all symptoms, continue to monitor at home and seek medical attention if your symptoms worsen. . If you are having a medical emergency, call 911.   [place] / e-Visit: [email or web address]         [facility]: [facility]: [phone]                   [facility]: [phone] \

## 2018-06-16 NOTE — Progress Notes (Signed)
Patient Care Team: [name], MD as PCP - General (Internal Medicine) Fanny Skates, MD as Consulting Physician (General Surgery) Nicholas Lose, MD as Consulting Physician (Hematology and Oncology) Gery Pray, MD as Consulting Physician (Radiation Oncology)  DIAGNOSIS:    ICD-10-CM   1. Malignant neoplasm of upper-outer quadrant of right breast in female, estrogen receptor positive ([facility]) C50.411    Z17.0     SUMMARY OF ONCOLOGIC HISTORY:   Malignant neoplasm of upper-outer quadrant of right breast in female, estrogen receptor positive ([facility])   01/27/2018 Mammogram    Screening Mammogram  Possible right breast mass; asymmetry in left breast.    02/10/2018 Initial Diagnosis    Screening mammogram detected right breast mass and asymmetry in the left breast.  The left breast asymmetry resolved.  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%, HER-2 +3+ by IHC, T1BN0 stage Ia clinical stage    03/24/2018 Surgery    Lumpectomy and SLNB: IDC, 1.2cm, grade 2, margins negative, 4 SLN negative, T1cN0, previously ER/PR/HER-2 positive with Ki-67 of 20%    03/24/2018 Cancer Staging    Staging form: Breast, AJCC 8th Edition - Pathologic stage from 03/24/2018: Stage IA (pT1c, pN0, cM0, G2, ER+, PR+, HER2+) - Signed by Gardenia Phlegm, NP on 03/26/2018    04/22/2018 -  Chemotherapy    The patient had trastuzumab (HERCEPTIN) 336 mg in sodium chloride 0.9 % 250 mL chemo infusion, 4 mg/kg = 336 mg, Intravenous,  Once, 2 of 16 cycles Administration: 336 mg (04/22/2018), 168 mg (04/29/2018), 168 mg (05/21/2018), 168 mg (05/06/2018), 168 mg (05/13/2018), 168 mg (05/27/2018), 168 mg (06/03/2018), 168 mg (06/10/2018) PACLitaxel (TAXOL) 156 mg in sodium chloride 0.9 % 250 mL chemo infusion (</= 14m/m2), 80 mg/m2 = 156 mg, Intravenous,  Once, 2 of 3 cycles Administration: 156 mg (04/22/2018),  156 mg (04/29/2018), 156 mg (05/21/2018), 156 mg (05/06/2018), 156 mg (05/13/2018), 156 mg (05/27/2018), 156 mg (06/03/2018), 156 mg (06/10/2018)  for chemotherapy treatment.      CHIEF COMPLIANT: Cycle 9 Taxol Herceptin  INTERVAL HISTORY: Sandra Shelton a 63y.o. with above-mentioned history of right breast cancer who underwent a lumpectomy and presents to the clinic todayfor cycle9ofadjuvant chemotherapy with weekly Taxol and Herceptin. Nail issues Denies neuropathy  REVIEW OF SYSTEMS:   Constitutional: Denies fevers, chills or abnormal weight loss Eyes: Denies blurriness of vision Ears, nose, mouth, throat, and face: Denies mucositis or sore throat Respiratory: Denies cough, dyspnea or wheezes Cardiovascular: Denies palpitation, chest discomfort Gastrointestinal: Denies nausea, heartburn or change in bowel habits Skin: Denies abnormal skin rashes Lymphatics: Denies new lymphadenopathy or easy bruising Neurological: Denies numbness, tingling or new weaknesses Behavioral/Psych: Mood is stable, no new changes  Extremities: No lower extremity edema Breast: denies any pain or lumps or nodules in either breasts All other systems were reviewed with the patient and are negative.  I have reviewed the past medical history, past surgical history, social history and family history with the patient and they are unchanged from previous note.  ALLERGIES:  has No Known Allergies.  MEDICATIONS:  Current Outpatient Medications  Medication Sig Dispense Refill  . amLODipine (NORVASC) 10 MG tablet TAKE 1 TABLET BY MOUTH DAILY. 30 tablet 2  . aspirin 81 MG tablet Take 1 tablet (81 mg total) by mouth daily. 100 tablet 2  . blood glucose meter kit and supplies KIT Dispense based  on patient and insurance preference. Use up to four times daily as directed. (FOR ICD-9 250.00, 250.01). 1 each 0  . clopidogrel (PLAVIX) 75 MG tablet TAKE 1 TABLET BY MOUTH DAILY. 30 tablet 2  . INSULIN SYRINGE .5CC/29G 29G X  1/2" 0.5 ML MISC For insulin injection 100 each 0  . LANTUS SOLOSTAR 100 UNIT/ML Solostar Pen Inject 7 Units into the skin 2 (two) times daily. 15 mL 6  . lidocaine-prilocaine (EMLA) cream APPLY 1 APPLICATION TOPICALLY AS NEEDED. 30 g 0  . lisinopril (PRINIVIL,ZESTRIL) 5 MG tablet Take 1 tablet (5 mg total) by mouth daily. 90 tablet 6  . metFORMIN (GLUCOPHAGE) 500 MG tablet TAKE 1 TABLET BY MOUTH 2 TIMES DAILY WITH A MEAL. 60 tablet 2  . rosuvastatin (CRESTOR) 40 MG tablet Take 40 mg by mouth daily.    . Syringe/Needle, Disp, (SYRINGE 3CC/27GX1-1/4") 27G X 1-1/4" 3 ML MISC For insulin injection 100 each 0   No current facility-administered medications for this visit.     PHYSICAL EXAMINATION: ECOG PERFORMANCE STATUS: 1 - Symptomatic but completely ambulatory  Vitals:   06/17/18 1205  BP: (!) 141/56  Pulse: 86  Resp: 18  Temp: 97.8 F (36.6 C)  SpO2: 100%   Filed Weights   06/17/18 1205  Weight: 187 lb 6.4 oz (85 kg)    Exam not done due to COVID 19 precautions  LABORATORY DATA:  I have reviewed the data as listed CMP Latest Ref Rng & Units 06/10/2018 06/03/2018 05/27/2018  Glucose 70 - 99 mg/dL 116(H) 174(H) 133(H)  BUN 8 - 23 mg/dL 14 17 14   Creatinine 0.44 - 1.00 mg/dL 0.78 0.94 0.83  Sodium 135 - 145 mmol/L 139 141 141  Potassium 3.5 - 5.1 mmol/L 3.6 3.8 3.8  Chloride 98 - 111 mmol/L 110 110 108  CO2 22 - 32 mmol/L 22 23 24   Calcium 8.9 - 10.3 mg/dL 9.6 9.8 9.6  Total Protein 6.5 - 8.1 g/dL 7.3 7.2 7.5  Total Bilirubin 0.3 - 1.2 mg/dL <0.2(L) <0.2(L) <0.2(L)  Alkaline Phos 38 - 126 U/L 46 53 55  AST 15 - 41 U/L 14(L) 15 21  ALT 0 - 44 U/L 19 25 32    Lab Results  Component Value Date   WBC 5.8 06/17/2018   HGB 11.0 (L) 06/17/2018   HCT 33.5 (L) 06/17/2018   MCV 88.4 06/17/2018   PLT 349 06/17/2018   NEUTROABS 3.4 06/17/2018    ASSESSMENT & PLAN:  Malignant neoplasm of upper-outer quadrant of right breast in female, estrogen receptor positive ([facility])  02/04/2018:Screening mammogram detected right breast mass and asymmetry in the left breast. The left breast asymmetry resolved. 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 Adj chemo with Taxol Herceptin weekly X 12 foll by Herceptin Q 3 weeks for 1 yearstarted 04/22/2018 2. Adjuvant radiation therapy followed by 3. Adjuvant antiestrogen therapy ------------------------------------------------------------------------------------------------------------------------ Current treatment: Taxol Herceptin week9  Echocardiogram:04/07/2018: EF 60 to 65% Labs reviewed  Chemo toxicities: Does not have any nausea or vomiting or any other side effects to chemotherapy. Patient denies neuropathy.  Patient complains of left ear pain: Better  Monitoring closely. She stays active and  bakes regularly. Return to clinicevery week for Taxol and every 2 weeks for follow-up with me.    No orders of the defined types were placed in this encounter.  The patient has a good understanding of the overall plan. she agrees with it. she will call with any problems that may develop before the next visit here.  Nicholas Lose, MD 06/17/2018  Julious Oka Dorshimer am acting as scribe for Dr. Nicholas Lose.  I have reviewed the above documentation for accuracy and completeness, and I agree with the above.

## 2018-06-17 ENCOUNTER — Inpatient Hospital Stay: Payer: Medicare Other

## 2018-06-17 ENCOUNTER — Other Ambulatory Visit: Payer: Self-pay

## 2018-06-17 ENCOUNTER — Inpatient Hospital Stay (HOSPITAL_BASED_OUTPATIENT_CLINIC_OR_DEPARTMENT_OTHER): Payer: Medicare Other | Admitting: Hematology and Oncology

## 2018-06-17 ENCOUNTER — Inpatient Hospital Stay: Payer: Medicare Other | Attending: Hematology and Oncology

## 2018-06-17 DIAGNOSIS — Z79899 Other long term (current) drug therapy: Secondary | ICD-10-CM

## 2018-06-17 DIAGNOSIS — Z923 Personal history of irradiation: Secondary | ICD-10-CM

## 2018-06-17 DIAGNOSIS — Z5112 Encounter for antineoplastic immunotherapy: Secondary | ICD-10-CM | POA: Insufficient documentation

## 2018-06-17 DIAGNOSIS — Z9221 Personal history of antineoplastic chemotherapy: Secondary | ICD-10-CM | POA: Insufficient documentation

## 2018-06-17 DIAGNOSIS — Z170 Estrogen receptor positive status [ER+]: Secondary | ICD-10-CM | POA: Diagnosis not present

## 2018-06-17 DIAGNOSIS — C50411 Malignant neoplasm of upper-outer quadrant of right female breast: Secondary | ICD-10-CM | POA: Diagnosis not present

## 2018-06-17 DIAGNOSIS — Z5111 Encounter for antineoplastic chemotherapy: Secondary | ICD-10-CM | POA: Insufficient documentation

## 2018-06-17 DIAGNOSIS — Z7982 Long term (current) use of aspirin: Secondary | ICD-10-CM

## 2018-06-17 DIAGNOSIS — Z794 Long term (current) use of insulin: Secondary | ICD-10-CM | POA: Diagnosis not present

## 2018-06-17 DIAGNOSIS — Z95828 Presence of other vascular implants and grafts: Secondary | ICD-10-CM

## 2018-06-17 LAB — CBC WITH DIFFERENTIAL (CANCER CENTER ONLY)
Abs Immature Granulocytes: 0.04 10*3/uL (ref 0.00–0.07)
Basophils Absolute: 0 10*3/uL (ref 0.0–0.1)
Basophils Relative: 1 %
Eosinophils Absolute: 0.1 10*3/uL (ref 0.0–0.5)
Eosinophils Relative: 1 %
HCT: 33.5 % — ABNORMAL LOW (ref 36.0–46.0)
Hemoglobin: 11 g/dL — ABNORMAL LOW (ref 12.0–15.0)
Immature Granulocytes: 1 %
Lymphocytes Relative: 32 %
Lymphs Abs: 1.8 10*3/uL (ref 0.7–4.0)
MCH: 29 pg (ref 26.0–34.0)
MCHC: 32.8 g/dL (ref 30.0–36.0)
MCV: 88.4 fL (ref 80.0–100.0)
Monocytes Absolute: 0.4 10*3/uL (ref 0.1–1.0)
Monocytes Relative: 7 %
Neutro Abs: 3.4 10*3/uL (ref 1.7–7.7)
Neutrophils Relative %: 58 %
Platelet Count: 349 10*3/uL (ref 150–400)
RBC: 3.79 MIL/uL — ABNORMAL LOW (ref 3.87–5.11)
RDW: 14.3 % (ref 11.5–15.5)
WBC Count: 5.8 10*3/uL (ref 4.0–10.5)
nRBC: 0 % (ref 0.0–0.2)

## 2018-06-17 LAB — CMP (CANCER CENTER ONLY)
ALT: 18 U/L (ref 0–44)
AST: 15 U/L (ref 15–41)
Albumin: 3.7 g/dL (ref 3.5–5.0)
Alkaline Phosphatase: 47 U/L (ref 38–126)
Anion gap: 7 (ref 5–15)
BUN: 15 mg/dL (ref 8–23)
CO2: 23 mmol/L (ref 22–32)
Calcium: 9.6 mg/dL (ref 8.9–10.3)
Chloride: 109 mmol/L (ref 98–111)
Creatinine: 0.77 mg/dL (ref 0.44–1.00)
GFR, Est AFR Am: >60 mL/min (ref >60)
GFR, Estimated: >60 mL/min (ref >60)
Glucose, Bld: 150 mg/dL — ABNORMAL HIGH (ref 70–99)
Potassium: 3.5 mmol/L (ref 3.5–5.1)
Sodium: 139 mmol/L (ref 135–145)
Total Bilirubin: <0.2 mg/dL — ABNORMAL LOW (ref 0.3–1.2)
Total Protein: 7.2 g/dL (ref 6.5–8.1)

## 2018-06-17 MED ORDER — SODIUM CHLORIDE 0.9% FLUSH
10.0000 mL | INTRAVENOUS | Status: DC | PRN
  Administered 2018-06-17: 10 mL
  Filled 2018-06-17: qty 10

## 2018-06-17 MED ORDER — HEPARIN SOD (PORK) LOCK FLUSH 100 UNIT/ML IV SOLN
500.0000 [IU] | Freq: Once | INTRAVENOUS | Status: AC | PRN
  Administered 2018-06-17: 500 [IU]
  Filled 2018-06-17: qty 5

## 2018-06-17 MED ORDER — DIPHENHYDRAMINE HCL 50 MG/ML IJ SOLN
50.0000 mg | Freq: Once | INTRAMUSCULAR | Status: AC
  Administered 2018-06-17: 13:00:00 50 mg via INTRAVENOUS

## 2018-06-17 MED ORDER — DEXAMETHASONE SODIUM PHOSPHATE 10 MG/ML IJ SOLN
8.0000 mg | Freq: Once | INTRAMUSCULAR | Status: AC
  Administered 2018-06-17: 13:00:00 8 mg via INTRAVENOUS

## 2018-06-17 MED ORDER — DIPHENHYDRAMINE HCL 50 MG/ML IJ SOLN
INTRAMUSCULAR | Status: AC
  Filled 2018-06-17: qty 1

## 2018-06-17 MED ORDER — SODIUM CHLORIDE 0.9 % IV SOLN
Freq: Once | INTRAVENOUS | Status: AC
  Administered 2018-06-17: 13:00:00 via INTRAVENOUS
  Filled 2018-06-17: qty 250

## 2018-06-17 MED ORDER — FAMOTIDINE IN NACL 20-0.9 MG/50ML-% IV SOLN
20.0000 mg | Freq: Once | INTRAVENOUS | Status: AC
  Administered 2018-06-17: 20 mg via INTRAVENOUS

## 2018-06-17 MED ORDER — SODIUM CHLORIDE 0.9 % IV SOLN
80.0000 mg/m2 | Freq: Once | INTRAVENOUS | Status: AC
  Administered 2018-06-17: 15:00:00 156 mg via INTRAVENOUS
  Filled 2018-06-17: qty 26

## 2018-06-17 MED ORDER — TRASTUZUMAB CHEMO 150 MG IV SOLR
2.0000 mg/kg | Freq: Once | INTRAVENOUS | Status: AC
  Administered 2018-06-17: 168 mg via INTRAVENOUS
  Filled 2018-06-17: qty 8

## 2018-06-17 MED ORDER — DEXAMETHASONE SODIUM PHOSPHATE 10 MG/ML IJ SOLN
INTRAMUSCULAR | Status: AC
  Filled 2018-06-17: qty 1

## 2018-06-17 MED ORDER — ACETAMINOPHEN 325 MG PO TABS
ORAL_TABLET | ORAL | Status: AC
  Filled 2018-06-17: qty 2

## 2018-06-17 MED ORDER — ACETAMINOPHEN 325 MG PO TABS
650.0000 mg | ORAL_TABLET | Freq: Once | ORAL | Status: AC
  Administered 2018-06-17: 650 mg via ORAL

## 2018-06-17 MED ORDER — SODIUM CHLORIDE 0.9% FLUSH
10.0000 mL | Freq: Once | INTRAVENOUS | Status: AC
  Administered 2018-06-17: 10 mL
  Filled 2018-06-17: qty 10

## 2018-06-17 MED ORDER — FAMOTIDINE IN NACL 20-0.9 MG/50ML-% IV SOLN
INTRAVENOUS | Status: AC
  Filled 2018-06-17: qty 50

## 2018-06-17 NOTE — Patient Instructions (Signed)

## 2018-06-17 NOTE — Assessment & Plan Note (Signed)
02/04/2018:Screening mammogram detected right breast mass and asymmetry in the left breast. The left breast asymmetry resolved. 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 Adj chemo with Taxol Herceptin weekly X 12 foll by Herceptin Q 3 weeks for 1 yearstarted 04/22/2018 2. Adjuvant radiation therapy followed by 3. Adjuvant antiestrogen therapy ------------------------------------------------------------------------------------------------------------------------ Current treatment: Taxol Herceptin week7  Echocardiogram:04/07/2018: EF 60 to 65% Labs reviewed  Chemo toxicities: Does not have any nausea or vomiting or any other side effects to chemotherapy. Patient denies neuropathy.  Patient complains of left ear pain:   Monitoring closely. She stays active and bakes regularly. Return to clinicevery week for Taxol and every 2 weeks for follow-up with me.

## 2018-06-17 NOTE — Patient Instructions (Signed)
[facility] Discharge Instructions for Patients Receiving Chemotherapy  Today you received the following chemotherapy agents:  Herceptin and Taxol.  To help prevent nausea and vomiting after your treatment, we encourage you to take your nausea medication as directed.   If you develop nausea and vomiting that is not controlled by your nausea medication, call the clinic.   BELOW ARE SYMPTOMS THAT SHOULD BE REPORTED IMMEDIATELY:  *FEVER GREATER THAN 100.5 F  *CHILLS WITH OR WITHOUT FEVER  NAUSEA AND VOMITING THAT IS NOT CONTROLLED WITH YOUR NAUSEA MEDICATION  *UNUSUAL SHORTNESS OF BREATH  *UNUSUAL BRUISING OR BLEEDING  TENDERNESS IN MOUTH AND THROAT WITH OR WITHOUT PRESENCE OF ULCERS  *URINARY PROBLEMS  *BOWEL PROBLEMS  UNUSUAL RASH Items with * indicate a potential emergency and should be followed up as soon as possible.  Feel free to call the clinic should you have any questions or concerns. The clinic phone number is [phone].  Please show the CHEMO ALERT CARD at check-in to the Emergency Department and triage nurse.   

## 2018-06-17 NOTE — Progress Notes (Signed)
PT skin was irritated so we use OPSITE dressing.Marland Kitchen

## 2018-06-18 MED FILL — LANTUS SOLOSTAR 100 UNITS/M: 100 | 90 days supply | Qty: 18 | Fill #5

## 2018-06-19 ENCOUNTER — Other Ambulatory Visit: Payer: Self-pay

## 2018-06-19 DIAGNOSIS — E1159 Type 2 diabetes mellitus with other circulatory complications: Secondary | ICD-10-CM

## 2018-06-19 MED ORDER — INSULIN PEN NEEDLE 32G X 4 MM MISC: 3 refills | Status: DC

## 2018-06-24 ENCOUNTER — Inpatient Hospital Stay: Payer: Medicare Other

## 2018-06-24 ENCOUNTER — Other Ambulatory Visit: Payer: Self-pay

## 2018-06-24 ENCOUNTER — Telehealth: Payer: Self-pay | Admitting: *Deleted

## 2018-06-24 VITALS — BP 150/67 | HR 79 | Temp 98.9°F | Resp 18

## 2018-06-24 DIAGNOSIS — C50411 Malignant neoplasm of upper-outer quadrant of right female breast: Secondary | ICD-10-CM

## 2018-06-24 DIAGNOSIS — Z5111 Encounter for antineoplastic chemotherapy: Secondary | ICD-10-CM | POA: Diagnosis not present

## 2018-06-24 DIAGNOSIS — Z170 Estrogen receptor positive status [ER+]: Secondary | ICD-10-CM | POA: Diagnosis not present

## 2018-06-24 DIAGNOSIS — Z923 Personal history of irradiation: Secondary | ICD-10-CM | POA: Diagnosis not present

## 2018-06-24 DIAGNOSIS — Z95828 Presence of other vascular implants and grafts: Secondary | ICD-10-CM

## 2018-06-24 DIAGNOSIS — Z9221 Personal history of antineoplastic chemotherapy: Secondary | ICD-10-CM | POA: Diagnosis not present

## 2018-06-24 DIAGNOSIS — Z5112 Encounter for antineoplastic immunotherapy: Secondary | ICD-10-CM | POA: Diagnosis not present

## 2018-06-24 LAB — CBC WITH DIFFERENTIAL (CANCER CENTER ONLY)
Abs Immature Granulocytes: 0.03 10*3/uL (ref 0.00–0.07)
Basophils Absolute: 0.1 10*3/uL (ref 0.0–0.1)
Basophils Relative: 1 %
Eosinophils Absolute: 0.1 10*3/uL (ref 0.0–0.5)
Eosinophils Relative: 1 %
HCT: 33.1 % — ABNORMAL LOW (ref 36.0–46.0)
Hemoglobin: 10.6 g/dL — ABNORMAL LOW (ref 12.0–15.0)
Immature Granulocytes: 1 %
Lymphocytes Relative: 27 %
Lymphs Abs: 1.6 10*3/uL (ref 0.7–4.0)
MCH: 28.8 pg (ref 26.0–34.0)
MCHC: 32 g/dL (ref 30.0–36.0)
MCV: 89.9 fL (ref 80.0–100.0)
Monocytes Absolute: 0.5 10*3/uL (ref 0.1–1.0)
Monocytes Relative: 8 %
Neutro Abs: 3.7 10*3/uL (ref 1.7–7.7)
Neutrophils Relative %: 62 %
Platelet Count: 324 10*3/uL (ref 150–400)
RBC: 3.68 MIL/uL — ABNORMAL LOW (ref 3.87–5.11)
RDW: 14.4 % (ref 11.5–15.5)
WBC Count: 5.9 10*3/uL (ref 4.0–10.5)
nRBC: 0 % (ref 0.0–0.2)

## 2018-06-24 LAB — CMP (CANCER CENTER ONLY)
ALT: 14 U/L (ref 0–44)
AST: 16 U/L (ref 15–41)
Albumin: 3.6 g/dL (ref 3.5–5.0)
Alkaline Phosphatase: 41 U/L (ref 38–126)
Anion gap: 8 (ref 5–15)
BUN: 16 mg/dL (ref 8–23)
CO2: 22 mmol/L (ref 22–32)
Calcium: 9.9 mg/dL (ref 8.9–10.3)
Chloride: 111 mmol/L (ref 98–111)
Creatinine: 0.88 mg/dL (ref 0.44–1.00)
GFR, Est AFR Am: >60 mL/min (ref >60)
GFR, Estimated: >60 mL/min (ref >60)
Glucose, Bld: 161 mg/dL — ABNORMAL HIGH (ref 70–99)
Potassium: 4 mmol/L (ref 3.5–5.1)
Sodium: 141 mmol/L (ref 135–145)
Total Bilirubin: <0.2 mg/dL — ABNORMAL LOW (ref 0.3–1.2)
Total Protein: 7.2 g/dL (ref 6.5–8.1)

## 2018-06-24 MED ORDER — DIPHENHYDRAMINE HCL 50 MG/ML IJ SOLN
INTRAMUSCULAR | Status: AC
  Filled 2018-06-24: qty 1

## 2018-06-24 MED ORDER — SODIUM CHLORIDE 0.9 % IV SOLN
Freq: Once | INTRAVENOUS | Status: AC
  Administered 2018-06-24: 14:00:00 via INTRAVENOUS
  Filled 2018-06-24: qty 250

## 2018-06-24 MED ORDER — SODIUM CHLORIDE 0.9% FLUSH
10.0000 mL | Freq: Once | INTRAVENOUS | Status: AC
  Administered 2018-06-24: 10 mL
  Filled 2018-06-24: qty 10

## 2018-06-24 MED ORDER — FAMOTIDINE IN NACL 20-0.9 MG/50ML-% IV SOLN
20.0000 mg | Freq: Once | INTRAVENOUS | Status: AC
  Administered 2018-06-24: 14:00:00 20 mg via INTRAVENOUS

## 2018-06-24 MED ORDER — ACETAMINOPHEN 325 MG PO TABS
ORAL_TABLET | ORAL | Status: AC
  Filled 2018-06-24: qty 2

## 2018-06-24 MED ORDER — DEXAMETHASONE SODIUM PHOSPHATE 10 MG/ML IJ SOLN
8.0000 mg | Freq: Once | INTRAMUSCULAR | Status: AC
  Administered 2018-06-24: 8 mg via INTRAVENOUS

## 2018-06-24 MED ORDER — FAMOTIDINE IN NACL 20-0.9 MG/50ML-% IV SOLN
INTRAVENOUS | Status: AC
  Filled 2018-06-24: qty 50

## 2018-06-24 MED ORDER — DEXAMETHASONE SODIUM PHOSPHATE 10 MG/ML IJ SOLN
INTRAMUSCULAR | Status: AC
  Filled 2018-06-24: qty 1

## 2018-06-24 MED ORDER — SODIUM CHLORIDE 0.9% FLUSH
10.0000 mL | INTRAVENOUS | Status: DC | PRN
  Administered 2018-06-24: 10 mL
  Filled 2018-06-24: qty 10

## 2018-06-24 MED ORDER — DIPHENHYDRAMINE HCL 50 MG/ML IJ SOLN
50.0000 mg | Freq: Once | INTRAMUSCULAR | Status: AC
  Administered 2018-06-24: 14:00:00 50 mg via INTRAVENOUS

## 2018-06-24 MED ORDER — TRASTUZUMAB CHEMO 150 MG IV SOLR
2.0000 mg/kg | Freq: Once | INTRAVENOUS | Status: AC
  Administered 2018-06-24: 15:00:00 168 mg via INTRAVENOUS
  Filled 2018-06-24: qty 8

## 2018-06-24 MED ORDER — ACETAMINOPHEN 325 MG PO TABS
650.0000 mg | ORAL_TABLET | Freq: Once | ORAL | Status: AC
  Administered 2018-06-24: 14:00:00 650 mg via ORAL

## 2018-06-24 MED ORDER — SODIUM CHLORIDE 0.9 % IV SOLN
80.0000 mg/m2 | Freq: Once | INTRAVENOUS | Status: AC
  Administered 2018-06-24: 156 mg via INTRAVENOUS
  Filled 2018-06-24: qty 26

## 2018-06-24 MED ORDER — HEPARIN SOD (PORK) LOCK FLUSH 100 UNIT/ML IV SOLN
500.0000 [IU] | Freq: Once | INTRAVENOUS | Status: AC | PRN
  Administered 2018-06-24: 17:00:00 500 [IU]
  Filled 2018-06-24: qty 5

## 2018-06-24 NOTE — Telephone Encounter (Signed)
Finishing note from previous encounter.   Patient's numbness and tingling in her hands and feet have continued.  She denies dropping things or having trouble buttoning.  MD notified

## 2018-06-24 NOTE — Telephone Encounter (Signed)
Spoke with patient this am to follow up during her treatment.  She states she started having some numbness and tingling in her hands and feet last Wed/THurs and ha

## 2018-06-24 NOTE — Patient Instructions (Signed)
[facility] Discharge Instructions for Patients Receiving Chemotherapy  Today you received the following chemotherapy agents:  Herceptin and Taxol.  To help prevent nausea and vomiting after your treatment, we encourage you to take your nausea medication as directed.   If you develop nausea and vomiting that is not controlled by your nausea medication, call the clinic.   BELOW ARE SYMPTOMS THAT SHOULD BE REPORTED IMMEDIATELY:  *FEVER GREATER THAN 100.5 F  *CHILLS WITH OR WITHOUT FEVER  NAUSEA AND VOMITING THAT IS NOT CONTROLLED WITH YOUR NAUSEA MEDICATION  *UNUSUAL SHORTNESS OF BREATH  *UNUSUAL BRUISING OR BLEEDING  TENDERNESS IN MOUTH AND THROAT WITH OR WITHOUT PRESENCE OF ULCERS  *URINARY PROBLEMS  *BOWEL PROBLEMS  UNUSUAL RASH Items with * indicate a potential emergency and should be followed up as soon as possible.  Feel free to call the clinic should you have any questions or concerns. The clinic phone number is [phone].  Please show the CHEMO ALERT CARD at check-in to the Emergency Department and triage nurse.   

## 2018-06-24 NOTE — Progress Notes (Signed)
Dr. Alvy Bimler (oncall MD) notified of patient's numbness and tingling in fingers and toes. OK to tx today. Dr. Lindi Adie can address dose reduction of Taxol with next visit.

## 2018-06-24 NOTE — Assessment & Plan Note (Signed)
02/04/2018:Screening mammogram detected right breast mass and asymmetry in the left breast. The left breast asymmetry resolved. 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 Adj chemo with Taxol Herceptin weekly X 12 foll by Herceptin Q 3 weeks for 1 yearstarted 04/22/2018 2. Adjuvant radiation therapy followed by 3. Adjuvant antiestrogen therapy ------------------------------------------------------------------------------------------------------------------------ Current treatment: Taxol Herceptin week11  Echocardiogram:04/07/2018: EF 60 to 65% Labs reviewed  Chemo toxicities: Does not have any nausea or vomiting or any other side effects to chemotherapy. Patient denies neuropathy.  Patient complains of left ear pain: Better  Monitoring closely. She stays active andbakesregularly. Patient completes cycle 12 next week.  I will refer her to radiation oncology for adjuvant radiation. Return to clinic at the end of radiation for antiestrogen therapy.

## 2018-06-24 NOTE — Patient Instructions (Signed)

## 2018-06-26 ENCOUNTER — Other Ambulatory Visit: Payer: Self-pay | Admitting: General Surgery

## 2018-06-26 DIAGNOSIS — Z8673 Personal history of transient ischemic attack (TIA), and cerebral infarction without residual deficits: Secondary | ICD-10-CM | POA: Diagnosis not present

## 2018-06-26 DIAGNOSIS — N6311 Unspecified lump in the right breast, upper outer quadrant: Secondary | ICD-10-CM | POA: Diagnosis not present

## 2018-06-26 DIAGNOSIS — C50411 Malignant neoplasm of upper-outer quadrant of right female breast: Secondary | ICD-10-CM | POA: Diagnosis not present

## 2018-06-26 DIAGNOSIS — Z794 Long term (current) use of insulin: Secondary | ICD-10-CM | POA: Diagnosis not present

## 2018-06-26 DIAGNOSIS — Z9889 Other specified postprocedural states: Secondary | ICD-10-CM | POA: Diagnosis not present

## 2018-06-26 DIAGNOSIS — Z7901 Long term (current) use of anticoagulants: Secondary | ICD-10-CM | POA: Diagnosis not present

## 2018-06-26 DIAGNOSIS — N6310 Unspecified lump in the right breast, unspecified quadrant: Secondary | ICD-10-CM

## 2018-06-26 DIAGNOSIS — E119 Type 2 diabetes mellitus without complications: Secondary | ICD-10-CM | POA: Diagnosis not present

## 2018-06-27 ENCOUNTER — Other Ambulatory Visit: Payer: Self-pay

## 2018-06-27 ENCOUNTER — Ambulatory Visit
Admission: RE | Admit: 2018-06-27 | Discharge: 2018-06-27 | Disposition: A | Discharge status: Home / Self Care | Payer: Medicare Other | Source: Ambulatory Visit | Attending: General Surgery | Admitting: General Surgery

## 2018-06-27 ENCOUNTER — Other Ambulatory Visit: Payer: Self-pay | Admitting: General Surgery

## 2018-06-27 DIAGNOSIS — N6310 Unspecified lump in the right breast, unspecified quadrant: Secondary | ICD-10-CM

## 2018-06-27 DIAGNOSIS — R928 Other abnormal and inconclusive findings on diagnostic imaging of breast: Secondary | ICD-10-CM | POA: Diagnosis not present

## 2018-06-27 DIAGNOSIS — D0511 Intraductal carcinoma in situ of right breast: Secondary | ICD-10-CM | POA: Diagnosis not present

## 2018-06-30 NOTE — Progress Notes (Signed)
Patient Care Team: Sandra Pier, MD as PCP - General (Internal Medicine) Sandra Skates, MD as Consulting Physician (General Surgery) Sandra Lose, MD as Consulting Physician (Hematology and Oncology) Sandra Pray, MD as Consulting Physician (Radiation Oncology)  DIAGNOSIS:    ICD-10-CM   1. Malignant neoplasm of upper-outer quadrant of right breast in female, estrogen receptor positive (Sandra Shelton)  C50.411    Z17.0     SUMMARY OF ONCOLOGIC HISTORY: Oncology History  Malignant neoplasm of upper-outer quadrant of right breast in female, estrogen receptor positive (Sandra Shelton)  01/27/2018 Mammogram   Screening Mammogram  Possible right breast mass; asymmetry in left breast.   02/10/2018 Initial Diagnosis   Screening mammogram detected right breast mass and asymmetry in the left breast.  The left breast asymmetry resolved.  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%, HER-2 +3+ by IHC, T1BN0 stage Ia clinical stage   03/24/2018 Surgery   Lumpectomy and SLNB: IDC, 1.2cm, grade 2, margins negative, 4 SLN negative, T1cN0, previously ER/PR/HER-2 positive with Ki-67 of 20%   03/24/2018 Cancer Staging   Staging form: Breast, AJCC 8th Edition - Pathologic stage from 03/24/2018: Stage IA (pT1c, pN0, cM0, G2, ER+, PR+, HER2+) - Signed by Sandra Phlegm, NP on 03/26/2018   04/22/2018 -  Chemotherapy   The patient had trastuzumab (HERCEPTIN) 336 mg in sodium chloride 0.9 % 250 mL chemo infusion, 4 mg/kg = 336 mg, Intravenous,  Once, 3 of 16 cycles Administration: 336 mg (04/22/2018), 168 mg (04/29/2018), 168 mg (05/21/2018), 168 mg (05/06/2018), 168 mg (05/13/2018), 168 mg (05/27/2018), 168 mg (06/03/2018), 168 mg (06/10/2018), 168 mg (06/17/2018), 168 mg (06/24/2018) PACLitaxel (TAXOL) 156 mg in sodium chloride 0.9 % 250 mL chemo infusion (</= 34m/m2), 80 mg/m2 = 156 mg, Intravenous,  Once, 3 of 3  cycles Administration: 156 mg (04/22/2018), 156 mg (04/29/2018), 156 mg (05/21/2018), 156 mg (05/06/2018), 156 mg (05/13/2018), 156 mg (05/27/2018), 156 mg (06/03/2018), 156 mg (06/10/2018), 156 mg (06/17/2018), 156 mg (06/24/2018)  for chemotherapy treatment.      CHIEF COMPLIANT: Cycle 11 Taxol Herceptin  INTERVAL HISTORY: Sandra Briddellis a 63y.o. with above-mentioned history of right breast cancer who underwent a lumpectomy. Mammogram on 06/27/18 following a palpable lump showed benign seroma in the right breast and no evidence of malignancy. She presents to the clinic todayfor cycle11ofadjuvant chemotherapy with weekly Taxol and Herceptin.   REVIEW OF SYSTEMS:   Constitutional: Denies fevers, chills or abnormal weight loss Eyes: Denies blurriness of vision Ears, nose, mouth, throat, and face: Denies mucositis or sore throat Respiratory: Denies cough, dyspnea or wheezes Cardiovascular: Denies palpitation, chest discomfort Gastrointestinal: Denies nausea, heartburn or change in bowel habits Skin: Denies abnormal skin rashes Lymphatics: Denies new lymphadenopathy or easy bruising Neurological: Denies numbness, tingling or new weaknesses Behavioral/Psych: Mood is stable, no new changes  Extremities: No lower extremity edema Breast: denies any pain or lumps or nodules in either breasts All other systems were reviewed with the patient and are negative.  I have reviewed the past medical history, past surgical history, social history and family history with the patient and they are unchanged from previous note.  ALLERGIES:  has No Known Allergies.  MEDICATIONS:  Current Outpatient Medications  Medication Sig Dispense Refill  . amLODipine (NORVASC) 10 MG tablet TAKE 1 TABLET BY MOUTH DAILY. 30 tablet 2  . aspirin 81 MG tablet Take 1 tablet (  81 mg total) by mouth daily. 100 tablet 2  . blood glucose meter kit and supplies KIT Dispense based on patient and insurance preference. Use up to four times  daily as directed. (FOR ICD-9 250.00, 250.01). 1 each 0  . clopidogrel (PLAVIX) 75 MG tablet TAKE 1 TABLET BY MOUTH DAILY. 30 tablet 2  . Insulin Pen Needle (TRUEPLUS PEN NEEDLES) 32G X 4 MM MISC Use as directed to inject insulin 100 each 3  . LANTUS SOLOSTAR 100 UNIT/ML Solostar Pen Inject 7 Units into the skin 2 (two) times daily. 15 mL 6  . lidocaine-prilocaine (EMLA) cream APPLY 1 APPLICATION TOPICALLY AS NEEDED. 30 g 0  . lisinopril (PRINIVIL,ZESTRIL) 5 MG tablet Take 1 tablet (5 mg total) by mouth daily. 90 tablet 6  . metFORMIN (GLUCOPHAGE) 500 MG tablet TAKE 1 TABLET BY MOUTH 2 TIMES DAILY WITH A MEAL. 60 tablet 2  . rosuvastatin (CRESTOR) 40 MG tablet Take 40 mg by mouth daily.     No current facility-administered medications for this visit.     PHYSICAL EXAMINATION: ECOG PERFORMANCE STATUS: 1 - Symptomatic but completely ambulatory  Vitals:   07/01/18 1057  BP: (!) 137/58  Pulse: 89  Resp: 18  Temp: 98.5 F (36.9 C)  SpO2: 100%   Filed Weights   07/01/18 1057  Weight: 190 lb 9.6 oz (86.5 kg)    GENERAL: alert, no distress and comfortable SKIN: skin color, texture, turgor are normal, no rashes or significant lesions EYES: normal, Conjunctiva are pink and non-injected, sclera clear OROPHARYNX: no exudate, no erythema and lips, buccal mucosa, and tongue normal  NECK: supple, thyroid normal size, non-tender, without nodularity LYMPH: no palpable lymphadenopathy in the cervical, axillary or inguinal LUNGS: clear to auscultation and percussion with normal breathing effort HEART: regular rate & rhythm and no murmurs and no lower extremity edema ABDOMEN: abdomen soft, non-tender and normal bowel sounds MUSCULOSKELETAL: no cyanosis of digits and no clubbing  NEURO: alert & oriented x 3 with fluent speech, no focal motor/sensory deficits EXTREMITIES: No lower extremity edema  LABORATORY DATA:  I have reviewed the data as listed CMP Latest Ref Rng & Units 06/24/2018 06/17/2018  06/10/2018  Glucose 70 - 99 mg/dL 161(H) 150(H) 116(H)  BUN 8 - 23 mg/dL _0 Creatinine 0.44 - 1.00 mg/dL 0.88 0.77 0.78  Sodium 135 - 145 mmol/L 141 139 139  Potassium 3.5 - 5.1 mmol/L 4.0 3.5 3.6  Chloride 98 - 111 mmol/L 111 109 110  CO2 22 - 32 mmol/L _1 Calcium 8.9 - 10.3 mg/dL 9.9 9.6 9.6  Total Protein 6.5 - 8.1 g/dL 7.2 7.2 7.3  Total Bilirubin 0.3 - 1.2 mg/dL <0.2(L) <0.2(L) <0.2(L)  Alkaline Phos 38 - 126 U/L 41 47 46  AST 15 - 41 U/L 16 15 14(L)  ALT 0 - 44 U/L _2 Lab Results  Component Value Date   WBC 5.9 06/24/2018   HGB 10.6 (L) 06/24/2018   HCT 33.1 (L) 06/24/2018   MCV 89.9 06/24/2018   PLT 324 06/24/2018   NEUTROABS 3.7 06/24/2018    ASSESSMENT & PLAN:  Malignant neoplasm of upper-outer quadrant of right breast in female, estrogen receptor positive ([facility]) 02/04/2018:Screening mammogram detected right breast mass and asymmetry in the left breast. The left breast asymmetry resolved. 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%,  HER-2 +3+ by IHC, T1BN0 stage Ia clinical stage  03/24/2018: Rt BCS: Grade 2 IDC 1.2 cm ER 100%, PR 70%, Ki-67 20%, HER-2 +3+ by IHC, T1BN0 stage Ia Left BCS: DCIS  Treatment plan: 1. Adj chemo with Taxol Herceptin weekly X 12 foll by Herceptin Q 3 weeks for 1 yearstarted 04/22/2018 2. Adjuvant radiation therapy followed by 3. Adjuvant antiestrogen therapy ------------------------------------------------------------------------------------------------------------------------ Current treatment: Taxol Herceptin week11, Taxol being discontinued and she will now get Herceptin every 3 weeks.  Echocardiogram:04/07/2018: EF 60 to 65% Labs reviewed  Chemo toxicities: Does not have any nausea or vomiting or any other side effects to chemotherapy. Severe peripheral neuropathy: It came on very suddenly and  gotten much worse to the point that she is having trouble buttoning her shirt.  Monitoring closely. She stays active andbakesregularly.  Because of worsening peripheral neuropathy, I decided to discontinue Taxol. I change her treatment regimen to every 3-week Herceptin. I will see her back once every 6 weeks with labs. I sent a referral to radiation oncology.   No orders of the defined types were placed in this encounter.  The patient has a good understanding of the overall plan. she agrees with it. she will call with any problems that may develop before the next visit here.  Sandra Lose, MD 07/01/2018  Sandra Shelton am acting as scribe for Dr. Nicholas Shelton.  I have reviewed the above documentation for accuracy and completeness, and I agree with the above.

## 2018-07-01 ENCOUNTER — Other Ambulatory Visit: Payer: Self-pay

## 2018-07-01 ENCOUNTER — Inpatient Hospital Stay (HOSPITAL_BASED_OUTPATIENT_CLINIC_OR_DEPARTMENT_OTHER): Payer: Medicare Other | Admitting: Hematology and Oncology

## 2018-07-01 ENCOUNTER — Inpatient Hospital Stay: Payer: Medicare Other

## 2018-07-01 ENCOUNTER — Encounter: Payer: Self-pay | Admitting: *Deleted

## 2018-07-01 VITALS — BP 137/58 | HR 89 | Temp 98.5°F | Resp 18 | Ht 65.0 in | Wt 190.6 lb

## 2018-07-01 DIAGNOSIS — Z79899 Other long term (current) drug therapy: Secondary | ICD-10-CM

## 2018-07-01 DIAGNOSIS — C50411 Malignant neoplasm of upper-outer quadrant of right female breast: Secondary | ICD-10-CM

## 2018-07-01 DIAGNOSIS — Z923 Personal history of irradiation: Secondary | ICD-10-CM | POA: Diagnosis not present

## 2018-07-01 DIAGNOSIS — Z170 Estrogen receptor positive status [ER+]: Secondary | ICD-10-CM

## 2018-07-01 DIAGNOSIS — Z794 Long term (current) use of insulin: Secondary | ICD-10-CM

## 2018-07-01 DIAGNOSIS — Z5112 Encounter for antineoplastic immunotherapy: Secondary | ICD-10-CM | POA: Diagnosis not present

## 2018-07-01 DIAGNOSIS — Z7982 Long term (current) use of aspirin: Secondary | ICD-10-CM | POA: Diagnosis not present

## 2018-07-01 DIAGNOSIS — C50111 Malignant neoplasm of central portion of right female breast: Secondary | ICD-10-CM

## 2018-07-01 DIAGNOSIS — Z9221 Personal history of antineoplastic chemotherapy: Secondary | ICD-10-CM

## 2018-07-01 DIAGNOSIS — Z5111 Encounter for antineoplastic chemotherapy: Secondary | ICD-10-CM | POA: Diagnosis not present

## 2018-07-01 DIAGNOSIS — Z95828 Presence of other vascular implants and grafts: Secondary | ICD-10-CM

## 2018-07-01 LAB — CMP (CANCER CENTER ONLY)
ALT: 19 U/L (ref 0–44)
AST: 15 U/L (ref 15–41)
Albumin: 3.7 g/dL (ref 3.5–5.0)
Alkaline Phosphatase: 49 U/L (ref 38–126)
Anion gap: 10 (ref 5–15)
BUN: 14 mg/dL (ref 8–23)
CO2: 23 mmol/L (ref 22–32)
Calcium: 9.6 mg/dL (ref 8.9–10.3)
Chloride: 106 mmol/L (ref 98–111)
Creatinine: 0.86 mg/dL (ref 0.44–1.00)
GFR, Est AFR Am: >60 mL/min (ref >60)
GFR, Estimated: >60 mL/min (ref >60)
Glucose, Bld: 162 mg/dL — ABNORMAL HIGH (ref 70–99)
Potassium: 3.9 mmol/L (ref 3.5–5.1)
Sodium: 139 mmol/L (ref 135–145)
Total Bilirubin: <0.2 mg/dL — ABNORMAL LOW (ref 0.3–1.2)
Total Protein: 7.4 g/dL (ref 6.5–8.1)

## 2018-07-01 LAB — CBC WITH DIFFERENTIAL (CANCER CENTER ONLY)
Abs Immature Granulocytes: 0.03 10*3/uL (ref 0.00–0.07)
Basophils Absolute: 0 10*3/uL (ref 0.0–0.1)
Basophils Relative: 1 %
Eosinophils Absolute: 0.1 10*3/uL (ref 0.0–0.5)
Eosinophils Relative: 1 %
HCT: 33.4 % — ABNORMAL LOW (ref 36.0–46.0)
Hemoglobin: 10.8 g/dL — ABNORMAL LOW (ref 12.0–15.0)
Immature Granulocytes: 1 %
Lymphocytes Relative: 25 %
Lymphs Abs: 1.6 10*3/uL (ref 0.7–4.0)
MCH: 28.6 pg (ref 26.0–34.0)
MCHC: 32.3 g/dL (ref 30.0–36.0)
MCV: 88.6 fL (ref 80.0–100.0)
Monocytes Absolute: 0.5 10*3/uL (ref 0.1–1.0)
Monocytes Relative: 8 %
Neutro Abs: 4.1 10*3/uL (ref 1.7–7.7)
Neutrophils Relative %: 64 %
Platelet Count: 331 10*3/uL (ref 150–400)
RBC: 3.77 MIL/uL — ABNORMAL LOW (ref 3.87–5.11)
RDW: 14.3 % (ref 11.5–15.5)
WBC Count: 6.3 10*3/uL (ref 4.0–10.5)
nRBC: 0 % (ref 0.0–0.2)

## 2018-07-01 MED ORDER — DIPHENHYDRAMINE HCL 25 MG PO CAPS
50.0000 mg | ORAL_CAPSULE | Freq: Once | ORAL | Status: AC
  Administered 2018-07-01: 50 mg via ORAL

## 2018-07-01 MED ORDER — SODIUM CHLORIDE 0.9 % IV SOLN
Freq: Once | INTRAVENOUS | Status: AC
  Administered 2018-07-01: 12:00:00 via INTRAVENOUS
  Filled 2018-07-01: qty 250

## 2018-07-01 MED ORDER — SODIUM CHLORIDE 0.9% FLUSH
10.0000 mL | INTRAVENOUS | Status: DC | PRN
  Administered 2018-07-01: 10 mL
  Filled 2018-07-01: qty 10

## 2018-07-01 MED ORDER — DIPHENHYDRAMINE HCL 25 MG PO CAPS
ORAL_CAPSULE | ORAL | Status: AC
  Filled 2018-07-01: qty 2

## 2018-07-01 MED ORDER — ACETAMINOPHEN 325 MG PO TABS
ORAL_TABLET | ORAL | Status: AC
  Filled 2018-07-01: qty 2

## 2018-07-01 MED ORDER — ACETAMINOPHEN 325 MG PO TABS
650.0000 mg | ORAL_TABLET | Freq: Once | ORAL | Status: AC
  Administered 2018-07-01: 650 mg via ORAL

## 2018-07-01 MED ORDER — SODIUM CHLORIDE 0.9% FLUSH
10.0000 mL | Freq: Once | INTRAVENOUS | Status: AC
  Administered 2018-07-01: 10 mL
  Filled 2018-07-01: qty 10

## 2018-07-01 MED ORDER — HEPARIN SOD (PORK) LOCK FLUSH 100 UNIT/ML IV SOLN
500.0000 [IU] | Freq: Once | INTRAVENOUS | Status: AC | PRN
  Administered 2018-07-01: 500 [IU]
  Filled 2018-07-01: qty 5

## 2018-07-01 MED ORDER — TRASTUZUMAB CHEMO 150 MG IV SOLR
6.0000 mg/kg | Freq: Once | INTRAVENOUS | Status: AC
  Administered 2018-07-01: 525 mg via INTRAVENOUS
  Filled 2018-07-01: qty 25

## 2018-07-01 NOTE — Addendum Note (Signed)
Addended by: Nicholas Lose on: 07/01/2018 11:24 AM   Modules accepted: Orders

## 2018-07-01 NOTE — Patient Instructions (Signed)
Cancer Center Discharge Instructions for Patients Receiving Chemotherapy  Today you received the following chemotherapy agents Herceptin  To help prevent nausea and vomiting after your treatment, we encourage you to take your nausea medication as directed   If you develop nausea and vomiting that is not controlled by your nausea medication, call the clinic.   BELOW ARE SYMPTOMS THAT SHOULD BE REPORTED IMMEDIATELY:  *FEVER GREATER THAN 100.5 F  *CHILLS WITH OR WITHOUT FEVER  NAUSEA AND VOMITING THAT IS NOT CONTROLLED WITH YOUR NAUSEA MEDICATION  *UNUSUAL SHORTNESS OF BREATH  *UNUSUAL BRUISING OR BLEEDING  TENDERNESS IN MOUTH AND THROAT WITH OR WITHOUT PRESENCE OF ULCERS  *URINARY PROBLEMS  *BOWEL PROBLEMS  UNUSUAL RASH Items with * indicate a potential emergency and should be followed up as soon as possible.  Feel free to call the clinic should you have any questions or concerns. The clinic phone number is [phone].  Please show the CHEMO ALERT CARD at check-in to the Emergency Department and triage nurse.   

## 2018-07-02 ENCOUNTER — Other Ambulatory Visit: Payer: Self-pay | Admitting: *Deleted

## 2018-07-02 DIAGNOSIS — C50111 Malignant neoplasm of central portion of right female breast: Secondary | ICD-10-CM

## 2018-07-04 ENCOUNTER — Telehealth: Payer: Self-pay | Admitting: Hematology and Oncology

## 2018-07-04 MED FILL — metFORMIN HCL 500 MG TABS: 500 | 30 days supply | Qty: 60 | Fill #1

## 2018-07-04 MED FILL — AMLODIPINE BESYLATE 10 MG T: 10 | 30 days supply | Qty: 30 | Fill #1

## 2018-07-04 NOTE — Telephone Encounter (Signed)
I talk with patient regarding schedule  

## 2018-07-08 ENCOUNTER — Inpatient Hospital Stay: Payer: Medicare Other

## 2018-07-16 NOTE — Progress Notes (Signed)
Location of Breast Cancer: Malignant neoplasm of upper-outer quadrant of right breast in female, estrogen receptor positive [facility])  Histology per Pathology Report: 03/24/18:  Diagnosis 1. Breast, lumpectomy, Right w/seed - INVASIVE DUCTAL CARCINOMA, 1.2 CM, NOTTINGHAM GRADE 2 OF 3. - MARGINS OF RESECTION ARE NOT INVOLVED (TUMOR IS PRESENT GREATER THAN 10 MM FROM ALL INKED MARGINS). - DUCTAL CARCINOMA IN SITU. - BIOPSY SITE CHANGES. - SEE ONCOLOGY TABLE. 2. Breast, excision, Right re-excision superior margin - PREDOMINANTLY FATTY BREAST TISSUE, NEGATIVE FOR CARCINOMA. 3. Breast, excision, Right re-excision posterior margin - PREDOMINANTLY FATTY BREAST TISSUE, NEGATIVE FOR CARCINOMA. 4. Breast, excision, Right re-excision anterior margin - PREDOMINANTLY FATTY BREAST TISSUE, NEGATIVE FOR CARCINOMA. 5. Lymph node, sentinel, biopsy, Right axillary - ONE LYMPH NODE, NEGATIVE FOR CARCINOMA (0/1). 6. Lymph node, sentinel, biopsy, Right - ONE LYMPH NODE, NEGATIVE FOR CARCINOMA (0/1). 7. Lymph node, sentinel, biopsy, Right - ONE LYMPH NODE, NEGATIVE FOR CARCINOMA (0/1). 8. Lymph node, sentinel, biopsy, Right - ONE LYMPH NODE, NEGATIVE FOR CARCINOMA (0/1).  Receptor Status: ER(100%), PR (70%), Her2-neu (+3+), Ki-(20%)  Did patient present with symptoms (if so, please note symptoms) or was this found on screening mammography?: She had routine screening mammography on January 13 showing a possible abnormality in both breast. She underwent bilateral diagnostic mammography with tomography and right breast ultrasonography at [facility] on January 17 showing: suspicious 0.8 cm mass within the outer right breast. No abnormal appearing right axillary lymph nodes. No persistent suspicious abnormality in the area of the left breast screening study finding.  Past/Anticipated interventions by surgeon, if any: 03/24/18:  Surgeon:                     Edsel Petrin. Dalbert Batman, M.D., FACS  Procedure:                       Inject blue dye right breast                                        Right breast lumpectomy with radioactive seed localization                                        Reexcision posterior margin, superior margin, anterior margin                                        Right axillary deep sentinel lymph node biopsy     Past/Anticipated interventions by medical oncology, if any: Chemotherapy Per Dr. Lindi Adie 07/01/18:  Treatment plan: 1. Adj chemo with Taxol Herceptin weekly X 12 foll by Herceptin Q 3 weeks for 1 yearstarted 04/22/2018 2. Adjuvant radiation therapy followed by 3. Adjuvant antiestrogen therapy ------------------------------------------------------------------------------------------------------------------------ Current treatment: Taxol Herceptin week11, Taxol being discontinued and she will now get Herceptin every 3 weeks.  Echocardiogram:04/07/2018: EF 60 to 65% Labs reviewed  Chemo toxicities: Does not have any nausea or vomiting or any other side effects to chemotherapy. Severe peripheral neuropathy: It came on very suddenly and gotten much worse to the point that she is having trouble buttoning her shirt.  Monitoring closely. She stays active andbakesregularly.  Because of worsening peripheral neuropathy, I decided to discontinue Taxol.  I change her treatment regimen to every 3-week Herceptin. I will see her back once every 6 weeks with labs. I sent a referral to radiation oncology.  Lymphedema issues, if any:  Pt denies s/s of lymphedema and states she continues to do her exercises to prevent.   Pain issues, if any: Pt denies c/o pain.  SAFETY ISSUES:  Prior radiation? No  Pacemaker/ICD? No  Possible current pregnancy? No  Is the patient on methotrexate? No  Current Complaints / other details:  Pt presents today for consult with Dr. Sondra Come for Radiation Oncology.   BP (!) 147/66 (BP Location: Left Arm, Patient Position: Sitting)   Pulse 92    Temp 98.3 F (36.8 C) (Temporal)   Resp 18   Ht _0  (1.651 m)   Wt 187 lb 4 oz (84.9 kg)   SpO2 100%   BMI 31.16 kg/m   Wt Readings from Last 3 Encounters:  07/21/18 187 lb 4 oz (84.9 kg)  07/17/18 187 lb 12.8 oz (85.2 kg)  07/01/18 190 lb 9.6 oz (86.5 kg)       Loma Sousa, RN 07/21/2018,8:51 AM

## 2018-07-17 ENCOUNTER — Other Ambulatory Visit: Payer: Self-pay

## 2018-07-17 ENCOUNTER — Ambulatory Visit (INDEPENDENT_AMBULATORY_CARE_PROVIDER_SITE_OTHER): Payer: Medicare Other | Admitting: Adult Health

## 2018-07-17 VITALS — BP 141/65 | HR 95 | Temp 96.6°F | Ht 65.0 in | Wt 187.8 lb

## 2018-07-17 DIAGNOSIS — Z9989 Dependence on other enabling machines and devices: Secondary | ICD-10-CM | POA: Diagnosis not present

## 2018-07-17 DIAGNOSIS — G4733 Obstructive sleep apnea (adult) (pediatric): Secondary | ICD-10-CM

## 2018-07-17 DIAGNOSIS — I6330 Cerebral infarction due to thrombosis of unspecified cerebral artery: Secondary | ICD-10-CM

## 2018-07-17 NOTE — Patient Instructions (Signed)
Continue using CPAP nightly and greater than 4 hours each night °If your symptoms worsen or you develop new symptoms please let us know.  ° °

## 2018-07-17 NOTE — Progress Notes (Signed)
PATIENT: Sandra Shelton DOB: Oct 26, 1955  REASON FOR VISIT: follow up HISTORY FROM: patient  HISTORY OF PRESENT ILLNESS: Today 07/17/18:  Sandra Shelton is a 63 year old female with a history of obstructive sleep apnea on CPAP.  Her download indicates that she used her machine 30 out of 30 days for compliance of 100%.  She used her machine greater than 4 hours 18 days for compliance of 60%.  On average she uses her machine 4 hours and 17 minutes.  Her residual AHI 0.9 on 5 to 15 cm of water with EPR 2.  She does not have a significant leak.  Reports that she has noticed the benefit in using the CPAP.  She denies any issues.  She joins me today for follow-up.  HISTORY 02/18/18 (Copied from Dr.Dohmeier's note) Sandra Shelton is a 63 y.o. female , seen here as in a referral Dr. Leonie Man and NP Vonschaick for a sleep study after CVA. Last seen 12-03-2027, today is 02-18-2018. Chief complaint according to patient : I have more nocturia, and right facial droop from CVA causes more snoring.   Sandra Shelton is a 63 year old African-American right-handed female who presents today several months after a stroke.  The stroke was supposed to be a watershed infarct and a copy to the last note from the stroke clinic below.  She is currently on Norvasc, aspirin, Plavix, Lantus insulin Prinivil Glucophage and 2/min.  Review of systems was endorsed for right facial droop, blurred vision since a stroke and she let me know that she had a carotid endarterectomy in 2019.  Affecting the left side.  She also has been just diagnosed with right breast cancer bu mammogram and suffering from diabetes and hypertension. She will need to get off blood thinners to undergo lumpectomy.   Sleep habits are as follows: Dinner time 4.30 Pm and a snack before sleep. She retreats to the bedroom  at 7 PM and she reads in bed ( in a book) , but has the TV on, too. She prefers sleeping laterally, on one pillow, bed is non adjustable.  The  bedroom is cool.  No clock to be seen, but she may sleep within the hour- she rarely  finishes a show longer than 60 minutes. Nocturia 2-3 times.  Sister has witnessed her to snore and she has woken herself snoring and gasping. She lives alone, sleeps alone. Wakes up estimated at 5 PM , rises at 7 Total Sleep time at night at least 8 hours .        REVIEW OF SYSTEMS: Out of a complete 14 system review of symptoms, the patient complains only of the following symptoms, and all other reviewed systems are negative.  Epworth sleepiness score 7  ALLERGIES: No Known Allergies  HOME MEDICATIONS: Outpatient Medications Prior to Visit  Medication Sig Dispense Refill  . amLODipine (NORVASC) 10 MG tablet TAKE 1 TABLET BY MOUTH DAILY. 30 tablet 2  . aspirin 81 MG tablet Take 1 tablet (81 mg total) by mouth daily. 100 tablet 2  . blood glucose meter kit and supplies KIT Dispense based on patient and insurance preference. Use up to four times daily as directed. (FOR ICD-9 250.00, 250.01). 1 each 0  . clopidogrel (PLAVIX) 75 MG tablet TAKE 1 TABLET BY MOUTH DAILY. 30 tablet 2  . Insulin Pen Needle (TRUEPLUS PEN NEEDLES) 32G X 4 MM MISC Use as directed to inject insulin 100 each 3  . LANTUS SOLOSTAR 100 UNIT/ML Solostar Pen Inject  7 Units into the skin 2 (two) times daily. 15 mL 6  . lidocaine-prilocaine (EMLA) cream APPLY 1 APPLICATION TOPICALLY AS NEEDED. 30 g 0  . lisinopril (PRINIVIL,ZESTRIL) 5 MG tablet Take 1 tablet (5 mg total) by mouth daily. 90 tablet 6  . metFORMIN (GLUCOPHAGE) 500 MG tablet TAKE 1 TABLET BY MOUTH 2 TIMES DAILY WITH A MEAL. 60 tablet 2  . rosuvastatin (CRESTOR) 40 MG tablet Take 40 mg by mouth daily.     No facility-administered medications prior to visit.     PAST MEDICAL HISTORY: Past Medical History:  Diagnosis Date  . Breast cancer ([facility])    right IDC  . Diabetes mellitus without complication ([facility])    type 2  . Embolic stroke involving carotid artery ([facility])   .  GERD (gastroesophageal reflux disease)   . Heart murmur    no problems with it  . Hepatitis    in the 80's, took meds at the time   . Hypertension   . Peripheral vascular disease ([facility])    carotid artery blockage   . Stroke ([place]) 07/12/2017  . Vision abnormalities     PAST SURGICAL HISTORY: Past Surgical History:  Procedure Laterality Date  . BREAST EXCISIONAL BIOPSY Right   . BREAST LUMPECTOMY Right 03/2018  . BREAST LUMPECTOMY WITH RADIOACTIVE SEED AND SENTINEL LYMPH NODE BIOPSY Right 03/24/2018   Procedure: RIGHT BREAST LUMPECTOMY WITH RADIOACTIVE SEED AND RIGHT AXILLARY DEEP SENTINEL LYMPH NODE BIOPSY INJECT BLUE DYE RIGHT BREAST;  Surgeon: Fanny Skates, MD;  Location: [facility];  Service: General;  Laterality: Right;  . COLONOSCOPY    . PORTACATH PLACEMENT N/A 04/09/2018   Procedure: INSERTION PORT-A-CATH WITH ULTRASOUND;  Surgeon: Fanny Skates, MD;  Location: [facility];  Service: General;  Laterality: N/A;  . TRANSCAROTID ARTERY REVASCULARIZATION Left 07/24/2017   Procedure: TRANSCAROTID ARTERY REVASCULARIZATION, left;  Surgeon: Elam Dutch, MD;  Location: [facility];  Service: Vascular;  Laterality: Left;  . UTERINE FIBROID SURGERY     2005    FAMILY HISTORY: Family History  Problem Relation Age of Onset  . Diabetes Mellitus II Sister   . Stroke Mother   . Diabetes Mellitus II Mother   . Cancer Father   . Diabetes Mellitus II Brother   . Breast cancer Maternal Aunt     SOCIAL HISTORY: Social History   Socioeconomic History  . Marital status: Single    Spouse name: Not on file  . Number of children: Not on file  . Years of education: Not on file  . Highest education level: Not on file  Occupational History  . Not on file  Social Needs  . Financial resource strain: Not on file  . Food insecurity    Worry: Not on file    Inability: Not on file  . Transportation needs    Medical: Not on file    Non-medical: Not on file   Tobacco Use  . Smoking status: Never Smoker  . Smokeless tobacco: Never Used  Substance and Sexual Activity  . Alcohol use: No  . Drug use: No  . Sexual activity: Not on file  Lifestyle  . Physical activity    Days per week: Not on file    Minutes per session: Not on file  . Stress: Not on file  Relationships  . Social Herbalist on phone: Not on file    Gets together: Not on file    Attends religious service: Not  on file    Active member of club or organization: Not on file    Attends meetings of clubs or organizations: Not on file    Relationship status: Not on file  . Intimate partner violence    Fear of current or ex partner: Not on file    Emotionally abused: Not on file    Physically abused: Not on file    Forced sexual activity: Not on file  Other Topics Concern  . Not on file  Social History Narrative   Lives home alone.  Single.  Education Some college.  No children.        PHYSICAL EXAM  Vitals:   07/17/18 1347  BP: (!) 141/65  Pulse: 95  Temp: (!) 96.6 F (35.9 C)  Weight: 187 lb 12.8 oz (85.2 kg)  Height: 5' 5" (1.651 m)   Body mass index is 31.25 kg/m.  Generalized: Well developed, in no acute distress Chest: Lungs clear to auscultation  Neurological examination  Mentation: Alert oriented to time, place, history taking. Follows all commands speech and language fluent Cranial nerve II-XII: Pupils were equal round reactive to light. Extraocular movements were full, visual field were full on confrontational test. Facial sensation and strength were normal. Uvula tongue midline. Head turning and shoulder shrug  were normal and symmetric. Motor: The motor testing reveals 5 over 5 strength of all 4 extremities. Good symmetric motor tone is noted throughout.  Sensory: Sensory testing is intact to soft touch on all 4 extremities. No evidence of extinction is noted.  Coordination: Cerebellar testing reveals good finger-nose-finger and heel-to-shin  bilaterally.  Gait and station: Gait is normal.   DIAGNOSTIC DATA (LABS, IMAGING, TESTING) - I reviewed patient records, labs, notes, testing and imaging myself where available.  Lab Results  Component Value Date   WBC 6.3 07/01/2018   HGB 10.8 (L) 07/01/2018   HCT 33.4 (L) 07/01/2018   MCV 88.6 07/01/2018   PLT 331 07/01/2018      Component Value Date/Time   NA 139 07/01/2018 1057   NA 140 09/02/2017 1043   K 3.9 07/01/2018 1057   CL 106 07/01/2018 1057   CO2 23 07/01/2018 1057   GLUCOSE 162 (H) 07/01/2018 1057   BUN 14 07/01/2018 1057   BUN 12 09/02/2017 1043   CREATININE 0.86 07/01/2018 1057   CALCIUM 9.6 07/01/2018 1057   PROT 7.4 07/01/2018 1057   PROT 7.9 09/02/2017 1043   ALBUMIN 3.7 07/01/2018 1057   ALBUMIN 5.0 (H) 09/02/2017 1043   AST 15 07/01/2018 1057   ALT 19 07/01/2018 1057   ALKPHOS 49 07/01/2018 1057   BILITOT <0.2 (L) 07/01/2018 1057   GFRNONAA >60 07/01/2018 1057   GFRAA >60 07/01/2018 1057   Lab Results  Component Value Date   CHOL 147 09/02/2017   HDL 48 09/02/2017   LDLCALC 78 09/02/2017   TRIG 105 09/02/2017   CHOLHDL 3.1 09/02/2017   Lab Results  Component Value Date   HGBA1C 5.6 12/09/2017   No results found for: [id number] Lab Results  Component Value Date   TSH 0.496 07/08/2017      ASSESSMENT AND PLAN 63 y.o. year old female  has a past medical history of Breast cancer ([place]), Diabetes mellitus without complication ([facility]), Embolic stroke involving carotid artery ([facility]), GERD (gastroesophageal reflux disease), Heart murmur, Hepatitis, Hypertension, Peripheral vascular disease ([place]), Stroke ([place]) (07/12/2017), and Vision abnormalities. here with:  1.  Obstructive sleep apnea on CPAP  Overall the patient has done  well.  Her CPAP download indicates suboptimal compliance and good treatment of her apnea.  She is encouraged to continue using the machine nightly and greater than 4 hours each night.  She is advised that if her symptoms  worsen or she develops new symptoms she should let us know.  She will follow-up in 6 months or sooner if needed.   I spent 15 minutes with the patient. 50% of this time was spent reviewing CPAP download   Ward Givens, MSN, NP-C 07/17/2018, 2:11 PM [facility] [address] [place], [place] [address] [phone]

## 2018-07-21 ENCOUNTER — Ambulatory Visit: Payer: Self-pay | Admitting: Adult Health

## 2018-07-21 ENCOUNTER — Encounter: Payer: Self-pay | Admitting: Radiation Oncology

## 2018-07-21 ENCOUNTER — Ambulatory Visit
Admission: RE | Admit: 2018-07-21 | Discharge: 2018-07-21 | Disposition: A | Discharge status: Home / Self Care | Payer: Medicare Other | Source: Ambulatory Visit | Attending: Radiation Oncology | Admitting: Radiation Oncology

## 2018-07-21 ENCOUNTER — Other Ambulatory Visit: Payer: Self-pay

## 2018-07-21 VITALS — BP 147/66 | HR 92 | Temp 98.3°F | Resp 18 | Ht 65.0 in | Wt 187.2 lb

## 2018-07-21 DIAGNOSIS — Z79899 Other long term (current) drug therapy: Secondary | ICD-10-CM | POA: Insufficient documentation

## 2018-07-21 DIAGNOSIS — C50411 Malignant neoplasm of upper-outer quadrant of right female breast: Secondary | ICD-10-CM

## 2018-07-21 DIAGNOSIS — Z7982 Long term (current) use of aspirin: Secondary | ICD-10-CM | POA: Insufficient documentation

## 2018-07-21 DIAGNOSIS — Z170 Estrogen receptor positive status [ER+]: Secondary | ICD-10-CM

## 2018-07-21 DIAGNOSIS — G629 Polyneuropathy, unspecified: Secondary | ICD-10-CM | POA: Diagnosis not present

## 2018-07-21 DIAGNOSIS — Z794 Long term (current) use of insulin: Secondary | ICD-10-CM | POA: Diagnosis not present

## 2018-07-21 DIAGNOSIS — Z9889 Other specified postprocedural states: Secondary | ICD-10-CM | POA: Diagnosis not present

## 2018-07-21 DIAGNOSIS — Z510 Encounter for antineoplastic radiation therapy: Secondary | ICD-10-CM | POA: Diagnosis not present

## 2018-07-21 DIAGNOSIS — Z9221 Personal history of antineoplastic chemotherapy: Secondary | ICD-10-CM | POA: Diagnosis not present

## 2018-07-21 NOTE — Patient Instructions (Signed)
Coronavirus (COVID-19) Are you at risk?  Are you at risk for the Coronavirus (COVID-19)?  To be considered HIGH RISK for Coronavirus (COVID-19), you have to meet the following criteria:  . Traveled to [place], [place], [place], [place] or [place]; or in the [place] to [place], [place], [place], or [place]; and have fever, cough, and shortness of breath within the last 2 weeks of travel OR . Been in close contact with a person diagnosed with COVID-19 within the last 2 weeks and have fever, cough, and shortness of breath . IF YOU DO NOT MEET THESE CRITERIA, YOU ARE CONSIDERED LOW RISK FOR COVID-19.  What to do if you are HIGH RISK for COVID-19?  . If you are having a medical emergency, call 911. . Seek medical care right away. Before you go to a doctor's office, urgent care or emergency department, call ahead and tell them about your recent travel, contact with someone diagnosed with COVID-19, and your symptoms. You should receive instructions from your physician's office regarding next steps of care.  . When you arrive at healthcare provider, tell the healthcare staff immediately you have returned from visiting [place], [place], [place], [place] or [place]; or traveled in the [place] to [place], [place], [place], or [place]; in the last two weeks or you have been in close contact with a person diagnosed with COVID-19 in the last 2 weeks.   . Tell the health care staff about your symptoms: fever, cough and shortness of breath. . After you have been seen by a medical provider, you will be either: o Tested for (COVID-19) and discharged home on quarantine except to seek medical care if symptoms worsen, and asked to  - Stay home and avoid contact with others until you get your results (4-5 days)  - Avoid travel on public transportation if possible (such as bus, train, or airplane) or o Sent to the Emergency Department by EMS for evaluation, COVID-19 testing, and possible  admission depending on your condition and test results.  What to do if you are LOW RISK for COVID-19?  Reduce your risk of any infection by using the same precautions used for avoiding the common cold or flu:  . Wash your hands often with soap and warm water for at least 20 seconds.  If soap and water are not readily available, use an alcohol-based hand sanitizer with at least 60% alcohol.  . If coughing or sneezing, cover your mouth and nose by coughing or sneezing into the elbow areas of your shirt or coat, into a tissue or into your sleeve (not your hands). . Avoid shaking hands with others and consider head nods or verbal greetings only. . Avoid touching your eyes, nose, or mouth with unwashed hands.  . Avoid close contact with people who are sick. . Avoid places or events with large numbers of people in one location, like concerts or sporting events. . Carefully consider travel plans you have or are making. . If you are planning any travel outside or inside the [place], visit the CDC's Travelers' Health webpage for the latest health notices. . If you have some symptoms but not all symptoms, continue to monitor at home and seek medical attention if your symptoms worsen. . If you are having a medical emergency, call 911.   ADDITIONAL HEALTHCARE OPTIONS FOR PATIENTS  [facility] / e-Visit: [email or web address]         [facility]: [phone]  [facility]: [phone]                   [facility]: [phone]   

## 2018-07-21 NOTE — Progress Notes (Signed)
  Radiation Oncology         [phone] ________________________________  Name: Sandra Shelton MRN: [id number]  Date: 07/21/2018  DOB: 1955-06-12  SIMULATION AND TREATMENT PLANNING NOTE    ICD-10-CM   1. Malignant neoplasm of upper-outer quadrant of right breast in female, estrogen receptor positive ([place] [facility])  C50.411    Z17.0     DIAGNOSIS:   Stage IA (pT1c, pN0)rightBreast UOQ Invasive Ductal Carcinoma, ER+/ PR+/ Her2+, GradeII   NARRATIVE:  The patient was brought to the [facility].  Identity was confirmed.  All relevant records and images related to the planned course of therapy were reviewed.  The patient freely provided informed written consent to proceed with treatment after reviewing the details related to the planned course of therapy. The consent form was witnessed and verified by the simulation staff.  Then, the patient was set-up in a stable reproducible  supine position for radiation therapy.  CT images were obtained.  Surface markings were placed.  The CT images were loaded into the planning software.  Then the target and avoidance structures were contoured.  Treatment planning then occurred.  The radiation prescription was entered and confirmed.  Then, I designed and supervised the construction of a total of 5 medically necessary complex treatment devices.  I have requested : 3D Simulation  I have requested a DVH of the following structures: heart, lungs,   lumpectomy cavity.  I have ordered:dose calc.  PLAN:  The patient will receive 50.4 Gy in 28 fractions followed by a boost to the lumpectomy cavity of 10 Gray in 5 fractions for a cumulative dose of 60.4 Gray.  Patient will be evaluated for hypofractionated accelerated radiation therapy ( if technically possible).  -----------------------------------  Blair Promise, PhD, MD This document serves as a record of services personally performed by Gery Pray, MD. It was created on his behalf by Mary-Margaret  Loma Messing, a trained medical scribe. The creation of this record is based on the scribe's personal observations and the provider's statements to them. This document has been checked and approved by the attending provider.

## 2018-07-21 NOTE — Progress Notes (Signed)
Radiation Oncology         [phone] ________________________________  Name: Sandra Shelton MRN: [id number]  Date: 07/21/2018  DOB: 27-Apr-1955  Reevaluation Visit Note  CC: [name], MD  Nicholas Lose, MD    ICD-10-CM   1. 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  she is doing well overall. She was previously seen in the Breast [facility] on 02/12/18. Since then she has completed definitive surgery and began chemotherapy treatments on 04/22/18. Her most recent dose was 07/01/18 cycle 11 out of 16 with Taxol Herceptin weekly.  Since they were last seen in the office, they had right lumpectomy and SLNB on 03/24/18. Pathology confirmed invasive ductal carcinoma with DCIS, 1.2cm, grade 2 with negative margins. Lymph nodes were negative for malignancy.  Mammogram on 06/27/18 following a palpable lump showed benign seroma in the right breast and no evidence of malignancy.                 On review of systems, she reports neuropathy related symptoms with Taxol but this issue seems to be improving. she denies right breast pain nipple discharge or bleeding and any other symptoms. Pertinent positives are listed and detailed within the above HPI.                 ALLERGIES:  has No Known Allergies.  Meds: Current Outpatient Medications  Medication Sig Dispense Refill  . amLODipine (NORVASC) 10 MG tablet TAKE 1 TABLET BY MOUTH DAILY. 30 tablet 2  . aspirin 81 MG tablet Take 1 tablet (81 mg total) by mouth daily. 100 tablet 2  . blood glucose meter kit and supplies KIT Dispense based on patient and insurance preference. Use up to four times daily as directed. (FOR ICD-9 250.00, 250.01). 1 each 0  . clopidogrel (PLAVIX) 75 MG tablet TAKE 1 TABLET BY  MOUTH DAILY. 30 tablet 2  . Insulin Pen Needle (TRUEPLUS PEN NEEDLES) 32G X 4 MM MISC Use as directed to inject insulin 100 each 3  . LANTUS SOLOSTAR 100 UNIT/ML Solostar Pen Inject 7 Units into the skin 2 (two) times daily. 15 mL 6  . lidocaine-prilocaine (EMLA) cream APPLY 1 APPLICATION TOPICALLY AS NEEDED. 30 g 0  . lisinopril (PRINIVIL,ZESTRIL) 5 MG tablet Take 1 tablet (5 mg total) by mouth daily. 90 tablet 6  . metFORMIN (GLUCOPHAGE) 500 MG tablet TAKE 1 TABLET BY MOUTH 2 TIMES DAILY WITH A MEAL. 60 tablet 2  . rosuvastatin (CRESTOR) 40 MG tablet Take 40 mg by mouth daily.     No current facility-administered medications for this encounter.     Physical Findings: The patient is in no acute distress. Patient is alert and oriented.  height is 5' 5"  (1.651 m) and weight is 187 lb 4 oz (84.9 kg). Her temporal temperature is 98.3 F (36.8 C). Her blood pressure is 147/66 (abnormal) and her pulse is 92. Her respiration is 18 and oxygen saturation is 100%. .  The lungs are clear to auscultation.  The heart has a regular rhythm and rate.  Left breast is large and pendulous without mass or nipple discharge.  The right breast area is large and pendulous without mass or nipple discharge.  Patient has  well-healed scar in the lateral aspect of the breast area with some induration associated with the scar.  No dominant mass.  Patient also has a separate scar in the right axillary region which is also healed well.  Lab Findings: Lab Results  Component Value Date   WBC 6.3 07/01/2018   HGB 10.8 (L) 07/01/2018   HCT 33.4 (L) 07/01/2018   MCV 88.6 07/01/2018   PLT 331 07/01/2018    Radiographic Findings: US Breast Ltd Uni Right Inc Axilla  Result Date: 06/27/2018 CLINICAL DATA:  Patient underwent RIGHT lumpectomy in March 2020. On recent physical exam by Dr. Dalbert Batman, a mass was palpated in the UPPER-OUTER QUADRANT of the RIGHT breast. At lumpectomy path: invasive ductal carcinoma measuring 1.2  centimeters, grade 2 of 3 and ductal carcinoma in situ. Margins were negative.Four sentinel axillary lymph nodes were negative for malignancy. EXAM: DIGITAL DIAGNOSTIC RIGHT MAMMOGRAM WITH CAD AND TOMO ULTRASOUND RIGHT BREAST COMPARISON:  03/21/2018 and earlier ACR Breast Density Category b: There are scattered areas of fibroglandular density. FINDINGS: Postoperative changes are identified in the UPPER-OUTER QUADRANT of the RIGHT breast. BB marks the mass in the LATERAL portion of the RIGHT breast. Spot tangential view it is region is unremarkable. A benign intramammary lymph node in the UPPER-OUTER QUADRANT is stable. Mammographic images were processed with CAD. On physical exam, I palpate soft thickening beneath the scar in the area of concern to the patient. Targeted ultrasound is performed, showing small irregular anechoic seroma cavity with internal septations in the 9 o'clock location of the RIGHT breast 3 centimeters from the nipple measuring 1.8 x 2.2 x 2.3 centimeters and correlating with the palpable finding. No other suspicious abnormalities are identified. IMPRESSION: Palpable abnormality in the RIGHT breast represents benign seroma following lumpectomy. No mammographic or ultrasound evidence for malignancy. RECOMMENDATION: Bilateral diagnostic mammogram is recommended in January 2021. I have discussed the findings and recommendations with the patient. Results were also provided in writing at the conclusion of the visit. If applicable, a reminder letter will be sent to the patient regarding the next appointment. BI-RADS CATEGORY  2: Benign. Electronically Signed   By: Nolon Nations M.D.   On: 06/27/2018 12:20   Mm Diag Breast Tomo Uni Right  Result Date: 06/27/2018 CLINICAL DATA:  Patient underwent RIGHT lumpectomy in March 2020. On recent physical exam by Dr. Dalbert Batman, a mass was palpated in the UPPER-OUTER QUADRANT of the RIGHT breast. At lumpectomy path: invasive ductal carcinoma measuring 1.2  centimeters, grade 2 of 3 and ductal carcinoma in situ. Margins were negative.Four sentinel axillary lymph nodes were negative for malignancy. EXAM: DIGITAL DIAGNOSTIC RIGHT MAMMOGRAM WITH CAD AND TOMO ULTRASOUND RIGHT BREAST COMPARISON:  03/21/2018 and earlier ACR Breast Density Category b: There are scattered areas of fibroglandular density. FINDINGS: Postoperative changes are identified in the UPPER-OUTER QUADRANT of the RIGHT breast. BB marks the mass in the LATERAL portion of the RIGHT breast. Spot tangential view it is region is unremarkable. A benign intramammary lymph node in the UPPER-OUTER QUADRANT is stable. Mammographic images were processed with CAD. On physical exam, I palpate soft thickening beneath the scar in the area of concern to the patient. Targeted ultrasound is performed, showing small irregular anechoic seroma cavity with internal septations in the 9 o'clock location of the RIGHT breast 3 centimeters from the nipple measuring 1.8 x 2.2 x 2.3 centimeters and correlating with the palpable finding. No other suspicious abnormalities are identified. IMPRESSION: Palpable abnormality in the RIGHT breast represents benign seroma  following lumpectomy. No mammographic or ultrasound evidence for malignancy. RECOMMENDATION: Bilateral diagnostic mammogram is recommended in January 2021. I have discussed the findings and recommendations with the patient. Results were also provided in writing at the conclusion of the visit. If applicable, a reminder letter will be sent to the patient regarding the next appointment. BI-RADS CATEGORY  2: Benign. Electronically Signed   By: Nolon Nations M.D.   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 is now ready to proceed with radiation as breast radiation therapy.  She completed an abbreviated course of Taxol related to peripheral neuropathy symptoms.  This seems to be already  improving. Today, I talked to the patient about the findings and work-up thus far.  We discussed the natural history of invasive ductal breast cancer and general treatment, highlighting the role of radiotherapy in the management.  We discussed the available radiation techniques, and focused on the details of logistics and delivery.  We reviewed the anticipated acute and late sequelae associated with radiation in this setting.  The patient was encouraged to ask questions that I answered to the best of my ability.  A patient consent form was discussed and signed.  We retained a copy for our records.  The patient would like to proceed with radiation and will be scheduled for CT simulation.  Plan: Patient will proceed with planning later today and treatment to begin approximately a week later.  We will attempt hypofractionated accelerated radiation therapy if technically possible.  ____________________________________   Blair Promise, PhD, MD    This document serves as a record of services personally performed by Gery Pray, MD. It was created on his behalf by Mary-Margaret Loma Messing, a trained medical scribe. The creation of this record is based on the scribe's personal observations and the provider's statements to them. This document has been checked and approved by the attending provider.

## 2018-07-22 ENCOUNTER — Inpatient Hospital Stay: Payer: Medicare Other | Attending: Hematology and Oncology

## 2018-07-22 ENCOUNTER — Other Ambulatory Visit: Payer: Self-pay

## 2018-07-22 VITALS — BP 124/56 | HR 90 | Temp 98.5°F | Resp 20 | Ht 65.0 in | Wt 190.2 lb

## 2018-07-22 DIAGNOSIS — C50411 Malignant neoplasm of upper-outer quadrant of right female breast: Secondary | ICD-10-CM | POA: Insufficient documentation

## 2018-07-22 DIAGNOSIS — Z7982 Long term (current) use of aspirin: Secondary | ICD-10-CM | POA: Diagnosis not present

## 2018-07-22 DIAGNOSIS — Z794 Long term (current) use of insulin: Secondary | ICD-10-CM | POA: Diagnosis not present

## 2018-07-22 DIAGNOSIS — G629 Polyneuropathy, unspecified: Secondary | ICD-10-CM | POA: Insufficient documentation

## 2018-07-22 DIAGNOSIS — Z170 Estrogen receptor positive status [ER+]: Secondary | ICD-10-CM | POA: Insufficient documentation

## 2018-07-22 DIAGNOSIS — Z5112 Encounter for antineoplastic immunotherapy: Secondary | ICD-10-CM | POA: Insufficient documentation

## 2018-07-22 DIAGNOSIS — Z79899 Other long term (current) drug therapy: Secondary | ICD-10-CM | POA: Insufficient documentation

## 2018-07-22 MED ORDER — HEPARIN SOD (PORK) LOCK FLUSH 100 UNIT/ML IV SOLN
500.0000 [IU] | Freq: Once | INTRAVENOUS | Status: AC | PRN
  Administered 2018-07-22: 10:00:00 500 [IU]
  Filled 2018-07-22: qty 5

## 2018-07-22 MED ORDER — SODIUM CHLORIDE 0.9% FLUSH
10.0000 mL | INTRAVENOUS | Status: DC | PRN
  Administered 2018-07-22: 10 mL
  Filled 2018-07-22: qty 10

## 2018-07-22 MED ORDER — TRASTUZUMAB CHEMO 150 MG IV SOLR
6.0000 mg/kg | Freq: Once | INTRAVENOUS | Status: AC
  Administered 2018-07-22: 525 mg via INTRAVENOUS
  Filled 2018-07-22: qty 25

## 2018-07-22 MED ORDER — DIPHENHYDRAMINE HCL 25 MG PO CAPS
50.0000 mg | ORAL_CAPSULE | Freq: Once | ORAL | Status: AC
  Administered 2018-07-22: 09:00:00 50 mg via ORAL

## 2018-07-22 MED ORDER — DIPHENHYDRAMINE HCL 25 MG PO CAPS
ORAL_CAPSULE | ORAL | Status: AC
  Filled 2018-07-22: qty 2

## 2018-07-22 MED ORDER — ACETAMINOPHEN 325 MG PO TABS
650.0000 mg | ORAL_TABLET | Freq: Once | ORAL | Status: AC
  Administered 2018-07-22: 09:00:00 650 mg via ORAL

## 2018-07-22 MED ORDER — SODIUM CHLORIDE 0.9 % IV SOLN
Freq: Once | INTRAVENOUS | Status: AC
  Administered 2018-07-22: 09:00:00 via INTRAVENOUS
  Filled 2018-07-22: qty 250

## 2018-07-22 MED ORDER — ACETAMINOPHEN 325 MG PO TABS
ORAL_TABLET | ORAL | Status: AC
  Filled 2018-07-22: qty 2

## 2018-07-22 NOTE — Progress Notes (Signed)
Ok to treat with Echo from 04/07/2018 per Dr. Lindi Adie.

## 2018-07-22 NOTE — Patient Instructions (Signed)
[facility] Discharge Instructions for Patients Receiving Chemotherapy  Today you received the following chemotherapy agents: Trastuzumab (Herceptin)  To help prevent nausea and vomiting after your treatment, we encourage you to take your nausea medication as directed.   If you develop nausea and vomiting that is not controlled by your nausea medication, call the clinic.   BELOW ARE SYMPTOMS THAT SHOULD BE REPORTED IMMEDIATELY:  *FEVER GREATER THAN 100.5 F  *CHILLS WITH OR WITHOUT FEVER  NAUSEA AND VOMITING THAT IS NOT CONTROLLED WITH YOUR NAUSEA MEDICATION  *UNUSUAL SHORTNESS OF BREATH  *UNUSUAL BRUISING OR BLEEDING  TENDERNESS IN MOUTH AND THROAT WITH OR WITHOUT PRESENCE OF ULCERS  *URINARY PROBLEMS  *BOWEL PROBLEMS  UNUSUAL RASH Items with * indicate a potential emergency and should be followed up as soon as possible.  Feel free to call the clinic should you have any questions or concerns. The clinic phone number is [phone].  Please show the CHEMO ALERT CARD at check-in to the Emergency Department and triage nurse.  Coronavirus (COVID-19) Are you at risk?  Are you at risk for the Coronavirus (COVID-19)?  To be considered HIGH RISK for Coronavirus (COVID-19), you have to meet the following criteria:  . Traveled to [place], [place], [place], [place] or [place]; or in the [place] to [place], [place], [place], or [place]; and have fever, cough, and shortness of breath within the last 2 weeks of travel OR . Been in close contact with a person diagnosed with COVID-19 within the last 2 weeks and have fever, cough, and shortness of breath . IF YOU DO NOT MEET THESE CRITERIA, YOU ARE CONSIDERED LOW RISK FOR COVID-19.  What to do if you are HIGH RISK for COVID-19?  . If you are having a medical emergency, call 911. . Seek medical care right away. Before you go to a doctor's office, urgent care or emergency department, call ahead and tell them  about your recent travel, contact with someone diagnosed with COVID-19, and your symptoms. You should receive instructions from your physician's office regarding next steps of care.  . When you arrive at healthcare provider, tell the healthcare staff immediately you have returned from visiting [place], [place], [place], [place] or [place]; or traveled in the [place] to [place], [place], [place], or [place]; in the last two weeks or you have been in close contact with a person diagnosed with COVID-19 in the last 2 weeks.   . Tell the health care staff about your symptoms: fever, cough and shortness of breath. . After you have been seen by a medical provider, you will be either: o Tested for (COVID-19) and discharged home on quarantine except to seek medical care if symptoms worsen, and asked to  - Stay home and avoid contact with others until you get your results (4-5 days)  - Avoid travel on public transportation if possible (such as bus, train, or airplane) or o Sent to the Emergency Department by EMS for evaluation, COVID-19 testing, and possible admission depending on your condition and test results.  What to do if you are LOW RISK for COVID-19?  Reduce your risk of any infection by using the same precautions used for avoiding the common cold or flu:  . Wash your hands often with soap and warm water for at least 20 seconds.  If soap and water are not readily available, use an alcohol-based hand sanitizer with at least 60% alcohol.  . If coughing or   sneezing, cover your mouth and nose by coughing or sneezing into the elbow areas of your shirt or coat, into a tissue or into your sleeve (not your hands). . Avoid shaking hands with others and consider head nods or verbal greetings only. . Avoid touching your eyes, nose, or mouth with unwashed hands.  . Avoid close contact with people who are sick. . Avoid places or events with large numbers of people in one location, like concerts or  sporting events. . Carefully consider travel plans you have or are making. . If you are planning any travel outside or inside the [place], visit the CDC's Travelers' Health webpage for the latest health notices. . If you have some symptoms but not all symptoms, continue to monitor at home and seek medical attention if your symptoms worsen. . If you are having a medical emergency, call 911.   ADDITIONAL HEALTHCARE OPTIONS FOR PATIENTS  [facility] Tele[facility] / e-Visit: [email or web address]         [facility]: [phone]  [facility]: [phone]                   [facility]: [phone]   

## 2018-07-24 DIAGNOSIS — Z170 Estrogen receptor positive status [ER+]: Secondary | ICD-10-CM | POA: Diagnosis not present

## 2018-07-24 DIAGNOSIS — Z510 Encounter for antineoplastic radiation therapy: Secondary | ICD-10-CM | POA: Diagnosis not present

## 2018-07-24 DIAGNOSIS — C50411 Malignant neoplasm of upper-outer quadrant of right female breast: Secondary | ICD-10-CM | POA: Diagnosis not present

## 2018-07-25 ENCOUNTER — Other Ambulatory Visit: Payer: Self-pay | Admitting: Hematology and Oncology

## 2018-07-25 MED FILL — CLOPIDOGREL 75 MG TABLET: 75 | 30 days supply | Qty: 30 | Fill #1

## 2018-07-25 MED FILL — LIDOCAINE-PRILOCAINE CREAM: 2.5-2.5 | 30 days supply | Qty: 30 | Fill #0

## 2018-07-28 ENCOUNTER — Ambulatory Visit
Admission: RE | Admit: 2018-07-28 | Discharge: 2018-07-28 | Disposition: A | Discharge status: Home / Self Care | Payer: Medicare Other | Source: Ambulatory Visit | Attending: Radiation Oncology | Admitting: Radiation Oncology

## 2018-07-28 ENCOUNTER — Other Ambulatory Visit: Payer: Self-pay

## 2018-07-28 DIAGNOSIS — C50411 Malignant neoplasm of upper-outer quadrant of right female breast: Secondary | ICD-10-CM | POA: Diagnosis not present

## 2018-07-28 DIAGNOSIS — Z170 Estrogen receptor positive status [ER+]: Secondary | ICD-10-CM | POA: Diagnosis not present

## 2018-07-28 DIAGNOSIS — Z510 Encounter for antineoplastic radiation therapy: Secondary | ICD-10-CM | POA: Diagnosis not present

## 2018-07-28 NOTE — Progress Notes (Signed)
  Radiation Oncology         [phone] ________________________________  Name: Sandra Shelton MRN: [id number]  Date: 07/28/2018  DOB: 11/25/1955  Simulation Verification Note    ICD-10-CM   1. Malignant neoplasm of upper-outer quadrant of right breast in female, estrogen receptor positive ([place])  C50.411    Z17.0     Status: outpatient  NARRATIVE: The patient was brought to the treatment unit and placed in the planned treatment position. The clinical setup was verified. Then port films were obtained and uploaded to the radiation oncology medical record software.  The treatment beams were carefully compared against the planned radiation fields. The position location and shape of the radiation fields was reviewed. They targeted volume of tissue appears to be appropriately covered by the radiation beams. Organs at risk appear to be excluded as planned.  Based on my personal review, I approved the simulation verification. The patient's treatment will proceed as planned.  -----------------------------------  Blair Promise, PhD, MD  This document serves as a record of services personally performed by Gery Pray, MD. It was created on his behalf by Mary-Margaret Loma Messing, a trained medical scribe. The creation of this record is based on the scribe's personal observations and the provider's statements to them. This document has been checked and approved by the attending provider.

## 2018-07-29 ENCOUNTER — Ambulatory Visit
Admission: RE | Admit: 2018-07-29 | Discharge: 2018-07-29 | Disposition: A | Discharge status: Home / Self Care | Payer: Medicare Other | Source: Ambulatory Visit | Attending: Radiation Oncology | Admitting: Radiation Oncology

## 2018-07-29 DIAGNOSIS — Z510 Encounter for antineoplastic radiation therapy: Secondary | ICD-10-CM | POA: Diagnosis not present

## 2018-07-29 DIAGNOSIS — Z170 Estrogen receptor positive status [ER+]: Secondary | ICD-10-CM | POA: Diagnosis not present

## 2018-07-29 DIAGNOSIS — C50111 Malignant neoplasm of central portion of right female breast: Secondary | ICD-10-CM

## 2018-07-29 DIAGNOSIS — C50411 Malignant neoplasm of upper-outer quadrant of right female breast: Secondary | ICD-10-CM | POA: Diagnosis not present

## 2018-07-29 MED ORDER — RADIAPLEXRX EX GEL
Freq: Two times a day (BID) | CUTANEOUS | Status: DC
  Administered 2018-07-29: 17:00:00 via TOPICAL

## 2018-07-29 MED ORDER — ALRA NON-METALLIC DEODORANT (RAD-ONC)
1.0000 "application " | Freq: Once | TOPICAL | Status: AC
  Administered 2018-07-29: 1 via TOPICAL

## 2018-07-30 ENCOUNTER — Ambulatory Visit
Admission: RE | Admit: 2018-07-30 | Discharge: 2018-07-30 | Disposition: A | Discharge status: Home / Self Care | Payer: Medicare Other | Source: Ambulatory Visit | Attending: Radiation Oncology | Admitting: Radiation Oncology

## 2018-07-30 ENCOUNTER — Other Ambulatory Visit: Payer: Self-pay

## 2018-07-30 DIAGNOSIS — C50411 Malignant neoplasm of upper-outer quadrant of right female breast: Secondary | ICD-10-CM | POA: Diagnosis not present

## 2018-07-30 DIAGNOSIS — Z170 Estrogen receptor positive status [ER+]: Secondary | ICD-10-CM | POA: Diagnosis not present

## 2018-07-30 DIAGNOSIS — Z510 Encounter for antineoplastic radiation therapy: Secondary | ICD-10-CM | POA: Diagnosis not present

## 2018-07-31 ENCOUNTER — Ambulatory Visit (HOSPITAL_COMMUNITY)
Admission: RE | Admit: 2018-07-31 | Discharge: 2018-07-31 | Disposition: A | Discharge status: Home / Self Care | Payer: Medicare Other | Source: Ambulatory Visit | Attending: Hematology and Oncology | Admitting: Hematology and Oncology

## 2018-07-31 ENCOUNTER — Ambulatory Visit
Admission: RE | Admit: 2018-07-31 | Discharge: 2018-07-31 | Disposition: A | Discharge status: Home / Self Care | Payer: Medicare Other | Source: Ambulatory Visit | Attending: Radiation Oncology | Admitting: Radiation Oncology

## 2018-07-31 ENCOUNTER — Other Ambulatory Visit: Payer: Self-pay

## 2018-07-31 DIAGNOSIS — C50411 Malignant neoplasm of upper-outer quadrant of right female breast: Secondary | ICD-10-CM | POA: Diagnosis not present

## 2018-07-31 DIAGNOSIS — Z510 Encounter for antineoplastic radiation therapy: Secondary | ICD-10-CM | POA: Diagnosis not present

## 2018-07-31 DIAGNOSIS — C50111 Malignant neoplasm of central portion of right female breast: Secondary | ICD-10-CM | POA: Diagnosis not present

## 2018-07-31 DIAGNOSIS — I10 Essential (primary) hypertension: Secondary | ICD-10-CM | POA: Insufficient documentation

## 2018-07-31 DIAGNOSIS — E119 Type 2 diabetes mellitus without complications: Secondary | ICD-10-CM | POA: Insufficient documentation

## 2018-07-31 DIAGNOSIS — Z170 Estrogen receptor positive status [ER+]: Secondary | ICD-10-CM | POA: Diagnosis not present

## 2018-08-01 ENCOUNTER — Other Ambulatory Visit: Payer: Self-pay

## 2018-08-01 ENCOUNTER — Ambulatory Visit
Admission: RE | Admit: 2018-08-01 | Discharge: 2018-08-01 | Disposition: A | Discharge status: Home / Self Care | Payer: Medicare Other | Source: Ambulatory Visit | Attending: Radiation Oncology | Admitting: Radiation Oncology

## 2018-08-01 DIAGNOSIS — Z170 Estrogen receptor positive status [ER+]: Secondary | ICD-10-CM | POA: Diagnosis not present

## 2018-08-01 DIAGNOSIS — Z510 Encounter for antineoplastic radiation therapy: Secondary | ICD-10-CM | POA: Diagnosis not present

## 2018-08-01 DIAGNOSIS — C50411 Malignant neoplasm of upper-outer quadrant of right female breast: Secondary | ICD-10-CM | POA: Diagnosis not present

## 2018-08-04 ENCOUNTER — Other Ambulatory Visit: Payer: Self-pay

## 2018-08-04 ENCOUNTER — Ambulatory Visit
Admission: RE | Admit: 2018-08-04 | Discharge: 2018-08-04 | Disposition: A | Discharge status: Home / Self Care | Payer: Medicare Other | Source: Ambulatory Visit | Attending: Radiation Oncology | Admitting: Radiation Oncology

## 2018-08-04 DIAGNOSIS — C50411 Malignant neoplasm of upper-outer quadrant of right female breast: Secondary | ICD-10-CM | POA: Diagnosis not present

## 2018-08-04 DIAGNOSIS — Z510 Encounter for antineoplastic radiation therapy: Secondary | ICD-10-CM | POA: Diagnosis not present

## 2018-08-04 DIAGNOSIS — Z170 Estrogen receptor positive status [ER+]: Secondary | ICD-10-CM | POA: Diagnosis not present

## 2018-08-05 ENCOUNTER — Ambulatory Visit
Admission: RE | Admit: 2018-08-05 | Discharge: 2018-08-05 | Disposition: A | Discharge status: Home / Self Care | Payer: Medicare Other | Source: Ambulatory Visit | Attending: Radiation Oncology | Admitting: Radiation Oncology

## 2018-08-05 ENCOUNTER — Other Ambulatory Visit: Payer: Self-pay

## 2018-08-05 DIAGNOSIS — Z170 Estrogen receptor positive status [ER+]: Secondary | ICD-10-CM | POA: Diagnosis not present

## 2018-08-05 DIAGNOSIS — C50411 Malignant neoplasm of upper-outer quadrant of right female breast: Secondary | ICD-10-CM | POA: Diagnosis not present

## 2018-08-05 DIAGNOSIS — Z510 Encounter for antineoplastic radiation therapy: Secondary | ICD-10-CM | POA: Diagnosis not present

## 2018-08-06 ENCOUNTER — Ambulatory Visit
Admission: RE | Admit: 2018-08-06 | Discharge: 2018-08-06 | Disposition: A | Discharge status: Home / Self Care | Payer: Medicare Other | Source: Ambulatory Visit | Attending: Radiation Oncology | Admitting: Radiation Oncology

## 2018-08-06 ENCOUNTER — Other Ambulatory Visit: Payer: Self-pay

## 2018-08-06 DIAGNOSIS — C50411 Malignant neoplasm of upper-outer quadrant of right female breast: Secondary | ICD-10-CM | POA: Diagnosis not present

## 2018-08-06 DIAGNOSIS — Z170 Estrogen receptor positive status [ER+]: Secondary | ICD-10-CM | POA: Diagnosis not present

## 2018-08-06 DIAGNOSIS — Z510 Encounter for antineoplastic radiation therapy: Secondary | ICD-10-CM | POA: Diagnosis not present

## 2018-08-06 MED FILL — AMLODIPINE BESYLATE 10 MG T: 10 | 30 days supply | Qty: 30 | Fill #2

## 2018-08-06 MED FILL — LISINOPRIL 5 MG TAB: 5 | 90 days supply | Qty: 90 | Fill #6

## 2018-08-06 MED FILL — metFORMIN HCL 500 MG TABS: 500 | 30 days supply | Qty: 60 | Fill #2

## 2018-08-07 ENCOUNTER — Ambulatory Visit
Admission: RE | Admit: 2018-08-07 | Discharge: 2018-08-07 | Disposition: A | Discharge status: Home / Self Care | Payer: Medicare Other | Source: Ambulatory Visit | Attending: Radiation Oncology | Admitting: Radiation Oncology

## 2018-08-07 ENCOUNTER — Other Ambulatory Visit: Payer: Self-pay

## 2018-08-07 DIAGNOSIS — Z510 Encounter for antineoplastic radiation therapy: Secondary | ICD-10-CM | POA: Diagnosis not present

## 2018-08-07 DIAGNOSIS — Z170 Estrogen receptor positive status [ER+]: Secondary | ICD-10-CM | POA: Diagnosis not present

## 2018-08-07 DIAGNOSIS — C50411 Malignant neoplasm of upper-outer quadrant of right female breast: Secondary | ICD-10-CM | POA: Diagnosis not present

## 2018-08-07 NOTE — Assessment & Plan Note (Signed)
02/04/2018:Screening mammogram detected right breast mass and asymmetry in the left breast. The left breast asymmetry resolved. 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 Adj chemo with Taxol Herceptin weekly X 11 (stopped for peripheral neuropathy) started 04/22/2018-07/01/2018, now on [name] to complete 1 full year of therapy 2. Adjuvant radiation therapy followed by 3. Adjuvant antiestrogen therapy ------------------------------------------------------------------------------------------------------------------------ Current treatment: [name] therapy every 3 weeks Chemo-induced peripheral neuropathy:  Radiation treatment started 07/29/2018 Once radiation is complete we will start antiestrogen therapy with anastrozole.  Return to clinic every 3 weeks for Herceptin every 6 weeks of follow-up with me.

## 2018-08-08 ENCOUNTER — Ambulatory Visit
Admission: RE | Admit: 2018-08-08 | Discharge: 2018-08-08 | Disposition: A | Discharge status: Home / Self Care | Payer: Medicare Other | Source: Ambulatory Visit | Attending: Radiation Oncology | Admitting: Radiation Oncology

## 2018-08-08 ENCOUNTER — Other Ambulatory Visit: Payer: Self-pay

## 2018-08-08 DIAGNOSIS — Z170 Estrogen receptor positive status [ER+]: Secondary | ICD-10-CM | POA: Diagnosis not present

## 2018-08-08 DIAGNOSIS — C50411 Malignant neoplasm of upper-outer quadrant of right female breast: Secondary | ICD-10-CM | POA: Diagnosis not present

## 2018-08-08 DIAGNOSIS — Z510 Encounter for antineoplastic radiation therapy: Secondary | ICD-10-CM | POA: Diagnosis not present

## 2018-08-11 ENCOUNTER — Ambulatory Visit
Admission: RE | Admit: 2018-08-11 | Discharge: 2018-08-11 | Disposition: A | Discharge status: Home / Self Care | Payer: Medicare Other | Source: Ambulatory Visit | Attending: Radiation Oncology | Admitting: Radiation Oncology

## 2018-08-11 ENCOUNTER — Other Ambulatory Visit: Payer: Self-pay

## 2018-08-11 DIAGNOSIS — Z170 Estrogen receptor positive status [ER+]: Secondary | ICD-10-CM | POA: Diagnosis not present

## 2018-08-11 DIAGNOSIS — C50111 Malignant neoplasm of central portion of right female breast: Secondary | ICD-10-CM

## 2018-08-11 DIAGNOSIS — Z510 Encounter for antineoplastic radiation therapy: Secondary | ICD-10-CM | POA: Diagnosis not present

## 2018-08-11 DIAGNOSIS — C50411 Malignant neoplasm of upper-outer quadrant of right female breast: Secondary | ICD-10-CM | POA: Diagnosis not present

## 2018-08-11 NOTE — Progress Notes (Signed)
Patient Care Team: [name], MD as PCP - General (Internal Medicine) Fanny Skates, MD as Consulting Physician (General Surgery) Nicholas Lose, MD as Consulting Physician (Hematology and Oncology) Gery Pray, MD as Consulting Physician (Radiation Oncology)  DIAGNOSIS:    ICD-10-CM   1. Malignant neoplasm of upper-outer quadrant of right breast in female, estrogen receptor positive ([place])  C50.411    Z17.0     SUMMARY OF ONCOLOGIC HISTORY: Oncology History  Malignant neoplasm of upper-outer quadrant of right breast in female, estrogen receptor positive ([place])  01/27/2018 Mammogram   Screening Mammogram  Possible right breast mass; asymmetry in left breast.   02/10/2018 Initial Diagnosis   Screening mammogram detected right breast mass and asymmetry in the left breast.  The left breast asymmetry resolved.  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%, HER-2 +3+ by IHC, T1BN0 stage Ia clinical stage   03/24/2018 Surgery   Lumpectomy and SLNB: IDC, 1.2cm, grade 2, margins negative, 4 SLN negative, T1cN0, previously ER/PR/HER-2 positive with Ki-67 of 20%   03/24/2018 Cancer Staging   Staging form: Breast, AJCC 8th Edition - Pathologic stage from 03/24/2018: Stage IA (pT1c, pN0, cM0, G2, ER+, PR+, HER2+) - Signed by Gardenia Phlegm, NP on 03/26/2018   04/22/2018 - 06/24/2018 Chemotherapy   The patient had trastuzumab (HERCEPTIN) 336 mg in sodium chloride 0.9 % 250 mL chemo infusion, 4 mg/kg = 336 mg, Intravenous,  Once, 3 of 16 cycles Administration: 336 mg (04/22/2018), 168 mg (04/29/2018), 168 mg (05/21/2018), 168 mg (05/06/2018), 168 mg (05/13/2018), 168 mg (05/27/2018), 168 mg (06/03/2018), 168 mg (06/10/2018), 168 mg (06/17/2018), 168 mg (06/24/2018) PACLitaxel (TAXOL) 156 mg in sodium chloride 0.9 % 250 mL chemo infusion (</= 34m/m2), 80 mg/m2 = 156 mg, Intravenous,  Once,  3 of 3 cycles Administration: 156 mg (04/22/2018), 156 mg (04/29/2018), 156 mg (05/21/2018), 156 mg (05/06/2018), 156 mg (05/13/2018), 156 mg (05/27/2018), 156 mg (06/03/2018), 156 mg (06/10/2018), 156 mg (06/17/2018), 156 mg (06/24/2018)  for chemotherapy treatment.    07/01/2018 -  Chemotherapy   The patient had trastuzumab (HERCEPTIN) 525 mg in sodium chloride 0.9 % 250 mL chemo infusion, 6 mg/kg = 525 mg (100 % of original dose 6 mg/kg), Intravenous,  Once, 2 of 5 cycles Dose modification: 6 mg/kg (original dose 6 mg/kg, Cycle 1, Reason: Provider Judgment) Administration: 525 mg (07/01/2018), 525 mg (07/22/2018)  for chemotherapy treatment.    07/29/2018 -  Radiation Therapy   Adjuvant XRT     CHIEF COMPLIANT: Follow-up during radiation to discuss further treatment  INTERVAL HISTORY: Sandra Shelton a 63y.o. with above-mentioned history of right breast cancer who underwent a lumpectomy, adjuvant chemotherapy and is currently undergoing radiation. She presents to the clinic today to discuss further treatment with anti-estrogen therapy.  She continues to have mild peripheral neuropathy.  She is worried about darkening of the skin of the hands and feet.  REVIEW OF SYSTEMS:   Constitutional: Denies fevers, chills or abnormal weight loss Eyes: Denies blurriness of vision Ears, nose, mouth, throat, and face: Denies mucositis or sore throat Respiratory: Denies cough, dyspnea or wheezes Cardiovascular: Denies palpitation, chest discomfort Gastrointestinal: Denies nausea, heartburn or change in bowel habits Skin: Darkening of skin of hands and feet Lymphatics: Denies new lymphadenopathy or easy bruising Neurological: Mild peripheral neuropathy Behavioral/Psych: Mood is stable, no new changes  Extremities: No lower extremity edema  Breast: denies any pain or lumps or nodules in either breasts All other systems were reviewed with the patient and are negative.  I have reviewed the past medical history, past  surgical history, social history and family history with the patient and they are unchanged from previous note.  ALLERGIES:  has No Known Allergies.  MEDICATIONS:  Current Outpatient Medications  Medication Sig Dispense Refill  . amLODipine (NORVASC) 10 MG tablet TAKE 1 TABLET BY MOUTH DAILY. 30 tablet 2  . aspirin 81 MG tablet Take 1 tablet (81 mg total) by mouth daily. 100 tablet 2  . blood glucose meter kit and supplies KIT Dispense based on patient and insurance preference. Use up to four times daily as directed. (FOR ICD-9 250.00, 250.01). 1 each 0  . clopidogrel (PLAVIX) 75 MG tablet TAKE 1 TABLET BY MOUTH DAILY. 30 tablet 2  . Insulin Pen Needle (TRUEPLUS PEN NEEDLES) 32G X 4 MM MISC Use as directed to inject insulin 100 each 3  . LANTUS SOLOSTAR 100 UNIT/ML Solostar Pen Inject 7 Units into the skin 2 (two) times daily. 15 mL 6  . lidocaine-prilocaine (EMLA) cream APPLY 1 APPLICATION TOPICALLY AS NEEDED. 30 g 0  . lisinopril (PRINIVIL,ZESTRIL) 5 MG tablet Take 1 tablet (5 mg total) by mouth daily. 90 tablet 6  . metFORMIN (GLUCOPHAGE) 500 MG tablet TAKE 1 TABLET BY MOUTH 2 TIMES DAILY WITH A MEAL. 60 tablet 2  . rosuvastatin (CRESTOR) 40 MG tablet Take 40 mg by mouth daily.     No current facility-administered medications for this visit.     PHYSICAL EXAMINATION: ECOG PERFORMANCE STATUS: 1 - Symptomatic but completely ambulatory  Vitals:   08/12/18 0827  BP: (!) 153/59  Pulse: 85  Resp: 17  Temp: 98.7 F (37.1 C)  SpO2: 100%   Filed Weights   08/12/18 0827  Weight: 188 lb 9.6 oz (85.5 kg)    GENERAL: alert, no distress and comfortable SKIN: Darkening of skin of hands and feet as expected and is starting to lighten up. EYES: normal, Conjunctiva are pink and non-injected, sclera clear OROPHARYNX: no exudate, no erythema and lips, buccal mucosa, and tongue normal  NECK: supple, thyroid normal size, non-tender, without nodularity LYMPH: no palpable lymphadenopathy in the  cervical, axillary or inguinal LUNGS: clear to auscultation and percussion with normal breathing effort HEART: regular rate & rhythm and no murmurs and no lower extremity edema ABDOMEN: abdomen soft, non-tender and normal bowel sounds MUSCULOSKELETAL: no cyanosis of digits and no clubbing  NEURO: alert & oriented x 3 with fluent speech, grade 1 peripheral neuropathy EXTREMITIES: No lower extremity edema  LABORATORY DATA:  I have reviewed the data as listed CMP Latest Ref Rng & Units 07/01/2018 06/24/2018 06/17/2018  Glucose 70 - 99 mg/dL 162(H) 161(H) 150(H)  BUN 8 - 23 mg/dL _0 Creatinine 0.44 - 1.00 mg/dL 0.86 0.88 0.77  Sodium 135 - 145 mmol/L 139 141 139  Potassium 3.5 - 5.1 mmol/L 3.9 4.0 3.5  Chloride 98 - 111 mmol/L 106 111 109  CO2 22 - 32 mmol/L _1 Calcium 8.9 - 10.3 mg/dL 9.6 9.9 9.6  Total Protein 6.5 - 8.1 g/dL 7.4 7.2 7.2  Total Bilirubin 0.3 - 1.2 mg/dL <0.2(L) <0.2(L) <0.2(L)  Alkaline Phos 38 - 126 U/L 49 41 47  AST 15 - 41 U/L _2 ALT 0 - 44 U/L _3 Lab Results  Component Value Date  WBC 5.1 08/12/2018   HGB 11.0 (L) 08/12/2018   HCT 34.3 (L) 08/12/2018   MCV 88.4 08/12/2018   PLT 298 08/12/2018   NEUTROABS 3.6 08/12/2018    ASSESSMENT & PLAN:  Malignant neoplasm of upper-outer quadrant of right breast in female, estrogen receptor positive ([place]) 02/04/2018:Screening mammogram detected right breast mass and asymmetry in the left breast. The left breast asymmetry resolved. 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 Adj chemo with Taxol Herceptin weekly X 11 (stopped for peripheral neuropathy) started 04/22/2018-07/01/2018, now on Herceptin  maintenance to complete 1 full year of therapy 2. Adjuvant radiation therapy followed by 3. Adjuvant antiestrogen therapy ------------------------------------------------------------------------------------------------------------------------ Current treatment: [name] therapy every 3 weeks Chemo-induced peripheral neuropathy: Stable  Radiation treatment started 07/29/2018 Once radiation is complete we will start antiestrogen therapy with anastrozole on 09/05/2018.  Anastrozole counseling: We discussed the risks and benefits of anti-estrogen therapy with aromatase inhibitors. These include but not limited to insomnia, hot flashes, mood changes, vaginal dryness, bone density loss, and weight gain. We strongly believe that the benefits far outweigh the risks. Patient understands these risks and consented to starting treatment. Planned treatment duration is 5-7 years. I sent a prescription for anastrozole today.  Return to clinic every 3 weeks for Herceptin every 6 weeks of follow-up with me.    No orders of the defined types were placed in this encounter.  The patient has a good understanding of the overall plan. she agrees with it. she will call with any problems that may develop before the next visit here.  Nicholas Lose, MD 08/12/2018  Sandra Shelton am acting as scribe for Dr. Nicholas Lose.  I have reviewed the above documentation for accuracy and completeness, and I agree with the above.

## 2018-08-12 ENCOUNTER — Inpatient Hospital Stay: Payer: Medicare Other

## 2018-08-12 ENCOUNTER — Ambulatory Visit: Admission: RE | Admit: 2018-08-12 | Payer: Medicare Other | Source: Ambulatory Visit | Admitting: Radiation Oncology

## 2018-08-12 ENCOUNTER — Inpatient Hospital Stay (HOSPITAL_BASED_OUTPATIENT_CLINIC_OR_DEPARTMENT_OTHER): Payer: Medicare Other | Admitting: Hematology and Oncology

## 2018-08-12 ENCOUNTER — Other Ambulatory Visit: Payer: Self-pay

## 2018-08-12 ENCOUNTER — Ambulatory Visit
Admission: RE | Admit: 2018-08-12 | Discharge: 2018-08-12 | Disposition: A | Discharge status: Home / Self Care | Payer: Medicare Other | Source: Ambulatory Visit | Attending: Radiation Oncology | Admitting: Radiation Oncology

## 2018-08-12 DIAGNOSIS — Z794 Long term (current) use of insulin: Secondary | ICD-10-CM | POA: Diagnosis not present

## 2018-08-12 DIAGNOSIS — C50111 Malignant neoplasm of central portion of right female breast: Secondary | ICD-10-CM

## 2018-08-12 DIAGNOSIS — Z7982 Long term (current) use of aspirin: Secondary | ICD-10-CM | POA: Diagnosis not present

## 2018-08-12 DIAGNOSIS — Z5112 Encounter for antineoplastic immunotherapy: Secondary | ICD-10-CM | POA: Diagnosis not present

## 2018-08-12 DIAGNOSIS — C50411 Malignant neoplasm of upper-outer quadrant of right female breast: Secondary | ICD-10-CM

## 2018-08-12 DIAGNOSIS — Z170 Estrogen receptor positive status [ER+]: Secondary | ICD-10-CM

## 2018-08-12 DIAGNOSIS — Z95828 Presence of other vascular implants and grafts: Secondary | ICD-10-CM

## 2018-08-12 DIAGNOSIS — Z79899 Other long term (current) drug therapy: Secondary | ICD-10-CM | POA: Diagnosis not present

## 2018-08-12 DIAGNOSIS — Z510 Encounter for antineoplastic radiation therapy: Secondary | ICD-10-CM | POA: Diagnosis not present

## 2018-08-12 DIAGNOSIS — G629 Polyneuropathy, unspecified: Secondary | ICD-10-CM

## 2018-08-12 LAB — CMP (CANCER CENTER ONLY)
ALT: 17 U/L (ref 0–44)
AST: 15 U/L (ref 15–41)
Albumin: 3.6 g/dL (ref 3.5–5.0)
Alkaline Phosphatase: 48 U/L (ref 38–126)
Anion gap: 8 (ref 5–15)
BUN: 17 mg/dL (ref 8–23)
CO2: 24 mmol/L (ref 22–32)
Calcium: 9.7 mg/dL (ref 8.9–10.3)
Chloride: 109 mmol/L (ref 98–111)
Creatinine: 0.93 mg/dL (ref 0.44–1.00)
GFR, Est AFR Am: >60 mL/min (ref >60)
GFR, Estimated: >60 mL/min (ref >60)
Glucose, Bld: 226 mg/dL — ABNORMAL HIGH (ref 70–99)
Potassium: 3.9 mmol/L (ref 3.5–5.1)
Sodium: 141 mmol/L (ref 135–145)
Total Bilirubin: <0.2 mg/dL — ABNORMAL LOW (ref 0.3–1.2)
Total Protein: 7.1 g/dL (ref 6.5–8.1)

## 2018-08-12 LAB — CBC WITH DIFFERENTIAL (CANCER CENTER ONLY)
Abs Immature Granulocytes: 0.01 10*3/uL (ref 0.00–0.07)
Basophils Absolute: 0 10*3/uL (ref 0.0–0.1)
Basophils Relative: 1 %
Eosinophils Absolute: 0.1 10*3/uL (ref 0.0–0.5)
Eosinophils Relative: 2 %
HCT: 34.3 % — ABNORMAL LOW (ref 36.0–46.0)
Hemoglobin: 11 g/dL — ABNORMAL LOW (ref 12.0–15.0)
Immature Granulocytes: 0 %
Lymphocytes Relative: 20 %
Lymphs Abs: 1.1 10*3/uL (ref 0.7–4.0)
MCH: 28.4 pg (ref 26.0–34.0)
MCHC: 32.1 g/dL (ref 30.0–36.0)
MCV: 88.4 fL (ref 80.0–100.0)
Monocytes Absolute: 0.4 10*3/uL (ref 0.1–1.0)
Monocytes Relative: 8 %
Neutro Abs: 3.6 10*3/uL (ref 1.7–7.7)
Neutrophils Relative %: 69 %
Platelet Count: 298 10*3/uL (ref 150–400)
RBC: 3.88 MIL/uL (ref 3.87–5.11)
RDW: 13.3 % (ref 11.5–15.5)
WBC Count: 5.1 10*3/uL (ref 4.0–10.5)
nRBC: 0 % (ref 0.0–0.2)

## 2018-08-12 MED ORDER — SODIUM CHLORIDE 0.9% FLUSH
10.0000 mL | INTRAVENOUS | Status: DC | PRN
  Administered 2018-08-12: 10:00:00 10 mL
  Filled 2018-08-12: qty 10

## 2018-08-12 MED ORDER — ACETAMINOPHEN 325 MG PO TABS
650.0000 mg | ORAL_TABLET | Freq: Once | ORAL | Status: AC
  Administered 2018-08-12: 09:00:00 650 mg via ORAL

## 2018-08-12 MED ORDER — DIPHENHYDRAMINE HCL 25 MG PO CAPS
ORAL_CAPSULE | ORAL | Status: AC
  Filled 2018-08-12: qty 2

## 2018-08-12 MED ORDER — SODIUM CHLORIDE 0.9% FLUSH
10.0000 mL | Freq: Once | INTRAVENOUS | Status: AC
  Administered 2018-08-12: 10 mL
  Filled 2018-08-12: qty 10

## 2018-08-12 MED ORDER — ACETAMINOPHEN 325 MG PO TABS
ORAL_TABLET | ORAL | Status: AC
  Filled 2018-08-12: qty 2

## 2018-08-12 MED ORDER — ANASTROZOLE 1 MG PO TABS: 1.0000 mg | ORAL_TABLET | Freq: Every day | ORAL | 3 refills | Status: DC

## 2018-08-12 MED ORDER — TRASTUZUMAB CHEMO 150 MG IV SOLR
6.0000 mg/kg | Freq: Once | INTRAVENOUS | Status: AC
  Administered 2018-08-12: 10:00:00 525 mg via INTRAVENOUS
  Filled 2018-08-12: qty 25

## 2018-08-12 MED ORDER — HEPARIN SOD (PORK) LOCK FLUSH 100 UNIT/ML IV SOLN
500.0000 [IU] | Freq: Once | INTRAVENOUS | Status: AC | PRN
  Administered 2018-08-12: 10:00:00 500 [IU]
  Filled 2018-08-12: qty 5

## 2018-08-12 MED ORDER — DIPHENHYDRAMINE HCL 25 MG PO CAPS
50.0000 mg | ORAL_CAPSULE | Freq: Once | ORAL | Status: AC
  Administered 2018-08-12: 09:00:00 50 mg via ORAL

## 2018-08-12 MED ORDER — SODIUM CHLORIDE 0.9 % IV SOLN
Freq: Once | INTRAVENOUS | Status: AC
  Administered 2018-08-12: 09:00:00 via INTRAVENOUS
  Filled 2018-08-12: qty 250

## 2018-08-12 MED FILL — ANASTROZOLE 1 MG TABLET: 1 | 90 days supply | Qty: 90 | Fill #0

## 2018-08-12 NOTE — Patient Instructions (Signed)
[facility] Discharge Instructions for Patients Receiving Chemotherapy  Today you received the following chemotherapy agents: Trastuzumab (Herceptin)  To help prevent nausea and vomiting after your treatment, we encourage you to take your nausea medication as directed.   If you develop nausea and vomiting that is not controlled by your nausea medication, call the clinic.   BELOW ARE SYMPTOMS THAT SHOULD BE REPORTED IMMEDIATELY:  *FEVER GREATER THAN 100.5 F  *CHILLS WITH OR WITHOUT FEVER  NAUSEA AND VOMITING THAT IS NOT CONTROLLED WITH YOUR NAUSEA MEDICATION  *UNUSUAL SHORTNESS OF BREATH  *UNUSUAL BRUISING OR BLEEDING  TENDERNESS IN MOUTH AND THROAT WITH OR WITHOUT PRESENCE OF ULCERS  *URINARY PROBLEMS  *BOWEL PROBLEMS  UNUSUAL RASH Items with * indicate a potential emergency and should be followed up as soon as possible.  Feel free to call the clinic should you have any questions or concerns. The clinic phone number is [phone].  Please show the CHEMO ALERT CARD at check-in to the Emergency Department and triage nurse.  Coronavirus (COVID-19) Are you at risk?  Are you at risk for the Coronavirus (COVID-19)?  To be considered HIGH RISK for Coronavirus (COVID-19), you have to meet the following criteria:  . Traveled to [place], [place], [place], [place] or [place]; or in the [place] to [place], [place], [place], or [place]; and have fever, cough, and shortness of breath within the last 2 weeks of travel OR . Been in close contact with a person diagnosed with COVID-19 within the last 2 weeks and have fever, cough, and shortness of breath . IF YOU DO NOT MEET THESE CRITERIA, YOU ARE CONSIDERED LOW RISK FOR COVID-19.  What to do if you are HIGH RISK for COVID-19?  . If you are having a medical emergency, call 911. . Seek medical care right away. Before you go to a doctor's office, urgent care or emergency department, call ahead and tell them  about your recent travel, contact with someone diagnosed with COVID-19, and your symptoms. You should receive instructions from your physician's office regarding next steps of care.  . When you arrive at healthcare provider, tell the healthcare staff immediately you have returned from visiting [place], [place], [place], [place] or [place]; or traveled in the [place] to [place], [place], [place], or [place]; in the last two weeks or you have been in close contact with a person diagnosed with COVID-19 in the last 2 weeks.   . Tell the health care staff about your symptoms: fever, cough and shortness of breath. . After you have been seen by a medical provider, you will be either: o Tested for (COVID-19) and discharged home on quarantine except to seek medical care if symptoms worsen, and asked to  - Stay home and avoid contact with others until you get your results (4-5 days)  - Avoid travel on public transportation if possible (such as bus, train, or airplane) or o Sent to the Emergency Department by EMS for evaluation, COVID-19 testing, and possible admission depending on your condition and test results.  What to do if you are LOW RISK for COVID-19?  Reduce your risk of any infection by using the same precautions used for avoiding the common cold or flu:  . Wash your hands often with soap and warm water for at least 20 seconds.  If soap and water are not readily available, use an alcohol-based hand sanitizer with at least 60% alcohol.  . If coughing or   sneezing, cover your mouth and nose by coughing or sneezing into the elbow areas of your shirt or coat, into a tissue or into your sleeve (not your hands). . Avoid shaking hands with others and consider head nods or verbal greetings only. . Avoid touching your eyes, nose, or mouth with unwashed hands.  . Avoid close contact with people who are sick. . Avoid places or events with large numbers of people in one location, like concerts or  sporting events. . Carefully consider travel plans you have or are making. . If you are planning any travel outside or inside the [place], visit the CDC's Travelers' Health webpage for the latest health notices. . If you have some symptoms but not all symptoms, continue to monitor at home and seek medical attention if your symptoms worsen. . If you are having a medical emergency, call 911.   ADDITIONAL HEALTHCARE OPTIONS FOR PATIENTS  [facility] / e-Visit: [email or web address]         [facility]: [phone]  [facility]: [phone]                   [facility]: [phone]   

## 2018-08-13 ENCOUNTER — Telehealth: Payer: Self-pay | Admitting: Hematology and Oncology

## 2018-08-13 ENCOUNTER — Other Ambulatory Visit: Payer: Self-pay

## 2018-08-13 ENCOUNTER — Ambulatory Visit
Admission: RE | Admit: 2018-08-13 | Discharge: 2018-08-13 | Disposition: A | Discharge status: Home / Self Care | Payer: Medicare Other | Source: Ambulatory Visit | Attending: Radiation Oncology | Admitting: Radiation Oncology

## 2018-08-13 DIAGNOSIS — Z170 Estrogen receptor positive status [ER+]: Secondary | ICD-10-CM | POA: Diagnosis not present

## 2018-08-13 DIAGNOSIS — Z510 Encounter for antineoplastic radiation therapy: Secondary | ICD-10-CM | POA: Diagnosis not present

## 2018-08-13 DIAGNOSIS — C50411 Malignant neoplasm of upper-outer quadrant of right female breast: Secondary | ICD-10-CM | POA: Diagnosis not present

## 2018-08-14 ENCOUNTER — Other Ambulatory Visit: Payer: Self-pay

## 2018-08-14 ENCOUNTER — Ambulatory Visit
Admission: RE | Admit: 2018-08-14 | Discharge: 2018-08-14 | Disposition: A | Discharge status: Home / Self Care | Payer: Medicare Other | Source: Ambulatory Visit | Attending: Radiation Oncology | Admitting: Radiation Oncology

## 2018-08-14 DIAGNOSIS — Z510 Encounter for antineoplastic radiation therapy: Secondary | ICD-10-CM | POA: Diagnosis not present

## 2018-08-14 DIAGNOSIS — C50411 Malignant neoplasm of upper-outer quadrant of right female breast: Secondary | ICD-10-CM | POA: Diagnosis not present

## 2018-08-14 DIAGNOSIS — Z170 Estrogen receptor positive status [ER+]: Secondary | ICD-10-CM | POA: Diagnosis not present

## 2018-08-15 ENCOUNTER — Ambulatory Visit
Admission: RE | Admit: 2018-08-15 | Discharge: 2018-08-15 | Disposition: A | Discharge status: Home / Self Care | Payer: Medicare Other | Source: Ambulatory Visit | Attending: Radiation Oncology | Admitting: Radiation Oncology

## 2018-08-15 ENCOUNTER — Other Ambulatory Visit: Payer: Self-pay

## 2018-08-15 DIAGNOSIS — C50411 Malignant neoplasm of upper-outer quadrant of right female breast: Secondary | ICD-10-CM | POA: Diagnosis not present

## 2018-08-15 DIAGNOSIS — Z170 Estrogen receptor positive status [ER+]: Secondary | ICD-10-CM | POA: Diagnosis not present

## 2018-08-15 DIAGNOSIS — Z510 Encounter for antineoplastic radiation therapy: Secondary | ICD-10-CM | POA: Diagnosis not present

## 2018-08-18 ENCOUNTER — Ambulatory Visit
Admission: RE | Admit: 2018-08-18 | Discharge: 2018-08-18 | Disposition: A | Discharge status: Home / Self Care | Payer: Medicare Other | Source: Ambulatory Visit | Attending: Radiation Oncology | Admitting: Radiation Oncology

## 2018-08-18 ENCOUNTER — Other Ambulatory Visit: Payer: Self-pay

## 2018-08-18 DIAGNOSIS — Z170 Estrogen receptor positive status [ER+]: Secondary | ICD-10-CM | POA: Insufficient documentation

## 2018-08-18 DIAGNOSIS — C50411 Malignant neoplasm of upper-outer quadrant of right female breast: Secondary | ICD-10-CM | POA: Diagnosis not present

## 2018-08-18 DIAGNOSIS — Z510 Encounter for antineoplastic radiation therapy: Secondary | ICD-10-CM | POA: Insufficient documentation

## 2018-08-19 ENCOUNTER — Other Ambulatory Visit: Payer: Self-pay

## 2018-08-19 ENCOUNTER — Ambulatory Visit
Admission: RE | Admit: 2018-08-19 | Discharge: 2018-08-19 | Disposition: A | Discharge status: Home / Self Care | Payer: Medicare Other | Source: Ambulatory Visit | Attending: Radiation Oncology | Admitting: Radiation Oncology

## 2018-08-19 DIAGNOSIS — C50411 Malignant neoplasm of upper-outer quadrant of right female breast: Secondary | ICD-10-CM | POA: Diagnosis not present

## 2018-08-19 DIAGNOSIS — I6522 Occlusion and stenosis of left carotid artery: Secondary | ICD-10-CM

## 2018-08-19 DIAGNOSIS — Z170 Estrogen receptor positive status [ER+]: Secondary | ICD-10-CM | POA: Diagnosis not present

## 2018-08-19 DIAGNOSIS — Z510 Encounter for antineoplastic radiation therapy: Secondary | ICD-10-CM | POA: Diagnosis not present

## 2018-08-19 MED ORDER — RADIAPLEXRX EX GEL
Freq: Once | CUTANEOUS | Status: AC
  Administered 2018-08-19: 10:00:00 via TOPICAL

## 2018-08-19 NOTE — Progress Notes (Signed)
The following biosimilar Ogivri (trastuzumab-dkst) has been selected for use in this patient.  Kennith Center, Pharm.D., CPP 08/19/2018@4 :41 PM

## 2018-08-20 ENCOUNTER — Ambulatory Visit
Admission: RE | Admit: 2018-08-20 | Discharge: 2018-08-20 | Disposition: A | Discharge status: Home / Self Care | Payer: Medicare Other | Source: Ambulatory Visit | Attending: Radiation Oncology | Admitting: Radiation Oncology

## 2018-08-20 ENCOUNTER — Other Ambulatory Visit: Payer: Self-pay

## 2018-08-20 DIAGNOSIS — Z170 Estrogen receptor positive status [ER+]: Secondary | ICD-10-CM | POA: Diagnosis not present

## 2018-08-20 DIAGNOSIS — Z510 Encounter for antineoplastic radiation therapy: Secondary | ICD-10-CM | POA: Diagnosis not present

## 2018-08-20 DIAGNOSIS — C50411 Malignant neoplasm of upper-outer quadrant of right female breast: Secondary | ICD-10-CM | POA: Diagnosis not present

## 2018-08-21 ENCOUNTER — Other Ambulatory Visit: Payer: Self-pay

## 2018-08-21 ENCOUNTER — Ambulatory Visit (HOSPITAL_COMMUNITY)
Admission: RE | Admit: 2018-08-21 | Discharge: 2018-08-21 | Disposition: A | Discharge status: Home / Self Care | Payer: Medicare Other | Source: Ambulatory Visit | Attending: Family | Admitting: Family

## 2018-08-21 ENCOUNTER — Ambulatory Visit (INDEPENDENT_AMBULATORY_CARE_PROVIDER_SITE_OTHER): Payer: Medicare Other | Admitting: Family

## 2018-08-21 ENCOUNTER — Encounter: Payer: Self-pay | Admitting: Family

## 2018-08-21 ENCOUNTER — Ambulatory Visit
Admission: RE | Admit: 2018-08-21 | Discharge: 2018-08-21 | Disposition: A | Discharge status: Home / Self Care | Payer: Medicare Other | Source: Ambulatory Visit | Attending: Radiation Oncology | Admitting: Radiation Oncology

## 2018-08-21 VITALS — BP 146/71 | HR 74 | Temp 97.9°F | Resp 14 | Ht 65.0 in | Wt 182.0 lb

## 2018-08-21 DIAGNOSIS — Z9889 Other specified postprocedural states: Secondary | ICD-10-CM

## 2018-08-21 DIAGNOSIS — Z95828 Presence of other vascular implants and grafts: Secondary | ICD-10-CM

## 2018-08-21 DIAGNOSIS — I63132 Cerebral infarction due to embolism of left carotid artery: Secondary | ICD-10-CM | POA: Diagnosis not present

## 2018-08-21 DIAGNOSIS — Z510 Encounter for antineoplastic radiation therapy: Secondary | ICD-10-CM | POA: Diagnosis not present

## 2018-08-21 DIAGNOSIS — I6522 Occlusion and stenosis of left carotid artery: Secondary | ICD-10-CM | POA: Diagnosis not present

## 2018-08-21 DIAGNOSIS — I6330 Cerebral infarction due to thrombosis of unspecified cerebral artery: Secondary | ICD-10-CM | POA: Diagnosis not present

## 2018-08-21 DIAGNOSIS — C50411 Malignant neoplasm of upper-outer quadrant of right female breast: Secondary | ICD-10-CM | POA: Diagnosis not present

## 2018-08-21 DIAGNOSIS — Z170 Estrogen receptor positive status [ER+]: Secondary | ICD-10-CM | POA: Diagnosis not present

## 2018-08-21 NOTE — Patient Instructions (Signed)

## 2018-08-21 NOTE — Progress Notes (Signed)
Chief Complaint: Follow up Extracranial Carotid Artery Stenosis   History of Present Illness  Sandra Shelton is a 63 y.o. female who is status post left TCAR by Dr. Oneida Alar on 08/2017.  During hospitalization for DKA she was found to have right leg and right arm numbness and weakness.  Neurology was consulted and work-up included a CT of her head which demonstrated a frontal lobe left hemispheric stroke.  She was brought back as an outpatient for T CAR by Dr. Oneida Alar.   She was last evaluated by M. Eveland PA-C on 02-20-18. At that time left neck incision was well-healed Carotid duplex demonstrated a patent left carotid stent without hemodynamically significant in-stent stenosis, right ICA 1 to 39% stenosis by duplex Patient will need to continue aspirin, Plavix, and statin therapy indefinitely Recheck carotid duplex in 6 months per protocol; if stable at that time she can be followed annually.   Patient states she still has some very mild weakness of her right leg which affects the way she walks however feels that her right arm is back to baseline.  She denies any further strokelike symptoms including slurring speech, changes in vision, or one-sided weakness.    She denies any claudication type symptoms in her legs with walking.  She is receiving radiation tx for breast cancer, has finished chemotx.   She continues to take her aspirin and Plavix daily.  She states she went to her primary care doctor recently who said that she would be able to stop her statin.  She denies tobacco use.   Diabetic: yes Tobacco use: non-smoker  Pt meds include: Statin : yes ASA: yes Other anticoagulants/antiplatelets: Plavix   Past Medical History:  Diagnosis Date  . Breast cancer ([place])    right IDC  . Diabetes mellitus without complication ([place])    type 2  . Embolic stroke involving carotid artery ([place])   . GERD (gastroesophageal reflux disease)   . Heart murmur    no problems with it  . Hepatitis     in the 80's, took meds at the time   . Hypertension   . Peripheral vascular disease ([facility])    carotid artery blockage   . Stroke ([place]) 07/12/2017  . Vision abnormalities     Social History Social History   Tobacco Use  . Smoking status: Never Smoker  . Smokeless tobacco: Never Used  Substance Use Topics  . Alcohol use: No  . Drug use: No    Family History Family History  Problem Relation Age of Onset  . Diabetes Mellitus II Sister   . Stroke Mother   . Diabetes Mellitus II Mother   . Cancer Father   . Diabetes Mellitus II Brother   . Breast cancer Maternal Aunt     Surgical History Past Surgical History:  Procedure Laterality Date  . BREAST EXCISIONAL BIOPSY Right   . BREAST LUMPECTOMY Right 03/2018  . BREAST LUMPECTOMY WITH RADIOACTIVE SEED AND SENTINEL LYMPH NODE BIOPSY Right 03/24/2018   Procedure: RIGHT BREAST LUMPECTOMY WITH RADIOACTIVE SEED AND RIGHT AXILLARY DEEP SENTINEL LYMPH NODE BIOPSY INJECT BLUE DYE RIGHT BREAST;  Surgeon: Fanny Skates, MD;  Location: [facility];  Service: General;  Laterality: Right;  . COLONOSCOPY    . PORTACATH PLACEMENT N/A 04/09/2018   Procedure: INSERTION PORT-A-CATH WITH ULTRASOUND;  Surgeon: Fanny Skates, MD;  Location: [facility];  Service: General;  Laterality: N/A;  . TRANSCAROTID ARTERY REVASCULARIZATION Left 07/24/2017   Procedure: TRANSCAROTID ARTERY REVASCULARIZATION,  left;  Surgeon: Elam Dutch, MD;  Location: [facility];  Service: Vascular;  Laterality: Left;  . UTERINE FIBROID SURGERY     2005    No Known Allergies  Current Outpatient Medications  Medication Sig Dispense Refill  . amLODipine (NORVASC) 10 MG tablet TAKE 1 TABLET BY MOUTH DAILY. 30 tablet 2  . [START ON 09/05/2018] anastrozole (ARIMIDEX) 1 MG tablet Take 1 tablet (1 mg total) by mouth daily. 90 tablet 3  . aspirin 81 MG tablet Take 1 tablet (81 mg total) by mouth daily. 100 tablet 2  . blood glucose meter kit and  supplies KIT Dispense based on patient and insurance preference. Use up to four times daily as directed. (FOR ICD-9 250.00, 250.01). 1 each 0  . clopidogrel (PLAVIX) 75 MG tablet TAKE 1 TABLET BY MOUTH DAILY. 30 tablet 2  . Insulin Pen Needle (TRUEPLUS PEN NEEDLES) 32G X 4 MM MISC Use as directed to inject insulin 100 each 3  . LANTUS SOLOSTAR 100 UNIT/ML Solostar Pen Inject 7 Units into the skin 2 (two) times daily. 15 mL 6  . lidocaine-prilocaine (EMLA) cream APPLY 1 APPLICATION TOPICALLY AS NEEDED. 30 g 0  . lisinopril (PRINIVIL,ZESTRIL) 5 MG tablet Take 1 tablet (5 mg total) by mouth daily. 90 tablet 6  . metFORMIN (GLUCOPHAGE) 500 MG tablet TAKE 1 TABLET BY MOUTH 2 TIMES DAILY WITH A MEAL. 60 tablet 2  . rosuvastatin (CRESTOR) 40 MG tablet Take 40 mg by mouth daily.     No current facility-administered medications for this visit.     Review of Systems : See HPI for pertinent positives and negatives.  Physical Examination  Vitals:   08/21/18 0906  BP: (!) 146/71  Pulse: 74  Resp: 14  Temp: 97.9 F (36.6 C)  TempSrc: Temporal  SpO2: 98%  Weight: 182 lb (82.6 kg)  Height: 5' 5"  (1.651 m)   Body mass index is 30.29 kg/m.  General: WDWN obese female in NAD GAIT: normal Eyes: PERRLA HENT: No gross abnormalities.  Pulmonary:  Respirations are non-labored, good air movement in all fields, CTAB, no rales, rhonchi, or wheezing. Cardiac: regular rhythm, no detected murmur.  VASCULAR EXAM Carotid Bruits Right Left   Negative Negative     Abdominal aortic pulse is not palpable. Radial pulses are 2+ palpable and equal.                                                                                                                            LE Pulses Right Left       POPLITEAL  not palpable   not palpable       POSTERIOR TIBIAL  not palpable   not palpable        DORSALIS PEDIS      ANTERIOR TIBIAL 2+ palpable  2+ palpable     Gastrointestinal: soft, nontender, BS WNL,  no r/g, no palpable masses. Musculoskeletal: no muscle atrophy/wasting. M/S 5/5 throughout, extremities  without ischemic changes Skin: No rashes, no ulcers, no cellulitis.  Subcutaneous infusion port right upper chest.  Neurologic:  A&O X 3; appropriate affect, sensation is normal; speech is normal, CN 2-12 intact, pain and light touch intact in extremities, motor exam as listed above. Psychiatric: Normal thought content, mood appropriate to clinical situation.    DATA Carotid Duplex (08-21-18): Right Carotid: Velocities in the right ICA are consistent with a 1-39% stenosis.                Non-hemodynamically significant plaque <50% noted in the CCA. The                ECA appears <50% stenosed. Left Carotid: Non-hemodynamically significant plaque noted in the CCA. The ECA               appears <50% stenosed. Patent left common carotid artery to               internal carotid artery stent without evidence of restenosis. Vertebrals:  Bilateral vertebral arteries demonstrate antegrade flow. Subclavians: Normal flow hemodynamics were seen in bilateral subclavian arteries.   Assessment: Sandra Shelton is a 63 y.o. female who is status post left TCAR by Dr. Oneida Alar on 08/2017.  She has no residual neurological deficits. She has had no subsequent neurological events.   Carotid duplex today shows no stenosis of the left CCA and ICA, and 1-39% stenosis of the right ICA.  Fortunately she has never used tobacco. Her atherosclerotic risk factors include DM, obesity, and breast cancer.  Continue Plavix, ASA, and statin indefinitely.     Plan: Follow-up in 1 year with Carotid Duplex scan.   I discussed in depth with the patient the nature of atherosclerosis, and emphasized the importance of maximal medical management including strict control of blood pressure, blood glucose, and lipid levels, obtaining regular exercise, and continued cessation of smoking.  The patient is aware that without maximal  medical management the underlying atherosclerotic disease process will progress, limiting the benefit of any interventions. The patient was given information about stroke prevention and what symptoms should prompt the patient to seek immediate medical care. Thank you for allowing [place] to participate in this patient's care.  Clemon Chambers, RN, MSN, FNP-C [facility] Office: [phone]  Clinic Physician: Laqueta Due  08/21/18 9:26 AM

## 2018-08-22 ENCOUNTER — Ambulatory Visit
Admission: RE | Admit: 2018-08-22 | Discharge: 2018-08-22 | Disposition: A | Discharge status: Home / Self Care | Payer: Medicare Other | Source: Ambulatory Visit | Attending: Radiation Oncology | Admitting: Radiation Oncology

## 2018-08-22 ENCOUNTER — Encounter: Payer: Self-pay | Admitting: Radiation Oncology

## 2018-08-22 ENCOUNTER — Other Ambulatory Visit: Payer: Self-pay

## 2018-08-22 DIAGNOSIS — Z510 Encounter for antineoplastic radiation therapy: Secondary | ICD-10-CM | POA: Diagnosis not present

## 2018-08-22 DIAGNOSIS — Z170 Estrogen receptor positive status [ER+]: Secondary | ICD-10-CM | POA: Diagnosis not present

## 2018-08-22 DIAGNOSIS — C50411 Malignant neoplasm of upper-outer quadrant of right female breast: Secondary | ICD-10-CM | POA: Diagnosis not present

## 2018-08-25 ENCOUNTER — Telehealth: Payer: Self-pay | Admitting: Neurology

## 2018-08-25 NOTE — Telephone Encounter (Signed)
Received an email from [facility] advising the patient failed to meet compliance in the first 90 days of her machine.   "Patient failed to meet compliance in her initial 90 days. Setup date: 05/15/18, Equipment returned to [employer]

## 2018-08-26 ENCOUNTER — Other Ambulatory Visit: Payer: Self-pay | Admitting: Internal Medicine

## 2018-08-26 MED FILL — CLOPIDOGREL 75 MG TABLET: 75 | 30 days supply | Qty: 30 | Fill #2

## 2018-08-27 MED FILL — ROSUVASTATIN CALCIUM 40 MG: 40 | 30 days supply | Qty: 30 | Fill #0

## 2018-09-02 ENCOUNTER — Inpatient Hospital Stay: Payer: Medicare Other | Attending: Hematology and Oncology

## 2018-09-02 ENCOUNTER — Ambulatory Visit
Admission: RE | Admit: 2018-09-02 | Discharge: 2018-09-02 | Disposition: A | Discharge status: Home / Self Care | Payer: Medicare Other | Source: Ambulatory Visit | Attending: Radiation Oncology | Admitting: Radiation Oncology

## 2018-09-02 ENCOUNTER — Encounter: Payer: Self-pay | Admitting: Radiation Oncology

## 2018-09-02 ENCOUNTER — Other Ambulatory Visit: Payer: Self-pay

## 2018-09-02 VITALS — BP 135/73 | HR 94 | Temp 98.9°F | Resp 17 | Ht 65.0 in | Wt 188.8 lb

## 2018-09-02 DIAGNOSIS — Z5112 Encounter for antineoplastic immunotherapy: Secondary | ICD-10-CM | POA: Diagnosis not present

## 2018-09-02 DIAGNOSIS — C50411 Malignant neoplasm of upper-outer quadrant of right female breast: Secondary | ICD-10-CM | POA: Insufficient documentation

## 2018-09-02 DIAGNOSIS — Z170 Estrogen receptor positive status [ER+]: Secondary | ICD-10-CM | POA: Diagnosis not present

## 2018-09-02 DIAGNOSIS — Z510 Encounter for antineoplastic radiation therapy: Secondary | ICD-10-CM | POA: Diagnosis not present

## 2018-09-02 MED ORDER — TRASTUZUMAB-DKST CHEMO 150 MG IV SOLR
6.0000 mg/kg | Freq: Once | INTRAVENOUS | Status: AC
  Administered 2018-09-02: 525 mg via INTRAVENOUS
  Filled 2018-09-02: qty 25

## 2018-09-02 MED ORDER — DIPHENHYDRAMINE HCL 25 MG PO CAPS
ORAL_CAPSULE | ORAL | Status: AC
  Filled 2018-09-02: qty 2

## 2018-09-02 MED ORDER — SODIUM CHLORIDE 0.9 % IV SOLN
Freq: Once | INTRAVENOUS | Status: AC
  Administered 2018-09-02: 10:00:00 via INTRAVENOUS
  Filled 2018-09-02: qty 250

## 2018-09-02 MED ORDER — ACETAMINOPHEN 325 MG PO TABS
ORAL_TABLET | ORAL | Status: AC
  Filled 2018-09-02: qty 2

## 2018-09-02 MED ORDER — SODIUM CHLORIDE 0.9% FLUSH
10.0000 mL | INTRAVENOUS | Status: DC | PRN
  Administered 2018-09-02: 11:00:00 10 mL
  Filled 2018-09-02: qty 10

## 2018-09-02 MED ORDER — HEPARIN SOD (PORK) LOCK FLUSH 100 UNIT/ML IV SOLN
500.0000 [IU] | Freq: Once | INTRAVENOUS | Status: AC | PRN
  Administered 2018-09-02: 11:00:00 500 [IU]
  Filled 2018-09-02: qty 5

## 2018-09-02 MED ORDER — ACETAMINOPHEN 325 MG PO TABS
650.0000 mg | ORAL_TABLET | Freq: Once | ORAL | Status: AC
  Administered 2018-09-02: 650 mg via ORAL

## 2018-09-02 MED ORDER — DIPHENHYDRAMINE HCL 25 MG PO CAPS
50.0000 mg | ORAL_CAPSULE | Freq: Once | ORAL | Status: AC
  Administered 2018-09-02: 50 mg via ORAL

## 2018-09-02 NOTE — Patient Instructions (Signed)
Coronavirus (COVID-19) Are you at risk?  Are you at risk for the Coronavirus (COVID-19)?  To be considered HIGH RISK for Coronavirus (COVID-19), you have to meet the following criteria:  . Traveled to [place], [place], [place], [place] or [place]; or in the [place] to [place], [place], [place], or [place]; and have fever, cough, and shortness of breath within the last 2 weeks of travel OR . Been in close contact with a person diagnosed with COVID-19 within the last 2 weeks and have fever, cough, and shortness of breath . IF YOU DO NOT MEET THESE CRITERIA, YOU ARE CONSIDERED LOW RISK FOR COVID-19.  What to do if you are HIGH RISK for COVID-19?  . If you are having a medical emergency, call 911. . Seek medical care right away. Before you go to a doctor's office, urgent care or emergency department, call ahead and tell them about your recent travel, contact with someone diagnosed with COVID-19, and your symptoms. You should receive instructions from your physician's office regarding next steps of care.  . When you arrive at healthcare provider, tell the healthcare staff immediately you have returned from visiting [place], [place], [place], [place] or [place]; or traveled in the [place] to [place], [place], [place], or [place]; in the last two weeks or you have been in close contact with a person diagnosed with COVID-19 in the last 2 weeks.   . Tell the health care staff about your symptoms: fever, cough and shortness of breath. . After you have been seen by a medical provider, you will be either: o Tested for (COVID-19) and discharged home on quarantine except to seek medical care if symptoms worsen, and asked to  - Stay home and avoid contact with others until you get your results (4-5 days)  - Avoid travel on public transportation if possible (such as bus, train, or airplane) or o Sent to the Emergency Department by EMS for evaluation, COVID-19 testing, and possible  admission depending on your condition and test results.  What to do if you are LOW RISK for COVID-19?  Reduce your risk of any infection by using the same precautions used for avoiding the common cold or flu:  . Wash your hands often with soap and warm water for at least 20 seconds.  If soap and water are not readily available, use an alcohol-based hand sanitizer with at least 60% alcohol.  . If coughing or sneezing, cover your mouth and nose by coughing or sneezing into the elbow areas of your shirt or coat, into a tissue or into your sleeve (not your hands). . Avoid shaking hands with others and consider head nods or verbal greetings only. . Avoid touching your eyes, nose, or mouth with unwashed hands.  . Avoid close contact with people who are sick. . Avoid places or events with large numbers of people in one location, like concerts or sporting events. . Carefully consider travel plans you have or are making. . If you are planning any travel outside or inside the [place], visit the CDC's Travelers' Health webpage for the latest health notices. . If you have some symptoms but not all symptoms, continue to monitor at home and seek medical attention if your symptoms worsen. . If you are having a medical emergency, call 911.   ADDITIONAL HEALTHCARE OPTIONS FOR PATIENTS  [facility] / e-Visit: [email or web address]         [facility]: [phone]  [facility]: [phone]                   [facility]: [phone]   

## 2018-09-02 NOTE — Progress Notes (Signed)
Pt presents today for a work in skin check with Dr. Sondra Come. Pt endorses using skin care cream as directed. Pt continues to have pain in breast and is relieved by "half a pain pill". Breast is hyperpigmented and slightly swollen. Axilla is hyperpigmented with linear areas of slight dry desquamation.   Loma Sousa, RN BSN

## 2018-09-02 NOTE — Patient Instructions (Signed)
[facility] Discharge Instructions for Patients Receiving Chemotherapy  Today you received the following chemotherapy agents: Trastuzumab-dkst Sandra Shelton)  To help prevent nausea and vomiting after your treatment, we encourage you to take your nausea medication as directed.   If you develop nausea and vomiting that is not controlled by your nausea medication, call the clinic.   BELOW ARE SYMPTOMS THAT SHOULD BE REPORTED IMMEDIATELY:  *FEVER GREATER THAN 100.5 F  *CHILLS WITH OR WITHOUT FEVER  NAUSEA AND VOMITING THAT IS NOT CONTROLLED WITH YOUR NAUSEA MEDICATION  *UNUSUAL SHORTNESS OF BREATH  *UNUSUAL BRUISING OR BLEEDING  TENDERNESS IN MOUTH AND THROAT WITH OR WITHOUT PRESENCE OF ULCERS  *URINARY PROBLEMS  *BOWEL PROBLEMS  UNUSUAL RASH Items with * indicate a potential emergency and should be followed up as soon as possible.  Feel free to call the clinic should you have any questions or concerns. The clinic phone number is [phone].  Please show the [facility] at check-in to the Emergency Department and triage nurse.  Coronavirus (COVID-19) Are you at risk?  Are you at risk for the Coronavirus (COVID-19)?  To be considered HIGH RISK for Coronavirus (COVID-19), you have to meet the following criteria:  . Traveled to [place], [place], [place], [place] or [place]; or in the [place] to [place], [place], [place], or [place]; and have fever, cough, and shortness of breath within the last 2 weeks of travel OR . Been in close contact with a person diagnosed with COVID-19 within the last 2 weeks and have fever, cough, and shortness of breath . IF YOU DO NOT MEET THESE CRITERIA, YOU ARE CONSIDERED LOW RISK FOR COVID-19.  What to do if you are HIGH RISK for COVID-19?  Marland Kitchen If you are having a medical emergency, call 911. . Seek medical care right away. Before you go to a doctor's office, urgent care or emergency department, call ahead and tell them  about your recent travel, contact with someone diagnosed with COVID-19, and your symptoms. You should receive instructions from your physician's office regarding next steps of care.  . When you arrive at healthcare provider, tell the healthcare staff immediately you have returned from visiting [place], [place], [place], [place] or [place]; or traveled in the [place] to [employer], [place], [place], or [place]; in the last two weeks or you have been in close contact with a person diagnosed with COVID-19 in the last 2 weeks.   . Tell the health care staff about your symptoms: fever, cough and shortness of breath. . After you have been seen by a medical provider, you will be either: o Tested for (COVID-19) and discharged home on quarantine except to seek medical care if symptoms worsen, and asked to  - Stay home and avoid contact with others until you get your results (4-5 days)  - Avoid travel on public transportation if possible (such as bus, train, or airplane) or o Sent to the Emergency Department by EMS for evaluation, COVID-19 testing, and possible admission depending on your condition and test results.  What to do if you are LOW RISK for COVID-19?  Reduce your risk of any infection by using the same precautions used for avoiding the common cold or flu:  Marland Kitchen Wash your hands often with soap and warm water for at least 20 seconds.  If soap and water are not readily available, use an alcohol-based hand sanitizer with at least 60% alcohol.  . If coughing or  sneezing, cover your mouth and nose by coughing or sneezing into the elbow areas of your shirt or coat, into a tissue or into your sleeve (not your hands). . Avoid shaking hands with others and consider head nods or verbal greetings only. . Avoid touching your eyes, nose, or mouth with unwashed hands.  . Avoid close contact with people who are sick. . Avoid places or events with large numbers of people in one location, like concerts or  sporting events. . Carefully consider travel plans you have or are making. . If you are planning any travel outside or inside the [place], visit the CDC's Travelers' Health webpage for the latest health notices. . If you have some symptoms but not all symptoms, continue to monitor at home and seek medical attention if your symptoms worsen. . If you are having a medical emergency, call 911.   [facility] / e-Visit: [email or web address]         [facility]: [facility]: [phone]                   [facility]: [phone]

## 2018-09-02 NOTE — Progress Notes (Signed)
Radiation Oncology         [phone] ________________________________  Name: Sandra Shelton MRN: [id number]  Date: 09/02/2018  DOB: [birth year]  Follow-Up Visit Note  CC: [name], MD  [name], MD    ICD-10-CM   1. Malignant neoplasm of upper-outer quadrant of right breast in female, estrogen receptor positive ([place])  C50.411    Z17.0     Diagnosis: Stage IA (pT1c, pN0)rightBreast UOQ Invasive Ductal Carcinoma, ER+/ PR+/ Her2+, GradeII     Interval Since Last Radiation:  10 days  Narrative:  The patient returns today for close follow-up.  The patient developed some moist desquamation in the low axillary area.  She is receiving infusion in medical oncology today so she is seen for skin check the same time.                             ALLERGIES:  has No Known Allergies.  Meds: Current Outpatient Medications  Medication Sig Dispense Refill   amLODipine (NORVASC) 10 MG tablet TAKE 1 TABLET BY MOUTH DAILY. 30 tablet 2   [START ON 09/05/2018] anastrozole (ARIMIDEX) 1 MG tablet Take 1 tablet (1 mg total) by mouth daily. 90 tablet 3   aspirin 81 MG tablet Take 1 tablet (81 mg total) by mouth daily. 100 tablet 2   blood glucose meter kit and supplies KIT Dispense based on patient and insurance preference. Use up to four times daily as directed. (FOR ICD-9 250.00, 250.01). 1 each 0   clopidogrel (PLAVIX) 75 MG tablet TAKE 1 TABLET BY MOUTH DAILY. 30 tablet 2   Insulin Pen Needle (TRUEPLUS PEN NEEDLES) 32G X 4 MM MISC Use as directed to inject insulin 100 each 3   LANTUS SOLOSTAR 100 UNIT/ML Solostar Pen Inject 7 Units into the skin 2 (two) times daily. 15 mL 6   lidocaine-prilocaine (EMLA) cream APPLY 1 APPLICATION TOPICALLY AS NEEDED. 30 g 0   lisinopril (PRINIVIL,ZESTRIL) 5 MG tablet Take 1 tablet (5 mg total) by mouth daily. 90 tablet 6   metFORMIN (GLUCOPHAGE) 500 MG tablet TAKE 1 TABLET BY MOUTH 2 TIMES DAILY WITH A MEAL. 60 tablet 2   rosuvastatin  (CRESTOR) 40 MG tablet TAKE 1 TABLET BY MOUTH DAILY AT 6 PM. 30 tablet 6   No current facility-administered medications for this encounter.     Physical Findings: Lungs are clear.  The heart has a regular rhythm and rate.  The right breast area shows hyperpigmentation changes and some dry desquamation.  Small amount of moist desquamation in the axillary scar.  No signs of infection in the breast.  Lab Findings: Lab Results  Component Value Date   WBC 5.1 08/12/2018   HGB 11.0 (L) 08/12/2018   HCT 34.3 (L) 08/12/2018   MCV 88.4 08/12/2018   PLT 298 08/12/2018    Radiographic Findings: Vas US Carotid  Result Date: 08/21/2018 Carotid Arterial Duplex Study Indications:       Carotid artery disease and 07/24/2017: Transcarotid artery                    revascularization, left. Risk Factors:      Hypertension, hyperlipidemia, Diabetes, no history of                    smoking. Limitations:       Deep vessels Comparison Study:  02/20/2018 Performing Technologist: Caralee Ates BA, RVT, RDMS  Examination Guidelines:  A complete evaluation includes B-mode imaging, spectral Doppler, color Doppler, and power Doppler as needed of all accessible portions of each vessel. Bilateral testing is considered an integral part of a complete examination. Limited examinations for reoccurring indications may be performed as noted.  Right Carotid Findings: +----------+--------+--------+--------+-------------------+--------------------+             PSV cm/s EDV cm/s Stenosis Describe            Comments              +----------+--------+--------+--------+-------------------+--------------------+  CCA Prox   106      17       <50%     homogeneous and                                                                  smooth                                    +----------+--------+--------+--------+-------------------+--------------------+  CCA Mid    126      22       <50%     homogeneous and                                                                   smooth                                    +----------+--------+--------+--------+-------------------+--------------------+  CCA Distal 174      31       <50%     homogeneous and                                                                  smooth                                    +----------+--------+--------+--------+-------------------+--------------------+  ICA Prox   82       20       1-39%    homogeneous                               +----------+--------+--------+--------+-------------------+--------------------+  ICA Mid    51       16                                                          +----------+--------+--------+--------+-------------------+--------------------+  ICA Distal 85       30                                    Sub-optimal                                                                      visualization         +----------+--------+--------+--------+-------------------+--------------------+  ECA        188      21       <50%     heterogenous                              +----------+--------+--------+--------+-------------------+--------------------+ +----------+--------+-------+----------------+-------------------+             PSV cm/s EDV cms Describe         Arm Pressure (mmHG)  +----------+--------+-------+----------------+-------------------+  Subclavian 248              Multiphasic, WNL                      +----------+--------+-------+----------------+-------------------+ +---------+--------+--+--------+-+---------+  Vertebral PSV cm/s 36 EDV cm/s 9 Antegrade  +---------+--------+--+--------+-+---------+  Left Carotid Findings: +----------+--------+--------+--------+------------------+---------------------+             PSV cm/s EDV cm/s Stenosis Describe           Comments               +----------+--------+--------+--------+------------------+---------------------+  CCA Prox   103      21       <50%     homogeneous and                                                                   smooth                                    +----------+--------+--------+--------+------------------+---------------------+  CCA Mid    143      24       <50%     homogeneous and                                                                  smooth                                    +----------+--------+--------+--------+------------------+---------------------+  CCA Distal 144      30       <50%     homogeneous and  smooth                                    +----------+--------+--------+--------+------------------+---------------------+  ICA Distal 86       30                                                          +----------+--------+--------+--------+------------------+---------------------+  ECA        125      20       <50%                        Sub-optimal                                                                      visualization          +----------+--------+--------+--------+------------------+---------------------+ +----------+--------+--------+--------+-------------------+  Subclavian PSV cm/s EDV cm/s Describe Arm Pressure (mmHG)  +----------+--------+--------+--------+-------------------+             331               Stenotic                      +----------+--------+--------+--------+-------------------+ +---------+--------+--+--------+--+---------+  Vertebral PSV cm/s 92 EDV cm/s 27 Antegrade  +---------+--------+--+--------+--+---------+  Left Stent(s): +---------------+--------+--------+--------+--------+--------+  CCA to ICA      PSV cm/s EDV cm/s Stenosis Waveform Comments  +---------------+--------+--------+--------+--------+--------+  Prox to Stent   144      30                                   +---------------+--------+--------+--------+--------+--------+  Proximal Stent  86       16                                   +---------------+--------+--------+--------+--------+--------+  Mid Stent        90       10                                   +---------------+--------+--------+--------+--------+--------+  Distal Stent    74       23                                   +---------------+--------+--------+--------+--------+--------+  Distal to Stent 64       24                                   +---------------+--------+--------+--------+--------+--------+  Summary: Right Carotid: Velocities in the right ICA are consistent with a 1-39% stenosis.  Non-hemodynamically significant plaque <50% noted in the CCA. The                ECA appears <50% stenosed. Left Carotid: Non-hemodynamically significant plaque noted in the CCA. The ECA               appears <50% stenosed. Patent left common carotid artery to               internal carotid artery stent without evidence of restenosis. Vertebrals:  Bilateral vertebral arteries demonstrate antegrade flow. Subclavians: Normal flow hemodynamics were seen in bilateral subclavian              arteries. *See table(s) above for measurements and observations.  Electronically signed by [name] MD on 08/21/2018 at 4:11:42 PM.    Final     Impression:  The patient is recovering from the effects of radiation.  Recommended she use Neosporin ointment to the axillary scar area.  She will continue using her other creams throughout the remainder of the breast.  Plan: Routine follow-up in 2 weeks.  ____________________________________ Gery Pray, MD

## 2018-09-09 ENCOUNTER — Other Ambulatory Visit: Payer: Self-pay | Admitting: Internal Medicine

## 2018-09-09 DIAGNOSIS — I10 Essential (primary) hypertension: Secondary | ICD-10-CM

## 2018-09-09 DIAGNOSIS — E1159 Type 2 diabetes mellitus with other circulatory complications: Secondary | ICD-10-CM

## 2018-09-09 DIAGNOSIS — Z794 Long term (current) use of insulin: Secondary | ICD-10-CM

## 2018-09-09 MED FILL — metFORMIN HCL 500 MG TABS: 500 | 30 days supply | Qty: 60 | Fill #0

## 2018-09-09 MED FILL — AMLODIPINE BESYLATE 10 MG T: 10 | 30 days supply | Qty: 30 | Fill #0

## 2018-09-12 ENCOUNTER — Ambulatory Visit: Payer: Medicare Other | Attending: Internal Medicine | Admitting: Internal Medicine

## 2018-09-12 ENCOUNTER — Encounter: Payer: Self-pay | Admitting: Internal Medicine

## 2018-09-12 DIAGNOSIS — Z7189 Other specified counseling: Secondary | ICD-10-CM

## 2018-09-12 DIAGNOSIS — Z794 Long term (current) use of insulin: Secondary | ICD-10-CM

## 2018-09-12 DIAGNOSIS — I739 Peripheral vascular disease, unspecified: Secondary | ICD-10-CM

## 2018-09-12 DIAGNOSIS — Z8673 Personal history of transient ischemic attack (TIA), and cerebral infarction without residual deficits: Secondary | ICD-10-CM | POA: Diagnosis not present

## 2018-09-12 DIAGNOSIS — E1159 Type 2 diabetes mellitus with other circulatory complications: Secondary | ICD-10-CM

## 2018-09-12 DIAGNOSIS — Z853 Personal history of malignant neoplasm of breast: Secondary | ICD-10-CM

## 2018-09-12 DIAGNOSIS — I10 Essential (primary) hypertension: Secondary | ICD-10-CM | POA: Diagnosis not present

## 2018-09-12 DIAGNOSIS — I779 Disorder of arteries and arterioles, unspecified: Secondary | ICD-10-CM

## 2018-09-12 MED ORDER — AMLODIPINE BESYLATE 10 MG PO TABS: 10.0000 mg | ORAL_TABLET | Freq: Every day | ORAL | 2 refills | Status: DC

## 2018-09-12 MED ORDER — LISINOPRIL 5 MG PO TABS: 5.0000 mg | ORAL_TABLET | Freq: Every day | ORAL | 6 refills | Status: DC

## 2018-09-12 MED ORDER — METFORMIN HCL 500 MG PO TABS: 500.0000 mg | ORAL_TABLET | Freq: Two times a day (BID) | ORAL | 3 refills | Status: DC

## 2018-09-12 MED ORDER — LANTUS SOLOSTAR 100 UNIT/ML ~~LOC~~ SOPN: 7.0000 [IU] | PEN_INJECTOR | Freq: Two times a day (BID) | SUBCUTANEOUS | 6 refills | Status: DC

## 2018-09-12 MED ORDER — CLOPIDOGREL BISULFATE 75 MG PO TABS: 75.0000 mg | ORAL_TABLET | Freq: Every day | ORAL | 6 refills | Status: DC

## 2018-09-12 MED FILL — LANTUS SOLOSTAR 100 UNITS/M: 100 | 42 days supply | Qty: 6 | Fill #0

## 2018-09-12 NOTE — Progress Notes (Signed)
Pt states her blood sugar this morning was 137

## 2018-09-12 NOTE — Progress Notes (Signed)
Virtual Visit via Telephone Note Due to current restrictions/limitations of in-office visits due to the COVID-19 pandemic, this scheduled clinical appointment was converted to a telehealth visit  I connected with Sandra Shelton on 09/12/18 at 12:05 p.m by telephone and verified that I am speaking with the correct person using two identifiers. I am in my office.  The patient is at home.  Only the patient and myself participated in this encounter.  I discussed the limitations, risks, security and privacy concerns of performing an evaluation and management service by telephone and the availability of in person appointments. I also discussed with the patient that there may be a patient responsible charge related to this service. The patient expressed understanding and agreed to proceed.   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  Breast CA:  Finished XRT.  Still getting chemo and plan to be on antiestrogen therapy for 5-7 yrs.  DM: checking BS before BF and dinner.  BS before BF 94-125 and before dinner 112-140.  No low BS episodes Compliant with Metformin and Lantus BID Doing well with eating habits.  Portion sizes are smaller She walks in the mornings and does a little wgh lifting Had eye exam 1 mth ago at [facility].  No retinopathy.    HTN: she has a device but has not been diligent in checking. But BP at chemo always good.  Limits salt in foods. Compliant with Norvasc and Lisinopril  HL/CVA: compliant with Crestor, ASA and Plavix  Outpatient Encounter Medications as of 09/12/2018  Medication Sig  . amLODipine (NORVASC) 10 MG tablet Take 1 tablet (10 mg total) by mouth daily. Must have office visit for refills  . anastrozole (ARIMIDEX) 1 MG tablet Take 1 tablet (1 mg total) by mouth daily.  Marland Kitchen aspirin 81 MG tablet Take 1 tablet  (81 mg total) by mouth daily.  . blood glucose meter kit and supplies KIT Dispense based on patient and insurance preference. Use up to four times daily as directed. (FOR ICD-9 250.00, 250.01).  Marland Kitchen clopidogrel (PLAVIX) 75 MG tablet TAKE 1 TABLET BY MOUTH DAILY.  Marland Kitchen Insulin Pen Needle (TRUEPLUS PEN NEEDLES) 32G X 4 MM MISC Use as directed to inject insulin  . LANTUS SOLOSTAR 100 UNIT/ML Solostar Pen Inject 7 Units into the skin 2 (two) times daily.  Marland Kitchen lidocaine-prilocaine (EMLA) cream APPLY 1 APPLICATION TOPICALLY AS NEEDED.  Marland Kitchen lisinopril (PRINIVIL,ZESTRIL) 5 MG tablet Take 1 tablet (5 mg total) by mouth daily.  . metFORMIN (GLUCOPHAGE) 500 MG tablet Take 1 tablet (500 mg total) by mouth 2 (two) times daily with a meal. Must have office visit for refills  . rosuvastatin (CRESTOR) 40 MG tablet TAKE 1 TABLET BY MOUTH DAILY AT 6 PM.   No facility-administered encounter medications on file as of 09/12/2018.       Observations/Objective: No direct observation done as this was a telephone encounter.  Assessment and Plan: 1. Controlled type 2 diabetes mellitus with other circulatory complication, with long-term current use of insulin ([place]) Reported blood sugars are at goal.  Encourage her to continue healthy eating habits, regular exercise and compliance with medication. Recommend she inquires from her oncologist whether it is worth her taking the flu shot given that she is actively receiving chemotherapy - LANTUS SOLOSTAR 100 UNIT/ML Solostar Pen; Inject 7 Units into the skin 2 (two) times daily.  Dispense: 15 mL;  Refill: 6 - metFORMIN (GLUCOPHAGE) 500 MG tablet; Take 1 tablet (500 mg total) by mouth 2 (two) times daily with a meal.  Dispense: 180 tablet; Refill: 3  2. Essential hypertension Patient to continue taking current medications.  Advised blood pressure goal is 130/80 or lower so that when she gets a blood pressure check and chemotherapy she is able to gauge whether blood pressure is  controlled - amLODipine (NORVASC) 10 MG tablet; Take 1 tablet (10 mg total) by mouth daily.  Dispense: 90 tablet; Refill: 2 - lisinopril (ZESTRIL) 5 MG tablet; Take 1 tablet (5 mg total) by mouth daily.  Dispense: 90 tablet; Refill: 6  3. Left-sided carotid artery disease, unspecified type ([place]) - clopidogrel (PLAVIX) 75 MG tablet; Take 1 tablet (75 mg total) by mouth daily.  Dispense: 30 tablet; Refill: 6  4. History of CVA (cerebrovascular accident) - clopidogrel (PLAVIX) 75 MG tablet; Take 1 tablet (75 mg total) by mouth daily.  Dispense: 30 tablet; Refill: 6  5. Educated About Covid-19 Virus Infection Advised wearing mask when in public, maintaining social distancing and good handwashing to prevent getting COVID-19  6. History of right breast cancer Followed by oncology.  She is still actively receiving treatment.   Follow Up Instructions: 3 mths   I discussed the assessment and treatment plan with the patient. The patient was provided an opportunity to ask questions and all were answered. The patient agreed with the plan and demonstrated an understanding of the instructions.   The patient was advised to call back or seek an in-person evaluation if the symptoms worsen or if the condition fails to improve as anticipated.  I provided 11 minutes of non-face-to-face time during this encounter.   [name], MD

## 2018-09-17 NOTE — Progress Notes (Incomplete)
Patient Name: Sandra Shelton MRN: [id number] DOB: 1955/07/26 Referring Physician: [name] Date of Service: 08/22/2018 [facility], [place]                                                        End Of Treatment Note  Diagnoses: C50.411-Malignant neoplasm of upper-outer quadrant of right female breast  Cancer Staging: Stage IA(pT1c, pN0), ER+/ PR+/ Her2+, GradeII  Intent: Curative  Radiation Treatment Dates: 07/28/2018 through 08/22/2018 Site Technique Total Dose Dose per Fx Completed Fx Beam Energies  Breast: Breast_Rt 3D 40.05/40.05 2.67 15/15 10X, 15X  Breast: Breast_Rt_Bst 3D 10/10 2 5/5 10X, 15X   Narrative: The patient tolerated radiation therapy relatively well. She denied fatigue throughout treatment. By the end of treatment, she reported soreness to the breast. She was noted to have hyperpigmentation without skin breakdown.  Plan: The patient will follow-up with radiation oncology in 1 month.  ________________________________________________   Blair Promise, PhD, MD  This document serves as a record of services personally performed by Gery Pray, MD. It was created on his behalf by Wilburn Mylar, a trained medical scribe. The creation of this record is based on the scribe's personal observations and the provider's statements to them. This document has been checked and approved by the attending provider.

## 2018-09-21 NOTE — Progress Notes (Signed)
Patient Care Team: [name], MD as PCP - General (Internal Medicine) Fanny Skates, MD as Consulting Physician (General Surgery) Nicholas Lose, MD as Consulting Physician (Hematology and Oncology) Gery Pray, MD as Consulting Physician (Radiation Oncology)  DIAGNOSIS:    ICD-10-CM   1. Malignant neoplasm of upper-outer quadrant of right breast in female, estrogen receptor positive ([place])  C50.411    Z17.0     SUMMARY OF ONCOLOGIC HISTORY: Oncology History  Malignant neoplasm of upper-outer quadrant of right breast in female, estrogen receptor positive ([place])  01/27/2018 Mammogram   Screening Mammogram  Possible right breast mass; asymmetry in left breast.   02/10/2018 Initial Diagnosis   Screening mammogram detected right breast mass and asymmetry in the left breast.  The left breast asymmetry resolved.  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%, HER-2 +3+ by IHC, T1BN0 stage Ia clinical stage   03/24/2018 Surgery   Lumpectomy and SLNB: IDC, 1.2cm, grade 2, margins negative, 4 SLN negative, T1cN0, previously ER/PR/HER-2 positive with Ki-67 of 20%   03/24/2018 Cancer Staging   Staging form: Breast, AJCC 8th Edition - Pathologic stage from 03/24/2018: Stage IA (pT1c, pN0, cM0, G2, ER+, PR+, HER2+) - Signed by Gardenia Phlegm, NP on 03/26/2018   04/22/2018 - 06/24/2018 Chemotherapy   The patient had trastuzumab (HERCEPTIN) 336 mg in sodium chloride 0.9 % 250 mL chemo infusion, 4 mg/kg = 336 mg, Intravenous,  Once, 3 of 16 cycles Administration: 336 mg (04/22/2018), 168 mg (04/29/2018), 168 mg (05/21/2018), 168 mg (05/06/2018), 168 mg (05/13/2018), 168 mg (05/27/2018), 168 mg (06/03/2018), 168 mg (06/10/2018), 168 mg (06/17/2018), 168 mg (06/24/2018) PACLitaxel (TAXOL) 156 mg in sodium chloride 0.9 % 250 mL chemo infusion (</= 81m/m2), 80 mg/m2 = 156 mg, Intravenous,  Once,  3 of 3 cycles Administration: 156 mg (04/22/2018), 156 mg (04/29/2018), 156 mg (05/21/2018), 156 mg (05/06/2018), 156 mg (05/13/2018), 156 mg (05/27/2018), 156 mg (06/03/2018), 156 mg (06/10/2018), 156 mg (06/17/2018), 156 mg (06/24/2018)  for chemotherapy treatment.    07/01/2018 -  Chemotherapy   The patient had trastuzumab (HERCEPTIN) 525 mg in sodium chloride 0.9 % 250 mL chemo infusion, 6 mg/kg = 525 mg (100 % of original dose 6 mg/kg), Intravenous,  Once, 3 of 3 cycles Dose modification: 6 mg/kg (original dose 6 mg/kg, Cycle 1, Reason: Provider Judgment) Administration: 525 mg (07/01/2018), 525 mg (07/22/2018), 525 mg (08/12/2018) trastuzumab-dkst (OGIVRI) 525 mg in sodium chloride 0.9 % 250 mL chemo infusion, 6 mg/kg = 525 mg (100 % of original dose 6 mg/kg), Intravenous,  Once, 1 of 2 cycles Dose modification: 6 mg/kg (original dose 6 mg/kg, Cycle 4, Reason: Other (see comments), Comment: Biosimilar Conversion) Administration: 525 mg (09/02/2018)  for chemotherapy treatment.    07/29/2018 - 08/22/2018 Radiation Therapy   Adjuvant XRT   09/05/2018 -  Anti-estrogen oral therapy   Anastrozole 155mdaily, plan for 5-7 years      CHIEF COMPLIANT: [name]  INTERVAL HISTORY: Sandra Shelton a [age].o. with above-mentioned history of right breast cancer who underwent a lumpectomy, adjuvant chemotherapy and radiation. She is currently on [name] and anti-estrogen therapy with anastrozole. She presents to the clinic today for treatment.   REVIEW OF SYSTEMS:   Constitutional: Denies fevers, chills or abnormal weight loss Eyes: Denies blurriness of vision Ears, nose, mouth, throat, and face: Denies mucositis or sore throat Respiratory:  Denies cough, dyspnea or wheezes Cardiovascular: Denies palpitation, chest discomfort Gastrointestinal: Denies nausea, heartburn or change in bowel habits Skin: Denies abnormal skin rashes Lymphatics: Denies new lymphadenopathy or easy bruising  Neurological: Denies numbness, tingling or new weaknesses Behavioral/Psych: Mood is stable, no new changes  Extremities: No lower extremity edema Breast: denies any pain or lumps or nodules in either breasts All other systems were reviewed with the patient and are negative.  I have reviewed the past medical history, past surgical history, social history and family history with the patient and they are unchanged from previous note.  ALLERGIES:  has No Known Allergies.  MEDICATIONS:  Current Outpatient Medications  Medication Sig Dispense Refill  . amLODipine (NORVASC) 10 MG tablet Take 1 tablet (10 mg total) by mouth daily. 90 tablet 2  . anastrozole (ARIMIDEX) 1 MG tablet Take 1 tablet (1 mg total) by mouth daily. 90 tablet 3  . aspirin 81 MG tablet Take 1 tablet (81 mg total) by mouth daily. 100 tablet 2  . blood glucose meter kit and supplies KIT Dispense based on patient and insurance preference. Use up to four times daily as directed. (FOR ICD-9 250.00, 250.01). 1 each 0  . clopidogrel (PLAVIX) 75 MG tablet Take 1 tablet (75 mg total) by mouth daily. 30 tablet 6  . Insulin Pen Needle (TRUEPLUS PEN NEEDLES) 32G X 4 MM MISC Use as directed to inject insulin 100 each 3  . LANTUS SOLOSTAR 100 UNIT/ML Solostar Pen Inject 7 Units into the skin 2 (two) times daily. 15 mL 6  . lidocaine-prilocaine (EMLA) cream APPLY 1 APPLICATION TOPICALLY AS NEEDED. 30 g 0  . lisinopril (ZESTRIL) 5 MG tablet Take 1 tablet (5 mg total) by mouth daily. 90 tablet 6  . metFORMIN (GLUCOPHAGE) 500 MG tablet Take 1 tablet (500 mg total) by mouth 2 (two) times daily with a meal. 180 tablet 3  . rosuvastatin (CRESTOR) 40 MG tablet TAKE 1 TABLET BY MOUTH DAILY AT 6 PM. 30 tablet 6   No current facility-administered medications for this visit.     PHYSICAL EXAMINATION: ECOG PERFORMANCE STATUS: 0 - Asymptomatic  Vitals:   09/23/18 0846  BP: (!) 149/56  Pulse: 82  Resp: 18  Temp: 98.5 F (36.9 C)  SpO2: 100%    Filed Weights   09/23/18 0846  Weight: 190 lb 1.6 oz (86.2 kg)    GENERAL: alert, no distress and comfortable SKIN: skin color, texture, turgor are normal, no rashes or significant lesions EYES: normal, Conjunctiva are pink and non-injected, sclera clear OROPHARYNX: no exudate, no erythema and lips, buccal mucosa, and tongue normal  NECK: supple, thyroid normal size, non-tender, without nodularity LYMPH: no palpable lymphadenopathy in the cervical, axillary or inguinal LUNGS: clear to auscultation and percussion with normal breathing effort HEART: regular rate & rhythm and no murmurs and no lower extremity edema ABDOMEN: abdomen soft, non-tender and normal bowel sounds MUSCULOSKELETAL: no cyanosis of digits and no clubbing  NEURO: alert & oriented x 3 with fluent speech, no focal motor/sensory deficits EXTREMITIES: No lower extremity edema  LABORATORY DATA:  I have reviewed the data as listed CMP Latest Ref Rng & Units 08/12/2018 07/01/2018 06/24/2018  Glucose 70 - 99 mg/dL 226(H) 162(H) 161(H)  BUN 8 - 23 mg/dL _0 Creatinine 0.44 - 1.00 mg/dL 0.93 0.86 0.88  Sodium 135 - 145 mmol/L 141 139 141  Potassium 3.5 - 5.1 mmol/L 3.9 3.9 4.0  Chloride 98 - 111 mmol/L 109 106 111  CO2 22 - 32 mmol/L _0 Calcium 8.9 - 10.3 mg/dL 9.7 9.6 9.9  Total Protein 6.5 - 8.1 g/dL 7.1 7.4 7.2  Total Bilirubin 0.3 - 1.2 mg/dL <0.2(L) <0.2(L) <0.2(L)  Alkaline Phos 38 - 126 U/L 48 49 41  AST 15 - 41 U/L _1 ALT 0 - 44 U/L _2 Lab Results  Component Value Date   WBC 4.5 09/23/2018   HGB 12.0 09/23/2018   HCT 36.6 09/23/2018   MCV 84.5 09/23/2018   PLT 280 09/23/2018   NEUTROABS 2.8 09/23/2018    ASSESSMENT & PLAN:  Malignant neoplasm of upper-outer quadrant of right breast in female, estrogen receptor positive ([place]) 02/04/2018:Screening mammogram detected right breast mass and asymmetry in the left breast. The left breast asymmetry resolved. Right breast mass outer  central 9 o'clock position measured 8 mm, axilla negative, ultrasound biopsy revealed grade 2 IDC with DCIS ER 100%, PR 70%, Ki-67 20%, HER-2 +3+ by IHC, T1BN0 stage Ia clinical stage  03/24/2018: Rt BCS: Grade 2 IDC 1.2 cm ER 100%, PR 70%, Ki-67 20%, HER-2 +3+ by IHC, T1BN0 stage Ia Left BCS: DCIS  Treatment plan: 1. Adj chemo with Taxol Herceptin weekly X 11 (stopped for peripheral neuropathy) started 04/22/2018-07/01/2018, now on [name] to complete 1 full year of therapy 2. Adjuvant radiation therapy completed 08/22/2018 3. Adjuvant antiestrogen therapy starting 09/23/2018 ------------------------------------------------------------------------------------------------------------------------ Current treatment: [name] therapy every 3 weeks, anastrozole 1 mg daily started 09/05/2018 Chemo-induced peripheral neuropathy: Stable  Anastrozole toxicities: I sent the prescription previously but apparently she did not get it.  So I resent it again today.  She will start taking anastrozole so she receives it.  Patient is not experiencing any side effects to Herceptin. Return to clinic every 3 weeks for Herceptin every 6 weeks of follow-up with me.    No orders of the defined types were placed in this encounter.  The patient has a good understanding of the overall plan. she agrees with it. she will call with any problems that may develop before the next visit here.  Nicholas Lose, MD 09/23/2018  Julious Oka Dorshimer am acting as scribe for Dr. Nicholas Lose.  I have reviewed the above documentation for accuracy and completeness, and I agree with the above.

## 2018-09-23 ENCOUNTER — Encounter: Payer: Self-pay | Admitting: Radiation Oncology

## 2018-09-23 ENCOUNTER — Inpatient Hospital Stay: Payer: Medicare Other

## 2018-09-23 ENCOUNTER — Inpatient Hospital Stay (HOSPITAL_BASED_OUTPATIENT_CLINIC_OR_DEPARTMENT_OTHER): Payer: Medicare Other | Admitting: Hematology and Oncology

## 2018-09-23 ENCOUNTER — Other Ambulatory Visit: Payer: Self-pay

## 2018-09-23 ENCOUNTER — Encounter: Payer: Self-pay | Admitting: *Deleted

## 2018-09-23 ENCOUNTER — Ambulatory Visit: Payer: Self-pay | Admitting: Radiation Oncology

## 2018-09-23 ENCOUNTER — Ambulatory Visit
Admission: RE | Admit: 2018-09-23 | Discharge: 2018-09-23 | Disposition: A | Discharge status: Home / Self Care | Payer: Medicare Other | Source: Ambulatory Visit | Attending: Radiation Oncology | Admitting: Radiation Oncology

## 2018-09-23 ENCOUNTER — Inpatient Hospital Stay: Payer: Medicare Other | Attending: Hematology and Oncology

## 2018-09-23 VITALS — BP 149/56 | HR 82 | Temp 98.5°F | Resp 18 | Ht 65.0 in | Wt 190.1 lb

## 2018-09-23 DIAGNOSIS — Z794 Long term (current) use of insulin: Secondary | ICD-10-CM | POA: Insufficient documentation

## 2018-09-23 DIAGNOSIS — Z170 Estrogen receptor positive status [ER+]: Secondary | ICD-10-CM | POA: Diagnosis not present

## 2018-09-23 DIAGNOSIS — C50411 Malignant neoplasm of upper-outer quadrant of right female breast: Secondary | ICD-10-CM | POA: Insufficient documentation

## 2018-09-23 DIAGNOSIS — Z7982 Long term (current) use of aspirin: Secondary | ICD-10-CM | POA: Insufficient documentation

## 2018-09-23 DIAGNOSIS — Z79899 Other long term (current) drug therapy: Secondary | ICD-10-CM | POA: Insufficient documentation

## 2018-09-23 DIAGNOSIS — Z923 Personal history of irradiation: Secondary | ICD-10-CM | POA: Insufficient documentation

## 2018-09-23 DIAGNOSIS — Z79811 Long term (current) use of aromatase inhibitors: Secondary | ICD-10-CM | POA: Insufficient documentation

## 2018-09-23 DIAGNOSIS — Z9221 Personal history of antineoplastic chemotherapy: Secondary | ICD-10-CM | POA: Insufficient documentation

## 2018-09-23 DIAGNOSIS — C50111 Malignant neoplasm of central portion of right female breast: Secondary | ICD-10-CM

## 2018-09-23 DIAGNOSIS — Z95828 Presence of other vascular implants and grafts: Secondary | ICD-10-CM

## 2018-09-23 DIAGNOSIS — Z5112 Encounter for antineoplastic immunotherapy: Secondary | ICD-10-CM | POA: Diagnosis not present

## 2018-09-23 DIAGNOSIS — I6330 Cerebral infarction due to thrombosis of unspecified cerebral artery: Secondary | ICD-10-CM

## 2018-09-23 LAB — CMP (CANCER CENTER ONLY)
ALT: 30 U/L (ref 0–44)
AST: 23 U/L (ref 15–41)
Albumin: 4 g/dL (ref 3.5–5.0)
Alkaline Phosphatase: 54 U/L (ref 38–126)
Anion gap: 8 (ref 5–15)
BUN: 15 mg/dL (ref 8–23)
CO2: 24 mmol/L (ref 22–32)
Calcium: 9.8 mg/dL (ref 8.9–10.3)
Chloride: 109 mmol/L (ref 98–111)
Creatinine: 0.83 mg/dL (ref 0.44–1.00)
GFR, Est AFR Am: >60 mL/min (ref >60)
GFR, Estimated: >60 mL/min (ref >60)
Glucose, Bld: 163 mg/dL — ABNORMAL HIGH (ref 70–99)
Potassium: 4 mmol/L (ref 3.5–5.1)
Sodium: 141 mmol/L (ref 135–145)
Total Bilirubin: <0.2 mg/dL — ABNORMAL LOW (ref 0.3–1.2)
Total Protein: 7.5 g/dL (ref 6.5–8.1)

## 2018-09-23 LAB — CBC WITH DIFFERENTIAL (CANCER CENTER ONLY)
Abs Immature Granulocytes: 0 10*3/uL (ref 0.00–0.07)
Basophils Absolute: 0 10*3/uL (ref 0.0–0.1)
Basophils Relative: 0 %
Eosinophils Absolute: 0.1 10*3/uL (ref 0.0–0.5)
Eosinophils Relative: 2 %
HCT: 36.6 % (ref 36.0–46.0)
Hemoglobin: 12 g/dL (ref 12.0–15.0)
Immature Granulocytes: 0 %
Lymphocytes Relative: 27 %
Lymphs Abs: 1.2 10*3/uL (ref 0.7–4.0)
MCH: 27.7 pg (ref 26.0–34.0)
MCHC: 32.8 g/dL (ref 30.0–36.0)
MCV: 84.5 fL (ref 80.0–100.0)
Monocytes Absolute: 0.4 10*3/uL (ref 0.1–1.0)
Monocytes Relative: 10 %
Neutro Abs: 2.8 10*3/uL (ref 1.7–7.7)
Neutrophils Relative %: 61 %
Platelet Count: 280 10*3/uL (ref 150–400)
RBC: 4.33 MIL/uL (ref 3.87–5.11)
RDW: 12.6 % (ref 11.5–15.5)
WBC Count: 4.5 10*3/uL (ref 4.0–10.5)
nRBC: 0 % (ref 0.0–0.2)

## 2018-09-23 MED ORDER — DIPHENHYDRAMINE HCL 25 MG PO CAPS
50.0000 mg | ORAL_CAPSULE | Freq: Once | ORAL | Status: AC
  Administered 2018-09-23: 50 mg via ORAL

## 2018-09-23 MED ORDER — ACETAMINOPHEN 325 MG PO TABS
650.0000 mg | ORAL_TABLET | Freq: Once | ORAL | Status: AC
  Administered 2018-09-23: 650 mg via ORAL

## 2018-09-23 MED ORDER — SODIUM CHLORIDE 0.9% FLUSH
10.0000 mL | INTRAVENOUS | Status: DC | PRN
  Administered 2018-09-23: 10 mL
  Filled 2018-09-23: qty 10

## 2018-09-23 MED ORDER — ANASTROZOLE 1 MG PO TABS: 1.0000 mg | ORAL_TABLET | Freq: Every day | ORAL | 3 refills | Status: DC

## 2018-09-23 MED ORDER — SODIUM CHLORIDE 0.9% FLUSH
10.0000 mL | Freq: Once | INTRAVENOUS | Status: AC
  Administered 2018-09-23: 09:00:00 10 mL
  Filled 2018-09-23: qty 10

## 2018-09-23 MED ORDER — ACETAMINOPHEN 325 MG PO TABS
ORAL_TABLET | ORAL | Status: AC
  Filled 2018-09-23: qty 2

## 2018-09-23 MED ORDER — HEPARIN SOD (PORK) LOCK FLUSH 100 UNIT/ML IV SOLN
500.0000 [IU] | Freq: Once | INTRAVENOUS | Status: AC | PRN
  Administered 2018-09-23: 11:00:00 500 [IU]
  Filled 2018-09-23: qty 5

## 2018-09-23 MED ORDER — TRASTUZUMAB-DKST CHEMO 150 MG IV SOLR
6.0000 mg/kg | Freq: Once | INTRAVENOUS | Status: AC
  Administered 2018-09-23: 525 mg via INTRAVENOUS
  Filled 2018-09-23: qty 25

## 2018-09-23 MED ORDER — SODIUM CHLORIDE 0.9 % IV SOLN
Freq: Once | INTRAVENOUS | Status: AC
  Administered 2018-09-23: 09:00:00 via INTRAVENOUS
  Filled 2018-09-23: qty 250

## 2018-09-23 MED ORDER — DIPHENHYDRAMINE HCL 25 MG PO CAPS
ORAL_CAPSULE | ORAL | Status: AC
  Filled 2018-09-23: qty 2

## 2018-09-23 MED FILL — ANASTROZOLE 1 MG TABLET: 1 | 30 days supply | Qty: 30 | Fill #0

## 2018-09-23 NOTE — Patient Instructions (Signed)
Coronavirus (COVID-19) Are you at risk?  Are you at risk for the Coronavirus (COVID-19)?  To be considered HIGH RISK for Coronavirus (COVID-19), you have to meet the following criteria:  . Traveled to [place], [place], [place], [place] or [place]; or in the [place] to [place], [place], [place], or [place]; and have fever, cough, and shortness of breath within the last 2 weeks of travel OR . Been in close contact with a person diagnosed with COVID-19 within the last 2 weeks and have fever, cough, and shortness of breath . IF YOU DO NOT MEET THESE CRITERIA, YOU ARE CONSIDERED LOW RISK FOR COVID-19.  What to do if you are HIGH RISK for COVID-19?  . If you are having a medical emergency, call 911. . Seek medical care right away. Before you go to a doctor's office, urgent care or emergency department, call ahead and tell them about your recent travel, contact with someone diagnosed with COVID-19, and your symptoms. You should receive instructions from your physician's office regarding next steps of care.  . When you arrive at healthcare provider, tell the healthcare staff immediately you have returned from visiting [place], [place], [place], [place] or [place]; or traveled in the [place] to [place], [place], [place], or [place]; in the last two weeks or you have been in close contact with a person diagnosed with COVID-19 in the last 2 weeks.   . Tell the health care staff about your symptoms: fever, cough and shortness of breath. . After you have been seen by a medical provider, you will be either: o Tested for (COVID-19) and discharged home on quarantine except to seek medical care if symptoms worsen, and asked to  - Stay home and avoid contact with others until you get your results (4-5 days)  - Avoid travel on public transportation if possible (such as bus, train, or airplane) or o Sent to the Emergency Department by EMS for evaluation, COVID-19 testing, and possible  admission depending on your condition and test results.  What to do if you are LOW RISK for COVID-19?  Reduce your risk of any infection by using the same precautions used for avoiding the common cold or flu:  . Wash your hands often with soap and warm water for at least 20 seconds.  If soap and water are not readily available, use an alcohol-based hand sanitizer with at least 60% alcohol.  . If coughing or sneezing, cover your mouth and nose by coughing or sneezing into the elbow areas of your shirt or coat, into a tissue or into your sleeve (not your hands). . Avoid shaking hands with others and consider head nods or verbal greetings only. . Avoid touching your eyes, nose, or mouth with unwashed hands.  . Avoid close contact with people who are sick. . Avoid places or events with large numbers of people in one location, like concerts or sporting events. . Carefully consider travel plans you have or are making. . If you are planning any travel outside or inside the [place], visit the CDC's Travelers' Health webpage for the latest health notices. . If you have some symptoms but not all symptoms, continue to monitor at home and seek medical attention if your symptoms worsen. . If you are having a medical emergency, call 911.   ADDITIONAL HEALTHCARE OPTIONS FOR PATIENTS  [facility] / e-Visit: [email or web address]         [facility]: [phone]  [facility]: [phone]                   [facility]: [phone]   

## 2018-09-23 NOTE — Assessment & Plan Note (Signed)
02/04/2018:Screening mammogram detected right breast mass and asymmetry in the left breast. The left breast asymmetry resolved. 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 Adj chemo with Taxol Herceptin weekly X 11 (stopped for peripheral neuropathy) started 04/22/2018-07/01/2018, now on [name] to complete 1 full year of therapy 2. Adjuvant radiation therapy completed 08/22/2018 3. Adjuvant antiestrogen therapy started 09/05/2018 ------------------------------------------------------------------------------------------------------------------------ Current treatment: [name] therapy every 3 weeks, anastrozole 1 mg daily started 09/05/2018 Chemo-induced peripheral neuropathy: Stable  Anastrozole toxicities:  Patient is not experiencing any side effects to Herceptin. Return to clinic every 3 weeks for Herceptin every 6 weeks of follow-up with me.

## 2018-09-23 NOTE — Patient Instructions (Signed)
[facility] Discharge Instructions for Patients Receiving Chemotherapy  Today you received the following chemotherapy agents Trastuzumab-dkst  To help prevent nausea and vomiting after your treatment, we encourage you to take your nausea medication as directed by your MD.   If you develop nausea and vomiting that is not controlled by your nausea medication, call the clinic.   BELOW ARE SYMPTOMS THAT SHOULD BE REPORTED IMMEDIATELY:  *FEVER GREATER THAN 100.5 F  *CHILLS WITH OR WITHOUT FEVER  NAUSEA AND VOMITING THAT IS NOT CONTROLLED WITH YOUR NAUSEA MEDICATION  *UNUSUAL SHORTNESS OF BREATH  *UNUSUAL BRUISING OR BLEEDING  TENDERNESS IN MOUTH AND THROAT WITH OR WITHOUT PRESENCE OF ULCERS  *URINARY PROBLEMS  *BOWEL PROBLEMS  UNUSUAL RASH Items with * indicate a potential emergency and should be followed up as soon as possible.  Feel free to call the clinic should you have any questions or concerns. The clinic phone number is [phone].  Please show the [facility] at check-in to the Emergency Department and triage nurse.  Coronavirus (COVID-19) Are you at risk?  Are you at risk for the Coronavirus (COVID-19)?  To be considered HIGH RISK for Coronavirus (COVID-19), you have to meet the following criteria:  . Traveled to [place], [place], [place], [place] or [place]; or in the [place] to [place], [place], [place], or [place]; and have fever, cough, and shortness of breath within the last 2 weeks of travel OR . Been in close contact with a person diagnosed with COVID-19 within the last 2 weeks and have fever, cough, and shortness of breath . IF YOU DO NOT MEET THESE CRITERIA, YOU ARE CONSIDERED LOW RISK FOR COVID-19.  What to do if you are HIGH RISK for COVID-19?  Marland Kitchen If you are having a medical emergency, call 911. . Seek medical care right away. Before you go to a doctor's office, urgent care or emergency department, call ahead and tell  them about your recent travel, contact with someone diagnosed with COVID-19, and your symptoms. You should receive instructions from your physician's office regarding next steps of care.  . When you arrive at healthcare provider, tell the healthcare staff immediately you have returned from visiting [place], [place], [place], [place] or [place]; or traveled in the [place] to [place], [place], [place], or [place]; in the last two weeks or you have been in close contact with a person diagnosed with COVID-19 in the last 2 weeks.   . Tell the health care staff about your symptoms: fever, cough and shortness of breath. . After you have been seen by a medical provider, you will be either: o Tested for (COVID-19) and discharged home on quarantine except to seek medical care if symptoms worsen, and asked to  - Stay home and avoid contact with others until you get your results (4-5 days)  - Avoid travel on public transportation if possible (such as bus, train, or airplane) or o Sent to the Emergency Department by EMS for evaluation, COVID-19 testing, and possible admission depending on your condition and test results.  What to do if you are LOW RISK for COVID-19?  Reduce your risk of any infection by using the same precautions used for avoiding the common cold or flu:  Marland Kitchen Wash your hands often with soap and warm water for at least 20 seconds.  If soap and water are not readily available, use an alcohol-based hand sanitizer with at least 60% alcohol.  . If  coughing or sneezing, cover your mouth and nose by coughing or sneezing into the elbow areas of your shirt or coat, into a tissue or into your sleeve (not your hands). . Avoid shaking hands with others and consider head nods or verbal greetings only. . Avoid touching your eyes, nose, or mouth with unwashed hands.  . Avoid close contact with people who are sick. . Avoid places or events with large numbers of people in one location, like concerts or  sporting events. . Carefully consider travel plans you have or are making. . If you are planning any travel outside or inside the [place], visit the CDC's Travelers' Health webpage for the latest health notices. . If you have some symptoms but not all symptoms, continue to monitor at home and seek medical attention if your symptoms worsen. . If you are having a medical emergency, call 911.   ADDITIONAL HEALTHCARE OPTIONS FOR PATIENTS  [facility] / e-Visit: [email or web address]         [facility]: [phone]  [facility]: [phone]                   [facility]: [phone]   

## 2018-09-23 NOTE — Progress Notes (Signed)
Radiation Oncology         [phone] ________________________________  Name: Sandra Shelton MRN: [id number]  Date: 09/23/2018  DOB: 27-Apr-1955  Follow-Up Visit Note  CC: [name], MD  Nicholas Lose, MD    ICD-10-CM   1. Malignant neoplasm of upper-outer quadrant of right breast in female, estrogen receptor positive ([facility])  C50.411    Z17.0     Diagnosis:   Stage IA(pT1c, pN0)rightBreast UOQ Invasive Ductal Carcinoma, ER+/ PR+/ Her2+, GradeII    Interval Since Last Radiation:  1 months  Narrative:  The patient returns today for routine follow-up.  She developed moist desquamation in the low axillary area and is been followed closely concerning this issue.  Patient continues on Herceptin which he is tolerating well.  She received another injection earlier today.  Patient will be starting Arimidex soon.  She denies any further pain along the breast or axillary region region.  She denies any swelling in her right arm or hand.                              ALLERGIES:  has No Known Allergies.  Meds: Current Outpatient Medications  Medication Sig Dispense Refill  . amLODipine (NORVASC) 10 MG tablet Take 1 tablet (10 mg total) by mouth daily. 90 tablet 2  . anastrozole (ARIMIDEX) 1 MG tablet Take 1 tablet (1 mg total) by mouth daily. 90 tablet 3  . aspirin 81 MG tablet Take 1 tablet (81 mg total) by mouth daily. 100 tablet 2  . blood glucose meter kit and supplies KIT Dispense based on patient and insurance preference. Use up to four times daily as directed. (FOR ICD-9 250.00, 250.01). 1 each 0  . clopidogrel (PLAVIX) 75 MG tablet Take 1 tablet (75 mg total) by mouth daily. 30 tablet 6  . Insulin Pen Needle (TRUEPLUS PEN NEEDLES) 32G X 4 MM MISC Use as directed to inject insulin 100 each 3  . LANTUS SOLOSTAR 100 UNIT/ML Solostar Pen Inject 7 Units into the skin 2 (two) times daily. 15 mL 6  . lidocaine-prilocaine (EMLA) cream APPLY 1 APPLICATION TOPICALLY AS NEEDED. 30 g 0   . lisinopril (ZESTRIL) 5 MG tablet Take 1 tablet (5 mg total) by mouth daily. 90 tablet 6  . metFORMIN (GLUCOPHAGE) 500 MG tablet Take 1 tablet (500 mg total) by mouth 2 (two) times daily with a meal. 180 tablet 3  . rosuvastatin (CRESTOR) 40 MG tablet TAKE 1 TABLET BY MOUTH DAILY AT 6 PM. 30 tablet 6   No current facility-administered medications for this encounter.     Physical Findings: The patient is in no acute distress. Patient is alert and oriented.  height is 5' 5"  (1.651 m) and weight is 190 lb 1.6 oz (86.2 kg). Her temporal temperature is 98.5 F (36.9 C). Her blood pressure is 149/56 (abnormal) and her pulse is 82. Her respiration is 18 and oxygen saturation is 100%. .  Lungs are clear to auscultation bilaterally. Heart has regular rate and rhythm. No palpable cervical, supraclavicular, or axillary adenopathy. Abdomen soft, non-tender, normal bowel sounds.  The right breast area has healed at this time.  She continues to have some edema in the breast and some hyperpigmentation changes in the location of her boost field and inframammary fold area.  Induration is noted in the surgical site.  Lab Findings: Lab Results  Component Value Date   WBC 4.5 09/23/2018   HGB  12.0 09/23/2018   HCT 36.6 09/23/2018   MCV 84.5 09/23/2018   PLT 280 09/23/2018    Radiographic Findings: No results found.  Impression:  The patient is recovering from the effects of radiation.  No evidence of recurrence on clinical exam.  The patient skin is healed well at this time she denies any residual pain in the breast area.  Plan: PRN follow-up in radiation oncology.  The patient will continue with close follow-up with medical oncology as above.  ____________________________________ Gery Pray, MD

## 2018-09-23 NOTE — Progress Notes (Signed)
Pt presents today for f/u with Dr. Sondra Come. Pt denies c/o pain. Pt denies fatigue. Pt has used all of provided cream and will start using a moisturizer of her choice on breast/treatment area. Breast is slightly hyperpigmented and swollen.  BP (!) 149/56 (BP Location: Left Arm, Patient Position: Sitting)   Pulse 82   Temp 98.5 F (36.9 C) (Temporal)   Resp 18   Ht 5\' 5"  (1.651 m)   Wt 190 lb 1.6 oz (86.2 kg)   SpO2 100%   BMI 31.63 kg/m   Wt Readings from Last 3 Encounters:  09/23/18 190 lb 1.6 oz (86.2 kg)  09/23/18 190 lb 1.6 oz (86.2 kg)  09/02/18 188 lb 12 oz (85.6 kg)   Loma Sousa, RN BSN

## 2018-09-24 MED FILL — ROSUVASTATIN CALCIUM 40 MG: 40 | 30 days supply | Qty: 30 | Fill #1

## 2018-09-24 MED FILL — CLOPIDOGREL 75 MG TABLET: 75 | 30 days supply | Qty: 30 | Fill #0

## 2018-09-25 ENCOUNTER — Ambulatory Visit: Payer: Medicare Other | Admitting: Radiation Oncology

## 2018-10-07 MED FILL — AMLODIPINE BESYLATE 10 MG T: 10 | 90 days supply | Qty: 90 | Fill #0

## 2018-10-07 MED FILL — metFORMIN HCL 500 MG TABS: 500 | 90 days supply | Qty: 180 | Fill #0

## 2018-10-14 ENCOUNTER — Inpatient Hospital Stay: Payer: Medicare Other

## 2018-10-14 ENCOUNTER — Other Ambulatory Visit: Payer: Self-pay | Admitting: Hematology and Oncology

## 2018-10-14 ENCOUNTER — Other Ambulatory Visit: Payer: Self-pay

## 2018-10-14 VITALS — BP 146/66 | HR 82 | Temp 98.5°F | Resp 18

## 2018-10-14 DIAGNOSIS — Z79811 Long term (current) use of aromatase inhibitors: Secondary | ICD-10-CM | POA: Diagnosis not present

## 2018-10-14 DIAGNOSIS — Z170 Estrogen receptor positive status [ER+]: Secondary | ICD-10-CM | POA: Diagnosis not present

## 2018-10-14 DIAGNOSIS — Z9221 Personal history of antineoplastic chemotherapy: Secondary | ICD-10-CM | POA: Diagnosis not present

## 2018-10-14 DIAGNOSIS — Z923 Personal history of irradiation: Secondary | ICD-10-CM | POA: Diagnosis not present

## 2018-10-14 DIAGNOSIS — Z5112 Encounter for antineoplastic immunotherapy: Secondary | ICD-10-CM | POA: Diagnosis not present

## 2018-10-14 DIAGNOSIS — C50411 Malignant neoplasm of upper-outer quadrant of right female breast: Secondary | ICD-10-CM | POA: Diagnosis not present

## 2018-10-14 MED ORDER — DIPHENHYDRAMINE HCL 25 MG PO CAPS
ORAL_CAPSULE | ORAL | Status: AC
  Filled 2018-10-14: qty 2

## 2018-10-14 MED ORDER — DIPHENHYDRAMINE HCL 25 MG PO CAPS
50.0000 mg | ORAL_CAPSULE | Freq: Once | ORAL | Status: AC
  Administered 2018-10-14: 09:00:00 50 mg via ORAL

## 2018-10-14 MED ORDER — SODIUM CHLORIDE 0.9 % IV SOLN
Freq: Once | INTRAVENOUS | Status: AC
  Administered 2018-10-14: 09:00:00 via INTRAVENOUS
  Filled 2018-10-14: qty 250

## 2018-10-14 MED ORDER — SODIUM CHLORIDE 0.9% FLUSH
10.0000 mL | INTRAVENOUS | Status: DC | PRN
  Administered 2018-10-14: 11:00:00 10 mL
  Filled 2018-10-14: qty 10

## 2018-10-14 MED ORDER — ACETAMINOPHEN 325 MG PO TABS
ORAL_TABLET | ORAL | Status: AC
  Filled 2018-10-14: qty 2

## 2018-10-14 MED ORDER — ACETAMINOPHEN 325 MG PO TABS
650.0000 mg | ORAL_TABLET | Freq: Once | ORAL | Status: AC
  Administered 2018-10-14: 09:00:00 650 mg via ORAL

## 2018-10-14 MED ORDER — TRASTUZUMAB-DKST CHEMO 150 MG IV SOLR
6.0000 mg/kg | Freq: Once | INTRAVENOUS | Status: AC
  Administered 2018-10-14: 10:00:00 525 mg via INTRAVENOUS
  Filled 2018-10-14: qty 25

## 2018-10-14 MED ORDER — HEPARIN SOD (PORK) LOCK FLUSH 100 UNIT/ML IV SOLN
500.0000 [IU] | Freq: Once | INTRAVENOUS | Status: AC | PRN
  Administered 2018-10-14: 11:00:00 500 [IU]
  Filled 2018-10-14: qty 5

## 2018-10-14 NOTE — Patient Instructions (Signed)
[facility] Discharge Instructions for Patients Receiving Chemotherapy  Today you received the following chemotherapy agents Trastuzumab-dkst  To help prevent nausea and vomiting after your treatment, we encourage you to take your nausea medication as directed by your MD.   If you develop nausea and vomiting that is not controlled by your nausea medication, call the clinic.   BELOW ARE SYMPTOMS THAT SHOULD BE REPORTED IMMEDIATELY:  *FEVER GREATER THAN 100.5 F  *CHILLS WITH OR WITHOUT FEVER  NAUSEA AND VOMITING THAT IS NOT CONTROLLED WITH YOUR NAUSEA MEDICATION  *UNUSUAL SHORTNESS OF BREATH  *UNUSUAL BRUISING OR BLEEDING  TENDERNESS IN MOUTH AND THROAT WITH OR WITHOUT PRESENCE OF ULCERS  *URINARY PROBLEMS  *BOWEL PROBLEMS  UNUSUAL RASH Items with * indicate a potential emergency and should be followed up as soon as possible.  Feel free to call the clinic should you have any questions or concerns. The clinic phone number is [phone].  Please show the [facility] at check-in to the Emergency Department and triage nurse.  Coronavirus (COVID-19) Are you at risk?  Are you at risk for the Coronavirus (COVID-19)?  To be considered HIGH RISK for Coronavirus (COVID-19), you have to meet the following criteria:  . Traveled to [place], [place], [place], [place] or [place]; or in the [place] to [place], [place], [place], or [place]; and have fever, cough, and shortness of breath within the last 2 weeks of travel OR . Been in close contact with a person diagnosed with COVID-19 within the last 2 weeks and have fever, cough, and shortness of breath . IF YOU DO NOT MEET THESE CRITERIA, YOU ARE CONSIDERED LOW RISK FOR COVID-19.  What to do if you are HIGH RISK for COVID-19?  Marland Kitchen If you are having a medical emergency, call 911. . Seek medical care right away. Before you go to a doctor's office, urgent care or emergency department, call ahead and tell  them about your recent travel, contact with someone diagnosed with COVID-19, and your symptoms. You should receive instructions from your physician's office regarding next steps of care.  . When you arrive at healthcare provider, tell the healthcare staff immediately you have returned from visiting [place], [place], [place], [place] or [place]; or traveled in the [place] to [place], [place], [place], or [place]; in the last two weeks or you have been in close contact with a person diagnosed with COVID-19 in the last 2 weeks.   . Tell the health care staff about your symptoms: fever, cough and shortness of breath. . After you have been seen by a medical provider, you will be either: o Tested for (COVID-19) and discharged home on quarantine except to seek medical care if symptoms worsen, and asked to  - Stay home and avoid contact with others until you get your results (4-5 days)  - Avoid travel on public transportation if possible (such as bus, train, or airplane) or o Sent to the Emergency Department by EMS for evaluation, COVID-19 testing, and possible admission depending on your condition and test results.  What to do if you are LOW RISK for COVID-19?  Reduce your risk of any infection by using the same precautions used for avoiding the common cold or flu:  Marland Kitchen Wash your hands often with soap and warm water for at least 20 seconds.  If soap and water are not readily available, use an alcohol-based hand sanitizer with at least 60% alcohol.  . If  coughing or sneezing, cover your mouth and nose by coughing or sneezing into the elbow areas of your shirt or coat, into a tissue or into your sleeve (not your hands). . Avoid shaking hands with others and consider head nods or verbal greetings only. . Avoid touching your eyes, nose, or mouth with unwashed hands.  . Avoid close contact with people who are sick. . Avoid places or events with large numbers of people in one location, like concerts or  sporting events. . Carefully consider travel plans you have or are making. . If you are planning any travel outside or inside the [place], visit the CDC's Travelers' Health webpage for the latest health notices. . If you have some symptoms but not all symptoms, continue to monitor at home and seek medical attention if your symptoms worsen. . If you are having a medical emergency, call 911.   ADDITIONAL HEALTHCARE OPTIONS FOR PATIENTS  [facility] / e-Visit: [email or web address]         [facility]: [phone]  [facility]: [phone]                   [facility]: [phone]   

## 2018-10-20 MED FILL — LISINOPRIL 5 MG TABLET: 5 | 90 days supply | Qty: 90 | Fill #7

## 2018-10-20 MED FILL — CLOPIDOGREL 75 MG TABLET: 75 | 30 days supply | Qty: 30 | Fill #1

## 2018-10-20 MED FILL — ANASTROZOLE 1 MG TABLET: 1 | 30 days supply | Qty: 30 | Fill #1

## 2018-10-20 MED FILL — ROSUVASTATIN CALCIUM 40 MG: 40 | 30 days supply | Qty: 30 | Fill #2

## 2018-10-30 ENCOUNTER — Telehealth: Payer: Self-pay | Admitting: Hematology and Oncology

## 2018-10-30 NOTE — Telephone Encounter (Signed)
[facility] PAL 10/20. Follow up moved from [facility] to [facility]. Treatment patient. Start time remains the same. Confirmed with patient.

## 2018-11-04 ENCOUNTER — Inpatient Hospital Stay: Payer: Medicare Other

## 2018-11-04 ENCOUNTER — Inpatient Hospital Stay: Payer: Medicare Other | Attending: Hematology and Oncology

## 2018-11-04 ENCOUNTER — Other Ambulatory Visit: Payer: Self-pay

## 2018-11-04 ENCOUNTER — Encounter: Payer: Self-pay | Admitting: Adult Health

## 2018-11-04 ENCOUNTER — Inpatient Hospital Stay (HOSPITAL_BASED_OUTPATIENT_CLINIC_OR_DEPARTMENT_OTHER): Payer: Medicare Other | Admitting: Adult Health

## 2018-11-04 VITALS — BP 135/55 | HR 80 | Temp 98.0°F | Resp 18 | Ht 65.0 in | Wt 192.5 lb

## 2018-11-04 DIAGNOSIS — I10 Essential (primary) hypertension: Secondary | ICD-10-CM | POA: Insufficient documentation

## 2018-11-04 DIAGNOSIS — E2839 Other primary ovarian failure: Secondary | ICD-10-CM

## 2018-11-04 DIAGNOSIS — E1151 Type 2 diabetes mellitus with diabetic peripheral angiopathy without gangrene: Secondary | ICD-10-CM | POA: Diagnosis not present

## 2018-11-04 DIAGNOSIS — Z170 Estrogen receptor positive status [ER+]: Secondary | ICD-10-CM | POA: Insufficient documentation

## 2018-11-04 DIAGNOSIS — K219 Gastro-esophageal reflux disease without esophagitis: Secondary | ICD-10-CM | POA: Insufficient documentation

## 2018-11-04 DIAGNOSIS — Z79811 Long term (current) use of aromatase inhibitors: Secondary | ICD-10-CM | POA: Insufficient documentation

## 2018-11-04 DIAGNOSIS — R5383 Other fatigue: Secondary | ICD-10-CM | POA: Insufficient documentation

## 2018-11-04 DIAGNOSIS — Z79899 Other long term (current) drug therapy: Secondary | ICD-10-CM | POA: Diagnosis not present

## 2018-11-04 DIAGNOSIS — Z5112 Encounter for antineoplastic immunotherapy: Secondary | ICD-10-CM | POA: Diagnosis not present

## 2018-11-04 DIAGNOSIS — Z8673 Personal history of transient ischemic attack (TIA), and cerebral infarction without residual deficits: Secondary | ICD-10-CM | POA: Insufficient documentation

## 2018-11-04 DIAGNOSIS — C50411 Malignant neoplasm of upper-outer quadrant of right female breast: Secondary | ICD-10-CM

## 2018-11-04 DIAGNOSIS — Z9221 Personal history of antineoplastic chemotherapy: Secondary | ICD-10-CM | POA: Diagnosis not present

## 2018-11-04 DIAGNOSIS — E119 Type 2 diabetes mellitus without complications: Secondary | ICD-10-CM | POA: Insufficient documentation

## 2018-11-04 DIAGNOSIS — I6330 Cerebral infarction due to thrombosis of unspecified cerebral artery: Secondary | ICD-10-CM | POA: Diagnosis not present

## 2018-11-04 DIAGNOSIS — C50111 Malignant neoplasm of central portion of right female breast: Secondary | ICD-10-CM

## 2018-11-04 DIAGNOSIS — Z923 Personal history of irradiation: Secondary | ICD-10-CM | POA: Diagnosis not present

## 2018-11-04 DIAGNOSIS — Z95828 Presence of other vascular implants and grafts: Secondary | ICD-10-CM

## 2018-11-04 LAB — CBC WITH DIFFERENTIAL (CANCER CENTER ONLY)
Abs Immature Granulocytes: 0.01 10*3/uL (ref 0.00–0.07)
Basophils Absolute: 0 10*3/uL (ref 0.0–0.1)
Basophils Relative: 1 %
Eosinophils Absolute: 0.1 10*3/uL (ref 0.0–0.5)
Eosinophils Relative: 3 %
HCT: 36.8 % (ref 36.0–46.0)
Hemoglobin: 12.1 g/dL (ref 12.0–15.0)
Immature Granulocytes: 0 %
Lymphocytes Relative: 29 %
Lymphs Abs: 1.5 10*3/uL (ref 0.7–4.0)
MCH: 27.3 pg (ref 26.0–34.0)
MCHC: 32.9 g/dL (ref 30.0–36.0)
MCV: 82.9 fL (ref 80.0–100.0)
Monocytes Absolute: 0.5 10*3/uL (ref 0.1–1.0)
Monocytes Relative: 9 %
Neutro Abs: 2.9 10*3/uL (ref 1.7–7.7)
Neutrophils Relative %: 58 %
Platelet Count: 263 10*3/uL (ref 150–400)
RBC: 4.44 MIL/uL (ref 3.87–5.11)
RDW: 12.9 % (ref 11.5–15.5)
WBC Count: 5 10*3/uL (ref 4.0–10.5)
nRBC: 0 % (ref 0.0–0.2)

## 2018-11-04 LAB — CMP (CANCER CENTER ONLY)
ALT: 22 U/L (ref 0–44)
AST: 16 U/L (ref 15–41)
Albumin: 3.8 g/dL (ref 3.5–5.0)
Alkaline Phosphatase: 59 U/L (ref 38–126)
Anion gap: 11 (ref 5–15)
BUN: 17 mg/dL (ref 8–23)
CO2: 20 mmol/L — ABNORMAL LOW (ref 22–32)
Calcium: 10.2 mg/dL (ref 8.9–10.3)
Chloride: 109 mmol/L (ref 98–111)
Creatinine: 0.9 mg/dL (ref 0.44–1.00)
GFR, Est AFR Am: >60 mL/min (ref >60)
GFR, Estimated: >60 mL/min (ref >60)
Glucose, Bld: 234 mg/dL — ABNORMAL HIGH (ref 70–99)
Potassium: 4 mmol/L (ref 3.5–5.1)
Sodium: 140 mmol/L (ref 135–145)
Total Bilirubin: <0.2 mg/dL — ABNORMAL LOW (ref 0.3–1.2)
Total Protein: 7.3 g/dL (ref 6.5–8.1)

## 2018-11-04 MED ORDER — DIPHENHYDRAMINE HCL 25 MG PO CAPS
50.0000 mg | ORAL_CAPSULE | Freq: Once | ORAL | Status: AC
  Administered 2018-11-04: 50 mg via ORAL

## 2018-11-04 MED ORDER — ACETAMINOPHEN 325 MG PO TABS
ORAL_TABLET | ORAL | Status: AC
  Filled 2018-11-04: qty 2

## 2018-11-04 MED ORDER — DIPHENHYDRAMINE HCL 25 MG PO CAPS
ORAL_CAPSULE | ORAL | Status: AC
  Filled 2018-11-04: qty 2

## 2018-11-04 MED ORDER — HEPARIN SOD (PORK) LOCK FLUSH 100 UNIT/ML IV SOLN
500.0000 [IU] | Freq: Once | INTRAVENOUS | Status: AC | PRN
  Administered 2018-11-04: 500 [IU]
  Filled 2018-11-04: qty 5

## 2018-11-04 MED ORDER — SODIUM CHLORIDE 0.9 % IV SOLN
Freq: Once | INTRAVENOUS | Status: AC
  Administered 2018-11-04: 10:00:00 via INTRAVENOUS
  Filled 2018-11-04: qty 250

## 2018-11-04 MED ORDER — SODIUM CHLORIDE 0.9% FLUSH
10.0000 mL | Freq: Once | INTRAVENOUS | Status: AC
  Administered 2018-11-04: 09:00:00 10 mL
  Filled 2018-11-04: qty 10

## 2018-11-04 MED ORDER — ACETAMINOPHEN 325 MG PO TABS
650.0000 mg | ORAL_TABLET | Freq: Once | ORAL | Status: AC
  Administered 2018-11-04: 650 mg via ORAL

## 2018-11-04 MED ORDER — TRASTUZUMAB-DKST CHEMO 150 MG IV SOLR
6.0000 mg/kg | Freq: Once | INTRAVENOUS | Status: AC
  Administered 2018-11-04: 525 mg via INTRAVENOUS
  Filled 2018-11-04: qty 25

## 2018-11-04 MED ORDER — SODIUM CHLORIDE 0.9% FLUSH
10.0000 mL | INTRAVENOUS | Status: DC | PRN
  Administered 2018-11-04: 10 mL
  Filled 2018-11-04: qty 10

## 2018-11-04 MED FILL — LANTUS SOLOSTAR 100 UNITS/M: 100 | 42 days supply | Qty: 6 | Fill #1

## 2018-11-04 NOTE — Patient Instructions (Signed)
[facility] Discharge Instructions for Patients Receiving Chemotherapy  Today you received the following chemotherapy agents Trastuzumab-dkst  To help prevent nausea and vomiting after your treatment, we encourage you to take your nausea medication as directed by your MD.   If you develop nausea and vomiting that is not controlled by your nausea medication, call the clinic.   BELOW ARE SYMPTOMS THAT SHOULD BE REPORTED IMMEDIATELY:  *FEVER GREATER THAN 100.5 F  *CHILLS WITH OR WITHOUT FEVER  NAUSEA AND VOMITING THAT IS NOT CONTROLLED WITH YOUR NAUSEA MEDICATION  *UNUSUAL SHORTNESS OF BREATH  *UNUSUAL BRUISING OR BLEEDING  TENDERNESS IN MOUTH AND THROAT WITH OR WITHOUT PRESENCE OF ULCERS  *URINARY PROBLEMS  *BOWEL PROBLEMS  UNUSUAL RASH Items with * indicate a potential emergency and should be followed up as soon as possible.  Feel free to call the clinic should you have any questions or concerns. The clinic phone number is [phone].  Please show the [facility] at check-in to the Emergency Department and triage nurse.  Coronavirus (COVID-19) Are you at risk?  Are you at risk for the Coronavirus (COVID-19)?  To be considered HIGH RISK for Coronavirus (COVID-19), you have to meet the following criteria:  . Traveled to [place], [place], [place], [place] or [place]; or in the [place] to [place], [place], [place], or [place]; and have fever, cough, and shortness of breath within the last 2 weeks of travel OR . Been in close contact with a person diagnosed with COVID-19 within the last 2 weeks and have fever, cough, and shortness of breath . IF YOU DO NOT MEET THESE CRITERIA, YOU ARE CONSIDERED LOW RISK FOR COVID-19.  What to do if you are HIGH RISK for COVID-19?  Marland Kitchen If you are having a medical emergency, call 911. . Seek medical care right away. Before you go to a doctor's office, urgent care or emergency department, call ahead and tell  them about your recent travel, contact with someone diagnosed with COVID-19, and your symptoms. You should receive instructions from your physician's office regarding next steps of care.  . When you arrive at healthcare provider, tell the healthcare staff immediately you have returned from visiting [place], [place], [place], [place] or [place]; or traveled in the [place] to [place], [place], [place], or [place]; in the last two weeks or you have been in close contact with a person diagnosed with COVID-19 in the last 2 weeks.   . Tell the health care staff about your symptoms: fever, cough and shortness of breath. . After you have been seen by a medical provider, you will be either: o Tested for (COVID-19) and discharged home on quarantine except to seek medical care if symptoms worsen, and asked to  - Stay home and avoid contact with others until you get your results (4-5 days)  - Avoid travel on public transportation if possible (such as bus, train, or airplane) or o Sent to the Emergency Department by EMS for evaluation, COVID-19 testing, and possible admission depending on your condition and test results.  What to do if you are LOW RISK for COVID-19?  Reduce your risk of any infection by using the same precautions used for avoiding the common cold or flu:  Marland Kitchen Wash your hands often with soap and warm water for at least 20 seconds.  If soap and water are not readily available, use an alcohol-based hand sanitizer with at least 60% alcohol.  . If  coughing or sneezing, cover your mouth and nose by coughing or sneezing into the elbow areas of your shirt or coat, into a tissue or into your sleeve (not your hands). . Avoid shaking hands with others and consider head nods or verbal greetings only. . Avoid touching your eyes, nose, or mouth with unwashed hands.  . Avoid close contact with people who are sick. . Avoid places or events with large numbers of people in one location, like concerts or  sporting events. . Carefully consider travel plans you have or are making. . If you are planning any travel outside or inside the [place], visit the CDC's Travelers' Health webpage for the latest health notices. . If you have some symptoms but not all symptoms, continue to monitor at home and seek medical attention if your symptoms worsen. . If you are having a medical emergency, call 911.   ADDITIONAL HEALTHCARE OPTIONS FOR PATIENTS  [facility] / e-Visit: [email or web address]         [facility]: [phone]  [facility]: [phone]                   [facility]: [phone]   

## 2018-11-04 NOTE — Progress Notes (Signed)
OK to treat with ECHO from July 2020.

## 2018-11-04 NOTE — Progress Notes (Signed)
[facility] Follow up:    Sandra Pier, MD [place] [address]   DIAGNOSIS: Cancer Staging Malignant neoplasm of upper-outer quadrant of right breast in female, estrogen receptor positive ([place]) Staging form: Breast, AJCC 8th Edition - Clinical stage from 02/12/2018: Stage IA (cT1b, cN0, cM0, G2, ER+, PR+, HER2+) - Unsigned - Pathologic stage from 03/24/2018: Stage IA (pT1c, pN0, cM0, G2, ER+, PR+, HER2+) - Signed by Gardenia Phlegm, NP on 03/26/2018   SUMMARY OF ONCOLOGIC HISTORY: Oncology History  Malignant neoplasm of upper-outer quadrant of right breast in female, estrogen receptor positive ([place])  01/27/2018 Mammogram   Screening Mammogram  Possible right breast mass; asymmetry in left breast.   02/10/2018 Initial Diagnosis   Screening mammogram detected right breast mass and asymmetry in the left breast.  The left breast asymmetry resolved.  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%, HER-2 +3+ by IHC, T1BN0 stage Ia clinical stage   03/24/2018 Surgery   Lumpectomy and SLNB: IDC, 1.2cm, grade 2, margins negative, 4 SLN negative, T1cN0, previously ER/PR/HER-2 positive with Ki-67 of 20%   03/24/2018 Cancer Staging   Staging form: Breast, AJCC 8th Edition - Pathologic stage from 03/24/2018: Stage IA (pT1c, pN0, cM0, G2, ER+, PR+, HER2+) - Signed by Gardenia Phlegm, NP on 03/26/2018   04/22/2018 - 06/24/2018 Chemotherapy   The patient had trastuzumab (HERCEPTIN) 336 mg in sodium chloride 0.9 % 250 mL chemo infusion, 4 mg/kg = 336 mg, Intravenous,  Once, 3 of 16 cycles Administration: 336 mg (04/22/2018), 168 mg (04/29/2018), 168 mg (05/21/2018), 168 mg (05/06/2018), 168 mg (05/13/2018), 168 mg (05/27/2018), 168 mg (06/03/2018), 168 mg (06/10/2018), 168 mg (06/17/2018), 168 mg (06/24/2018) PACLitaxel (TAXOL) 156 mg in sodium chloride  0.9 % 250 mL chemo infusion (</= 36m/m2), 80 mg/m2 = 156 mg, Intravenous,  Once, 3 of 3 cycles Administration: 156 mg (04/22/2018), 156 mg (04/29/2018), 156 mg (05/21/2018), 156 mg (05/06/2018), 156 mg (05/13/2018), 156 mg (05/27/2018), 156 mg (06/03/2018), 156 mg (06/10/2018), 156 mg (06/17/2018), 156 mg (06/24/2018)  for chemotherapy treatment.    07/01/2018 -  Chemotherapy   The patient had trastuzumab (HERCEPTIN) 525 mg in sodium chloride 0.9 % 250 mL chemo infusion, 6 mg/kg = 525 mg (100 % of original dose 6 mg/kg), Intravenous,  Once, 3 of 3 cycles Dose modification: 6 mg/kg (original dose 6 mg/kg, Cycle 1, Reason: Provider Judgment) Administration: 525 mg (07/01/2018), 525 mg (07/22/2018), 525 mg (08/12/2018) trastuzumab-dkst (OGIVRI) 525 mg in sodium chloride 0.9 % 250 mL chemo infusion, 6 mg/kg = 525 mg (100 % of original dose 6 mg/kg), Intravenous,  Once, 4 of 10 cycles Dose modification: 6 mg/kg (original dose 6 mg/kg, Cycle 4, Reason: Other (see comments), Comment: Biosimilar Conversion) Administration: 525 mg (09/02/2018), 525 mg (09/23/2018), 525 mg (10/14/2018)  for chemotherapy treatment.    07/29/2018 - 08/22/2018 Radiation Therapy   Adjuvant XRT   09/05/2018 -  Anti-estrogen oral therapy   Anastrozole 17mdaily, plan for 5-7 years      CURRENT THERAPY: Anastrozole daily, Herceptin  INTERVAL HISTORY: Sandra Spizzirri89.o. female returns for evaluation of her triple positive breast cancer.  She was started on Anastrozole on 09/05/2018.  She denies hot flashes, vaginal dryness or joint aches and pains.    She also receives Herceptin every three weeks.  She denies any difficulty tolerating  this.    Her last echo was on 07/31/2018 and her EF was 60-65%.  She is not experiencing any leg swelling, cough, DOE, orthopnea, or any other concerns today.    She walks daily around the block.  She says this takes about 30 minutes.     Patient Active Problem List   Diagnosis Date Noted  . Port-A-Cath in  place 05/06/2018  . Malignant neoplasm of upper-outer quadrant of right breast in female, estrogen receptor positive ([place]) 02/10/2018  . Malignant neoplasm of central portion of right female breast ([place]) 02/06/2018  . Hypercalcemia 12/09/2017  . Diabetes mellitus type II, controlled ([place]) 09/02/2017  . Essential hypertension 09/02/2017  . Left-sided carotid artery disease ([place]) 09/02/2017  . Pre-ulcerative corn or callous 09/02/2017  . Embolic stroke involving left carotid artery ([place]) 07/24/2017  . Cerebral thrombosis with cerebral infarction 07/08/2017    has No Known Allergies.  MEDICAL HISTORY: Past Medical History:  Diagnosis Date  . Breast cancer ([place])    right IDC  . Diabetes mellitus without complication ([place])    type 2  . Embolic stroke involving carotid artery ([place])   . GERD (gastroesophageal reflux disease)   . Heart murmur    no problems with it  . Hepatitis    in the 80's, took meds at the time   . Hypertension   . Peripheral vascular disease ([facility])    carotid artery blockage   . Stroke ([place]) 07/12/2017  . Vision abnormalities     SURGICAL HISTORY: Past Surgical History:  Procedure Laterality Date  . BREAST EXCISIONAL BIOPSY Right   . BREAST LUMPECTOMY Right 03/2018  . BREAST LUMPECTOMY WITH RADIOACTIVE SEED AND SENTINEL LYMPH NODE BIOPSY Right 03/24/2018   Procedure: RIGHT BREAST LUMPECTOMY WITH RADIOACTIVE SEED AND RIGHT AXILLARY DEEP SENTINEL LYMPH NODE BIOPSY INJECT BLUE DYE RIGHT BREAST;  Surgeon: Fanny Skates, MD;  Location: [facility];  Service: General;  Laterality: Right;  . COLONOSCOPY    . PORTACATH PLACEMENT N/A 04/09/2018   Procedure: INSERTION PORT-A-CATH WITH ULTRASOUND;  Surgeon: Fanny Skates, MD;  Location: [facility];  Service: General;  Laterality: N/A;  . TRANSCAROTID ARTERY REVASCULARIZATION Left 07/24/2017   Procedure: TRANSCAROTID ARTERY REVASCULARIZATION, left;  Surgeon: Elam Dutch, MD;   Location: [facility];  Service: Vascular;  Laterality: Left;  . UTERINE FIBROID SURGERY     2005    SOCIAL HISTORY: Social History   Socioeconomic History  . Marital status: Single    Spouse name: Not on file  . Number of children: Not on file  . Years of education: Not on file  . Highest education level: Not on file  Occupational History  . Not on file  Social Needs  . Financial resource strain: Not on file  . Food insecurity    Worry: Not on file    Inability: Not on file  . Transportation needs    Medical: Not on file    Non-medical: Not on file  Tobacco Use  . Smoking status: Never Smoker  . Smokeless tobacco: Never Used  Substance and Sexual Activity  . Alcohol use: No  . Drug use: No  . Sexual activity: Not on file  Lifestyle  . Physical activity    Days per week: Not on file    Minutes per session: Not on file  . Stress: Not on file  Relationships  . Social Herbalist on phone: Not on file    Gets together:  Not on file    Attends religious service: Not on file    Active member of club or organization: Not on file    Attends meetings of clubs or organizations: Not on file    Relationship status: Not on file  . Intimate partner violence    Fear of current or ex partner: Not on file    Emotionally abused: Not on file    Physically abused: Not on file    Forced sexual activity: Not on file  Other Topics Concern  . Not on file  Social History Narrative   Lives home alone.  Single.  Education Some college.  No children.      FAMILY HISTORY: Family History  Problem Relation Age of Onset  . Diabetes Mellitus II Sister   . Stroke Mother   . Diabetes Mellitus II Mother   . Cancer Father   . Diabetes Mellitus II Brother   . Breast cancer Maternal Aunt     Review of Systems  Constitutional: Positive for fatigue (on occasion, doesn't interfere with activies). Negative for appetite change, chills and unexpected weight change.  HENT:   Negative for  hearing loss, lump/mass and trouble swallowing.   Eyes: Negative for eye problems and icterus.  Respiratory: Negative for chest tightness, cough and shortness of breath.   Cardiovascular: Negative for chest pain, leg swelling and palpitations.  Gastrointestinal: Negative for abdominal distention, abdominal pain, constipation, diarrhea, nausea and vomiting.  Endocrine: Negative for hot flashes.  Genitourinary: Negative for difficulty urinating.   Musculoskeletal: Negative for arthralgias.  Skin: Negative for itching and rash.  Neurological: Negative for dizziness, extremity weakness, light-headedness and numbness.  Hematological: Negative for adenopathy. Does not bruise/bleed easily.  Psychiatric/Behavioral: Negative for depression. The patient is not nervous/anxious.      PHYSICAL EXAMINATION  ECOG PERFORMANCE STATUS: 1 - Symptomatic but completely ambulatory  Vitals:   11/04/18 0844  BP: (!) 135/55  Pulse: 80  Resp: 18  Temp: 98 F (36.7 C)  SpO2: 100%    Physical Exam Vitals signs reviewed.  Constitutional:      Appearance: Normal appearance.  HENT:     Head: Normocephalic and atraumatic.     Mouth/Throat:     Mouth: Mucous membranes are moist.     Pharynx: Oropharynx is clear. No oropharyngeal exudate or posterior oropharyngeal erythema.  Eyes:     General: No scleral icterus.    Pupils: Pupils are equal, round, and reactive to light.  Neck:     Musculoskeletal: Neck supple.  Cardiovascular:     Rate and Rhythm: Normal rate and regular rhythm.     Pulses: Normal pulses.     Heart sounds: Normal heart sounds.  Pulmonary:     Effort: Pulmonary effort is normal.     Breath sounds: Normal breath sounds.  Abdominal:     General: Abdomen is flat. There is no distension.     Palpations: Abdomen is soft.     Tenderness: There is no abdominal tenderness.  Skin:    General: Skin is warm and dry.     Capillary Refill: Capillary refill takes less than 2 seconds.   Neurological:     General: No focal deficit present.     Mental Status: She is alert and oriented to person, place, and time.  Psychiatric:        Mood and Affect: Mood normal.        Behavior: Behavior normal.     LABORATORY DATA:  CBC  Component Value Date/Time   WBC 5.0 11/04/2018 0834   WBC 7.3 03/20/2018 1207   RBC 4.44 11/04/2018 0834   HGB 12.1 11/04/2018 0834   HGB 13.0 09/02/2017 1043   HCT 36.8 11/04/2018 0834   HCT 39.2 09/02/2017 1043   PLT 263 11/04/2018 0834   PLT 394 09/02/2017 1043   MCV 82.9 11/04/2018 0834   MCV 84 09/02/2017 1043   MCH 27.3 11/04/2018 0834   MCHC 32.9 11/04/2018 0834   RDW 12.9 11/04/2018 0834   RDW 13.4 09/02/2017 1043   LYMPHSABS 1.5 11/04/2018 0834   MONOABS 0.5 11/04/2018 0834   EOSABS 0.1 11/04/2018 0834   BASOSABS 0.0 11/04/2018 0834    CMP     Component Value Date/Time   NA 140 11/04/2018 0834   NA 140 09/02/2017 1043   K 4.0 11/04/2018 0834   CL 109 11/04/2018 0834   CO2 20 (L) 11/04/2018 0834   GLUCOSE 234 (H) 11/04/2018 0834   BUN 17 11/04/2018 0834   BUN 12 09/02/2017 1043   CREATININE 0.90 11/04/2018 0834   CALCIUM 10.2 11/04/2018 0834   PROT 7.3 11/04/2018 0834   PROT 7.9 09/02/2017 1043   ALBUMIN 3.8 11/04/2018 0834   ALBUMIN 5.0 (H) 09/02/2017 1043   AST 16 11/04/2018 0834   ALT 22 11/04/2018 0834   ALKPHOS 59 11/04/2018 0834   BILITOT <0.2 (L) 11/04/2018 0834   GFRNONAA >60 11/04/2018 0834   GFRAA >60 11/04/2018 0834          ASSESSMENT and THERAPY PLAN:   Malignant neoplasm of upper-outer quadrant of right breast in female, estrogen receptor positive ([place]) 02/04/2018:Screening mammogram detected right breast mass and asymmetry in the left breast. The left breast asymmetry resolved. 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%, HER-2 +3+ by IHC, T1BN0 stage Ia clinical  stage  03/24/2018: Rt BCS: Grade 2 IDC 1.2 cm ER 100%, PR 70%, Ki-67 20%, HER-2 +3+ by IHC, T1BN0 stage Ia Left BCS: DCIS  Treatment plan: 1. Adj chemo with Taxol Herceptin weekly X 11 (stopped for peripheral neuropathy) started 04/22/2018-07/01/2018, now on [name] to complete 1 full year of therapy 2. Adjuvant radiation therapy completed 08/22/2018 3. Adjuvant antiestrogen therapy started 09/05/2018 ------------------------------------------------------------------------------------------------------------------------ Current treatment: [name] therapy every 3 weeks, anastrozole 1 mg daily started 09/05/2018 Echo: 07/31/2018 shows an EF of 60-65%  Chemo-induced peripheral neuropathy: Stable  Anastrozole toxicities: none, counseled on bone health  Breast swelling: I recommended she wear a tighter fitting bra, and massage her breast with vitamin E oil to help.  If swelling persists, referral to PT can certainly be placed.  Patient is not experiencing any side effects to Herceptin. Echo ordered to be completed prior to her next treatment.    I placed orders for her mammogram and bone density testing to be completed in 02/2019.  I reviewed this in detail with the patient.    Return to clinic every 3 weeks for Herceptin every 6 weeks of follow-up with Dr. Estill Bakes APP.   Orders Placed This Encounter  Procedures  . MM DIAG BREAST TOMO BILATERAL    Standing Status:   Future    Standing Expiration Date:   01/04/2020    Order Specific Question:   Reason for Exam (SYMPTOM  OR DIAGNOSIS REQUIRED)    Answer:   breast cancer    Order Specific Question:   Preferred  imaging location?    Answer:   [facility]  . DG Bone Density    Standing Status:   Future    Standing Expiration Date:   01/04/2020    Scheduling Instructions:     Schedule with mammogram    Order Specific Question:   Reason for Exam (SYMPTOM  OR DIAGNOSIS REQUIRED)    Answer:   estrogen  deficiency    Order Specific Question:   Preferred imaging location?    Answer:   [facility]  . ECHOCARDIOGRAM COMPLETE    Standing Status:   Future    Standing Expiration Date:   02/04/2020    Order Specific Question:   Where should this test be performed    Answer:   [facility]    Order Specific Question:   Perflutren DEFINITY (image enhancing agent) should be administered unless hypersensitivity or allergy exist    Answer:   Administer Perflutren    Order Specific Question:   Reason for exam-Echo    Answer:   Chemotherapy evaluation  v87.41 / v58.11    All questions were answered. The patient knows to call the clinic with any problems, questions or concerns. We can certainly see the patient much sooner if necessary.  A total of (30) minutes of face-to-face time was spent with this patient with greater than 50% of that time in counseling and care-coordination.  This note was electronically signed. Scot Dock, NP 11/04/2018

## 2018-11-04 NOTE — Assessment & Plan Note (Addendum)
02/04/2018:Screening mammogram detected right breast mass and asymmetry in the left breast. The left breast asymmetry resolved. 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 Adj chemo with Taxol Herceptin weekly X 11 (stopped for peripheral neuropathy) started 04/22/2018-07/01/2018, now on [name] to complete 1 full year of therapy 2. Adjuvant radiation therapy completed 08/22/2018 3. Adjuvant antiestrogen therapy started 09/05/2018 ------------------------------------------------------------------------------------------------------------------------ Current treatment: [name] therapy every 3 weeks, anastrozole 1 mg daily started 09/05/2018 Echo: 07/31/2018 shows an EF of 60-65%  Chemo-induced peripheral neuropathy: Stable  Anastrozole toxicities: none, counseled on bone health  Breast swelling: I recommended she wear a tighter fitting bra, and massage her breast with vitamin E oil to help.  If swelling persists, referral to PT can certainly be placed.  Patient is not experiencing any side effects to Herceptin. Echo ordered to be completed prior to her next treatment.    I placed orders for her mammogram and bone density testing to be completed in 02/2019.  I reviewed this in detail with the patient.    Return to clinic every 3 weeks for Herceptin every 6 weeks of follow-up with Dr. Gudena/Oncology APP. 

## 2018-11-05 ENCOUNTER — Telehealth: Payer: Self-pay | Admitting: Adult Health

## 2018-11-05 NOTE — Telephone Encounter (Signed)
No los for 10/20

## 2018-11-11 ENCOUNTER — Encounter (INDEPENDENT_AMBULATORY_CARE_PROVIDER_SITE_OTHER): Payer: Self-pay

## 2018-11-11 ENCOUNTER — Ambulatory Visit (HOSPITAL_COMMUNITY)
Admission: RE | Admit: 2018-11-11 | Discharge: 2018-11-11 | Disposition: A | Discharge status: Home / Self Care | Payer: Medicare Other | Source: Ambulatory Visit | Attending: Adult Health | Admitting: Adult Health

## 2018-11-11 ENCOUNTER — Other Ambulatory Visit: Payer: Self-pay

## 2018-11-11 DIAGNOSIS — E119 Type 2 diabetes mellitus without complications: Secondary | ICD-10-CM | POA: Insufficient documentation

## 2018-11-11 DIAGNOSIS — K219 Gastro-esophageal reflux disease without esophagitis: Secondary | ICD-10-CM | POA: Diagnosis not present

## 2018-11-11 DIAGNOSIS — Z170 Estrogen receptor positive status [ER+]: Secondary | ICD-10-CM | POA: Diagnosis not present

## 2018-11-11 DIAGNOSIS — C50411 Malignant neoplasm of upper-outer quadrant of right female breast: Secondary | ICD-10-CM | POA: Diagnosis not present

## 2018-11-11 DIAGNOSIS — I10 Essential (primary) hypertension: Secondary | ICD-10-CM | POA: Diagnosis not present

## 2018-11-11 DIAGNOSIS — I080 Rheumatic disorders of both mitral and aortic valves: Secondary | ICD-10-CM | POA: Insufficient documentation

## 2018-11-11 NOTE — Progress Notes (Signed)
  Echocardiogram 2D Echocardiogram has been performed.  Celene Pippins [facility] 11/11/2018, 11:52 AM

## 2018-11-18 MED FILL — CLOPIDOGREL 75 MG TABLET: 75 | 90 days supply | Qty: 90 | Fill #2

## 2018-11-18 MED FILL — ROSUVASTATIN CALCIUM 40 MG: 40 | 90 days supply | Qty: 90 | Fill #3

## 2018-11-18 MED FILL — ANASTROZOLE 1 MG TABLET: 1 | 90 days supply | Qty: 90 | Fill #2

## 2018-11-25 ENCOUNTER — Inpatient Hospital Stay: Payer: Medicare Other | Attending: Hematology and Oncology

## 2018-11-25 ENCOUNTER — Other Ambulatory Visit: Payer: Self-pay

## 2018-11-25 VITALS — BP 136/53 | HR 83 | Temp 98.3°F | Resp 17 | Ht 65.0 in | Wt 195.8 lb

## 2018-11-25 DIAGNOSIS — Z23 Encounter for immunization: Secondary | ICD-10-CM

## 2018-11-25 DIAGNOSIS — Z79811 Long term (current) use of aromatase inhibitors: Secondary | ICD-10-CM | POA: Diagnosis not present

## 2018-11-25 DIAGNOSIS — Z5112 Encounter for antineoplastic immunotherapy: Secondary | ICD-10-CM | POA: Diagnosis not present

## 2018-11-25 DIAGNOSIS — Z170 Estrogen receptor positive status [ER+]: Secondary | ICD-10-CM | POA: Insufficient documentation

## 2018-11-25 DIAGNOSIS — C50411 Malignant neoplasm of upper-outer quadrant of right female breast: Secondary | ICD-10-CM | POA: Insufficient documentation

## 2018-11-25 MED ORDER — SODIUM CHLORIDE 0.9% FLUSH
10.0000 mL | INTRAVENOUS | Status: DC | PRN
  Administered 2018-11-25: 11:00:00 10 mL
  Filled 2018-11-25: qty 10

## 2018-11-25 MED ORDER — DIPHENHYDRAMINE HCL 25 MG PO CAPS
ORAL_CAPSULE | ORAL | Status: AC
  Filled 2018-11-25: qty 2

## 2018-11-25 MED ORDER — TRASTUZUMAB-DKST CHEMO 150 MG IV SOLR
6.0000 mg/kg | Freq: Once | INTRAVENOUS | Status: AC
  Administered 2018-11-25: 10:00:00 525 mg via INTRAVENOUS
  Filled 2018-11-25: qty 25

## 2018-11-25 MED ORDER — INFLUENZA VAC A&B SA ADJ QUAD 0.5 ML IM PRSY
0.5000 mL | PREFILLED_SYRINGE | Freq: Once | INTRAMUSCULAR | Status: AC
  Administered 2018-11-25: 10:00:00 0.5 mL via INTRAMUSCULAR

## 2018-11-25 MED ORDER — ACETAMINOPHEN 325 MG PO TABS
650.0000 mg | ORAL_TABLET | Freq: Once | ORAL | Status: AC
  Administered 2018-11-25: 650 mg via ORAL

## 2018-11-25 MED ORDER — INFLUENZA VAC A&B SA ADJ QUAD 0.5 ML IM PRSY
PREFILLED_SYRINGE | INTRAMUSCULAR | Status: AC
  Filled 2018-11-25: qty 0.5

## 2018-11-25 MED ORDER — SODIUM CHLORIDE 0.9 % IV SOLN
Freq: Once | INTRAVENOUS | Status: AC
  Administered 2018-11-25: 10:00:00 via INTRAVENOUS
  Filled 2018-11-25: qty 250

## 2018-11-25 MED ORDER — DIPHENHYDRAMINE HCL 25 MG PO CAPS
50.0000 mg | ORAL_CAPSULE | Freq: Once | ORAL | Status: AC
  Administered 2018-11-25: 10:00:00 50 mg via ORAL

## 2018-11-25 MED ORDER — HEPARIN SOD (PORK) LOCK FLUSH 100 UNIT/ML IV SOLN
500.0000 [IU] | Freq: Once | INTRAVENOUS | Status: AC | PRN
  Administered 2018-11-25: 11:00:00 500 [IU]
  Filled 2018-11-25: qty 5

## 2018-11-25 MED ORDER — ACETAMINOPHEN 325 MG PO TABS
ORAL_TABLET | ORAL | Status: AC
  Filled 2018-11-25: qty 2

## 2018-11-25 NOTE — Patient Instructions (Signed)
[facility] Discharge Instructions for Patients Receiving Chemotherapy  Today you received the following Immunotherapy agent: Trastuzumab-dkst  To help prevent nausea and vomiting after your treatment, we encourage you to take your nausea medication as directed by your MD.   If you develop nausea and vomiting that is not controlled by your nausea medication, call the clinic.   BELOW ARE SYMPTOMS THAT SHOULD BE REPORTED IMMEDIATELY:  *FEVER GREATER THAN 100.5 F  *CHILLS WITH OR WITHOUT FEVER  NAUSEA AND VOMITING THAT IS NOT CONTROLLED WITH YOUR NAUSEA MEDICATION  *UNUSUAL SHORTNESS OF BREATH  *UNUSUAL BRUISING OR BLEEDING  TENDERNESS IN MOUTH AND THROAT WITH OR WITHOUT PRESENCE OF ULCERS  *URINARY PROBLEMS  *BOWEL PROBLEMS  UNUSUAL RASH Items with * indicate a potential emergency and should be followed up as soon as possible.  Feel free to call the clinic should you have any questions or concerns. The clinic phone number is [phone].  Please show the CHEMO ALERT CARD at check-in to the Emergency Department and triage nurse.  Coronavirus (COVID-19) Are you at risk?  Are you at risk for the Coronavirus (COVID-19)?  To be considered HIGH RISK for Coronavirus (COVID-19), you have to meet the following criteria:  . Traveled to [place], [place], [place], [place] or [place]; or in the [place] to [place], [place], [place], or [place]; and have fever, cough, and shortness of breath within the last 2 weeks of travel OR . Been in close contact with a person diagnosed with COVID-19 within the last 2 weeks and have fever, cough, and shortness of breath . IF YOU DO NOT MEET THESE CRITERIA, YOU ARE CONSIDERED LOW RISK FOR COVID-19.  What to do if you are HIGH RISK for COVID-19?  . If you are having a medical emergency, call 911. . Seek medical care right away. Before you go to a doctor's office, urgent care or emergency department, call ahead and tell  them about your recent travel, contact with someone diagnosed with COVID-19, and your symptoms. You should receive instructions from your physician's office regarding next steps of care.  . When you arrive at healthcare provider, tell the healthcare staff immediately you have returned from visiting [place], [place], [place], [place] or [place]; or traveled in the [place] to [place], [place], [place], or [place]; in the last two weeks or you have been in close contact with a person diagnosed with COVID-19 in the last 2 weeks.   . Tell the health care staff about your symptoms: fever, cough and shortness of breath. . After you have been seen by a medical provider, you will be either: o Tested for (COVID-19) and discharged home on quarantine except to seek medical care if symptoms worsen, and asked to  - Stay home and avoid contact with others until you get your results (4-5 days)  - Avoid travel on public transportation if possible (such as bus, train, or airplane) or o Sent to the Emergency Department by EMS for evaluation, COVID-19 testing, and possible admission depending on your condition and test results.  What to do if you are LOW RISK for COVID-19?  Reduce your risk of any infection by using the same precautions used for avoiding the common cold or flu:  . Wash your hands often with soap and warm water for at least 20 seconds.  If soap and water are not readily available, use an alcohol-based hand sanitizer with at least 60% alcohol.  . If   or sneezing, cover your mouth and nose by coughing or sneezing into the elbow areas of your shirt or coat, into a tissue or into your sleeve (not your hands). . Avoid shaking hands with others and consider head nods or verbal greetings only. . Avoid touching your eyes, nose, or mouth with unwashed hands.  . Avoid close contact with people who are sick. . Avoid places or events with large numbers of people in one location, like concerts or  sporting events. . Carefully consider travel plans you have or are making. . If you are planning any travel outside or inside the [place], visit the CDC's Travelers' Health webpage for the latest health notices. . If you have some symptoms but not all symptoms, continue to monitor at home and seek medical attention if your symptoms worsen. . If you are having a medical emergency, call 911.   [place] / e-Visit: [email or web address]         [facility]: [facility]: [phone]                   [facility]: [phone]

## 2018-12-08 MED FILL — LANTUS SOLOSTAR 100 UNITS/M: 100 | 42 days supply | Qty: 6 | Fill #2

## 2018-12-10 DIAGNOSIS — Z9889 Other specified postprocedural states: Secondary | ICD-10-CM | POA: Diagnosis not present

## 2018-12-10 DIAGNOSIS — E119 Type 2 diabetes mellitus without complications: Secondary | ICD-10-CM | POA: Diagnosis not present

## 2018-12-10 DIAGNOSIS — Z8673 Personal history of transient ischemic attack (TIA), and cerebral infarction without residual deficits: Secondary | ICD-10-CM | POA: Diagnosis not present

## 2018-12-10 DIAGNOSIS — C50411 Malignant neoplasm of upper-outer quadrant of right female breast: Secondary | ICD-10-CM | POA: Diagnosis not present

## 2018-12-10 DIAGNOSIS — I10 Essential (primary) hypertension: Secondary | ICD-10-CM | POA: Diagnosis not present

## 2018-12-10 DIAGNOSIS — Z794 Long term (current) use of insulin: Secondary | ICD-10-CM | POA: Diagnosis not present

## 2018-12-10 DIAGNOSIS — Z7901 Long term (current) use of anticoagulants: Secondary | ICD-10-CM | POA: Diagnosis not present

## 2018-12-15 NOTE — Progress Notes (Signed)
Patient Care Team: Sandra Pier, MD as PCP - General (Internal Medicine) Sandra Skates, MD as Consulting Physician (General Surgery) Sandra Lose, MD as Consulting Physician (Hematology and Oncology) Sandra Pray, MD as Consulting Physician (Radiation Oncology)  DIAGNOSIS:    ICD-10-CM   1. Malignant neoplasm of upper-outer quadrant of right breast in female, estrogen receptor positive ([place])  C50.411    Z17.0     SUMMARY OF ONCOLOGIC HISTORY: Oncology History  Malignant neoplasm of upper-outer quadrant of right breast in female, estrogen receptor positive ([place])  01/27/2018 Mammogram   Screening Mammogram  Possible right breast mass; asymmetry in left breast.   02/10/2018 Initial Diagnosis   Screening mammogram detected right breast mass and asymmetry in the left breast.  The left breast asymmetry resolved.  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%, HER-2 +3+ by IHC, T1BN0 stage Ia clinical stage   03/24/2018 Surgery   Lumpectomy and SLNB: IDC, 1.2cm, grade 2, margins negative, 4 SLN negative, T1cN0, previously ER/PR/HER-2 positive with Ki-67 of 20%   03/24/2018 Cancer Staging   Staging form: Breast, AJCC 8th Edition - Pathologic stage from 03/24/2018: Stage IA (pT1c, pN0, cM0, G2, ER+, PR+, HER2+) - Signed by Sandra Phlegm, NP on 03/26/2018   04/22/2018 - 06/24/2018 Chemotherapy   The patient had trastuzumab (HERCEPTIN) 336 mg in sodium chloride 0.9 % 250 mL chemo infusion, 4 mg/kg = 336 mg, Intravenous,  Once, 3 of 16 cycles Administration: 336 mg (04/22/2018), 168 mg (04/29/2018), 168 mg (05/21/2018), 168 mg (05/06/2018), 168 mg (05/13/2018), 168 mg (05/27/2018), 168 mg (06/03/2018), 168 mg (06/10/2018), 168 mg (06/17/2018), 168 mg (06/24/2018) PACLitaxel (TAXOL) 156 mg in sodium chloride 0.9 % 250 mL chemo infusion (</= 43m/m2), 80 mg/m2 = 156 mg, Intravenous,  Once,  3 of 3 cycles Administration: 156 mg (04/22/2018), 156 mg (04/29/2018), 156 mg (05/21/2018), 156 mg (05/06/2018), 156 mg (05/13/2018), 156 mg (05/27/2018), 156 mg (06/03/2018), 156 mg (06/10/2018), 156 mg (06/17/2018), 156 mg (06/24/2018)  for chemotherapy treatment.    07/01/2018 -  Chemotherapy   The patient had trastuzumab (HERCEPTIN) 525 mg in sodium chloride 0.9 % 250 mL chemo infusion, 6 mg/kg = 525 mg (100 % of original dose 6 mg/kg), Intravenous,  Once, 3 of 3 cycles Dose modification: 6 mg/kg (original dose 6 mg/kg, Cycle 1, Reason: Provider Judgment) Administration: 525 mg (07/01/2018), 525 mg (07/22/2018), 525 mg (08/12/2018) trastuzumab-dkst (OGIVRI) 525 mg in sodium chloride 0.9 % 250 mL chemo infusion, 6 mg/kg = 525 mg (100 % of original dose 6 mg/kg), Intravenous,  Once, 5 of 10 cycles Dose modification: 6 mg/kg (original dose 6 mg/kg, Cycle 4, Reason: Other (see comments), Comment: Biosimilar Conversion) Administration: 525 mg (09/02/2018), 525 mg (09/23/2018), 525 mg (10/14/2018), 525 mg (11/04/2018), 525 mg (11/25/2018)  for chemotherapy treatment.    07/29/2018 - 08/22/2018 Radiation Therapy   Adjuvant XRT   09/05/2018 -  Anti-estrogen oral therapy   Anastrozole 169mdaily, plan for 5-7 years      CHIEF COMPLIANT: [name]  INTERVAL HISTORY: [name] a [age].o. with above-mentioned history of right breast cancer who underwent a lumpectomy, adjuvant chemotherapy and radiation. She is currently on [name] and anti-estrogen therapy with anastrozole. Echo on 11/11/18 showed an ejection fraction of 60-65%. She presents to the clinic today for treatment.   She does not have any adverse effects from anastrozole  or Herceptin.  REVIEW OF SYSTEMS:   Constitutional: Denies fevers, chills or abnormal weight loss Eyes: Denies blurriness of vision Ears, nose, mouth, throat, and face: Denies mucositis or sore throat Respiratory: Denies cough, dyspnea or wheezes  Cardiovascular: Denies palpitation, chest discomfort Gastrointestinal: Denies nausea, heartburn or change in bowel habits Skin: Denies abnormal skin rashes Lymphatics: Denies new lymphadenopathy or easy bruising Neurological: Denies numbness, tingling or new weaknesses Behavioral/Psych: Mood is stable, no new changes  Extremities: No lower extremity edema Breast: denies any pain or lumps or nodules in either breasts All other systems were reviewed with the patient and are negative.  I have reviewed the past medical history, past surgical history, social history and family history with the patient and they are unchanged from previous note.  ALLERGIES:  has No Known Allergies.  MEDICATIONS:  Current Outpatient Medications  Medication Sig Dispense Refill  . amLODipine (NORVASC) 10 MG tablet Take 1 tablet (10 mg total) by mouth daily. 90 tablet 2  . anastrozole (ARIMIDEX) 1 MG tablet Take 1 tablet (1 mg total) by mouth daily. 90 tablet 3  . aspirin 81 MG tablet Take 1 tablet (81 mg total) by mouth daily. 100 tablet 2  . blood glucose meter kit and supplies KIT Dispense based on patient and insurance preference. Use up to four times daily as directed. (FOR ICD-9 250.00, 250.01). 1 each 0  . clopidogrel (PLAVIX) 75 MG tablet Take 1 tablet (75 mg total) by mouth daily. 30 tablet 6  . Insulin Pen Needle (TRUEPLUS PEN NEEDLES) 32G X 4 MM MISC Use as directed to inject insulin 100 each 3  . LANTUS SOLOSTAR 100 UNIT/ML Solostar Pen Inject 7 Units into the skin 2 (two) times daily. 15 mL 6  . lidocaine-prilocaine (EMLA) cream APPLY 1 APPLICATION TOPICALLY AS NEEDED. 30 g 0  . lisinopril (ZESTRIL) 5 MG tablet Take 1 tablet (5 mg total) by mouth daily. 90 tablet 6  . metFORMIN (GLUCOPHAGE) 500 MG tablet Take 1 tablet (500 mg total) by mouth 2 (two) times daily with a meal. 180 tablet 3  . rosuvastatin (CRESTOR) 40 MG tablet TAKE 1 TABLET BY MOUTH DAILY AT 6 PM. 30 tablet 6   No current  facility-administered medications for this visit.     PHYSICAL EXAMINATION: ECOG PERFORMANCE STATUS: 1 - Symptomatic but completely ambulatory  Vitals:   12/16/18 0852  BP: (!) 164/60  Pulse: 94  Resp: 18  Temp: 99.1 F (37.3 C)  SpO2: 100%   Filed Weights   12/16/18 0852  Weight: 196 lb 14.4 oz (89.3 kg)    GENERAL: alert, no distress and comfortable SKIN: skin color, texture, turgor are normal, no rashes or significant lesions EYES: normal, Conjunctiva are pink and non-injected, sclera clear OROPHARYNX: no exudate, no erythema and lips, buccal mucosa, and tongue normal  NECK: supple, thyroid normal size, non-tender, without nodularity LYMPH: no palpable lymphadenopathy in the cervical, axillary or inguinal LUNGS: clear to auscultation and percussion with normal breathing effort HEART: regular rate & rhythm and no murmurs and no lower extremity edema ABDOMEN: abdomen soft, non-tender and normal bowel sounds MUSCULOSKELETAL: no cyanosis of digits and no clubbing  NEURO: alert & oriented x 3 with fluent speech, no focal motor/sensory deficits EXTREMITIES: No lower extremity edema  LABORATORY DATA:  I have reviewed the data as listed CMP Latest Ref Rng & Units 11/04/2018 09/23/2018 08/12/2018  Glucose 70 - 99 mg/dL 234(H) 163(H) 226(H)  BUN 8 - 23 mg/dL 17 15 17  Creatinine 0.44 - 1.00 mg/dL 0.90 0.83 0.93  Sodium 135 - 145 mmol/L 140 141 141  Potassium 3.5 - 5.1 mmol/L 4.0 4.0 3.9  Chloride 98 - 111 mmol/L 109 109 109  CO2 22 - 32 mmol/L 20(L) 24 24  Calcium 8.9 - 10.3 mg/dL 10.2 9.8 9.7  Total Protein 6.5 - 8.1 g/dL 7.3 7.5 7.1  Total Bilirubin 0.3 - 1.2 mg/dL <0.2(L) <0.2(L) <0.2(L)  Alkaline Phos 38 - 126 U/L 59 54 48  AST 15 - 41 U/L 16 23 15   ALT 0 - 44 U/L 22 30 17     Lab Results  Component Value Date   WBC 6.3 12/16/2018   HGB 11.6 (L) 12/16/2018   HCT 36.0 12/16/2018   MCV 84.5 12/16/2018   PLT 277 12/16/2018   NEUTROABS 4.4 12/16/2018    ASSESSMENT &  PLAN:  Malignant neoplasm of upper-outer quadrant of right breast in female, estrogen receptor positive ([place]) 02/04/2018:Screening mammogram detected right breast mass and asymmetry in the left breast. The left breast asymmetry resolved. 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 Adj chemo with Taxol Herceptin weekly X 11 (stopped for peripheral neuropathy)started 04/22/2018-07/01/2018,now on [name] to complete 1 full year of therapy 2. Adjuvant radiation therapy completed 08/22/2018 3. Adjuvant antiestrogen therapy started 09/05/2018 ------------------------------------------------------------------------------------------------------------------------ Current treatment:[name] therapy every 3 weeks, anastrozole 1 mg daily started 09/05/2018 Echo: 11/11/2018 shows an EF of 60-65%  Chemo-induced peripheral neuropathy:Stable  Anastrozole toxicities: none, counseled on bone health  Breast swelling: I recommended she wear a tighter fitting bra, and massage her breast with vitamin E oil to help.  If swelling persists, referral to PT can certainly be placed.  Patient is not experiencing any side effects to Herceptin. Return to clinic every 3 weeks for Herceptin every 6 weeks of follow-up with me    No orders of the defined types were placed in this encounter.  The patient has a good understanding of the overall plan. she agrees with it. she will call with any problems that may develop before the next visit here.  Sandra Lose, MD 12/16/2018  Julious Oka Dorshimer, am acting as scribe for Dr. Nicholas Shelton.  I have reviewed the above documentation for accuracy and  completeness, and I agree with the above.

## 2018-12-16 ENCOUNTER — Inpatient Hospital Stay: Payer: Medicare Other

## 2018-12-16 ENCOUNTER — Other Ambulatory Visit: Payer: Self-pay

## 2018-12-16 ENCOUNTER — Inpatient Hospital Stay: Payer: Medicare Other | Attending: Hematology and Oncology

## 2018-12-16 ENCOUNTER — Inpatient Hospital Stay (HOSPITAL_BASED_OUTPATIENT_CLINIC_OR_DEPARTMENT_OTHER): Payer: Medicare Other | Admitting: Hematology and Oncology

## 2018-12-16 DIAGNOSIS — E119 Type 2 diabetes mellitus without complications: Secondary | ICD-10-CM | POA: Insufficient documentation

## 2018-12-16 DIAGNOSIS — Z5112 Encounter for antineoplastic immunotherapy: Secondary | ICD-10-CM | POA: Diagnosis present

## 2018-12-16 DIAGNOSIS — T451X5A Adverse effect of antineoplastic and immunosuppressive drugs, initial encounter: Secondary | ICD-10-CM | POA: Diagnosis not present

## 2018-12-16 DIAGNOSIS — Z170 Estrogen receptor positive status [ER+]: Secondary | ICD-10-CM

## 2018-12-16 DIAGNOSIS — Z9221 Personal history of antineoplastic chemotherapy: Secondary | ICD-10-CM | POA: Insufficient documentation

## 2018-12-16 DIAGNOSIS — G620 Drug-induced polyneuropathy: Secondary | ICD-10-CM | POA: Diagnosis not present

## 2018-12-16 DIAGNOSIS — C50411 Malignant neoplasm of upper-outer quadrant of right female breast: Secondary | ICD-10-CM | POA: Diagnosis not present

## 2018-12-16 DIAGNOSIS — Z794 Long term (current) use of insulin: Secondary | ICD-10-CM | POA: Insufficient documentation

## 2018-12-16 DIAGNOSIS — Z7982 Long term (current) use of aspirin: Secondary | ICD-10-CM | POA: Insufficient documentation

## 2018-12-16 DIAGNOSIS — Z79899 Other long term (current) drug therapy: Secondary | ICD-10-CM | POA: Diagnosis not present

## 2018-12-16 DIAGNOSIS — Z923 Personal history of irradiation: Secondary | ICD-10-CM | POA: Insufficient documentation

## 2018-12-16 DIAGNOSIS — I6330 Cerebral infarction due to thrombosis of unspecified cerebral artery: Secondary | ICD-10-CM

## 2018-12-16 DIAGNOSIS — C50111 Malignant neoplasm of central portion of right female breast: Secondary | ICD-10-CM

## 2018-12-16 DIAGNOSIS — Z79811 Long term (current) use of aromatase inhibitors: Secondary | ICD-10-CM | POA: Diagnosis not present

## 2018-12-16 DIAGNOSIS — Z95828 Presence of other vascular implants and grafts: Secondary | ICD-10-CM

## 2018-12-16 LAB — CMP (CANCER CENTER ONLY)
ALT: 23 U/L (ref 0–44)
AST: 18 U/L (ref 15–41)
Albumin: 3.7 g/dL (ref 3.5–5.0)
Alkaline Phosphatase: 55 U/L (ref 38–126)
Anion gap: 9 (ref 5–15)
BUN: 16 mg/dL (ref 8–23)
CO2: 23 mmol/L (ref 22–32)
Calcium: 9.5 mg/dL (ref 8.9–10.3)
Chloride: 108 mmol/L (ref 98–111)
Creatinine: 1.01 mg/dL — ABNORMAL HIGH (ref 0.44–1.00)
GFR, Est AFR Am: >60 mL/min (ref >60)
GFR, Estimated: 58 mL/min — ABNORMAL LOW (ref >60)
Glucose, Bld: 262 mg/dL — ABNORMAL HIGH (ref 70–99)
Potassium: 4.2 mmol/L (ref 3.5–5.1)
Sodium: 140 mmol/L (ref 135–145)
Total Bilirubin: <0.2 mg/dL — ABNORMAL LOW (ref 0.3–1.2)
Total Protein: 7.1 g/dL (ref 6.5–8.1)

## 2018-12-16 LAB — CBC WITH DIFFERENTIAL (CANCER CENTER ONLY)
Abs Immature Granulocytes: 0.01 10*3/uL (ref 0.00–0.07)
Basophils Absolute: 0 10*3/uL (ref 0.0–0.1)
Basophils Relative: 1 %
Eosinophils Absolute: 0.1 10*3/uL (ref 0.0–0.5)
Eosinophils Relative: 2 %
HCT: 36 % (ref 36.0–46.0)
Hemoglobin: 11.6 g/dL — ABNORMAL LOW (ref 12.0–15.0)
Immature Granulocytes: 0 %
Lymphocytes Relative: 22 %
Lymphs Abs: 1.4 10*3/uL (ref 0.7–4.0)
MCH: 27.2 pg (ref 26.0–34.0)
MCHC: 32.2 g/dL (ref 30.0–36.0)
MCV: 84.5 fL (ref 80.0–100.0)
Monocytes Absolute: 0.5 10*3/uL (ref 0.1–1.0)
Monocytes Relative: 7 %
Neutro Abs: 4.4 10*3/uL (ref 1.7–7.7)
Neutrophils Relative %: 68 %
Platelet Count: 277 10*3/uL (ref 150–400)
RBC: 4.26 MIL/uL (ref 3.87–5.11)
RDW: 13.2 % (ref 11.5–15.5)
WBC Count: 6.3 10*3/uL (ref 4.0–10.5)
nRBC: 0 % (ref 0.0–0.2)

## 2018-12-16 MED ORDER — DIPHENHYDRAMINE HCL 25 MG PO CAPS
50.0000 mg | ORAL_CAPSULE | Freq: Once | ORAL | Status: AC
  Administered 2018-12-16: 50 mg via ORAL

## 2018-12-16 MED ORDER — ACETAMINOPHEN 325 MG PO TABS
ORAL_TABLET | ORAL | Status: AC
  Filled 2018-12-16: qty 2

## 2018-12-16 MED ORDER — SODIUM CHLORIDE 0.9% FLUSH
10.0000 mL | INTRAVENOUS | Status: DC | PRN
  Administered 2018-12-16: 10 mL
  Filled 2018-12-16: qty 10

## 2018-12-16 MED ORDER — SODIUM CHLORIDE 0.9% FLUSH
10.0000 mL | Freq: Once | INTRAVENOUS | Status: AC
  Administered 2018-12-16: 10 mL
  Filled 2018-12-16: qty 10

## 2018-12-16 MED ORDER — HEPARIN SOD (PORK) LOCK FLUSH 100 UNIT/ML IV SOLN
500.0000 [IU] | Freq: Once | INTRAVENOUS | Status: AC | PRN
  Administered 2018-12-16: 500 [IU]
  Filled 2018-12-16: qty 5

## 2018-12-16 MED ORDER — ACETAMINOPHEN 325 MG PO TABS
650.0000 mg | ORAL_TABLET | Freq: Once | ORAL | Status: AC
  Administered 2018-12-16: 10:00:00 650 mg via ORAL

## 2018-12-16 MED ORDER — DIPHENHYDRAMINE HCL 25 MG PO CAPS
ORAL_CAPSULE | ORAL | Status: AC
  Filled 2018-12-16: qty 2

## 2018-12-16 MED ORDER — SODIUM CHLORIDE 0.9 % IV SOLN
Freq: Once | INTRAVENOUS | Status: AC
  Administered 2018-12-16: 10:00:00 via INTRAVENOUS
  Filled 2018-12-16: qty 250

## 2018-12-16 MED ORDER — TRASTUZUMAB-DKST CHEMO 150 MG IV SOLR
6.0000 mg/kg | Freq: Once | INTRAVENOUS | Status: AC
  Administered 2018-12-16: 525 mg via INTRAVENOUS
  Filled 2018-12-16: qty 25

## 2018-12-16 NOTE — Patient Instructions (Signed)
[facility] Discharge Instructions for Patients Receiving Chemotherapy  Today you received the following Immunotherapy agent: Trastuzumab-dkst  To help prevent nausea and vomiting after your treatment, we encourage you to take your nausea medication as directed by your MD.   If you develop nausea and vomiting that is not controlled by your nausea medication, call the clinic.   BELOW ARE SYMPTOMS THAT SHOULD BE REPORTED IMMEDIATELY:  *FEVER GREATER THAN 100.5 F  *CHILLS WITH OR WITHOUT FEVER  NAUSEA AND VOMITING THAT IS NOT CONTROLLED WITH YOUR NAUSEA MEDICATION  *UNUSUAL SHORTNESS OF BREATH  *UNUSUAL BRUISING OR BLEEDING  TENDERNESS IN MOUTH AND THROAT WITH OR WITHOUT PRESENCE OF ULCERS  *URINARY PROBLEMS  *BOWEL PROBLEMS  UNUSUAL RASH Items with * indicate a potential emergency and should be followed up as soon as possible.  Feel free to call the clinic should you have any questions or concerns. The clinic phone number is [phone].  Please show the [place] at check-in to the Emergency Department and triage nurse.

## 2018-12-16 NOTE — Assessment & Plan Note (Signed)
02/04/2018:Screening mammogram detected right breast mass and asymmetry in the left breast. The left breast asymmetry resolved. 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 Adj chemo with Taxol Herceptin weekly X 11 (stopped for peripheral neuropathy)started 04/22/2018-07/01/2018,now on [name] to complete 1 full year of therapy 2. Adjuvant radiation therapy completed 08/22/2018 3. Adjuvant antiestrogen therapy started 09/05/2018 ------------------------------------------------------------------------------------------------------------------------ Current treatment:[name] therapy every 3 weeks, anastrozole 1 mg daily started 09/05/2018 Echo: 11/11/2018 shows an EF of 60-65%  Chemo-induced peripheral neuropathy:Stable  Anastrozole toxicities: none, counseled on bone health  Breast swelling: I recommended she wear a tighter fitting bra, and massage her breast with vitamin E oil to help.  If swelling persists, referral to PT can certainly be placed.  Patient is not experiencing any side effects to Herceptin. Return to clinic every 3 weeks for Herceptin every 6 weeks of follow-up with me

## 2019-01-02 MED FILL — AMLODIPINE BESYLATE 10 MG T: 10 | 90 days supply | Qty: 90 | Fill #1

## 2019-01-02 MED FILL — metFORMIN HCL 500 MG TABS: 500 | 90 days supply | Qty: 180 | Fill #1

## 2019-01-06 ENCOUNTER — Inpatient Hospital Stay: Payer: Medicare Other

## 2019-01-06 ENCOUNTER — Other Ambulatory Visit: Payer: Self-pay

## 2019-01-06 VITALS — BP 133/64 | HR 94 | Temp 98.7°F | Resp 16 | Wt 195.5 lb

## 2019-01-06 DIAGNOSIS — Z5112 Encounter for antineoplastic immunotherapy: Secondary | ICD-10-CM | POA: Diagnosis not present

## 2019-01-06 DIAGNOSIS — C50411 Malignant neoplasm of upper-outer quadrant of right female breast: Secondary | ICD-10-CM

## 2019-01-06 MED ORDER — TRASTUZUMAB-DKST CHEMO 150 MG IV SOLR
6.0000 mg/kg | Freq: Once | INTRAVENOUS | Status: AC
  Administered 2019-01-06: 525 mg via INTRAVENOUS
  Filled 2019-01-06: qty 25

## 2019-01-06 MED ORDER — HEPARIN SOD (PORK) LOCK FLUSH 100 UNIT/ML IV SOLN
500.0000 [IU] | Freq: Once | INTRAVENOUS | Status: AC | PRN
  Administered 2019-01-06: 500 [IU]
  Filled 2019-01-06: qty 5

## 2019-01-06 MED ORDER — DIPHENHYDRAMINE HCL 25 MG PO CAPS
50.0000 mg | ORAL_CAPSULE | Freq: Once | ORAL | Status: AC
  Administered 2019-01-06: 50 mg via ORAL

## 2019-01-06 MED ORDER — DIPHENHYDRAMINE HCL 25 MG PO CAPS
ORAL_CAPSULE | ORAL | Status: AC
  Filled 2019-01-06: qty 2

## 2019-01-06 MED ORDER — ACETAMINOPHEN 325 MG PO TABS
650.0000 mg | ORAL_TABLET | Freq: Once | ORAL | Status: AC
  Administered 2019-01-06: 650 mg via ORAL

## 2019-01-06 MED ORDER — SODIUM CHLORIDE 0.9% FLUSH
10.0000 mL | INTRAVENOUS | Status: DC | PRN
  Administered 2019-01-06: 10 mL
  Filled 2019-01-06: qty 10

## 2019-01-06 MED ORDER — SODIUM CHLORIDE 0.9 % IV SOLN
Freq: Once | INTRAVENOUS | Status: AC
  Filled 2019-01-06: qty 250

## 2019-01-06 MED ORDER — ACETAMINOPHEN 325 MG PO TABS
ORAL_TABLET | ORAL | Status: AC
  Filled 2019-01-06: qty 2

## 2019-01-06 NOTE — Patient Instructions (Signed)
[facility] Discharge Instructions for Patients Receiving Chemotherapy  Today you received the following chemotherapy agent: Trastuzumab To help prevent nausea and vomiting after your treatment, we encourage you to take your nausea medication as directed by your MD.   If you develop nausea and vomiting that is not controlled by your nausea medication, call the clinic.   BELOW ARE SYMPTOMS THAT SHOULD BE REPORTED IMMEDIATELY:  *FEVER GREATER THAN 100.5 F  *CHILLS WITH OR WITHOUT FEVER  NAUSEA AND VOMITING THAT IS NOT CONTROLLED WITH YOUR NAUSEA MEDICATION  *UNUSUAL SHORTNESS OF BREATH  *UNUSUAL BRUISING OR BLEEDING  TENDERNESS IN MOUTH AND THROAT WITH OR WITHOUT PRESENCE OF ULCERS  *URINARY PROBLEMS  *BOWEL PROBLEMS  UNUSUAL RASH Items with * indicate a potential emergency and should be followed up as soon as possible.  Feel free to call the clinic should you have any questions or concerns. The clinic phone number is [phone].  Please show the [facility] at check-in to the Emergency Department and triage nurse.  Coronavirus (COVID-19) Are you at risk?  Are you at risk for the Coronavirus (COVID-19)?  To be considered HIGH RISK for Coronavirus (COVID-19), you have to meet the following criteria:  . Traveled to [place], [place], [place], [place] or [place]; or in the [place] to [place], [place], [place], or [place]; and have fever, cough, and shortness of breath within the last 2 weeks of travel OR . Been in close contact with a person diagnosed with COVID-19 within the last 2 weeks and have fever, cough, and shortness of breath . IF YOU DO NOT MEET THESE CRITERIA, YOU ARE CONSIDERED LOW RISK FOR COVID-19.  What to do if you are HIGH RISK for COVID-19?  Marland Kitchen If you are having a medical emergency, call 911. . Seek medical care right away. Before you go to a doctor's office, urgent care or emergency department, call ahead and tell them about  your recent travel, contact with someone diagnosed with COVID-19, and your symptoms. You should receive instructions from your physician's office regarding next steps of care.  . When you arrive at healthcare provider, tell the healthcare staff immediately you have returned from visiting [place], [place], [place], [place] or [place]; or traveled in the [place] to [place], [place], [place], or [place]; in the last two weeks or you have been in close contact with a person diagnosed with COVID-19 in the last 2 weeks.   . Tell the health care staff about your symptoms: fever, cough and shortness of breath. . After you have been seen by a medical provider, you will be either: o Tested for (COVID-19) and discharged home on quarantine except to seek medical care if symptoms worsen, and asked to  - Stay home and avoid contact with others until you get your results (4-5 days)  - Avoid travel on public transportation if possible (such as bus, train, or airplane) or o Sent to the Emergency Department by EMS for evaluation, COVID-19 testing, and possible admission depending on your condition and test results.  What to do if you are LOW RISK for COVID-19?  Reduce your risk of any infection by using the same precautions used for avoiding the common cold or flu:  Marland Kitchen Wash your hands often with soap and warm water for at least 20 seconds.  If soap and water are not readily available, use an alcohol-based hand sanitizer with at least 60% alcohol.  . If coughing  or sneezing, cover your mouth and nose by coughing or sneezing into the elbow areas of your shirt or coat, into a tissue or into your sleeve (not your hands). . Avoid shaking hands with others and consider head nods or verbal greetings only. . Avoid touching your eyes, nose, or mouth with unwashed hands.  . Avoid close contact with people who are sick. . Avoid places or events with large numbers of people in one location, like concerts or sporting  events. . Carefully consider travel plans you have or are making. . If you are planning any travel outside or inside the [place], visit the CDC's Travelers' Health webpage for the latest health notices. . If you have some symptoms but not all symptoms, continue to monitor at home and seek medical attention if your symptoms worsen. . If you are having a medical emergency, call 911.   [facility] / e-Visit: [email or web address]         [facility]: [facility]: [id number]                   [facility]: [phone]

## 2019-01-12 MED FILL — LISINOPRIL 5 MG TABLET: 5 | 90 days supply | Qty: 90 | Fill #0

## 2019-01-12 MED FILL — LANTUS SOLOSTAR 100 UNITS/M: 100 | 104 days supply | Qty: 15 | Fill #3

## 2019-01-26 NOTE — Progress Notes (Signed)
Patient Care Team: [name], MD as PCP - General (Internal Medicine) Fanny Skates, MD as Consulting Physician (General Surgery) Nicholas Lose, MD as Consulting Physician (Hematology and Oncology) Gery Pray, MD as Consulting Physician (Radiation Oncology)  DIAGNOSIS:    ICD-10-CM   1. Malignant neoplasm of upper-outer quadrant of right breast in female, estrogen receptor positive ([name])  C50.411    Z17.0     SUMMARY OF ONCOLOGIC HISTORY: Oncology History  Malignant neoplasm of upper-outer quadrant of right breast in female, estrogen receptor positive ([name])  01/27/2018 Mammogram   Screening Mammogram  Possible right breast mass; asymmetry in left breast.   02/10/2018 Initial Diagnosis   Screening mammogram detected right breast mass and asymmetry in the left breast.  The left breast asymmetry resolved.  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%, HER-2 +3+ by IHC, T1BN0 stage Ia clinical stage   03/24/2018 Surgery   Lumpectomy and SLNB: IDC, 1.2cm, grade 2, margins negative, 4 SLN negative, T1cN0, previously ER/PR/HER-2 positive with Ki-67 of 20%   03/24/2018 Cancer Staging   Staging form: Breast, AJCC 8th Edition - Pathologic stage from 03/24/2018: Stage IA (pT1c, pN0, cM0, G2, ER+, PR+, HER2+) - Signed by Gardenia Phlegm, NP on 03/26/2018   04/22/2018 - 06/24/2018 Chemotherapy   The patient had trastuzumab (HERCEPTIN) 336 mg in sodium chloride 0.9 % 250 mL chemo infusion, 4 mg/kg = 336 mg, Intravenous,  Once, 3 of 16 cycles Administration: 336 mg (04/22/2018), 168 mg (04/29/2018), 168 mg (05/21/2018), 168 mg (05/06/2018), 168 mg (05/13/2018), 168 mg (05/27/2018), 168 mg (06/03/2018), 168 mg (06/10/2018), 168 mg (06/17/2018), 168 mg (06/24/2018) PACLitaxel (TAXOL) 156 mg in sodium chloride 0.9 % 250 mL chemo infusion (</= 67m/m2), 80 mg/m2 = 156 mg, Intravenous,  Once,  3 of 3 cycles Administration: 156 mg (04/22/2018), 156 mg (04/29/2018), 156 mg (05/21/2018), 156 mg (05/06/2018), 156 mg (05/13/2018), 156 mg (05/27/2018), 156 mg (06/03/2018), 156 mg (06/10/2018), 156 mg (06/17/2018), 156 mg (06/24/2018)  for chemotherapy treatment.    07/01/2018 -  Chemotherapy   The patient had trastuzumab (HERCEPTIN) 525 mg in sodium chloride 0.9 % 250 mL chemo infusion, 6 mg/kg = 525 mg (100 % of original dose 6 mg/kg), Intravenous,  Once, 3 of 3 cycles Dose modification: 6 mg/kg (original dose 6 mg/kg, Cycle 1, Reason: Provider Judgment) Administration: 525 mg (07/01/2018), 525 mg (07/22/2018), 525 mg (08/12/2018) trastuzumab-dkst (OGIVRI) 525 mg in sodium chloride 0.9 % 250 mL chemo infusion, 6 mg/kg = 525 mg (100 % of original dose 6 mg/kg), Intravenous,  Once, 7 of 11 cycles Dose modification: 6 mg/kg (original dose 6 mg/kg, Cycle 4, Reason: Other (see comments), Comment: Biosimilar Conversion) Administration: 525 mg (09/02/2018), 525 mg (09/23/2018), 525 mg (10/14/2018), 525 mg (11/04/2018), 525 mg (11/25/2018), 525 mg (12/16/2018), 525 mg (01/06/2019)  for chemotherapy treatment.    07/29/2018 - 08/22/2018 Radiation Therapy   Adjuvant XRT   09/05/2018 -  Anti-estrogen oral therapy   Anastrozole 141mdaily, plan for 5-7 years      CHIEF COMPLIANT: [name]  INTERVAL HISTORY: Sandra Shelton a [age].o. with above-mentioned history of right breast cancer who underwent a lumpectomy, adjuvant chemotherapy, and radiation.She is currently on [name] and anti-estrogen therapy with anastrozole. She presents to the clinic todayfor treatment.   ALLERGIES:  has No Known Allergies.  MEDICATIONS:  Current Outpatient Medications  Medication  Sig Dispense Refill  . amLODipine (NORVASC) 10 MG tablet Take 1 tablet (10 mg total) by mouth daily. 90 tablet 2  . anastrozole (ARIMIDEX) 1 MG tablet Take 1 tablet (1 mg total) by mouth daily. 90 tablet 3  . aspirin 81 MG tablet Take  1 tablet (81 mg total) by mouth daily. 100 tablet 2  . blood glucose meter kit and supplies KIT Dispense based on patient and insurance preference. Use up to four times daily as directed. (FOR ICD-9 250.00, 250.01). 1 each 0  . clopidogrel (PLAVIX) 75 MG tablet Take 1 tablet (75 mg total) by mouth daily. 30 tablet 6  . Insulin Pen Needle (TRUEPLUS PEN NEEDLES) 32G X 4 MM MISC Use as directed to inject insulin 100 each 3  . LANTUS SOLOSTAR 100 UNIT/ML Solostar Pen Inject 7 Units into the skin 2 (two) times daily. 15 mL 6  . lidocaine-prilocaine (EMLA) cream APPLY 1 APPLICATION TOPICALLY AS NEEDED. 30 g 0  . lisinopril (ZESTRIL) 5 MG tablet Take 1 tablet (5 mg total) by mouth daily. 90 tablet 6  . metFORMIN (GLUCOPHAGE) 500 MG tablet Take 1 tablet (500 mg total) by mouth 2 (two) times daily with a meal. 180 tablet 3  . rosuvastatin (CRESTOR) 40 MG tablet TAKE 1 TABLET BY MOUTH DAILY AT 6 PM. 30 tablet 6   No current facility-administered medications for this visit.    PHYSICAL EXAMINATION: ECOG PERFORMANCE STATUS: 1 - Symptomatic but completely ambulatory  Vitals:   01/27/19 0835  BP: (!) 137/56  Pulse: 85  Resp: 18  Temp: 97.8 F (36.6 C)  SpO2: 100%   Filed Weights   01/27/19 0835  Weight: 196 lb 9.6 oz (89.2 kg)    LABORATORY DATA:  I have reviewed the data as listed CMP Latest Ref Rng & Units 12/16/2018 11/04/2018 09/23/2018  Glucose 70 - 99 mg/dL 262(H) 234(H) 163(H)  BUN 8 - 23 mg/dL 16 17 15   Creatinine 0.44 - 1.00 mg/dL 1.01(H) 0.90 0.83  Sodium 135 - 145 mmol/L 140 140 141  Potassium 3.5 - 5.1 mmol/L 4.2 4.0 4.0  Chloride 98 - 111 mmol/L 108 109 109  CO2 22 - 32 mmol/L 23 20(L) 24  Calcium 8.9 - 10.3 mg/dL 9.5 10.2 9.8  Total Protein 6.5 - 8.1 g/dL 7.1 7.3 7.5  Total Bilirubin 0.3 - 1.2 mg/dL <0.2(L) <0.2(L) <0.2(L)  Alkaline Phos 38 - 126 U/L 55 59 54  AST 15 - 41 U/L 18 16 23   ALT 0 - 44 U/L 23 22 30     Lab Results  Component Value Date   WBC 6.3 12/16/2018    HGB 11.6 (L) 12/16/2018   HCT 36.0 12/16/2018   MCV 84.5 12/16/2018   PLT 277 12/16/2018   NEUTROABS 4.4 12/16/2018    ASSESSMENT & PLAN:  Malignant neoplasm of upper-outer quadrant of right breast in female, estrogen receptor positive ([place]) 02/04/2018:Screening mammogram detected right breast mass and asymmetry in the left breast. The left breast asymmetry resolved. 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 Adj chemo with Taxol Herceptin weekly X 11 (stopped for peripheral neuropathy)started 04/22/2018-07/01/2018,now on [name] to complete 1 full  year of therapy 2. Adjuvant radiation therapy completed 08/22/2018 3. Adjuvant antiestrogen therapy started 09/05/2018 ------------------------------------------------------------------------------------------------------------------------ Current treatment:[name] therapy every 3 weeks, anastrozole 1 mg daily started 09/05/2018 Echo: 11/11/2018 shows an EF of 60-65%, new echocardiogram will be requested Will complete [name] March 2021  Chemo-induced peripheral neuropathy: Monitoring closely, stable to slightly improved Anastrozole toxicities: Denies any hot flashes or myalgias.  She will need a bone density test every 2 years.  Diabetes: I discussed with her about the importance of bringing her blood sugars down with diet exercise and her medications. We talked about intermittent fasting as another method to decrease the weight.  Breast discomfort: Related to prior surgery and radiation.  We discussed referral to physical therapy. Return to clinic every 3 weeks for Herceptin every 6 weeks for follow-up  with me    No orders of the defined types were placed in this encounter.  The patient has a good understanding of the overall plan. she agrees with it. she will call with any problems that may develop before the next visit here.  Total time spent: 30 mins including face to face time and time spent for planning, charting and coordination of care  Nicholas Lose, MD 01/27/2019  I, Cloyde Reams Dorshimer, am acting as scribe for Dr. Nicholas Lose.  I have reviewed the above documentation for accuracy and completeness, and I agree with the above.

## 2019-01-27 ENCOUNTER — Other Ambulatory Visit: Payer: Self-pay

## 2019-01-27 ENCOUNTER — Other Ambulatory Visit: Payer: Self-pay | Admitting: *Deleted

## 2019-01-27 ENCOUNTER — Inpatient Hospital Stay: Payer: Medicare Other

## 2019-01-27 ENCOUNTER — Inpatient Hospital Stay (HOSPITAL_BASED_OUTPATIENT_CLINIC_OR_DEPARTMENT_OTHER): Payer: Medicare Other | Admitting: Hematology and Oncology

## 2019-01-27 ENCOUNTER — Inpatient Hospital Stay: Payer: Medicare Other | Attending: Hematology and Oncology

## 2019-01-27 DIAGNOSIS — Z794 Long term (current) use of insulin: Secondary | ICD-10-CM | POA: Diagnosis not present

## 2019-01-27 DIAGNOSIS — Z95828 Presence of other vascular implants and grafts: Secondary | ICD-10-CM

## 2019-01-27 DIAGNOSIS — Z170 Estrogen receptor positive status [ER+]: Secondary | ICD-10-CM

## 2019-01-27 DIAGNOSIS — E119 Type 2 diabetes mellitus without complications: Secondary | ICD-10-CM | POA: Diagnosis not present

## 2019-01-27 DIAGNOSIS — Z79811 Long term (current) use of aromatase inhibitors: Secondary | ICD-10-CM | POA: Insufficient documentation

## 2019-01-27 DIAGNOSIS — Z9221 Personal history of antineoplastic chemotherapy: Secondary | ICD-10-CM | POA: Insufficient documentation

## 2019-01-27 DIAGNOSIS — C50411 Malignant neoplasm of upper-outer quadrant of right female breast: Secondary | ICD-10-CM

## 2019-01-27 DIAGNOSIS — Z7902 Long term (current) use of antithrombotics/antiplatelets: Secondary | ICD-10-CM | POA: Diagnosis not present

## 2019-01-27 DIAGNOSIS — G620 Drug-induced polyneuropathy: Secondary | ICD-10-CM | POA: Insufficient documentation

## 2019-01-27 DIAGNOSIS — Z923 Personal history of irradiation: Secondary | ICD-10-CM | POA: Diagnosis not present

## 2019-01-27 DIAGNOSIS — Z5112 Encounter for antineoplastic immunotherapy: Secondary | ICD-10-CM | POA: Diagnosis not present

## 2019-01-27 DIAGNOSIS — C50111 Malignant neoplasm of central portion of right female breast: Secondary | ICD-10-CM

## 2019-01-27 DIAGNOSIS — T451X5A Adverse effect of antineoplastic and immunosuppressive drugs, initial encounter: Secondary | ICD-10-CM | POA: Insufficient documentation

## 2019-01-27 DIAGNOSIS — Z79899 Other long term (current) drug therapy: Secondary | ICD-10-CM | POA: Insufficient documentation

## 2019-01-27 DIAGNOSIS — Z5181 Encounter for therapeutic drug level monitoring: Secondary | ICD-10-CM

## 2019-01-27 DIAGNOSIS — Z7982 Long term (current) use of aspirin: Secondary | ICD-10-CM | POA: Insufficient documentation

## 2019-01-27 LAB — CMP (CANCER CENTER ONLY)
ALT: 22 U/L (ref 0–44)
AST: 15 U/L (ref 15–41)
Albumin: 3.9 g/dL (ref 3.5–5.0)
Alkaline Phosphatase: 59 U/L (ref 38–126)
Anion gap: 14 (ref 5–15)
BUN: 18 mg/dL (ref 8–23)
CO2: 20 mmol/L — ABNORMAL LOW (ref 22–32)
Calcium: 9.4 mg/dL (ref 8.9–10.3)
Chloride: 107 mmol/L (ref 98–111)
Creatinine: 1.05 mg/dL — ABNORMAL HIGH (ref 0.44–1.00)
GFR, Est AFR Am: >60 mL/min (ref >60)
GFR, Estimated: 55 mL/min — ABNORMAL LOW (ref >60)
Glucose, Bld: 315 mg/dL — ABNORMAL HIGH (ref 70–99)
Potassium: 4.2 mmol/L (ref 3.5–5.1)
Sodium: 141 mmol/L (ref 135–145)
Total Bilirubin: <0.2 mg/dL — ABNORMAL LOW (ref 0.3–1.2)
Total Protein: 7.5 g/dL (ref 6.5–8.1)

## 2019-01-27 LAB — CBC WITH DIFFERENTIAL (CANCER CENTER ONLY)
Abs Immature Granulocytes: 0.01 10*3/uL (ref 0.00–0.07)
Basophils Absolute: 0 10*3/uL (ref 0.0–0.1)
Basophils Relative: 1 %
Eosinophils Absolute: 0.1 10*3/uL (ref 0.0–0.5)
Eosinophils Relative: 2 %
HCT: 37.6 % (ref 36.0–46.0)
Hemoglobin: 11.9 g/dL — ABNORMAL LOW (ref 12.0–15.0)
Immature Granulocytes: 0 %
Lymphocytes Relative: 26 %
Lymphs Abs: 1.5 10*3/uL (ref 0.7–4.0)
MCH: 26.9 pg (ref 26.0–34.0)
MCHC: 31.6 g/dL (ref 30.0–36.0)
MCV: 85.1 fL (ref 80.0–100.0)
Monocytes Absolute: 0.5 10*3/uL (ref 0.1–1.0)
Monocytes Relative: 8 %
Neutro Abs: 3.7 10*3/uL (ref 1.7–7.7)
Neutrophils Relative %: 63 %
Platelet Count: 290 10*3/uL (ref 150–400)
RBC: 4.42 MIL/uL (ref 3.87–5.11)
RDW: 13.1 % (ref 11.5–15.5)
WBC Count: 5.8 10*3/uL (ref 4.0–10.5)
nRBC: 0 % (ref 0.0–0.2)

## 2019-01-27 MED ORDER — SODIUM CHLORIDE 0.9% FLUSH
10.0000 mL | INTRAVENOUS | Status: DC | PRN
  Administered 2019-01-27: 11:00:00 10 mL via INTRAVENOUS
  Filled 2019-01-27: qty 10

## 2019-01-27 MED ORDER — TRASTUZUMAB-DKST CHEMO 150 MG IV SOLR
6.0000 mg/kg | Freq: Once | INTRAVENOUS | Status: AC
  Administered 2019-01-27: 10:00:00 525 mg via INTRAVENOUS
  Filled 2019-01-27: qty 25

## 2019-01-27 MED ORDER — DIPHENHYDRAMINE HCL 25 MG PO CAPS
50.0000 mg | ORAL_CAPSULE | Freq: Once | ORAL | Status: AC
  Administered 2019-01-27: 10:00:00 50 mg via ORAL

## 2019-01-27 MED ORDER — DIPHENHYDRAMINE HCL 25 MG PO CAPS
ORAL_CAPSULE | ORAL | Status: AC
  Filled 2019-01-27: qty 2

## 2019-01-27 MED ORDER — SODIUM CHLORIDE 0.9 % IV SOLN
Freq: Once | INTRAVENOUS | Status: AC
  Filled 2019-01-27: qty 250

## 2019-01-27 MED ORDER — ACETAMINOPHEN 325 MG PO TABS
650.0000 mg | ORAL_TABLET | Freq: Once | ORAL | Status: AC
  Administered 2019-01-27: 650 mg via ORAL

## 2019-01-27 MED ORDER — ACETAMINOPHEN 325 MG PO TABS
ORAL_TABLET | ORAL | Status: AC
  Filled 2019-01-27: qty 2

## 2019-01-27 MED ORDER — SODIUM CHLORIDE 0.9% FLUSH
10.0000 mL | Freq: Once | INTRAVENOUS | Status: AC
  Administered 2019-01-27: 10 mL
  Filled 2019-01-27: qty 10

## 2019-01-27 MED ORDER — HEPARIN SOD (PORK) LOCK FLUSH 100 UNIT/ML IV SOLN
500.0000 [IU] | Freq: Once | INTRAVENOUS | Status: AC
  Administered 2019-01-27: 500 [IU] via INTRAVENOUS
  Filled 2019-01-27: qty 5

## 2019-01-27 NOTE — Patient Instructions (Signed)
[facility] Discharge Instructions for Patients Receiving Chemotherapy  Today you received the following chemotherapy agents Trastuzumab.  To help prevent nausea and vomiting after your treatment, we encourage you to take your nausea medication as directed.   If you develop nausea and vomiting that is not controlled by your nausea medication, call the clinic.   BELOW ARE SYMPTOMS THAT SHOULD BE REPORTED IMMEDIATELY:  *FEVER GREATER THAN 100.5 F  *CHILLS WITH OR WITHOUT FEVER  NAUSEA AND VOMITING THAT IS NOT CONTROLLED WITH YOUR NAUSEA MEDICATION  *UNUSUAL SHORTNESS OF BREATH  *UNUSUAL BRUISING OR BLEEDING  TENDERNESS IN MOUTH AND THROAT WITH OR WITHOUT PRESENCE OF ULCERS  *URINARY PROBLEMS  *BOWEL PROBLEMS  UNUSUAL RASH Items with * indicate a potential emergency and should be followed up as soon as possible.  Feel free to call the clinic you have any questions or concerns. The clinic phone number is [phone].  Please show the CHEMO ALERT CARD at check-in to the Emergency Department and triage nurse.    

## 2019-01-27 NOTE — Assessment & Plan Note (Signed)
02/04/2018:Screening mammogram detected right breast mass and asymmetry in the left breast. The left breast asymmetry resolved. 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 Adj chemo with Taxol Herceptin weekly X 11 (stopped for peripheral neuropathy)started 04/22/2018-07/01/2018,now on [name] to complete 1 full year of therapy 2. Adjuvant radiation therapy completed 08/22/2018 3. Adjuvant antiestrogen therapy started 09/05/2018 ------------------------------------------------------------------------------------------------------------------------ Current treatment:[name] therapy every 3 weeks, anastrozole 1 mg daily started 09/05/2018 Echo: 11/11/2018 shows an EF of 60-65%, new echocardiogram will be requested Will complete [name] March 2021  Chemo-induced peripheral neuropathy: Monitoring closely, stable to slightly improved Anastrozole toxicities: Denies any hot flashes or myalgias.  She will need a bone density test every 2 years.  Breast discomfort: Related to prior surgery and radiation.  We discussed referral to physical therapy. Return to clinic every 3 weeks for Herceptin every 6 weeks for follow-up with me

## 2019-01-29 ENCOUNTER — Ambulatory Visit: Payer: Medicare Other | Admitting: Adult Health

## 2019-02-04 MED FILL — CLOPIDOGREL 75 MG TABLET: 75 | 60 days supply | Qty: 60 | Fill #3

## 2019-02-04 MED FILL — ROSUVASTATIN CALCIUM 40 MG: 40 | 30 days supply | Qty: 30 | Fill #4

## 2019-02-04 MED FILL — ANASTROZOLE 1 MG TABLET: 1 | 90 days supply | Qty: 90 | Fill #3

## 2019-02-12 ENCOUNTER — Ambulatory Visit (HOSPITAL_COMMUNITY)
Admission: RE | Admit: 2019-02-12 | Discharge: 2019-02-12 | Disposition: A | Discharge status: Home / Self Care | Payer: Medicare Other | Source: Ambulatory Visit | Attending: Hematology and Oncology | Admitting: Hematology and Oncology

## 2019-02-12 ENCOUNTER — Other Ambulatory Visit: Payer: Self-pay

## 2019-02-12 DIAGNOSIS — I358 Other nonrheumatic aortic valve disorders: Secondary | ICD-10-CM | POA: Diagnosis not present

## 2019-02-12 DIAGNOSIS — I10 Essential (primary) hypertension: Secondary | ICD-10-CM | POA: Diagnosis not present

## 2019-02-12 DIAGNOSIS — E119 Type 2 diabetes mellitus without complications: Secondary | ICD-10-CM | POA: Insufficient documentation

## 2019-02-12 DIAGNOSIS — Z79899 Other long term (current) drug therapy: Secondary | ICD-10-CM | POA: Diagnosis not present

## 2019-02-12 DIAGNOSIS — Z5181 Encounter for therapeutic drug level monitoring: Secondary | ICD-10-CM

## 2019-02-12 NOTE — Progress Notes (Signed)
  Echocardiogram 2D Echocardiogram has been performed.  Sandra Shelton 02/12/2019, 10:42 AM

## 2019-02-16 ENCOUNTER — Encounter: Payer: Self-pay | Admitting: *Deleted

## 2019-02-17 ENCOUNTER — Other Ambulatory Visit: Payer: Self-pay

## 2019-02-17 ENCOUNTER — Inpatient Hospital Stay: Payer: Medicare Other | Attending: Hematology and Oncology

## 2019-02-17 VITALS — BP 142/70 | HR 98 | Temp 98.7°F | Resp 18 | Wt 193.2 lb

## 2019-02-17 DIAGNOSIS — C50411 Malignant neoplasm of upper-outer quadrant of right female breast: Secondary | ICD-10-CM | POA: Insufficient documentation

## 2019-02-17 DIAGNOSIS — Z9221 Personal history of antineoplastic chemotherapy: Secondary | ICD-10-CM | POA: Diagnosis not present

## 2019-02-17 DIAGNOSIS — Z794 Long term (current) use of insulin: Secondary | ICD-10-CM | POA: Diagnosis not present

## 2019-02-17 DIAGNOSIS — Z79811 Long term (current) use of aromatase inhibitors: Secondary | ICD-10-CM | POA: Insufficient documentation

## 2019-02-17 DIAGNOSIS — Z170 Estrogen receptor positive status [ER+]: Secondary | ICD-10-CM | POA: Insufficient documentation

## 2019-02-17 DIAGNOSIS — Z79899 Other long term (current) drug therapy: Secondary | ICD-10-CM | POA: Insufficient documentation

## 2019-02-17 DIAGNOSIS — Z7902 Long term (current) use of antithrombotics/antiplatelets: Secondary | ICD-10-CM | POA: Diagnosis not present

## 2019-02-17 DIAGNOSIS — Z7982 Long term (current) use of aspirin: Secondary | ICD-10-CM | POA: Insufficient documentation

## 2019-02-17 DIAGNOSIS — Z5112 Encounter for antineoplastic immunotherapy: Secondary | ICD-10-CM | POA: Diagnosis not present

## 2019-02-17 DIAGNOSIS — Z923 Personal history of irradiation: Secondary | ICD-10-CM | POA: Diagnosis not present

## 2019-02-17 MED ORDER — ACETAMINOPHEN 325 MG PO TABS
650.0000 mg | ORAL_TABLET | Freq: Once | ORAL | Status: AC
  Administered 2019-02-17: 650 mg via ORAL

## 2019-02-17 MED ORDER — DIPHENHYDRAMINE HCL 25 MG PO CAPS
ORAL_CAPSULE | ORAL | Status: AC
  Filled 2019-02-17: qty 2

## 2019-02-17 MED ORDER — SODIUM CHLORIDE 0.9% FLUSH
10.0000 mL | INTRAVENOUS | Status: DC | PRN
  Administered 2019-02-17: 10 mL
  Filled 2019-02-17: qty 10

## 2019-02-17 MED ORDER — TRASTUZUMAB-DKST CHEMO 150 MG IV SOLR
6.0000 mg/kg | Freq: Once | INTRAVENOUS | Status: AC
  Administered 2019-02-17: 525 mg via INTRAVENOUS
  Filled 2019-02-17: qty 25

## 2019-02-17 MED ORDER — HEPARIN SOD (PORK) LOCK FLUSH 100 UNIT/ML IV SOLN
500.0000 [IU] | Freq: Once | INTRAVENOUS | Status: AC | PRN
  Administered 2019-02-17: 500 [IU]
  Filled 2019-02-17: qty 5

## 2019-02-17 MED ORDER — SODIUM CHLORIDE 0.9 % IV SOLN
Freq: Once | INTRAVENOUS | Status: AC
  Filled 2019-02-17: qty 250

## 2019-02-17 MED ORDER — DIPHENHYDRAMINE HCL 25 MG PO CAPS
50.0000 mg | ORAL_CAPSULE | Freq: Once | ORAL | Status: AC
  Administered 2019-02-17: 50 mg via ORAL

## 2019-02-17 MED ORDER — ACETAMINOPHEN 325 MG PO TABS
ORAL_TABLET | ORAL | Status: AC
  Filled 2019-02-17: qty 2

## 2019-02-17 NOTE — Patient Instructions (Signed)
[facility] Discharge Instructions for Patients Receiving Chemotherapy  Today you received the following chemotherapy agents Trastuzumab.  To help prevent nausea and vomiting after your treatment, we encourage you to take your nausea medication as directed.   If you develop nausea and vomiting that is not controlled by your nausea medication, call the clinic.   BELOW ARE SYMPTOMS THAT SHOULD BE REPORTED IMMEDIATELY:  *FEVER GREATER THAN 100.5 F  *CHILLS WITH OR WITHOUT FEVER  NAUSEA AND VOMITING THAT IS NOT CONTROLLED WITH YOUR NAUSEA MEDICATION  *UNUSUAL SHORTNESS OF BREATH  *UNUSUAL BRUISING OR BLEEDING  TENDERNESS IN MOUTH AND THROAT WITH OR WITHOUT PRESENCE OF ULCERS  *URINARY PROBLEMS  *BOWEL PROBLEMS  UNUSUAL RASH Items with * indicate a potential emergency and should be followed up as soon as possible.  Feel free to call the clinic you have any questions or concerns. The clinic phone number is [phone].  Please show the CHEMO ALERT CARD at check-in to the Emergency Department and triage nurse.    

## 2019-02-25 ENCOUNTER — Other Ambulatory Visit: Payer: Self-pay

## 2019-02-25 ENCOUNTER — Other Ambulatory Visit: Payer: Medicare Other

## 2019-02-25 ENCOUNTER — Ambulatory Visit
Admission: RE | Admit: 2019-02-25 | Discharge: 2019-02-25 | Disposition: A | Discharge status: Home / Self Care | Payer: Medicare Other | Source: Ambulatory Visit | Attending: Adult Health | Admitting: Adult Health

## 2019-02-25 DIAGNOSIS — C50411 Malignant neoplasm of upper-outer quadrant of right female breast: Secondary | ICD-10-CM

## 2019-02-25 DIAGNOSIS — Z853 Personal history of malignant neoplasm of breast: Secondary | ICD-10-CM | POA: Diagnosis not present

## 2019-02-25 DIAGNOSIS — R922 Inconclusive mammogram: Secondary | ICD-10-CM | POA: Diagnosis not present

## 2019-02-25 HISTORY — DX: Personal history of antineoplastic chemotherapy: Z92.21

## 2019-02-25 HISTORY — DX: Personal history of irradiation: Z92.3

## 2019-03-09 NOTE — Progress Notes (Signed)
Patient Care Team: [name], MD as PCP - General (Internal Medicine) Fanny Skates, MD as Consulting Physician (General Surgery) Nicholas Lose, MD as Consulting Physician (Hematology and Oncology) Gery Pray, MD as Consulting Physician (Radiation Oncology) Rockwell Germany, RN as Oncology Nurse Navigator Mauro Kaufmann, RN as Oncology Nurse Navigator  DIAGNOSIS:    ICD-10-CM   1. Malignant neoplasm of upper-outer quadrant of right breast in female, estrogen receptor positive ([place])  C50.411    Z17.0     SUMMARY OF ONCOLOGIC HISTORY: Oncology History  Malignant neoplasm of upper-outer quadrant of right breast in female, estrogen receptor positive ([place])  01/27/2018 Mammogram   Screening Mammogram  Possible right breast mass; asymmetry in left breast.   02/10/2018 Initial Diagnosis   Screening mammogram detected right breast mass and asymmetry in the left breast.  The left breast asymmetry resolved.  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%, HER-2 +3+ by IHC, T1BN0 stage Ia clinical stage   03/24/2018 Surgery   Lumpectomy and SLNB: IDC, 1.2cm, grade 2, margins negative, 4 SLN negative, T1cN0, previously ER/PR/HER-2 positive with Ki-67 of 20%   03/24/2018 Cancer Staging   Staging form: Breast, AJCC 8th Edition - Pathologic stage from 03/24/2018: Stage IA (pT1c, pN0, cM0, G2, ER+, PR+, HER2+) - Signed by Gardenia Phlegm, NP on 03/26/2018   04/22/2018 - 06/24/2018 Chemotherapy   The patient had trastuzumab (HERCEPTIN) 336 mg in sodium chloride 0.9 % 250 mL chemo infusion, 4 mg/kg = 336 mg, Intravenous,  Once, 3 of 16 cycles Administration: 336 mg (04/22/2018), 168 mg (04/29/2018), 168 mg (05/21/2018), 168 mg (05/06/2018), 168 mg (05/13/2018), 168 mg (05/27/2018), 168 mg (06/03/2018), 168 mg (06/10/2018), 168 mg (06/17/2018), 168 mg (06/24/2018) PACLitaxel (TAXOL) 156 mg  in sodium chloride 0.9 % 250 mL chemo infusion (</= 53m/m2), 80 mg/m2 = 156 mg, Intravenous,  Once, 3 of 3 cycles Administration: 156 mg (04/22/2018), 156 mg (04/29/2018), 156 mg (05/21/2018), 156 mg (05/06/2018), 156 mg (05/13/2018), 156 mg (05/27/2018), 156 mg (06/03/2018), 156 mg (06/10/2018), 156 mg (06/17/2018), 156 mg (06/24/2018)  for chemotherapy treatment.    07/01/2018 -  Chemotherapy   The patient had trastuzumab (HERCEPTIN) 525 mg in sodium chloride 0.9 % 250 mL chemo infusion, 6 mg/kg = 525 mg (100 % of original dose 6 mg/kg), Intravenous,  Once, 3 of 3 cycles Dose modification: 6 mg/kg (original dose 6 mg/kg, Cycle 1, Reason: Provider Judgment) Administration: 525 mg (07/01/2018), 525 mg (07/22/2018), 525 mg (08/12/2018) trastuzumab-dkst (OGIVRI) 525 mg in sodium chloride 0.9 % 250 mL chemo infusion, 6 mg/kg = 525 mg (100 % of original dose 6 mg/kg), Intravenous,  Once, 9 of 11 cycles Dose modification: 6 mg/kg (original dose 6 mg/kg, Cycle 4, Reason: Other (see comments), Comment: Biosimilar Conversion) Administration: 525 mg (09/02/2018), 525 mg (09/23/2018), 525 mg (10/14/2018), 525 mg (11/04/2018), 525 mg (11/25/2018), 525 mg (12/16/2018), 525 mg (01/06/2019), 525 mg (01/27/2019), 525 mg (02/17/2019)  for chemotherapy treatment.    07/29/2018 - 08/22/2018 Radiation Therapy   Adjuvant XRT   09/05/2018 -  Anti-estrogen oral therapy   Anastrozole 185mdaily, plan for 5-7 years      CHIEF COMPLIANT: [name]  INTERVAL HISTORY: Sandra Shelton a [age].o. with above-mentioned history of of right breast cancer who underwent a lumpectomy, adjuvant chemotherapy, and radiation.She is currently on [name] and anti-estrogen therapy with anastrozole.Echo  on 02/11/18 showed an ejection fraction of 60-65%. Mammogram on 02/25/19 showed no evidence of malignancy bilaterally. She presents to the clinic todayfor treatment.  ALLERGIES:  has No Known Allergies.  MEDICATIONS:  Current  Outpatient Medications  Medication Sig Dispense Refill  . amLODipine (NORVASC) 10 MG tablet Take 1 tablet (10 mg total) by mouth daily. 90 tablet 2  . anastrozole (ARIMIDEX) 1 MG tablet Take 1 tablet (1 mg total) by mouth daily. 90 tablet 3  . aspirin 81 MG tablet Take 1 tablet (81 mg total) by mouth daily. 100 tablet 2  . blood glucose meter kit and supplies KIT Dispense based on patient and insurance preference. Use up to four times daily as directed. (FOR ICD-9 250.00, 250.01). 1 each 0  . clopidogrel (PLAVIX) 75 MG tablet Take 1 tablet (75 mg total) by mouth daily. 30 tablet 6  . Insulin Pen Needle (TRUEPLUS PEN NEEDLES) 32G X 4 MM MISC Use as directed to inject insulin 100 each 3  . LANTUS SOLOSTAR 100 UNIT/ML Solostar Pen Inject 7 Units into the skin 2 (two) times daily. 15 mL 6  . lidocaine-prilocaine (EMLA) cream APPLY 1 APPLICATION TOPICALLY AS NEEDED. 30 g 0  . lisinopril (ZESTRIL) 5 MG tablet Take 1 tablet (5 mg total) by mouth daily. 90 tablet 6  . metFORMIN (GLUCOPHAGE) 500 MG tablet Take 1 tablet (500 mg total) by mouth 2 (two) times daily with a meal. 180 tablet 3  . rosuvastatin (CRESTOR) 40 MG tablet TAKE 1 TABLET BY MOUTH DAILY AT 6 PM. 30 tablet 6   No current facility-administered medications for this visit.    PHYSICAL EXAMINATION: ECOG PERFORMANCE STATUS: 1 - Symptomatic but completely ambulatory  Vitals:   03/10/19 0931  BP: (!) 152/69  Pulse: 93  Resp: 17  Temp: 98.3 F (36.8 C)  SpO2: 100%   Filed Weights   03/10/19 0931  Weight: 195 lb 11.2 oz (88.8 kg)    LABORATORY DATA:  I have reviewed the data as listed CMP Latest Ref Rng & Units 01/27/2019 12/16/2018 11/04/2018  Glucose 70 - 99 mg/dL 315(H) 262(H) 234(H)  BUN 8 - 23 mg/dL _0 Creatinine 0.44 - 1.00 mg/dL 1.05(H) 1.01(H) 0.90  Sodium 135 - 145 mmol/L 141 140 140  Potassium 3.5 - 5.1 mmol/L 4.2 4.2 4.0  Chloride 98 - 111 mmol/L 107 108 109  CO2 22 - 32 mmol/L 20(L) 23 20(L)  Calcium 8.9 -  10.3 mg/dL 9.4 9.5 10.2  Total Protein 6.5 - 8.1 g/dL 7.5 7.1 7.3  Total Bilirubin 0.3 - 1.2 mg/dL <0.2(L) <0.2(L) <0.2(L)  Alkaline Phos 38 - 126 U/L 59 55 59  AST 15 - 41 U/L _1 ALT 0 - 44 U/L _2 Lab Results  Component Value Date   WBC 6.4 03/10/2019   HGB 12.2 03/10/2019   HCT 37.7 03/10/2019   MCV 85.3 03/10/2019   PLT 278 03/10/2019   NEUTROABS 4.3 03/10/2019    ASSESSMENT & PLAN:  Malignant neoplasm of upper-outer quadrant of right breast in female, estrogen receptor positive ([place]) 02/04/2018:Screening mammogram detected right breast mass and asymmetry in the left breast. The left breast asymmetry resolved. 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%, HER-2 +3+ by IHC, T1BN0 stage Ia clinical stage  03/24/2018: Rt BCS: Grade 2 IDC 1.2 cm ER 100%,  PR 70%, Ki-67 20%, HER-2 +3+ by IHC, T1BN0 stage Ia Left BCS: DCIS  Treatment plan: 1. Adj chemo with Taxol Herceptin weekly X 11 (stopped for peripheral neuropathy)started 04/22/2018-07/01/2018,now on [name] to complete 1 full year of therapy 2. Adjuvant radiation therapy completed 08/22/2018 3. Adjuvant antiestrogen therapy started 09/05/2018 ------------------------------------------------------------------------------------------------------------------------ Current treatment:[name] therapy every 3 weeks, anastrozole 1 mg daily started 09/05/2018 Echo:10/27/2020shows an EF of 60-65%, new echocardiogram will be requested Will complete [name] March 2021  Chemo-induced peripheral neuropathy: Monitoring closely, stable to slightly improved Anastrozole toxicities: Denies any hot flashes or myalgias.  She will need a bone density test every 2 years.  Diabetes: I discussed with her about the importance of bringing her blood sugars down with diet exercise and  her medications. We talked about intermittent fasting as another method to decrease the weight.  Breast discomfort: Related to prior surgery and radiation.  We discussed referral to physical therapy. She will complete Herceptin and 3 weeks on March 31, 2019. We will send a message to surgeons to remove her port.  She is to see Dr. Dalbert Batman.  We will find a different surgeon since he is retired.  I will see the patient back in 6 months for follow-up on anastrozole therapy.  No orders of the defined types were placed in this encounter.  The patient has a good understanding of the overall plan. she agrees with it. she will call with any problems that may develop before the next visit here.  Total time spent: 30 mins including face to face time and time spent for planning, charting and coordination of care  Nicholas Lose, MD 03/10/2019  I, Cloyde Reams Dorshimer, am acting as scribe for Dr. Nicholas Lose.  I have reviewed the above documentation for accuracy and completeness, and I agree with the above.

## 2019-03-10 ENCOUNTER — Inpatient Hospital Stay: Payer: Medicare Other

## 2019-03-10 ENCOUNTER — Inpatient Hospital Stay (HOSPITAL_BASED_OUTPATIENT_CLINIC_OR_DEPARTMENT_OTHER): Payer: Medicare Other | Admitting: Hematology and Oncology

## 2019-03-10 ENCOUNTER — Other Ambulatory Visit: Payer: Self-pay

## 2019-03-10 DIAGNOSIS — Z5112 Encounter for antineoplastic immunotherapy: Secondary | ICD-10-CM | POA: Diagnosis not present

## 2019-03-10 DIAGNOSIS — Z9221 Personal history of antineoplastic chemotherapy: Secondary | ICD-10-CM | POA: Diagnosis not present

## 2019-03-10 DIAGNOSIS — Z170 Estrogen receptor positive status [ER+]: Secondary | ICD-10-CM

## 2019-03-10 DIAGNOSIS — Z79811 Long term (current) use of aromatase inhibitors: Secondary | ICD-10-CM | POA: Diagnosis not present

## 2019-03-10 DIAGNOSIS — C50111 Malignant neoplasm of central portion of right female breast: Secondary | ICD-10-CM

## 2019-03-10 DIAGNOSIS — C50411 Malignant neoplasm of upper-outer quadrant of right female breast: Secondary | ICD-10-CM

## 2019-03-10 DIAGNOSIS — Z95828 Presence of other vascular implants and grafts: Secondary | ICD-10-CM

## 2019-03-10 DIAGNOSIS — Z923 Personal history of irradiation: Secondary | ICD-10-CM | POA: Diagnosis not present

## 2019-03-10 LAB — CMP (CANCER CENTER ONLY)
ALT: 21 U/L (ref 0–44)
AST: 13 U/L — ABNORMAL LOW (ref 15–41)
Albumin: 3.9 g/dL (ref 3.5–5.0)
Alkaline Phosphatase: 58 U/L (ref 38–126)
Anion gap: 11 (ref 5–15)
BUN: 16 mg/dL (ref 8–23)
CO2: 22 mmol/L (ref 22–32)
Calcium: 9.6 mg/dL (ref 8.9–10.3)
Chloride: 104 mmol/L (ref 98–111)
Creatinine: 1.04 mg/dL — ABNORMAL HIGH (ref 0.44–1.00)
GFR, Est AFR Am: >60 mL/min (ref >60)
GFR, Estimated: 56 mL/min — ABNORMAL LOW (ref >60)
Glucose, Bld: 347 mg/dL — ABNORMAL HIGH (ref 70–99)
Potassium: 4 mmol/L (ref 3.5–5.1)
Sodium: 137 mmol/L (ref 135–145)
Total Bilirubin: <0.2 mg/dL — ABNORMAL LOW (ref 0.3–1.2)
Total Protein: 7.6 g/dL (ref 6.5–8.1)

## 2019-03-10 LAB — CBC WITH DIFFERENTIAL (CANCER CENTER ONLY)
Abs Immature Granulocytes: 0.02 10*3/uL (ref 0.00–0.07)
Basophils Absolute: 0 10*3/uL (ref 0.0–0.1)
Basophils Relative: 1 %
Eosinophils Absolute: 0.1 10*3/uL (ref 0.0–0.5)
Eosinophils Relative: 1 %
HCT: 37.7 % (ref 36.0–46.0)
Hemoglobin: 12.2 g/dL (ref 12.0–15.0)
Immature Granulocytes: 0 %
Lymphocytes Relative: 23 %
Lymphs Abs: 1.5 10*3/uL (ref 0.7–4.0)
MCH: 27.6 pg (ref 26.0–34.0)
MCHC: 32.4 g/dL (ref 30.0–36.0)
MCV: 85.3 fL (ref 80.0–100.0)
Monocytes Absolute: 0.4 10*3/uL (ref 0.1–1.0)
Monocytes Relative: 7 %
Neutro Abs: 4.3 10*3/uL (ref 1.7–7.7)
Neutrophils Relative %: 68 %
Platelet Count: 278 10*3/uL (ref 150–400)
RBC: 4.42 MIL/uL (ref 3.87–5.11)
RDW: 12.9 % (ref 11.5–15.5)
WBC Count: 6.4 10*3/uL (ref 4.0–10.5)
nRBC: 0 % (ref 0.0–0.2)

## 2019-03-10 MED ORDER — TRASTUZUMAB-DKST CHEMO 150 MG IV SOLR
6.0000 mg/kg | Freq: Once | INTRAVENOUS | Status: AC
  Administered 2019-03-10: 11:00:00 525 mg via INTRAVENOUS
  Filled 2019-03-10: qty 25

## 2019-03-10 MED ORDER — ACETAMINOPHEN 325 MG PO TABS
650.0000 mg | ORAL_TABLET | Freq: Once | ORAL | Status: AC
  Administered 2019-03-10: 10:00:00 650 mg via ORAL

## 2019-03-10 MED ORDER — SODIUM CHLORIDE 0.9% FLUSH
10.0000 mL | INTRAVENOUS | Status: DC | PRN
  Administered 2019-03-10: 12:00:00 10 mL
  Filled 2019-03-10: qty 10

## 2019-03-10 MED ORDER — HEPARIN SOD (PORK) LOCK FLUSH 100 UNIT/ML IV SOLN
500.0000 [IU] | Freq: Once | INTRAVENOUS | Status: AC | PRN
  Administered 2019-03-10: 500 [IU]
  Filled 2019-03-10: qty 5

## 2019-03-10 MED ORDER — SODIUM CHLORIDE 0.9 % IV SOLN
Freq: Once | INTRAVENOUS | Status: AC
  Filled 2019-03-10: qty 250

## 2019-03-10 MED ORDER — SODIUM CHLORIDE 0.9% FLUSH
10.0000 mL | Freq: Once | INTRAVENOUS | Status: AC
  Administered 2019-03-10: 10 mL
  Filled 2019-03-10: qty 10

## 2019-03-10 MED ORDER — ACETAMINOPHEN 325 MG PO TABS
ORAL_TABLET | ORAL | Status: AC
  Filled 2019-03-10: qty 2

## 2019-03-10 MED ORDER — DIPHENHYDRAMINE HCL 25 MG PO CAPS
50.0000 mg | ORAL_CAPSULE | Freq: Once | ORAL | Status: AC
  Administered 2019-03-10: 10:00:00 50 mg via ORAL

## 2019-03-10 MED ORDER — DIPHENHYDRAMINE HCL 25 MG PO CAPS
ORAL_CAPSULE | ORAL | Status: AC
  Filled 2019-03-10: qty 2

## 2019-03-10 NOTE — Assessment & Plan Note (Signed)
02/04/2018:Screening mammogram detected right breast mass and asymmetry in the left breast. The left breast asymmetry resolved. 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 Adj chemo with Taxol Herceptin weekly X 11 (stopped for peripheral neuropathy)started 04/22/2018-07/01/2018,now on [name] to complete 1 full year of therapy 2. Adjuvant radiation therapy completed 08/22/2018 3. Adjuvant antiestrogen therapy started 09/05/2018 ------------------------------------------------------------------------------------------------------------------------ Current treatment:[name] therapy every 3 weeks, anastrozole 1 mg daily started 09/05/2018 Echo:10/27/2020shows an EF of 60-65%, new echocardiogram will be requested Will complete [name] March 2021  Chemo-induced peripheral neuropathy: Monitoring closely, stable to slightly improved Anastrozole toxicities: Denies any hot flashes or myalgias.  She will need a bone density test every 2 years.  Diabetes: I discussed with her about the importance of bringing her blood sugars down with diet exercise and her medications. We talked about intermittent fasting as another method to decrease the weight.  Breast discomfort: Related to prior surgery and radiation.  We discussed referral to physical therapy. Return to clinic every 3 weeks for Herceptin every 6 weeks for follow-up with me

## 2019-03-10 NOTE — Patient Instructions (Signed)
[facility] Discharge Instructions for Patients Receiving Chemotherapy  Today you received the following chemotherapy agents trastuzumab.  To help prevent nausea and vomiting after your treatment, we encourage you to take your nausea medication as directed.    If you develop nausea and vomiting that is not controlled by your nausea medication, call the clinic.   BELOW ARE SYMPTOMS THAT SHOULD BE REPORTED IMMEDIATELY:  *FEVER GREATER THAN 100.5 F  *CHILLS WITH OR WITHOUT FEVER  NAUSEA AND VOMITING THAT IS NOT CONTROLLED WITH YOUR NAUSEA MEDICATION  *UNUSUAL SHORTNESS OF BREATH  *UNUSUAL BRUISING OR BLEEDING  TENDERNESS IN MOUTH AND THROAT WITH OR WITHOUT PRESENCE OF ULCERS  *URINARY PROBLEMS  *BOWEL PROBLEMS  UNUSUAL RASH Items with * indicate a potential emergency and should be followed up as soon as possible.  Feel free to call the clinic should you have any questions or concerns. The clinic phone number is [phone].  Please show the CHEMO ALERT CARD at check-in to the Emergency Department and triage nurse.   

## 2019-03-11 ENCOUNTER — Telehealth: Payer: Self-pay | Admitting: Hematology and Oncology

## 2019-03-11 NOTE — Telephone Encounter (Signed)
I talk with patient regarding schedule  

## 2019-03-18 ENCOUNTER — Other Ambulatory Visit: Payer: Self-pay | Admitting: Internal Medicine

## 2019-03-20 ENCOUNTER — Other Ambulatory Visit: Payer: Self-pay | Admitting: Internal Medicine

## 2019-03-20 MED FILL — ROSUVASTATIN CALCIUM 40 MG: 40 | 30 days supply | Qty: 30 | Fill #0

## 2019-03-31 ENCOUNTER — Inpatient Hospital Stay: Payer: Medicare Other | Attending: Hematology and Oncology

## 2019-03-31 ENCOUNTER — Other Ambulatory Visit: Payer: Self-pay

## 2019-03-31 ENCOUNTER — Encounter: Payer: Self-pay | Admitting: *Deleted

## 2019-03-31 ENCOUNTER — Other Ambulatory Visit: Payer: Self-pay | Admitting: Internal Medicine

## 2019-03-31 VITALS — BP 146/70 | HR 79 | Temp 98.3°F | Resp 17 | Wt 195.5 lb

## 2019-03-31 DIAGNOSIS — Z8673 Personal history of transient ischemic attack (TIA), and cerebral infarction without residual deficits: Secondary | ICD-10-CM

## 2019-03-31 DIAGNOSIS — C50411 Malignant neoplasm of upper-outer quadrant of right female breast: Secondary | ICD-10-CM

## 2019-03-31 DIAGNOSIS — Z5112 Encounter for antineoplastic immunotherapy: Secondary | ICD-10-CM | POA: Insufficient documentation

## 2019-03-31 DIAGNOSIS — Z170 Estrogen receptor positive status [ER+]: Secondary | ICD-10-CM | POA: Insufficient documentation

## 2019-03-31 DIAGNOSIS — I779 Disorder of arteries and arterioles, unspecified: Secondary | ICD-10-CM

## 2019-03-31 MED ORDER — ACETAMINOPHEN 325 MG PO TABS
650.0000 mg | ORAL_TABLET | Freq: Once | ORAL | Status: AC
  Administered 2019-03-31: 650 mg via ORAL

## 2019-03-31 MED ORDER — DIPHENHYDRAMINE HCL 25 MG PO CAPS
50.0000 mg | ORAL_CAPSULE | Freq: Once | ORAL | Status: AC
  Administered 2019-03-31: 09:00:00 50 mg via ORAL

## 2019-03-31 MED ORDER — DIPHENHYDRAMINE HCL 25 MG PO CAPS
ORAL_CAPSULE | ORAL | Status: AC
  Filled 2019-03-31: qty 2

## 2019-03-31 MED ORDER — HEPARIN SOD (PORK) LOCK FLUSH 100 UNIT/ML IV SOLN
500.0000 [IU] | Freq: Once | INTRAVENOUS | Status: AC | PRN
  Administered 2019-03-31: 500 [IU]
  Filled 2019-03-31: qty 5

## 2019-03-31 MED ORDER — TRASTUZUMAB-DKST CHEMO 150 MG IV SOLR
6.0000 mg/kg | Freq: Once | INTRAVENOUS | Status: AC
  Administered 2019-03-31: 525 mg via INTRAVENOUS
  Filled 2019-03-31: qty 25

## 2019-03-31 MED ORDER — SODIUM CHLORIDE 0.9% FLUSH
10.0000 mL | INTRAVENOUS | Status: DC | PRN
  Administered 2019-03-31: 11:00:00 10 mL
  Filled 2019-03-31: qty 10

## 2019-03-31 MED ORDER — ACETAMINOPHEN 325 MG PO TABS
ORAL_TABLET | ORAL | Status: AC
  Filled 2019-03-31: qty 2

## 2019-03-31 MED ORDER — SODIUM CHLORIDE 0.9 % IV SOLN
Freq: Once | INTRAVENOUS | Status: AC
  Filled 2019-03-31: qty 250

## 2019-03-31 MED FILL — AMLODIPINE BESYLATE 10 MG T: 10 | 90 days supply | Qty: 90 | Fill #2

## 2019-03-31 MED FILL — metFORMIN HCL 500 MG TABS: 500 | 90 days supply | Qty: 180 | Fill #2

## 2019-03-31 MED FILL — LANTUS SOLOSTAR 100 UNITS/M: 100 | 104 days supply | Qty: 15 | Fill #4

## 2019-03-31 NOTE — Patient Instructions (Signed)
[facility] °Discharge Instructions for Patients Receiving Chemotherapy ° °Today you received the following chemotherapy agents Trastuzumab ° °To help prevent nausea and vomiting after your treatment, we encourage you to take your nausea medication as directed. °  °If you develop nausea and vomiting that is not controlled by your nausea medication, call the clinic.  ° °BELOW ARE SYMPTOMS THAT SHOULD BE REPORTED IMMEDIATELY: °· *FEVER GREATER THAN 100.5 F °· *CHILLS WITH OR WITHOUT FEVER °· NAUSEA AND VOMITING THAT IS NOT CONTROLLED WITH YOUR NAUSEA MEDICATION °· *UNUSUAL SHORTNESS OF BREATH °· *UNUSUAL BRUISING OR BLEEDING °· TENDERNESS IN MOUTH AND THROAT WITH OR WITHOUT PRESENCE OF ULCERS °· *URINARY PROBLEMS °· *BOWEL PROBLEMS °· UNUSUAL RASH °Items with * indicate a potential emergency and should be followed up as soon as possible. ° °Feel free to call the clinic should you have any questions or concerns. The clinic phone number is [phone]. ° °Please show the CHEMO ALERT CARD at check-in to the Emergency Department and triage nurse. ° ° °

## 2019-04-07 ENCOUNTER — Telehealth: Payer: Self-pay

## 2019-04-07 NOTE — Telephone Encounter (Signed)
Called pt to see if they were utilizing a CPAP machine. (please see previous notes and visits) Also wanted to check and to see if there was anything wrong with the machine or if she needed a different mask. Pt wanted her appointment to be canceled.

## 2019-04-08 ENCOUNTER — Ambulatory Visit: Payer: Medicare Other | Admitting: Adult Health

## 2019-04-21 ENCOUNTER — Ambulatory Visit: Payer: Medicare Other | Attending: Internal Medicine | Admitting: Internal Medicine

## 2019-04-21 ENCOUNTER — Other Ambulatory Visit: Payer: Self-pay

## 2019-04-21 ENCOUNTER — Other Ambulatory Visit: Payer: Self-pay | Admitting: Internal Medicine

## 2019-04-21 ENCOUNTER — Encounter: Payer: Self-pay | Admitting: Internal Medicine

## 2019-04-21 VITALS — BP 138/60 | HR 92 | Temp 97.5°F | Resp 16 | Wt 194.8 lb

## 2019-04-21 DIAGNOSIS — Z794 Long term (current) use of insulin: Secondary | ICD-10-CM | POA: Diagnosis not present

## 2019-04-21 DIAGNOSIS — I6522 Occlusion and stenosis of left carotid artery: Secondary | ICD-10-CM | POA: Diagnosis not present

## 2019-04-21 DIAGNOSIS — Z6830 Body mass index (BMI) 30.0-30.9, adult: Secondary | ICD-10-CM | POA: Diagnosis not present

## 2019-04-21 DIAGNOSIS — G620 Drug-induced polyneuropathy: Secondary | ICD-10-CM | POA: Diagnosis not present

## 2019-04-21 DIAGNOSIS — E1159 Type 2 diabetes mellitus with other circulatory complications: Secondary | ICD-10-CM | POA: Insufficient documentation

## 2019-04-21 DIAGNOSIS — Z8673 Personal history of transient ischemic attack (TIA), and cerebral infarction without residual deficits: Secondary | ICD-10-CM | POA: Diagnosis not present

## 2019-04-21 DIAGNOSIS — Z7902 Long term (current) use of antithrombotics/antiplatelets: Secondary | ICD-10-CM | POA: Diagnosis not present

## 2019-04-21 DIAGNOSIS — I779 Disorder of arteries and arterioles, unspecified: Secondary | ICD-10-CM | POA: Diagnosis not present

## 2019-04-21 DIAGNOSIS — E1142 Type 2 diabetes mellitus with diabetic polyneuropathy: Secondary | ICD-10-CM | POA: Diagnosis not present

## 2019-04-21 DIAGNOSIS — I10 Essential (primary) hypertension: Secondary | ICD-10-CM | POA: Diagnosis not present

## 2019-04-21 DIAGNOSIS — Z1211 Encounter for screening for malignant neoplasm of colon: Secondary | ICD-10-CM | POA: Insufficient documentation

## 2019-04-21 DIAGNOSIS — Z79899 Other long term (current) drug therapy: Secondary | ICD-10-CM | POA: Diagnosis not present

## 2019-04-21 DIAGNOSIS — T451X5A Adverse effect of antineoplastic and immunosuppressive drugs, initial encounter: Secondary | ICD-10-CM | POA: Insufficient documentation

## 2019-04-21 DIAGNOSIS — E669 Obesity, unspecified: Secondary | ICD-10-CM | POA: Insufficient documentation

## 2019-04-21 DIAGNOSIS — Z833 Family history of diabetes mellitus: Secondary | ICD-10-CM | POA: Diagnosis not present

## 2019-04-21 DIAGNOSIS — Z7982 Long term (current) use of aspirin: Secondary | ICD-10-CM | POA: Insufficient documentation

## 2019-04-21 LAB — POCT GLYCOSYLATED HEMOGLOBIN (HGB A1C): HbA1c, POC (controlled diabetic range): 7.9 % — AB (ref 0.0–7.0)

## 2019-04-21 LAB — GLUCOSE, POCT (MANUAL RESULT ENTRY): POC Glucose: 161 mg/dl — AB (ref 70–99)

## 2019-04-21 MED ORDER — METFORMIN HCL 500 MG PO TABS: ORAL_TABLET | ORAL | 3 refills | Status: DC

## 2019-04-21 MED ORDER — LISINOPRIL 10 MG PO TABS: 10.0000 mg | ORAL_TABLET | Freq: Every day | ORAL | 3 refills | Status: DC

## 2019-04-21 MED FILL — LISINOPRIL 10 MG TABS: 10 | 90 days supply | Qty: 90 | Fill #0

## 2019-04-21 NOTE — Patient Instructions (Signed)
Increase Metformin to 500 mg 2 tablets in the mornings and 1 tablet in the evenings.  Your blood pressure is not at goal.  We have increase lisinopril to 10 mg daily.

## 2019-04-21 NOTE — Progress Notes (Signed)
Patient ID: Sandra Shelton, female    DOB: 1955-07-28  MRN: [id number]  CC: Diabetes and Hypertension   Subjective: Sandra Shelton is a 64 y.o. female who presents for chronic ds management. 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  Breast CA:  Completed chemo 03/31/2019.  Developed some neuropathy in hands and feet.  Hands>feet.  Burning in extremities.   On Arimidex. Has bone density coming up. Port will be removed next wk.  History of left CAS s/p TCAR: Saw Dr. Nona Dell nurse practitioner 08/2018 in f/u.  Carotid Doppler done on that visit showed no stenosis of the left CCA and ICA.  Plan was to continue on aspirin, Plavix and statin indefinitely.  Patient has no residual neurologic deficits and no subsequent neurologic events.  DIABETES TYPE 2 Last A1C:   Results for orders placed or performed in visit on 04/21/19  POCT glucose (manual entry)  Result Value Ref Range   POC Glucose 161 (A) 70 - 99 mg/dl  POCT glycosylated hemoglobin (Hb A1C)  Result Value Ref Range   Hemoglobin A1C     HbA1c POC (<> result, manual entry)     HbA1c, POC (prediabetic range)     HbA1c, POC (controlled diabetic range) 7.9 (A) 0.0 - 7.0 %    Med Adherence:  [x]  Yes-confirms compliance with Metformin and Lantus insulin 7 units twice a day Medication side effects:  []  Yes    [x]  No Home Monitoring?  [x]  Yes BID before BF and DN.  Did not bring log   Home glucose results range: before BF 120-165 and before DN 97-140s Diet Adherence: [x]  Yes but discouraged with wgh gain.  Gained 12 lbs since last visit in 08/2019 Exercise: [x]  Yes walking daily.  Does not time self Hypoglycemic episodes?: []  Yes    [x]  No Numbness of the feet? [x]  Yes    []  No Retinopathy hx? []  Yes    [x]  No Last eye exam: 07/2018 Comments:   HYPERTENSION/Hx  CVA Currently taking: see medication list.  She is on amlodipine 10 mg and lisinopril 5 mg daily.  Took meds already for the morning Med Adherence: [x]  Yes    []  No Medication side effects: []  Yes    [x]  No Adherence with salt restriction: [x]  Yes    []  No Home Monitoring?: []  Yes    [x]  No Monitoring Frequency: []  Yes    []  No Home BP results range: []  Yes    []  No SOB? []  Yes    [x]  No Chest Pain?: []  Yes    [x]  No Leg swelling?: []  Yes    [x]  No Headaches?: []  Yes    [x]  No Dizziness? []  Yes    [x]  No Comments:   HM:  Had Moderna vaccine #1 03/25/19.  2nd one to be give tomorrow.  Due for Prevnar 13 and colon CA screen.  Father with colon CA in his late 31s-early 60s.  Pt had c-scope 20 yrs.   Patient Active Problem List   Diagnosis Date Noted  . Obesity (BMI 30-39.9) 04/21/2019  . Port-A-Cath in place 05/06/2018  . Malignant neoplasm of upper-outer quadrant of right breast in female, estrogen receptor positive ([place]) 02/10/2018  . Malignant neoplasm of central portion of right female breast ([place]) 02/06/2018  . Hypercalcemia 12/09/2017  . Diabetes mellitus type II, controlled ([place]) 09/02/2017  .  Essential hypertension 09/02/2017  . Left-sided carotid artery disease ([place]) 09/02/2017  . Pre-ulcerative corn or callous 09/02/2017  . Embolic stroke involving left carotid artery ([place]) 07/24/2017  . Cerebral thrombosis with cerebral infarction 07/08/2017     Current Outpatient Medications on File Prior to Visit  Medication Sig Dispense Refill  . amLODipine (NORVASC) 10 MG tablet Take 1 tablet (10 mg total) by mouth daily. 90 tablet 2  . anastrozole (ARIMIDEX) 1 MG tablet Take 1 tablet (1 mg total) by mouth daily. 90 tablet 3  . aspirin 81 MG tablet Take 1 tablet (81 mg total) by mouth daily. 100 tablet 2  . blood glucose meter kit and supplies KIT Dispense based on patient and insurance preference. Use up to four times daily as directed. (FOR ICD-9 250.00, 250.01). 1 each 0  .  clopidogrel (PLAVIX) 75 MG tablet Take 1 tablet (75 mg total) by mouth daily. 30 tablet 6  . Insulin Pen Needle (TRUEPLUS PEN NEEDLES) 32G X 4 MM MISC Use as directed to inject insulin 100 each 3  . LANTUS SOLOSTAR 100 UNIT/ML Solostar Pen Inject 7 Units into the skin 2 (two) times daily. 15 mL 6  . lidocaine-prilocaine (EMLA) cream APPLY 1 APPLICATION TOPICALLY AS NEEDED. 30 g 0  . rosuvastatin (CRESTOR) 40 MG tablet Take 1 tablet (40 mg total) by mouth daily. Must have office visit for refills 30 tablet 0   No current facility-administered medications on file prior to visit.    No Known Allergies  Social History   Socioeconomic History  . Marital status: Single    Spouse name: Not on file  . Number of children: Not on file  . Years of education: Not on file  . Highest education level: Not on file  Occupational History  . Not on file  Tobacco Use  . Smoking status: Never Smoker  . Smokeless tobacco: Never Used  Substance and Sexual Activity  . Alcohol use: No  . Drug use: No  . Sexual activity: Not on file  Other Topics Concern  . Not on file  Social History Narrative   Lives home alone.  Single.  Education Some college.  No children.     Social Determinants of Health   Financial Resource Strain:   . Difficulty of Paying Living Expenses:   Food Insecurity:   . Worried About Charity fundraiser in the Last Year:   . Arboriculturist in the Last Year:   Transportation Needs:   . Film/video editor (Medical):   Marland Kitchen Lack of Transportation (Non-Medical):   Physical Activity:   . Days of Exercise per Week:   . Minutes of Exercise per Session:   Stress:   . Feeling of Stress :   Social Connections:   . Frequency of Communication with Friends and Family:   . Frequency of Social Gatherings with Friends and Family:   . Attends Religious Services:   . Active Member of Clubs or Organizations:   . Attends Archivist Meetings:   Marland Kitchen Marital Status:   Intimate  Partner Violence:   . Fear of Current or Ex-Partner:   . Emotionally Abused:   Marland Kitchen Physically Abused:   . Sexually Abused:     Family History  Problem Relation Age of Onset  . Diabetes Mellitus II Sister   . Stroke Mother   . Diabetes Mellitus II Mother   . Cancer Father   . Diabetes Mellitus II Brother   . Breast  cancer Maternal Aunt     Past Surgical History:  Procedure Laterality Date  . BREAST EXCISIONAL BIOPSY Right   . BREAST LUMPECTOMY Right 03/2018  . BREAST LUMPECTOMY WITH RADIOACTIVE SEED AND SENTINEL LYMPH NODE BIOPSY Right 03/24/2018   Procedure: RIGHT BREAST LUMPECTOMY WITH RADIOACTIVE SEED AND RIGHT AXILLARY DEEP SENTINEL LYMPH NODE BIOPSY INJECT BLUE DYE RIGHT BREAST;  Surgeon: Fanny Skates, MD;  Location: [place];  Service: General;  Laterality: Right;  . COLONOSCOPY    . PORTACATH PLACEMENT N/A 04/09/2018   Procedure: INSERTION PORT-A-CATH WITH ULTRASOUND;  Surgeon: Fanny Skates, MD;  Location: [place];  Service: General;  Laterality: N/A;  . TRANSCAROTID ARTERY REVASCULARIZATION Left 07/24/2017   Procedure: TRANSCAROTID ARTERY REVASCULARIZATION, left;  Surgeon: Elam Dutch, MD;  Location: [facility];  Service: Vascular;  Laterality: Left;  . UTERINE FIBROID SURGERY     2005    ROS: Review of Systems Negative except as stated above  PHYSICAL EXAM: BP 138/60   Pulse 92   Temp (!) 97.5 F (36.4 C)   Resp 16   Wt 194 lb 12.8 oz (88.4 kg)   SpO2 99%   BMI 32.42 kg/m   Wt Readings from Last 3 Encounters:  04/21/19 194 lb 12.8 oz (88.4 kg)  03/31/19 195 lb 8 oz (88.7 kg)  03/10/19 195 lb 11.2 oz (88.8 kg)    Physical Exam  General appearance - alert, well appearing, older African-American female and in no distress Mental status - normal mood, behavior, speech, dress, motor activity, and thought processes Neck - supple, no significant adenopathy.  No carotid bruits. Chest - clear to auscultation, no wheezes,  rales or rhonchi, symmetric air entry Heart - normal rate, regular rhythm, normal S1, S2, no murmurs, rubs, clicks or gallops Extremities - peripheral pulses normal, no pedal edema, no clubbing or cyanosis Diabetic Foot Exam - Simple   Simple Foot Form Visual Inspection See comments: Yes Sensation Testing Intact to touch and monofilament testing bilaterally: Yes Pulse Check Posterior Tibialis and Dorsalis pulse intact bilaterally: Yes Comments She has some hyperpigmentation of the tips of the toenails on both feet.      CMP Latest Ref Rng & Units 03/10/2019 01/27/2019 12/16/2018  Glucose 70 - 99 mg/dL 347(H) 315(H) 262(H)  BUN 8 - 23 mg/dL 16 18 16   Creatinine 0.44 - 1.00 mg/dL 1.04(H) 1.05(H) 1.01(H)  Sodium 135 - 145 mmol/L 137 141 140  Potassium 3.5 - 5.1 mmol/L 4.0 4.2 4.2  Chloride 98 - 111 mmol/L 104 107 108  CO2 22 - 32 mmol/L 22 20(L) 23  Calcium 8.9 - 10.3 mg/dL 9.6 9.4 9.5  Total Protein 6.5 - 8.1 g/dL 7.6 7.5 7.1  Total Bilirubin 0.3 - 1.2 mg/dL <0.2(L) <0.2(L) <0.2(L)  Alkaline Phos 38 - 126 U/L 58 59 55  AST 15 - 41 U/L 13(L) 15 18  ALT 0 - 44 U/L 21 22 23    Lipid Panel     Component Value Date/Time   CHOL 147 09/02/2017 1043   TRIG 105 09/02/2017 1043   HDL 48 09/02/2017 1043   CHOLHDL 3.1 09/02/2017 1043   CHOLHDL 8.2 07/09/2017 0646   VLDL 76 (H) 07/09/2017 0646   LDLCALC 78 09/02/2017 1043    CBC    Component Value Date/Time   WBC 6.4 03/10/2019 0845   WBC 7.3 03/20/2018 1207   RBC 4.42 03/10/2019 0845   HGB 12.2 03/10/2019 0845   HGB 13.0 09/02/2017 1043  HCT 37.7 03/10/2019 0845   HCT 39.2 09/02/2017 1043   PLT 278 03/10/2019 0845   PLT 394 09/02/2017 1043   MCV 85.3 03/10/2019 0845   MCV 84 09/02/2017 1043   MCH 27.6 03/10/2019 0845   MCHC 32.4 03/10/2019 0845   RDW 12.9 03/10/2019 0845   RDW 13.4 09/02/2017 1043   LYMPHSABS 1.5 03/10/2019 0845   MONOABS 0.4 03/10/2019 0845   EOSABS 0.1 03/10/2019 0845   BASOSABS 0.0 03/10/2019 0845     ASSESSMENT AND PLAN: 1. Type 2 diabetes mellitus with other circulatory complication, with long-term current use of insulin (HCC) Not at goal.  Increase Metformin to 1000 mg in the morning and 500 at night. Encourage her to continue healthy eating habits.  Continue regular exercise.  She will try to increase her exercise tolerance up to 30 minutes a day. - POCT glucose (manual entry) - POCT glycosylated hemoglobin (Hb A1C) - metFORMIN (GLUCOPHAGE) 500 MG tablet; 2 tabs in morning and one in the evening PO  Dispense: 270 tablet; Refill: 3 - Lipid panel - Microalbumin / creatinine urine ratio  2. Essential hypertension Not at goal.  Increase lisinopril to 10 mg daily.  Continue Norvasc. - lisinopril (ZESTRIL) 10 MG tablet; Take 1 tablet (10 mg total) by mouth daily.  Dispense: 90 tablet; Refill: 3  3. Obesity (BMI 30-39.9) See #1 above  4. Left-sided carotid artery disease, unspecified type ([place]) Saw vascular 08/2018.  Plan is to continue Plavix, aspirin and statin indefinitely.  She has a follow-up with them again August of this year.  5. History of CVA (cerebrovascular accident) Clinically stable without residual deficits.  Stressed importance of good diabetes and blood pressure control.  6. Peripheral neuropathy due to chemotherapy [facility]) -Patient reports this is a little bit better since she has completed chemotherapy.  7. Screening for colon cancer Patient with family history of colon cancer in first-degree relative.  She is overdue for screening.  She is agreeable to referral to the gastroenterologist. - Ambulatory referral to Gastroenterology  We will hold off on giving the Prevnar 13 vaccine until next visit since she is in the process of completing vaccination for the COVID-19 virus.   Patient was given the opportunity to ask questions.  Patient verbalized understanding of the plan and was able to repeat key elements of the plan.   Orders Placed This Encounter   Procedures  . Lipid panel  . Microalbumin / creatinine urine ratio  . Ambulatory referral to Gastroenterology  . POCT glucose (manual entry)  . POCT glycosylated hemoglobin (Hb A1C)     Requested Prescriptions   Signed Prescriptions Disp Refills  . metFORMIN (GLUCOPHAGE) 500 MG tablet 270 tablet 3    Sig: 2 tabs in morning and one in the evening PO  . lisinopril (ZESTRIL) 10 MG tablet 90 tablet 3    Sig: Take 1 tablet (10 mg total) by mouth daily.    Return in about 4 months (around 08/21/2019).  [name], MD, FACP

## 2019-04-22 ENCOUNTER — Encounter: Payer: Self-pay | Admitting: Gastroenterology

## 2019-04-22 LAB — MICROALBUMIN / CREATININE URINE RATIO
Creatinine, Urine: 130.9 mg/dL
Microalb/Creat Ratio: 23 mg/g creat (ref 0–29)
Microalbumin, Urine: 30.4 ug/mL

## 2019-04-22 LAB — LIPID PANEL
Chol/HDL Ratio: 3.2 ratio (ref 0.0–4.4)
Cholesterol, Total: 146 mg/dL (ref 100–199)
HDL: 46 mg/dL (ref >39)
LDL Chol Calc (NIH): 70 mg/dL (ref 0–99)
Triglycerides: 181 mg/dL — ABNORMAL HIGH (ref 0–149)
VLDL Cholesterol Cal: 30 mg/dL (ref 5–40)

## 2019-04-23 ENCOUNTER — Other Ambulatory Visit: Payer: Self-pay | Admitting: Internal Medicine

## 2019-04-23 DIAGNOSIS — Z8673 Personal history of transient ischemic attack (TIA), and cerebral infarction without residual deficits: Secondary | ICD-10-CM

## 2019-04-23 DIAGNOSIS — I779 Disorder of arteries and arterioles, unspecified: Secondary | ICD-10-CM

## 2019-04-23 MED FILL — ROSUVASTATIN CALCIUM 40 MG: 40 | 90 days supply | Qty: 90 | Fill #0

## 2019-04-23 MED FILL — CLOPIDOGREL 75 MG TABLET: 75 | 90 days supply | Qty: 90 | Fill #0

## 2019-04-24 ENCOUNTER — Telehealth: Payer: Self-pay

## 2019-04-24 NOTE — Telephone Encounter (Signed)
Contacted pt to go over lab results pt is aware and doesn't have any questions or concerns 

## 2019-04-27 ENCOUNTER — Ambulatory Visit: Payer: Self-pay | Admitting: Surgery

## 2019-04-27 DIAGNOSIS — C50411 Malignant neoplasm of upper-outer quadrant of right female breast: Secondary | ICD-10-CM | POA: Diagnosis not present

## 2019-04-27 NOTE — H&P (Signed)
History of Present Illness Sandra Shelton. Sandra Ortman MD; 04/27/2019 2:10 PM) The patient is a 64 year old female who presents with breast cancer. Former patient of Dr. Dalbert Shelton Oncology - Dr. Samule Shelton Oncology - Dr. Sondra Shelton PCP is Sandra Shelton.  Sandra Shelton - Vascular; placed her left carotid stent after she had a stroke.   This is a 64 year old female who presented with breast cancer. In January, 2020 she underwent right lumpectomy and sentinel node biopsy. An 8 mm invasive cancer, receptor positive, HER-2 positive, stage TI cN0. She had Port-A-Cath placed on April 09, 2018 after final path was reviewed. She has completed all of her cytotoxic chemotherapy and is now getting Herceptin every 3 weeks through April 05, 2019. The Port-A-Cath has been working well. She is taking anastrozole as well. Radiation therapy was completed on August 22, 2018. She still has radiation changes There was a little thickening in her lumpectomy incision and we did imaging studies in June which showed this to be a small seroma, category 2 findings. She continues to take Plavix and has no cerebrovascular symptoms  Mammogram 02/25/19 was unremarkable  CLINICAL DATA: History of right breast cancer status post lumpectomy in 2020.  EXAM: DIGITAL DIAGNOSTIC BILATERAL MAMMOGRAM WITH CAD AND TOMO  COMPARISON: Previous exam(s).  ACR Breast Density Category c: The breast tissue is heterogeneously dense, which may obscure small masses.  FINDINGS: Lumpectomy changes are seen in the right breast. No suspicious mass or malignant type microcalcifications identified in either breast.  Mammographic images were processed with CAD.  IMPRESSION: No evidence of malignancy in either breast.  RECOMMENDATION: Bilateral diagnostic mammogram in 1 year is recommended.  I have discussed the findings and recommendations with the patient. If applicable, a reminder letter will be sent to the patient regarding the next  appointment.  BI-RADS CATEGORY 2: Benign.   Electronically Signed By: Sandra Shelton M.D. On: 02/25/2019 13:30   Allergies Sandra Shelton, CMA; 04/27/2019 9:12 AM) No Known Allergies [02/10/2018]: Allergies Reconciled  Medication History Sandra Shelton, CMA; 04/27/2019 9:13 AM) Anastrozole (1MG Tablet, Oral) Active. Clopidogrel Bisulfate (75MG Tablet, Oral) Active. Aspirin (81MG Tablet, Oral) Active. Lantus SoloStar (100UNIT/ML Soln Pen-inj, Subcutaneous) Active. Lisinopril (5MG Tablet, Oral) Active. metFORMIN HCl (500MG Tablet, Oral) Active. Rosuvastatin Calcium (40MG Tablet, Oral) Active. Medications Reconciled    Vitals Sandra Shelton CMA; 04/27/2019 9:12 AM) 04/27/2019 9:12 AM Weight: 193.8 lb Height: 65in Body Surface Area: 1.95 m Body Mass Index: 32.25 kg/m  Temp.: 97.52F  Pulse: 101 (Regular)  BP: 148/80 (Sitting, Left Arm, Standard)        Physical Exam Sandra Key K. Ryman Rathgeber MD; 04/27/2019 2:12 PM)  The physical exam findings are as follows: Note:Constitutional: WDWN in NAD, conversant, no obvious deformities; Eyes: Pupils equal, round; sclera anicteric; moist conjunctiva; no lid lag HENT: Oral mucosa moist; good dentition Neck: No masses palpated, trachea midline; no thyromegaly Lungs: CTA bilaterally; normal respiratory effort Breasts: symmetric; R subclavian port site c/d/i; Symmetrical with good contour and good nipple line. On the right side she has some hyperpigmentation and some skin thickening from radiation therapy. Transverse lumpectomy scar at 9:00 positions slightly thickened. Right axillary scar soft and well healed. There is no other mass or skin changes in either breast. There is no axillary adenopathy. CV: Regular rate and rhythm; no murmurs; extremities well-perfused with no edema Abd: +bowel sounds, soft, non-tender, no palpable organomegaly; no palpable hernias Musc: Normal gait; no apparent clubbing or  cyanosis in extremities Lymphatic: No palpable cervical or axillary lymphadenopathy  Skin: Warm, dry; no sign of jaundice Psychiatric - alert and oriented x 4; calm mood and affect    Assessment & Plan Sandra Key K. Kimani Hovis MD; 04/27/2019 9:21 AM)  PRIMARY CANCER OF UPPER OUTER QUADRANT OF RIGHT FEMALE BREAST (C50.411)  Current Plans Schedule for Surgery - port removal. The surgical procedure has been discussed with the patient. Potential risks, benefits, alternative treatments, and expected outcomes have been explained. All of the patient's questions at this time have been answered. The likelihood of reaching the patient's treatment goal is good. The patient understand the proposed surgical procedure and wishes to proceed. Follow up in 1 year or as needed  Sandra Shelton. Georgette Dover, MD, [facility]  General/ Trauma Surgery   04/27/2019 2:13 PM

## 2019-04-27 NOTE — H&P (View-Only) (Signed)
History of Present Illness Sandra Shelton. Juliah Scadden MD; 04/27/2019 2:10 PM) The patient is a 64 year old female who presents with breast cancer. Former patient of Dr. Dalbert Batman Oncology - Dr. Samule Ohm Oncology - Dr. Sondra Come PCP is [name].  Dr.Fields - Vascular; placed her left carotid stent after she had a stroke.   This is a 64 year old female who presented with breast cancer. In January, 2020 she underwent right lumpectomy and sentinel node biopsy. An 8 mm invasive cancer, receptor positive, HER-2 positive, stage TI cN0. She had Port-A-Cath placed on April 09, 2018 after final path was reviewed. She has completed all of her cytotoxic chemotherapy and is now getting Herceptin every 3 weeks through April 05, 2019. The Port-A-Cath has been working well. She is taking anastrozole as well. Radiation therapy was completed on August 22, 2018. She still has radiation changes There was a little thickening in her lumpectomy incision and we did imaging studies in June which showed this to be a small seroma, category 2 findings. She continues to take Plavix and has no cerebrovascular symptoms  Mammogram 02/25/19 was unremarkable  CLINICAL DATA: History of right breast cancer status post lumpectomy in 2020.  EXAM: DIGITAL DIAGNOSTIC BILATERAL MAMMOGRAM WITH CAD AND TOMO  COMPARISON: Previous exam(s).  ACR Breast Density Category c: The breast tissue is heterogeneously dense, which may obscure small masses.  FINDINGS: Lumpectomy changes are seen in the right breast. No suspicious mass or malignant type microcalcifications identified in either breast.  Mammographic images were processed with CAD.  IMPRESSION: No evidence of malignancy in either breast.  RECOMMENDATION: Bilateral diagnostic mammogram in 1 year is recommended.  I have discussed the findings and recommendations with the patient. If applicable, a reminder letter will be sent to the patient regarding the next  appointment.  BI-RADS CATEGORY 2: Benign.   Electronically Signed By: Lillia Mountain M.D. On: 02/25/2019 13:30   Allergies Emeline Gins, CMA; 04/27/2019 9:12 AM) No Known Allergies [02/10/2018]: Allergies Reconciled  Medication History Emeline Gins, CMA; 04/27/2019 9:13 AM) Anastrozole (1MG Tablet, Oral) Active. Clopidogrel Bisulfate (75MG Tablet, Oral) Active. Aspirin (81MG Tablet, Oral) Active. Lantus SoloStar (100UNIT/ML Soln Pen-inj, Subcutaneous) Active. Lisinopril (5MG Tablet, Oral) Active. metFORMIN HCl (500MG Tablet, Oral) Active. Rosuvastatin Calcium (40MG Tablet, Oral) Active. Medications Reconciled    Vitals Emeline Gins CMA; 04/27/2019 9:12 AM) 04/27/2019 9:12 AM Weight: 193.8 lb Height: 65in Body Surface Area: 1.95 m Body Mass Index: 32.25 kg/m  Temp.: 97.52F  Pulse: 101 (Regular)  BP: 148/80 (Sitting, Left Arm, Standard)        Physical Exam Rodman Key K. Jesusmanuel Erbes MD; 04/27/2019 2:12 PM)  The physical exam findings are as follows: Note:Constitutional: WDWN in NAD, conversant, no obvious deformities; Eyes: Pupils equal, round; sclera anicteric; moist conjunctiva; no lid lag HENT: Oral mucosa moist; good dentition Neck: No masses palpated, trachea midline; no thyromegaly Lungs: CTA bilaterally; normal respiratory effort Breasts: symmetric; R subclavian port site c/d/i; Symmetrical with good contour and good nipple line. On the right side she has some hyperpigmentation and some skin thickening from radiation therapy. Transverse lumpectomy scar at 9:00 positions slightly thickened. Right axillary scar soft and well healed. There is no other mass or skin changes in either breast. There is no axillary adenopathy. CV: Regular rate and rhythm; no murmurs; extremities well-perfused with no edema Abd: +bowel sounds, soft, non-tender, no palpable organomegaly; no palpable hernias Musc: Normal gait; no apparent clubbing or  cyanosis in extremities Lymphatic: No palpable cervical or axillary lymphadenopathy  Skin: Warm, dry; no sign of jaundice Psychiatric - alert and oriented x 4; calm mood and affect    Assessment & Plan Rodman Key K. Lexani Corona MD; 04/27/2019 9:21 AM)  PRIMARY CANCER OF UPPER OUTER QUADRANT OF RIGHT FEMALE BREAST (C50.411)  Current Plans Schedule for Surgery - port removal. The surgical procedure has been discussed with the patient. Potential risks, benefits, alternative treatments, and expected outcomes have been explained. All of the patient's questions at this time have been answered. The likelihood of reaching the patient's treatment goal is good. The patient understand the proposed surgical procedure and wishes to proceed. Follow up in 1 year or as needed  Sandra Shelton. Georgette Dover, MD, [facility]  General/ Trauma Surgery   04/27/2019 2:13 PM

## 2019-05-11 MED FILL — ANASTROZOLE 1 MG TABLET: 1 | 90 days supply | Qty: 90 | Fill #4

## 2019-05-12 ENCOUNTER — Ambulatory Visit
Admission: RE | Admit: 2019-05-12 | Discharge: 2019-05-12 | Disposition: A | Discharge status: Home / Self Care | Payer: Medicare Other | Source: Ambulatory Visit | Attending: Adult Health | Admitting: Adult Health

## 2019-05-12 ENCOUNTER — Telehealth: Payer: Self-pay | Admitting: *Deleted

## 2019-05-12 ENCOUNTER — Other Ambulatory Visit: Payer: Self-pay

## 2019-05-12 DIAGNOSIS — Z780 Asymptomatic menopausal state: Secondary | ICD-10-CM | POA: Diagnosis not present

## 2019-05-12 DIAGNOSIS — E2839 Other primary ovarian failure: Secondary | ICD-10-CM

## 2019-05-12 NOTE — Telephone Encounter (Signed)
Pt's med list states pt is onPlavix- Called Pt- LMTRC to verify- IF pt is on Plavix, she will need an OV and cancel her 5-10 PV

## 2019-05-12 NOTE — Telephone Encounter (Signed)
-----   Message from Gardenia Phlegm, NP sent at 05/12/2019 12:06 PM EDT ----- Please call patient with normal results.  Continue calcium vitamin d and weight bearing exercises. ----- Message ----- From: Interface, Rad Results In Sent: 05/12/2019   9:39 AM EDT To: Gardenia Phlegm, NP

## 2019-05-12 NOTE — Telephone Encounter (Signed)
Notified of message below

## 2019-05-13 ENCOUNTER — Encounter: Payer: Self-pay | Admitting: Nurse Practitioner

## 2019-05-13 NOTE — Telephone Encounter (Signed)
Patient called back, she is on plavix. OV was made with Nevin Bloodgood on 05/27/2019. Colon and PV cancelled.

## 2019-05-15 ENCOUNTER — Encounter (HOSPITAL_COMMUNITY): Payer: Self-pay | Admitting: Surgery

## 2019-05-15 ENCOUNTER — Other Ambulatory Visit (HOSPITAL_COMMUNITY)
Admission: RE | Admit: 2019-05-15 | Discharge: 2019-05-15 | Disposition: A | Discharge status: Home / Self Care | Payer: Medicare Other | Source: Ambulatory Visit | Attending: Surgery | Admitting: Surgery

## 2019-05-15 DIAGNOSIS — Z20822 Contact with and (suspected) exposure to covid-19: Secondary | ICD-10-CM | POA: Diagnosis not present

## 2019-05-15 DIAGNOSIS — Z01812 Encounter for preprocedural laboratory examination: Secondary | ICD-10-CM | POA: Insufficient documentation

## 2019-05-15 LAB — SARS CORONAVIRUS 2 (TAT 6-24 HRS): SARS Coronavirus 2: NEGATIVE

## 2019-05-18 ENCOUNTER — Other Ambulatory Visit: Payer: Self-pay

## 2019-05-18 ENCOUNTER — Encounter (HOSPITAL_COMMUNITY): Payer: Self-pay | Admitting: Surgery

## 2019-05-18 NOTE — Anesthesia Preprocedure Evaluation (Addendum)
Anesthesia Evaluation  Patient identified by MRN, date of birth, ID band Patient awake    Reviewed: Allergy & Precautions, NPO status , Patient's Chart, lab work & pertinent test results  Airway Mallampati: II  TM Distance: >3 FB     Dental  (+) Teeth Intact, Dental Advisory Given   Pulmonary    breath sounds clear to auscultation       Cardiovascular hypertension,  Rhythm:Regular Rate:Normal     Neuro/Psych    GI/Hepatic   Endo/Other  diabetes  Renal/GU      Musculoskeletal   Abdominal (+) + obese,   Peds  Hematology   Anesthesia Other Findings   Reproductive/Obstetrics                           Anesthesia Physical Anesthesia Plan  ASA: III  Anesthesia Plan: MAC   Post-op Pain Management:    Induction:   PONV Risk Score and Plan: Ondansetron and Dexamethasone  Airway Management Planned: Natural Airway and Simple Face Mask  Additional Equipment:   Intra-op Plan:   Post-operative Plan:   Informed Consent: I have reviewed the patients History and Physical, chart, labs and discussed the procedure including the risks, benefits and alternatives for the proposed anesthesia with the patient or authorized representative who has indicated his/her understanding and acceptance.     Dental advisory given  Plan Discussed with: CRNA and Anesthesiologist  Anesthesia Plan Comments: (PAT note written 05/18/2019 by Myra Gianotti, PA-C. )       Anesthesia Quick Evaluation

## 2019-05-18 NOTE — Progress Notes (Signed)
Patient denies shortness of breath, fever, cough and chest pain.  PCP - Dr [name] Cardiologist - n/a Oncology - Dr Nicholas Lose Vascular - Dr Oneida Alar  Chest x-ray - 04/09/18 (1V) EKG - DOS Stress Test - n/a ECHO - 02/12/19 Cardiac Cath - n/a  Sleep Study - Yes (03/20/18) CPAP - Does not use CPAP  Fasting Blood Sugar - 130s-140s Checks Blood Sugar 1  time a day  . Do not take oral diabetes medicines (Metformin) the morning of surgery.  . THE NIGHT BEFORE SURGERY, take 3 units Lantus Insulin.     . THE MORNING OF SURGERY, take 3 units Lantus Insulin.  . If your blood sugar is less than 70 mg/dL, you will need to treat for low blood sugar: o Treat a low blood sugar (less than 70 mg/dL) with  cup of clear juice (cranberry or apple), 4 glucose tablets, OR glucose gel. o Recheck blood sugar in 15 minutes after treatment (to make sure it is greater than 70 mg/dL). If your blood sugar is not greater than 70 mg/dL on recheck, call [phone] for further instructions.  Blood Thinner Instructions:  Plavix last dose on Thursday, 05/14/19.  Aspirin Instructions:  ASA last dose on Thursday, 05/14/19.  Anesthesia review: Yes  STOP now taking any Aspirin (unless otherwise instructed by your surgeon), Aleve, Naproxen, Ibuprofen, Motrin, Advil, Goody's, BC's, all herbal medications, fish oil, and all vitamins.   Coronavirus Screening Coivd test on 05/15/19 was negative.  Patient verbalized understanding of instructions that were given via phone.

## 2019-05-18 NOTE — Progress Notes (Signed)
Anesthesia Chart Review: Sandra Shelton   Case: [id number] Date/Time: 05/19/19 0930   Procedure: PORT REMOVAL (N/A )   Anesthesia type: General   Pre-op diagnosis: HISTORY OF BREAST CANCER   Location: [place] OR ROOM 08 / [place] OR   Surgeons: Donnie Mesa, MD      DISCUSSION: Patient is a 64 year old female scheduled for the above procedure.  History includes never smoker, HTN, DM2, CVA 07/08/17 (during hospitalization for DKA, found to have severe LICA stenosis), left carotid endarterectomy (07/24/17), right breast cancer (s/p right breast lumpectomy, right axillary sentinel node biopsy 03/24/18; PowerPort insertion 04/09/18; s/p chemoradiation; completed [name] 03/31/19), murmur (no significant valvular disease 01/2019 echo), GERD, hepatitis (s/p treatment in the 1980's), OSA (by 03/17/18 sleep study; does not use CPAP)   Last ASA and Plavix 05/14/19.  A1c on 04/21/19 was 7.9%.  Presurgical COVID-19 test negative on 05/15/2019.  Anesthesia team to evaluate on the day of surgery.   VS:   Wt Readings from Last 3 Encounters:  04/21/19 88.4 kg  03/31/19 88.7 kg  03/10/19 88.8 kg   BP Readings from Last 3 Encounters:  04/21/19 138/60  03/31/19 (!) 146/70  03/10/19 (!) 152/69   Pulse Readings from Last 3 Encounters:  04/21/19 92  03/31/19 79  03/10/19 93    PROVIDERS: [name], MD his PCP Lind Guest, MD is HEM-ONC Gery Pray, MD is RAD-ONC [name], MD is vascular surgeon   LABS: Per Anesthesia. Last results include: Lab Results  Component Value Date   WBC 6.4 03/10/2019   HGB 12.2 03/10/2019   HCT 37.7 03/10/2019   PLT 278 03/10/2019   GLUCOSE 347 (H) 03/10/2019   ALT 21 03/10/2019   AST 13 (L) 03/10/2019   NA 137 03/10/2019   K 4.0 03/10/2019   CL 104 03/10/2019   CREATININE 1.04 (H) 03/10/2019   BUN 16 03/10/2019   CO2 22 03/10/2019   HGBA1C 7.9 (A) 04/21/2019    Sleep Study 03/17/18: IMPRESSION:  1. Complex, mostly Obstructive Sleep  Apnea (OSA)at a moderate  degree. AHI was 14.1/h,not supine dependent and strongly REM  sleep dependent. AHI in REM was 37/h.  2. Thunderous snoring - Snoring was recorded before the patient  fell asleep, in drowsiness.  3. No sleep hypoxemia.  4. normal EKG  RECOMMENDATIONS:  Advise full-night, attended, CPAP titration study to optimize  therapy. REM dependent sleep apnea is less likely responsive to  dental devices.    IMAGES: 1V PCXR (post-Port) 04/09/18: FINDINGS: Right chest wall port is now seen with the catheter tip in the superior right atrium. No pneumothorax is seen. No infiltrate is noted. Cardiac shadow is stable.   EKG: Last EKG currently available is from 02/24/18 (> 1 year ago) and showed NSR, possible LAE, lateral T wave abnormality (and more non-specific finding in inferior leads) which was also noted on 07/05/17 EKG and to an extent also on 10/28/11 tracing.    CV: Echo 02/12/19: IMPRESSIONS  1. Left ventricular ejection fraction, by visual estimation, is 60 to  65%. The left ventricle has normal function. There is no left ventricular  hypertrophy.  2. The left ventricle has no regional wall motion abnormalities.  3. Global right ventricle has normal systolic function.The right  ventricular size is normal. No increase in right ventricular wall  thickness.  4. Left atrial size was normal.  5. Right atrial size was normal.  6. The mitral valve is normal in structure. No evidence of mitral valve  regurgitation. No evidence of mitral stenosis.  7. The tricuspid valve is normal in structure.  8. The tricuspid valve is normal in structure. Tricuspid valve  regurgitation is not demonstrated.  9. The aortic valve is normal in structure. Aortic valve regurgitation is  not visualized. Mild aortic valve sclerosis without stenosis.  10. The pulmonic valve was normal in structure. Pulmonic valve  regurgitation is not visualized.  11. TR signal is inadequate for  assessing pulmonary artery systolic  pressure.  12. The inferior vena cava is normal in size with greater than 50%  respiratory variability, suggesting right atrial pressure of 3 mmHg.  68. Global longitudinal strain has improved since 11/11/2018 and is  similar to 07/31/2018.  14. Prior images reviewed side by side.  15. The average left ventricular global longitudinal strain is -17.8 %.    Carotid US 08/21/18: Summary:  - Right Carotid: Velocities in the right ICA are consistent with a 1-39% stenosis. Non-hemodynamically significant plaque <50% noted in the CCA. The ECA appears <50% stenosed.  - Left Carotid: Non-hemodynamically significant plaque noted in the CCA. The ECAappears <50% stenosed. Patent left common carotid artery to internal carotid artery stent without evidence of restenosis.  - Vertebrals: Bilateral vertebral arteries demonstrate antegrade flow.  - Subclavians: Normal flow hemodynamics were seen in bilateral subclavian arteries.    Past Medical History:  Diagnosis Date  . Breast cancer ([place])     IDC - right breast   . Diabetes mellitus without complication ([place])    type 2  . Embolic stroke involving carotid artery ([place])   . GERD (gastroesophageal reflux disease)   . Heart murmur    no problems with it  . Hepatitis    in the 80's, took meds at the time   . HLD (hyperlipidemia)   . Hypertension   . Peripheral vascular disease ([facility])    carotid artery blockage   . Personal history of chemotherapy    completed 03/2019  . Personal history of radiation therapy    completed  . Sleep apnea    Does not use CPAP  . Stroke ([place]) 11/20/2017  . Vision abnormalities    glasses    Past Surgical History:  Procedure Laterality Date  . BREAST EXCISIONAL BIOPSY Right   . BREAST LUMPECTOMY Right 03/2018  . BREAST LUMPECTOMY WITH RADIOACTIVE SEED AND SENTINEL LYMPH NODE BIOPSY Right 03/24/2018   Procedure: RIGHT BREAST LUMPECTOMY WITH RADIOACTIVE SEED AND RIGHT AXILLARY  DEEP SENTINEL LYMPH NODE BIOPSY INJECT BLUE DYE RIGHT BREAST;  Surgeon: Fanny Skates, MD;  Location: [facility];  Service: General;  Laterality: Right;  . COLONOSCOPY    . PORTACATH PLACEMENT N/A 04/09/2018   Procedure: INSERTION PORT-A-CATH WITH ULTRASOUND;  Surgeon: Fanny Skates, MD;  Location: [facility];  Service: General;  Laterality: N/A;  . TRANSCAROTID ARTERY REVASCULARIZATION Left 07/24/2017   Procedure: TRANSCAROTID ARTERY REVASCULARIZATION, left;  Surgeon: Elam Dutch, MD;  Location: [facility];  Service: Vascular;  Laterality: Left;  . UTERINE FIBROID SURGERY     2005    MEDICATIONS: No current facility-administered medications for this encounter.   Marland Kitchen amLODipine (NORVASC) 10 MG tablet  . anastrozole (ARIMIDEX) 1 MG tablet  . Artificial Tear Ointment (DRY EYES OP)  . aspirin 81 MG tablet  . CALCIUM PO  . clopidogrel (PLAVIX) 75 MG tablet  . Elderberry 575 MG/5ML SYRP  . LANTUS SOLOSTAR 100 UNIT/ML Solostar Pen  . lisinopril (ZESTRIL) 10 MG tablet  . metFORMIN (GLUCOPHAGE)  500 MG tablet  . rosuvastatin (CRESTOR) 40 MG tablet  . blood glucose meter kit and supplies KIT  . Insulin Pen Needle (TRUEPLUS PEN NEEDLES) 32G X 4 MM MISC    Myra Gianotti, PA-C Surgical Short Stay/Anesthesiology [facility] Phone [phone] [facility] Phone [phone] 05/18/2019 3:19 PM

## 2019-05-19 ENCOUNTER — Ambulatory Visit (HOSPITAL_COMMUNITY): Payer: Medicare Other | Admitting: Vascular Surgery

## 2019-05-19 ENCOUNTER — Encounter (HOSPITAL_COMMUNITY): Payer: Self-pay | Admitting: Surgery

## 2019-05-19 ENCOUNTER — Other Ambulatory Visit: Payer: Self-pay

## 2019-05-19 ENCOUNTER — Ambulatory Visit (HOSPITAL_COMMUNITY)
Admission: RE | Admit: 2019-05-19 | Discharge: 2019-05-19 | Disposition: A | Discharge status: Home / Self Care | Payer: Medicare Other | Attending: Surgery | Admitting: Surgery

## 2019-05-19 ENCOUNTER — Encounter (HOSPITAL_COMMUNITY)
Admission: RE | Disposition: A | Discharge status: Home / Self Care | Payer: Self-pay | Source: Home / Self Care | Attending: Surgery

## 2019-05-19 DIAGNOSIS — E1151 Type 2 diabetes mellitus with diabetic peripheral angiopathy without gangrene: Secondary | ICD-10-CM | POA: Insufficient documentation

## 2019-05-19 DIAGNOSIS — Z9221 Personal history of antineoplastic chemotherapy: Secondary | ICD-10-CM | POA: Diagnosis not present

## 2019-05-19 DIAGNOSIS — Z8673 Personal history of transient ischemic attack (TIA), and cerebral infarction without residual deficits: Secondary | ICD-10-CM | POA: Diagnosis not present

## 2019-05-19 DIAGNOSIS — Z79811 Long term (current) use of aromatase inhibitors: Secondary | ICD-10-CM | POA: Diagnosis not present

## 2019-05-19 DIAGNOSIS — Z923 Personal history of irradiation: Secondary | ICD-10-CM | POA: Insufficient documentation

## 2019-05-19 DIAGNOSIS — Z794 Long term (current) use of insulin: Secondary | ICD-10-CM | POA: Diagnosis not present

## 2019-05-19 DIAGNOSIS — I10 Essential (primary) hypertension: Secondary | ICD-10-CM | POA: Insufficient documentation

## 2019-05-19 DIAGNOSIS — Z452 Encounter for adjustment and management of vascular access device: Secondary | ICD-10-CM | POA: Insufficient documentation

## 2019-05-19 DIAGNOSIS — G4733 Obstructive sleep apnea (adult) (pediatric): Secondary | ICD-10-CM | POA: Insufficient documentation

## 2019-05-19 DIAGNOSIS — Z7902 Long term (current) use of antithrombotics/antiplatelets: Secondary | ICD-10-CM | POA: Insufficient documentation

## 2019-05-19 DIAGNOSIS — Z170 Estrogen receptor positive status [ER+]: Secondary | ICD-10-CM | POA: Diagnosis not present

## 2019-05-19 DIAGNOSIS — E785 Hyperlipidemia, unspecified: Secondary | ICD-10-CM | POA: Insufficient documentation

## 2019-05-19 DIAGNOSIS — Z79899 Other long term (current) drug therapy: Secondary | ICD-10-CM | POA: Insufficient documentation

## 2019-05-19 DIAGNOSIS — C50411 Malignant neoplasm of upper-outer quadrant of right female breast: Secondary | ICD-10-CM | POA: Insufficient documentation

## 2019-05-19 DIAGNOSIS — I6522 Occlusion and stenosis of left carotid artery: Secondary | ICD-10-CM | POA: Insufficient documentation

## 2019-05-19 DIAGNOSIS — Z7982 Long term (current) use of aspirin: Secondary | ICD-10-CM | POA: Insufficient documentation

## 2019-05-19 HISTORY — PX: PORT-A-CATH REMOVAL: SHX5289

## 2019-05-19 HISTORY — DX: Hyperlipidemia, unspecified: E78.5

## 2019-05-19 HISTORY — DX: Sleep apnea, unspecified: G47.30

## 2019-05-19 LAB — CBC
HCT: 40.8 % (ref 36.0–46.0)
Hemoglobin: 13 g/dL (ref 12.0–15.0)
MCH: 27.5 pg (ref 26.0–34.0)
MCHC: 31.9 g/dL (ref 30.0–36.0)
MCV: 86.4 fL (ref 80.0–100.0)
Platelets: 257 10*3/uL (ref 150–400)
RBC: 4.72 MIL/uL (ref 3.87–5.11)
RDW: 12.8 % (ref 11.5–15.5)
WBC: 6.1 10*3/uL (ref 4.0–10.5)
nRBC: 0 % (ref 0.0–0.2)

## 2019-05-19 LAB — COMPREHENSIVE METABOLIC PANEL
ALT: 19 U/L (ref 0–44)
AST: 18 U/L (ref 15–41)
Albumin: 4.1 g/dL (ref 3.5–5.0)
Alkaline Phosphatase: 52 U/L (ref 38–126)
Anion gap: 8 (ref 5–15)
BUN: 15 mg/dL (ref 8–23)
CO2: 22 mmol/L (ref 22–32)
Calcium: 9.8 mg/dL (ref 8.9–10.3)
Chloride: 108 mmol/L (ref 98–111)
Creatinine, Ser: 0.74 mg/dL (ref 0.44–1.00)
GFR calc Af Amer: >60 mL/min (ref >60)
GFR calc non Af Amer: >60 mL/min (ref >60)
Glucose, Bld: 166 mg/dL — ABNORMAL HIGH (ref 70–99)
Potassium: 3.9 mmol/L (ref 3.5–5.1)
Sodium: 138 mmol/L (ref 135–145)
Total Bilirubin: 0.4 mg/dL (ref 0.3–1.2)
Total Protein: 7.7 g/dL (ref 6.5–8.1)

## 2019-05-19 LAB — GLUCOSE, CAPILLARY
Glucose-Capillary: 159 mg/dL — ABNORMAL HIGH (ref 70–99)
Glucose-Capillary: 138 mg/dL — ABNORMAL HIGH (ref 70–99)

## 2019-05-19 SURGERY — REMOVAL PORT-A-CATH
Anesthesia: Monitor Anesthesia Care | Site: Chest

## 2019-05-19 MED ORDER — LIDOCAINE 2% (20 MG/ML) 5 ML SYRINGE
INTRAMUSCULAR | Status: AC
  Filled 2019-05-19: qty 5

## 2019-05-19 MED ORDER — CHLORHEXIDINE GLUCONATE CLOTH 2 % EX PADS: 6.0000 | MEDICATED_PAD | Freq: Once | CUTANEOUS | Status: DC

## 2019-05-19 MED ORDER — GLYCOPYRROLATE PF 0.2 MG/ML IJ SOSY
PREFILLED_SYRINGE | INTRAMUSCULAR | Status: AC
  Filled 2019-05-19: qty 1

## 2019-05-19 MED ORDER — LACTATED RINGERS IV SOLN: INTRAVENOUS | Status: DC

## 2019-05-19 MED ORDER — PROPOFOL 500 MG/50ML IV EMUL
INTRAVENOUS | Status: DC | PRN
  Administered 2019-05-19: 60 ug/kg/min via INTRAVENOUS

## 2019-05-19 MED ORDER — DEXMEDETOMIDINE HCL 200 MCG/2ML IV SOLN
INTRAVENOUS | Status: DC | PRN
  Administered 2019-05-19: 12 ug via INTRAVENOUS

## 2019-05-19 MED ORDER — DEXAMETHASONE SODIUM PHOSPHATE 10 MG/ML IJ SOLN
INTRAMUSCULAR | Status: AC
  Filled 2019-05-19: qty 1

## 2019-05-19 MED ORDER — PHENYLEPHRINE 40 MCG/ML (10ML) SYRINGE FOR IV PUSH (FOR BLOOD PRESSURE SUPPORT)
PREFILLED_SYRINGE | INTRAVENOUS | Status: AC
  Filled 2019-05-19: qty 10

## 2019-05-19 MED ORDER — BUPIVACAINE HCL (PF) 0.25 % IJ SOLN
INTRAMUSCULAR | Status: AC
  Filled 2019-05-19: qty 30

## 2019-05-19 MED ORDER — LACTATED RINGERS IV SOLN: INTRAVENOUS | Status: DC | PRN

## 2019-05-19 MED ORDER — FENTANYL CITRATE (PF) 250 MCG/5ML IJ SOLN
INTRAMUSCULAR | Status: AC
  Filled 2019-05-19: qty 5

## 2019-05-19 MED ORDER — ONDANSETRON HCL 4 MG/2ML IJ SOLN: 4.0000 mg | Freq: Once | INTRAMUSCULAR | Status: DC | PRN

## 2019-05-19 MED ORDER — FENTANYL CITRATE (PF) 100 MCG/2ML IJ SOLN
INTRAMUSCULAR | Status: DC | PRN
  Administered 2019-05-19 (×3): 50 ug via INTRAVENOUS

## 2019-05-19 MED ORDER — ONDANSETRON HCL 4 MG/2ML IJ SOLN
INTRAMUSCULAR | Status: DC | PRN
  Administered 2019-05-19: 4 mg via INTRAVENOUS

## 2019-05-19 MED ORDER — DEXAMETHASONE SODIUM PHOSPHATE 4 MG/ML IJ SOLN
INTRAMUSCULAR | Status: DC | PRN
  Administered 2019-05-19: 4 mg via INTRAVENOUS

## 2019-05-19 MED ORDER — ONDANSETRON HCL 4 MG/2ML IJ SOLN
INTRAMUSCULAR | Status: AC
  Filled 2019-05-19: qty 2

## 2019-05-19 MED ORDER — PROPOFOL 10 MG/ML IV BOLUS
INTRAVENOUS | Status: AC
  Filled 2019-05-19: qty 20

## 2019-05-19 MED ORDER — OXYCODONE HCL 5 MG/5ML PO SOLN: 5.0000 mg | Freq: Once | ORAL | Status: DC | PRN

## 2019-05-19 MED ORDER — MIDAZOLAM HCL 2 MG/2ML IJ SOLN
INTRAMUSCULAR | Status: AC
  Filled 2019-05-19: qty 2

## 2019-05-19 MED ORDER — MIDAZOLAM HCL 5 MG/5ML IJ SOLN
INTRAMUSCULAR | Status: DC | PRN
  Administered 2019-05-19 (×2): 1 mg via INTRAVENOUS

## 2019-05-19 MED ORDER — LIDOCAINE 2% (20 MG/ML) 5 ML SYRINGE
INTRAMUSCULAR | Status: DC | PRN
  Administered 2019-05-19 (×2): 50 mg via INTRAVENOUS

## 2019-05-19 MED ORDER — OXYCODONE HCL 5 MG PO TABS: 5.0000 mg | ORAL_TABLET | Freq: Once | ORAL | Status: DC | PRN

## 2019-05-19 MED ORDER — 0.9 % SODIUM CHLORIDE (POUR BTL) OPTIME
TOPICAL | Status: DC | PRN
  Administered 2019-05-19: 1000 mL

## 2019-05-19 MED ORDER — BUPIVACAINE HCL (PF) 0.25 % IJ SOLN
INTRAMUSCULAR | Status: DC | PRN
  Administered 2019-05-19: 16 mL

## 2019-05-19 MED ORDER — FENTANYL CITRATE (PF) 100 MCG/2ML IJ SOLN: 25.0000 ug | INTRAMUSCULAR | Status: DC | PRN

## 2019-05-19 MED ORDER — PROPOFOL 10 MG/ML IV BOLUS
INTRAVENOUS | Status: DC | PRN
  Administered 2019-05-19 (×2): 20 mg via INTRAVENOUS

## 2019-05-19 SURGICAL SUPPLY — 37 items
BENZOIN TINCTURE PRP APPL 2/3 (GAUZE/BANDAGES/DRESSINGS) ×3 IMPLANT
CHLORAPREP W/TINT 10.5 ML (MISCELLANEOUS) ×3 IMPLANT
CLOSURE WOUND 1/2 X4 (GAUZE/BANDAGES/DRESSINGS) ×1
COVER SURGICAL LIGHT HANDLE (MISCELLANEOUS) ×3 IMPLANT
COVER WAND RF STERILE (DRAPES) ×3 IMPLANT
DECANTER SPIKE VIAL GLASS SM (MISCELLANEOUS) ×3 IMPLANT
DERMABOND ADVANCED (GAUZE/BANDAGES/DRESSINGS) ×2
DERMABOND ADVANCED .7 DNX12 (GAUZE/BANDAGES/DRESSINGS) ×1 IMPLANT
DRAPE LAPAROTOMY T 102X78X121 (DRAPES) ×3 IMPLANT
DRSG TEGADERM 4X4.75 (GAUZE/BANDAGES/DRESSINGS) ×3 IMPLANT
ELECT REM PT RETURN 9FT ADLT (ELECTROSURGICAL) ×3
ELECTRODE REM PT RTRN 9FT ADLT (ELECTROSURGICAL) ×1 IMPLANT
GAUZE SPONGE 2X2 8PLY STRL LF (GAUZE/BANDAGES/DRESSINGS) ×1 IMPLANT
GAUZE SPONGE 4X4 12PLY STRL (GAUZE/BANDAGES/DRESSINGS) ×3 IMPLANT
GLOVE BIO SURGEON STRL SZ7 (GLOVE) ×3 IMPLANT
GLOVE BIOGEL PI IND STRL 7.5 (GLOVE) ×1 IMPLANT
GLOVE BIOGEL PI INDICATOR 7.5 (GLOVE) ×2
GOWN STRL REUS W/ TWL LRG LVL3 (GOWN DISPOSABLE) ×2 IMPLANT
GOWN STRL REUS W/TWL LRG LVL3 (GOWN DISPOSABLE) ×4
KIT BASIN OR (CUSTOM PROCEDURE TRAY) ×3 IMPLANT
KIT TURNOVER KIT B (KITS) ×3 IMPLANT
NEEDLE HYPO 25GX1X1/2 BEV (NEEDLE) ×3 IMPLANT
NS IRRIG 1000ML POUR BTL (IV SOLUTION) ×3 IMPLANT
PACK GENERAL/GYN (CUSTOM PROCEDURE TRAY) ×3 IMPLANT
PAD ARMBOARD 7.5X6 YLW CONV (MISCELLANEOUS) ×6 IMPLANT
PENCIL SMOKE EVACUATOR (MISCELLANEOUS) ×3 IMPLANT
SPONGE GAUZE 2X2 STER 10/PKG (GAUZE/BANDAGES/DRESSINGS) ×2
STRIP CLOSURE SKIN 1/2X4 (GAUZE/BANDAGES/DRESSINGS) ×2 IMPLANT
SUT MON AB 4-0 PC3 18 (SUTURE) ×3 IMPLANT
SUT VIC AB 3-0 SH 27 (SUTURE) ×2
SUT VIC AB 3-0 SH 27X BRD (SUTURE) ×1 IMPLANT
SYR CONTROL 10ML LL (SYRINGE) ×3 IMPLANT
TOWEL GREEN STERILE (TOWEL DISPOSABLE) ×3 IMPLANT
TOWEL GREEN STERILE FF (TOWEL DISPOSABLE) ×3 IMPLANT
TUBE CONNECTING 20'X1/4 (TUBING) ×1
TUBE CONNECTING 20X1/4 (TUBING) ×2 IMPLANT
YANKAUER SUCT BULB TIP NO VENT (SUCTIONS) ×3 IMPLANT

## 2019-05-19 NOTE — Anesthesia Procedure Notes (Signed)
Procedure Name: MAC Date/Time: 05/19/2019 10:24 AM Performed by: Orlie Dakin, CRNA Pre-anesthesia Checklist: Patient identified, Emergency Drugs available, Suction available and Patient being monitored Patient Re-evaluated:Patient Re-evaluated prior to induction Oxygen Delivery Method: Simple face mask Preoxygenation: Pre-oxygenation with 100% oxygen Induction Type: IV induction Placement Confirmation: positive ETCO2

## 2019-05-19 NOTE — Anesthesia Postprocedure Evaluation (Signed)
Anesthesia Post Note  Patient: Sandra Shelton  Procedure(s) Performed: PORT REMOVAL (N/A Chest)     Patient location during evaluation: PACU Anesthesia Type: General Level of consciousness: awake and alert Pain management: pain level controlled Vital Signs Assessment: post-procedure vital signs reviewed and stable Respiratory status: spontaneous breathing, nonlabored ventilation, respiratory function stable and patient connected to nasal cannula oxygen Cardiovascular status: blood pressure returned to baseline and stable Postop Assessment: no apparent nausea or vomiting Anesthetic complications: no    Last Vitals:  Vitals:   05/19/19 1105 05/19/19 1115  BP: (!) 166/77 (!) 144/77  Pulse: 99 100  Resp: 20 17  Temp:  36.5 C  SpO2: 100% 97%    Last Pain:  Vitals:   05/19/19 1116  TempSrc:   PainSc: 0-No pain                 Milea Klink COKER

## 2019-05-19 NOTE — Interval H&P Note (Signed)
History and Physical Interval Note:  05/19/2019 9:37 AM  Sandra Shelton  has presented today for surgery, with the diagnosis of HISTORY OF BREAST CANCER.  The various methods of treatment have been discussed with the patient and family. After consideration of risks, benefits and other options for treatment, the patient has consented to  Procedure(s): PORT REMOVAL (N/A) as a surgical intervention.  The patient's history has been reviewed, patient examined, no change in status, stable for surgery.  I have reviewed the patient's chart and labs.  Questions were answered to the patient's satisfaction.     Sandra Shelton

## 2019-05-19 NOTE — Discharge Instructions (Signed)
[place] Office Phone Number [phone]   POST OP INSTRUCTIONS  Always review your discharge instruction sheet given to you by the facility where your surgery was performed.  IF YOU HAVE DISABILITY OR FAMILY LEAVE FORMS, YOU MUST BRING THEM TO THE OFFICE FOR PROCESSING.  DO NOT GIVE THEM TO YOUR DOCTOR.  1. Yyou may take acetaminophen (Tylenol) or ibuprofen (Advil) as needed. 2. Take your usually prescribed medications unless otherwise directed 3. You should eat very light the first 24 hours after surgery, such as soup, crackers, pudding, etc.  Resume your normal diet the day after surgery. 4. Most patients will experience some swelling and bruising around the surgical site.  Ice packs will help.  Swelling and bruising can take several days to resolve.  5. You may remove your bandages 48 hours after surgery, and you may shower at that time.  You will have steri-strips (small skin tapes) in place directly over the incision.  These strips should be left on the skin for 7-10 days.   6. ACTIVITIES:  You may resume regular daily activities (gradually increasing) beginning the next day.   You may have sexual intercourse when it is comfortable. a. You may drive when you no longer are taking prescription pain medication, you can comfortably wear a seatbelt, and you can safely maneuver your car and apply brakes. b. RETURN TO WORK:  1-2 weeks 7. You should see your doctor in the office for a follow-up appointment approximately two to three weeks after your surgery.    WHEN TO CALL YOUR DOCTOR: 1. Fever over 101.0 2. Nausea and/or vomiting. 3. Extreme swelling or bruising. 4. Continued bleeding from incision. 5. Increased pain, redness, or drainage from the incision.  The clinic staff is available to answer your questions during regular business hours.  Please don't hesitate to call and ask to speak to one of the nurses for clinical concerns.  If you have a medical emergency, go  to the nearest emergency room or call 911.  A surgeon from [facility] is always on call at the hospital.  For further questions, please visit [email or web address]

## 2019-05-19 NOTE — Op Note (Signed)
Preop diagnosis: Right breast cancer status post chemotherapy Postop diagnosis: Same Procedure performed: Port removal Surgeon:Anahid Eskelson K Natilie Krabbenhoft Anesthesia: Local MAC Indications: This is a 64 year old female who is a former patient of Dr. Darrel Hoover.  She is status post lumpectomy, sentinel node biopsy, and chemotherapy.  She presents now for port removal.  The port was placed last year in her right subclavian vein.  Description of procedure: The patient is brought to the operating room and placed in the supine position on the operating room table.  She was given some intravenous sedation.  Her chest was prepped with ChloraPrep and draped sterile fashion.  A timeout was taken.  I infiltrated the area over the port with 0.25% Marcaine.  A transverse incision was made.  We dissected down to the port.  We removed 3 stay sutures.  The port was removed.  There was some oozing from the subcutaneous tunnel.  We held pressure and I closed the tunnel with 3-0 Vicryl suture.  Most of the fibrin sheath was excised.  We reapproximated the wound with 3-0 Vicryl and closed the skin with 4-0 Monocryl.  Benzoin and Steri-Strips were applied.  The patient was then awakened and brought to the recovery room in stable condition.  All sponge, instrument, and needle counts are correct.  Imogene Burn. Georgette Dover, MD, [facility] Surgery  General/ Trauma Surgery   05/19/2019 10:44 AM

## 2019-05-19 NOTE — Transfer of Care (Signed)
Immediate Anesthesia Transfer of Care Note  Patient: Sandra Shelton  Procedure(s) Performed: PORT REMOVAL (N/A Chest)  Patient Location: PACU  Anesthesia Type:MAC  Level of Consciousness: drowsy  Airway & Oxygen Therapy: Patient Spontanous Breathing and Patient connected to face mask oxygen  Post-op Assessment: Report given to RN and Post -op Vital signs reviewed and stable  Post vital signs: Reviewed and stable  Last Vitals:  Vitals Value Taken Time  BP    Temp    Pulse    Resp    SpO2      Last Pain:  Vitals:   05/19/19 0812  TempSrc:   PainSc: 0-No pain      Patients Stated Pain Goal: 3 (16/38/46 6599)  Complications: No apparent anesthesia complications

## 2019-05-19 NOTE — Anesthesia Postprocedure Evaluation (Signed)
Anesthesia Post Note  Patient: Sandra Shelton  Procedure(s) Performed: PORT REMOVAL (N/A Chest)     Patient location during evaluation: PACU Anesthesia Type: MAC Level of consciousness: awake and alert Pain management: pain level controlled Vital Signs Assessment: post-procedure vital signs reviewed and stable Respiratory status: spontaneous breathing, nonlabored ventilation, respiratory function stable and patient connected to nasal cannula oxygen Cardiovascular status: blood pressure returned to baseline and stable Postop Assessment: no apparent nausea or vomiting Anesthetic complications: no    Last Vitals:  Vitals:   05/19/19 1105 05/19/19 1115  BP: (!) 166/77 (!) 144/77  Pulse: 99 100  Resp: 20 17  Temp:  36.5 C  SpO2: 100% 97%    Last Pain:  Vitals:   05/19/19 1116  TempSrc:   PainSc: 0-No pain                 Kerri-Anne Haeberle COKER

## 2019-05-27 ENCOUNTER — Encounter: Payer: Self-pay | Admitting: Nurse Practitioner

## 2019-05-27 ENCOUNTER — Ambulatory Visit (INDEPENDENT_AMBULATORY_CARE_PROVIDER_SITE_OTHER): Payer: Medicare Other | Admitting: Nurse Practitioner

## 2019-05-27 ENCOUNTER — Telehealth: Payer: Self-pay

## 2019-05-27 ENCOUNTER — Other Ambulatory Visit: Payer: Self-pay

## 2019-05-27 VITALS — BP 146/70 | HR 92 | Temp 97.2°F | Ht 65.0 in | Wt 196.8 lb

## 2019-05-27 DIAGNOSIS — Z1211 Encounter for screening for malignant neoplasm of colon: Secondary | ICD-10-CM

## 2019-05-27 NOTE — Progress Notes (Signed)
ASSESSMENT / PLAN:   64 year old female with PMH significant for diabetes, obesity hypertension, CVA, carotid artery disease on Plavix/aspirin, breast cancer  # Colon cancer screening --Last colonoscopy greater than 10 years ago in [place]. --No family medical history colon cancer --No rectal bleeding, bowel changes or other alarm features --Patient was scheduled for screening colonoscopy. Patient will be scheduled for a colonoscopy. The risks and benefits of colonoscopy with possible polypectomy / biopsies were discussed and the patient agrees to proceed.    # Carotid artery disease / CVA / Hix of trans-carotid artery revascularization .  --Hold Plavix for 5 days before procedure - will instruct when and how to resume after procedure. Patient understands that there is a low but real risk of cardiovascular event such as heart attack, stroke, or embolism /  thrombosis, or ischemia while off Plavix. The patient consents to proceed. Will communicate by phone or EMR with patient's prescribing provider to confirm that holding Plavix is reasonable in this case.    # History of right breast cancer,  T1BNO,  year 2020 --Status post chemotherapy --On Arimidex  HPI:     Chief Complaint: None   Sandra Shelton with history of breast cancer last year.  Patient is referred by PCP to discuss screening colonoscopy. She is on Plavix for history of carotid artery disease/CVA.  Patient has no GI complaints.  Specifically, no bowel changes, no blood in stool.  She has no family history colon cancer.  Her last colonoscopy was in [place] greater than 10 years ago.  She believes exam was normal.   Data Reviewed:  05/19/19 CBC normal LFTs normal.     Past Medical History:  Diagnosis Date  . Breast cancer ([facility])     IDC - right breast   . Diabetes mellitus without complication ([facility])    type 2  . Embolic stroke involving carotid artery ([facility])   . GERD (gastroesophageal  reflux disease)   . Heart murmur    no problems with it  . Hepatitis    in the 80's, took meds at the time   . HLD (hyperlipidemia)   . Hypertension   . Peripheral vascular disease ([facility])    carotid artery blockage   . Personal history of chemotherapy    completed 03/2019  . Personal history of radiation therapy    completed  . Sleep apnea    Does not use CPAP  . Stroke ([facility]) 11/20/2017  . Vision abnormalities    glasses     Past Surgical History:  Procedure Laterality Date  . BREAST EXCISIONAL BIOPSY Right   . BREAST LUMPECTOMY Right 03/2018  . BREAST LUMPECTOMY WITH RADIOACTIVE SEED AND SENTINEL LYMPH NODE BIOPSY Right 03/24/2018   Procedure: RIGHT BREAST LUMPECTOMY WITH RADIOACTIVE SEED AND RIGHT AXILLARY DEEP SENTINEL LYMPH NODE BIOPSY INJECT BLUE DYE RIGHT BREAST;  Surgeon: Fanny Skates, MD;  Location: [facility];  Service: General;  Laterality: Right;  . COLONOSCOPY    . PORT-A-CATH REMOVAL N/A 05/19/2019   Procedure: PORT REMOVAL;  Surgeon: Donnie Mesa, MD;  Location: [facility];  Service: General;  Laterality: N/A;  . PORTACATH PLACEMENT N/A 04/09/2018   Procedure: INSERTION PORT-A-CATH WITH ULTRASOUND;  Surgeon: Fanny Skates, MD;  Location: [facility];  Service: General;  Laterality: N/A;  . TRANSCAROTID ARTERY REVASCULARIZATION Left 07/24/2017   Procedure: TRANSCAROTID ARTERY REVASCULARIZATION, left;  Surgeon: Elam Dutch, MD;  Location:  [facility];  Service: Vascular;  Laterality: Left;  . UTERINE FIBROID SURGERY     2005   Family History  Problem Relation Age of Onset  . Diabetes Mellitus II Sister   . Stroke Mother   . Diabetes Mellitus II Mother   . Prostate cancer Father   . Diabetes Mellitus II Brother   . Breast cancer Maternal Aunt   . Diabetes Maternal Aunt   . Colon cancer Neg Hx   . Esophageal cancer Neg Hx   . Stomach cancer Neg Hx   . Pancreatic cancer Neg Hx   . Liver disease Neg Hx    Social History   Tobacco  Use  . Smoking status: Never Smoker  . Smokeless tobacco: Never Used  Substance Use Topics  . Alcohol use: No  . Drug use: No   Current Outpatient Medications  Medication Sig Dispense Refill  . amLODipine (NORVASC) 10 MG tablet Take 1 tablet (10 mg total) by mouth daily. 90 tablet 2  . anastrozole (ARIMIDEX) 1 MG tablet Take 1 tablet (1 mg total) by mouth daily. 90 tablet 3  . Artificial Tear Ointment (DRY EYES OP) Place 1 drop into both eyes daily as needed (Dry eyes).    Marland Kitchen aspirin 81 MG tablet Take 1 tablet (81 mg total) by mouth daily. 100 tablet 2  . blood glucose meter kit and supplies KIT Dispense based on patient and insurance preference. Use up to four times daily as directed. (FOR ICD-9 250.00, 250.01). 1 each 0  . CALCIUM PO Take 15 mLs by mouth daily.    . clopidogrel (PLAVIX) 75 MG tablet TAKE 1 TABLET (75 MG TOTAL) BY MOUTH DAILY. 60 tablet 6  . Elderberry 575 MG/5ML SYRP Take 15 mLs by mouth daily as needed.     . Insulin Pen Needle (TRUEPLUS PEN NEEDLES) 32G X 4 MM MISC Use as directed to inject insulin 100 each 3  . LANTUS SOLOSTAR 100 UNIT/ML Solostar Pen Inject 7 Units into the skin 2 (two) times daily. 15 mL 6  . lisinopril (ZESTRIL) 10 MG tablet Take 1 tablet (10 mg total) by mouth daily. 90 tablet 3  . metFORMIN (GLUCOPHAGE) 500 MG tablet 2 tabs in morning and one in the evening PO (Patient taking differently: Take 1,000 mg by mouth 2 (two) times daily with a meal. ) 270 tablet 3  . rosuvastatin (CRESTOR) 40 MG tablet Take 1 tablet (40 mg total) by mouth daily. (Patient taking differently: Take 40 mg by mouth at bedtime. ) 30 tablet 2   No current facility-administered medications for this visit.   No Known Allergies   Review of Systems:  All systems reviewed and negative except where noted in HPI.   Serum creatinine: 0.74 mg/dL 05/19/19 0730 Estimated creatinine clearance: 76.3 mL/min   Physical Exam:    Wt Readings from Last 3 Encounters:  05/27/19 196 lb  12.8 oz (89.3 kg)  05/19/19 192 lb (87.1 kg)  04/21/19 194 lb 12.8 oz (88.4 kg)    BP (!) 146/70   Pulse 92 Comment: slightly irregular  Temp (!) 97.2 F (36.2 C)   Ht 5' 5"  (1.651 m)   Wt 196 lb 12.8 oz (89.3 kg)   LMP  (LMP Unknown)   BMI 32.75 kg/m  Constitutional:  Pleasant female in no acute distress. Psychiatric: Normal mood and affect. Behavior is normal. EENT: Pupils normal.  Conjunctivae are normal. No scleral icterus. Neck supple.  Cardiovascular: Normal rate, regular rhythm. No edema  Pulmonary/chest: Effort normal and breath sounds normal. No wheezing, rales or rhonchi. Abdominal: Soft, nondistended, nontender. Bowel sounds active throughout. There are no masses palpable. No hepatomegaly. Neurological: Alert and oriented to person place and time. Skin: Skin is warm and dry. No rashes noted.  Tye Savoy, NP  05/27/2019, 9:54 AM  Cc:  Referring Provider [name], MD

## 2019-05-27 NOTE — Patient Instructions (Signed)
If you are age 66 or older, your body mass index should be between 23-30. Your Body mass index is 32.75 kg/m. If this is out of the aforementioned range listed, please consider follow up with your Primary Care Provider.  If you are age 33 or younger, your body mass index should be between 19-25. Your Body mass index is 32.75 kg/m. If this is out of the aformentioned range listed, please consider follow up with your Primary Care Provider.   You have been scheduled for a colonoscopy. Please follow written instructions given to you at your visit today.  Please pick up your prep supplies at the pharmacy within the next 1-3 days. If you use inhalers (even only as needed), please bring them with you on the day of your procedure.  You will be contaced by our office prior to your procedure for directions on holding your Plavix.  If you do not hear from our office 1 week prior to your scheduled procedure, please call [phone] to discuss.  Follow up pending Colonoscopy and as needed.

## 2019-05-27 NOTE — Progress Notes (Signed)
I agree with the above note, plan 

## 2019-05-27 NOTE — Telephone Encounter (Signed)
Pre-operative Risk Assessment     Request for surgical clearance:     Endoscopy Procedure  What type of surgery is being performed?     Colonoscopy  When is this surgery scheduled?     07/21/19  What type of clearance is required ?   Pharmacy  Are there any medications that need to be held prior to surgery and how long? Plavix starting 5 days prior  Practice name and name of physician performing surgery?      [facility]  What is your office phone and fax number?      Phone- [phone]  [phone]  Anesthesia type (None, local, MAC, general) ?       MAC

## 2019-06-08 ENCOUNTER — Encounter: Payer: Medicare Other | Admitting: Gastroenterology

## 2019-06-17 MED FILL — METFORMIN HCL 500 MG TABS: 500 | 90 days supply | Qty: 180 | Fill #3

## 2019-07-01 ENCOUNTER — Other Ambulatory Visit: Payer: Self-pay | Admitting: Internal Medicine

## 2019-07-01 DIAGNOSIS — I10 Essential (primary) hypertension: Secondary | ICD-10-CM

## 2019-07-02 MED FILL — AMLODIPINE BESYLATE 10 MG T: 10 | 90 days supply | Qty: 90 | Fill #0

## 2019-07-03 NOTE — Telephone Encounter (Signed)
Left patient a voicemail to return phone call about stopping her Plavix prior to her colonoscopy. She stop it on July 1st which would be 5 days prior.

## 2019-07-06 NOTE — Telephone Encounter (Signed)
Patient has been notified to stop her Plavix on July 1, 5 days prior to her Colonoscopy and that she can restart after her procedure.

## 2019-07-16 IMAGING — CT CT HEAD W/O CM
4 series · 17 of 47 positions shown, 19 images · non-contrast
Comparison: MRI brain 10/28/2011

CLINICAL DATA: Altered level of consciousness. Patient was found in
bed. Responsive to painful stimuli.

EXAM:
CT HEAD WITHOUT CONTRAST
TECHNIQUE: Contiguous axial images were obtained from the base of the skull
through the vertex without intravenous contrast.

[Series 3: head wo · axial · 0.43mm/px · z∈[-108,+17]mm · 7 of 35 slices shown, 9 images]
[im 5/35  brain]
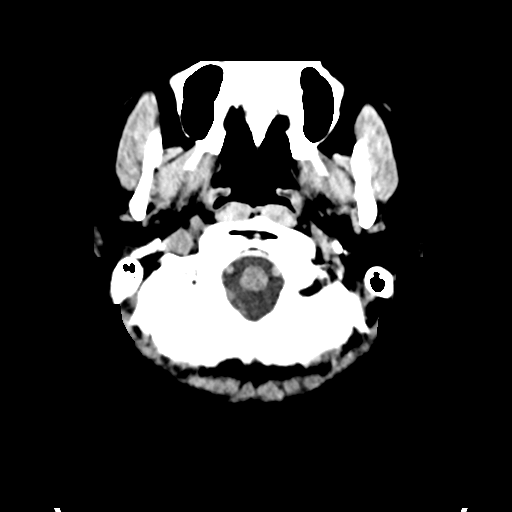
[im 5/35  bone]
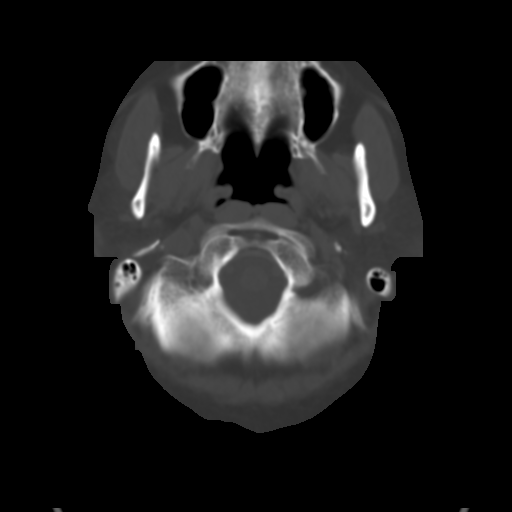
[im 9/35  brain]
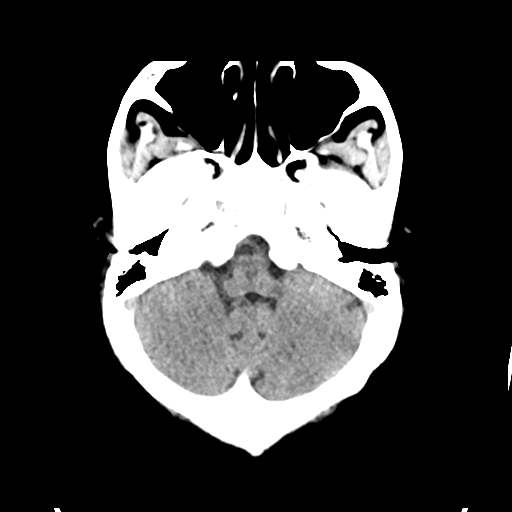
[im 13/35  brain]
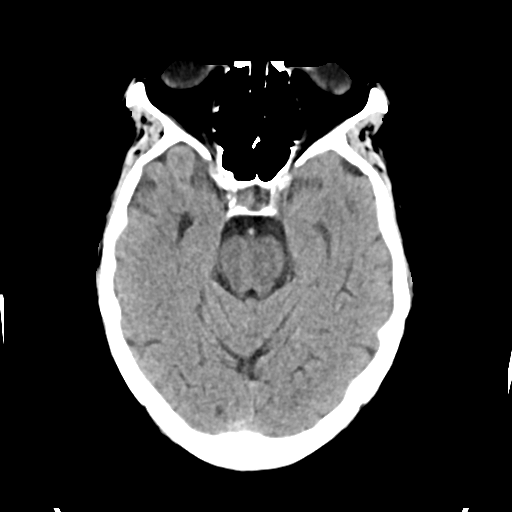
[im 18/35  brain]
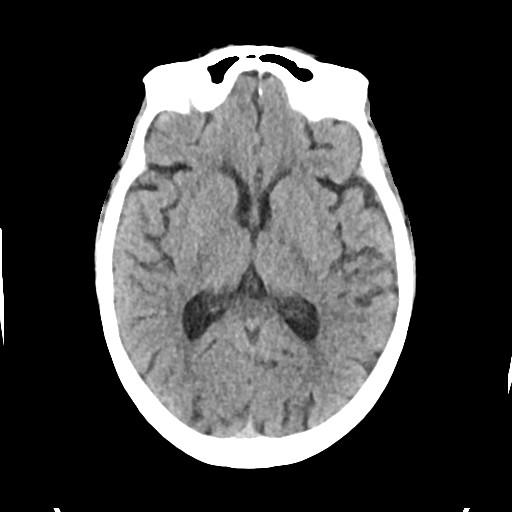
[im 22/35  brain]
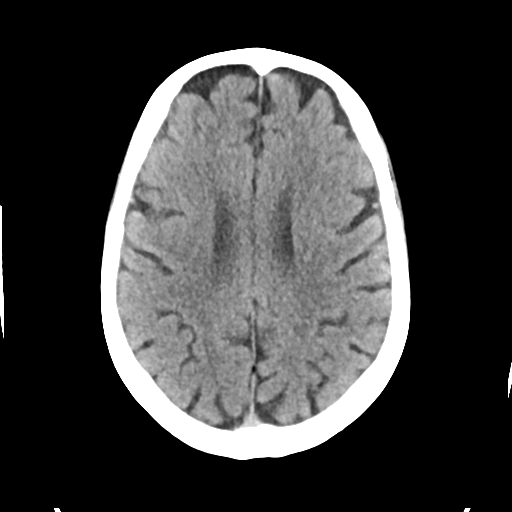
[im 22/35  bone]
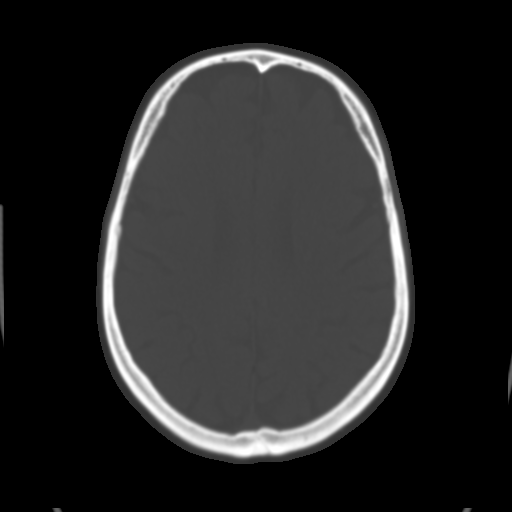
[im 26/35  brain]
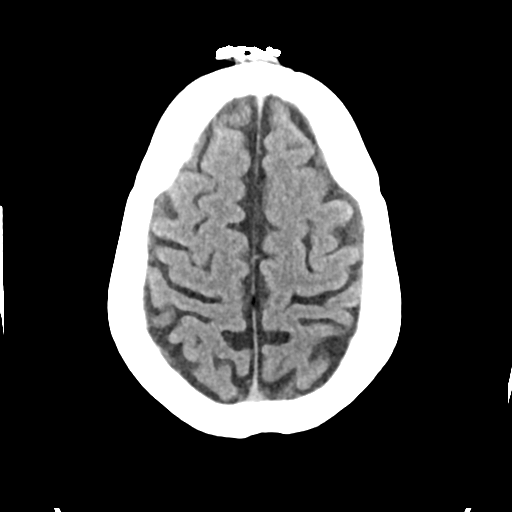
[im 30/35  brain]
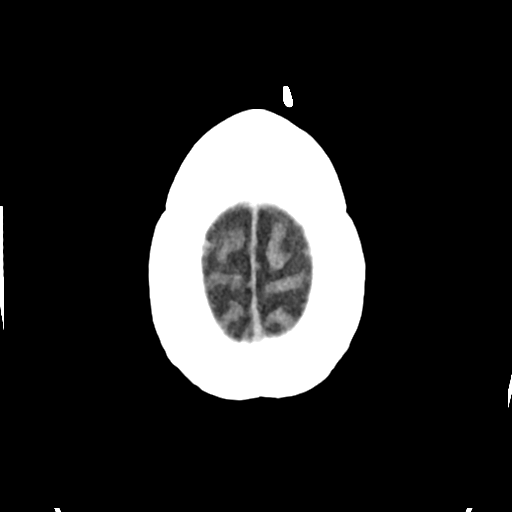

[Series 4: head bone · axial · 0.43mm/px · z∈[-112,-50]mm · 4 of 88 slices shown]
[im 9/88  bone]
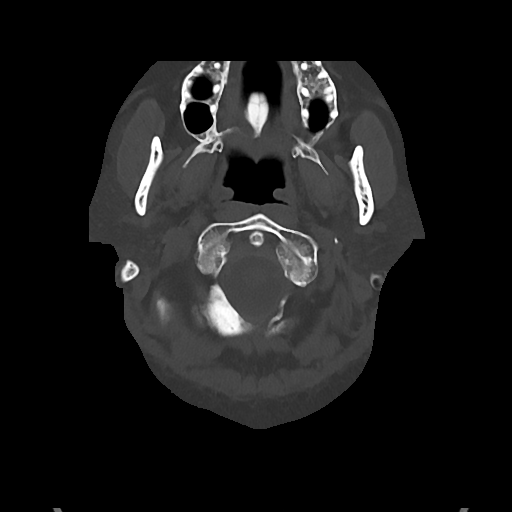
[im 18/88  bone]
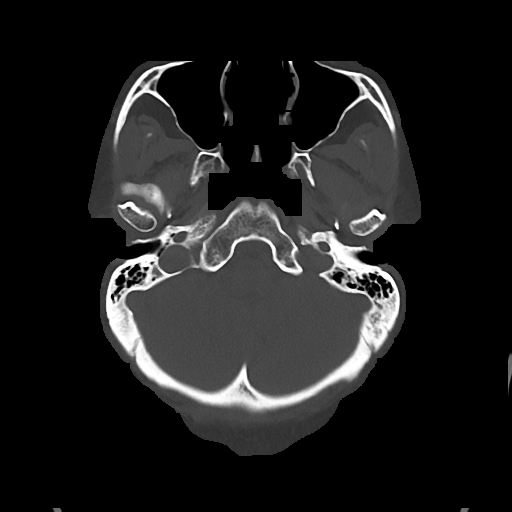
[im 27/88  bone]
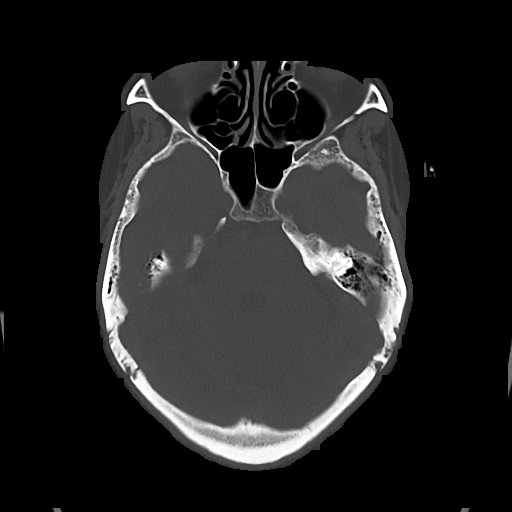
[im 40/88  bone]
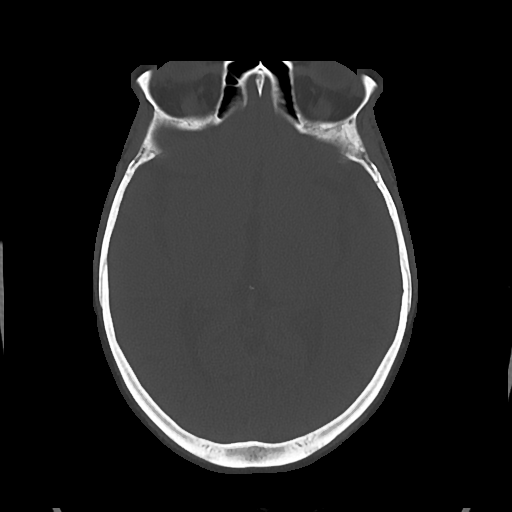

[Series 5: cor soft · coronal · 0.32mm/px · 3 of 67 slices shown]
[im 23/67  brain]
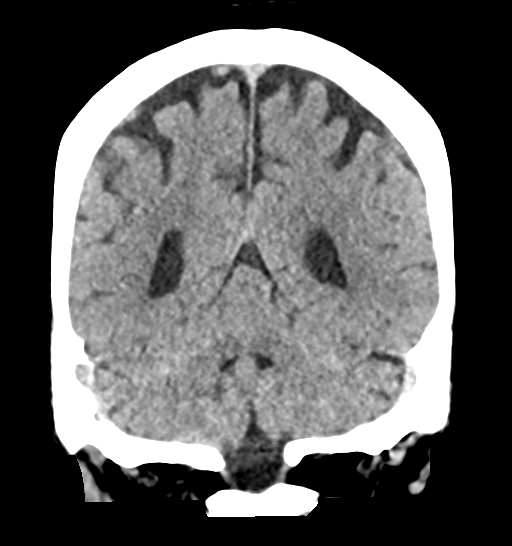
[im 30/67  brain]
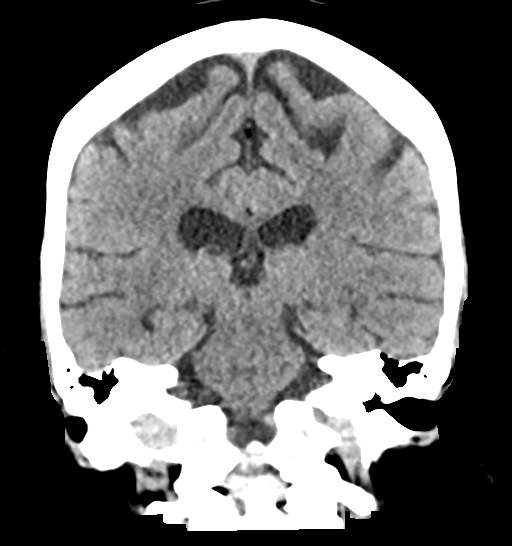
[im 37/67  brain]
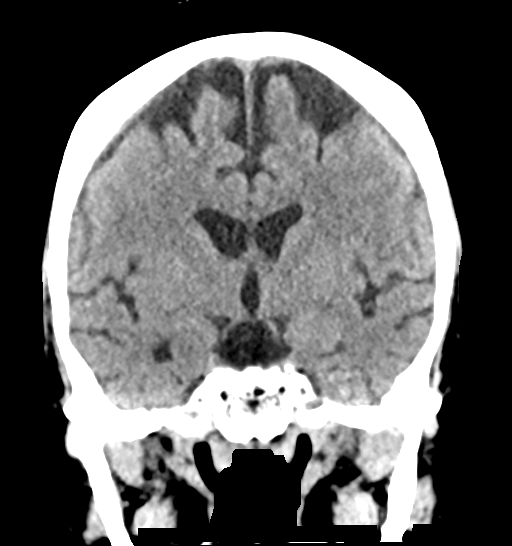

[Series 6: sag soft · sagittal · 0.34mm/px · 3 of 53 slices shown]
[im 18/53  brain]
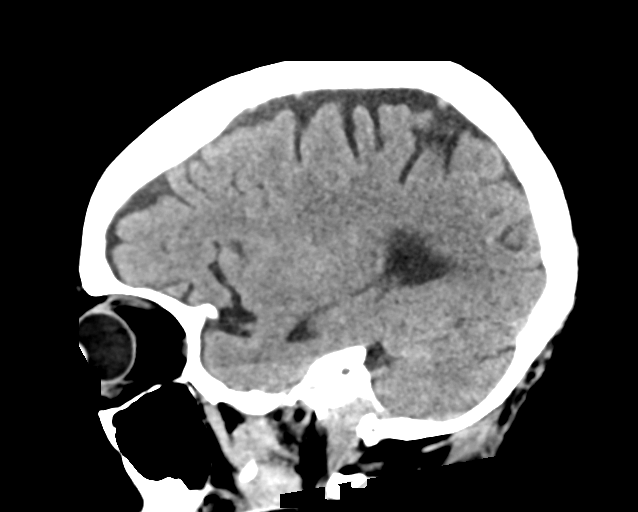
[im 27/53  brain]
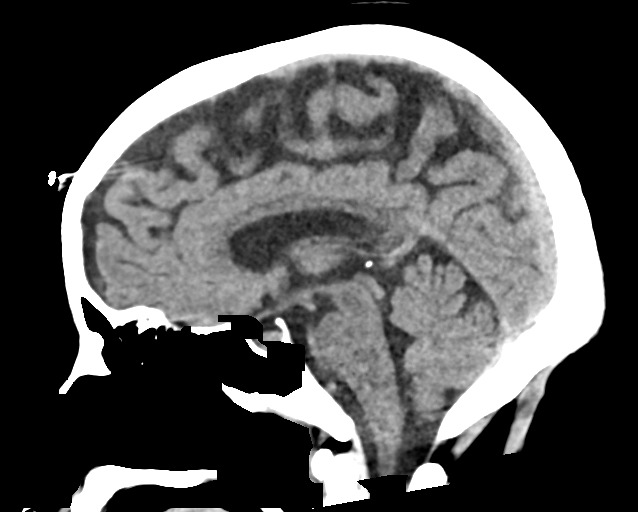
[im 35/53  brain]
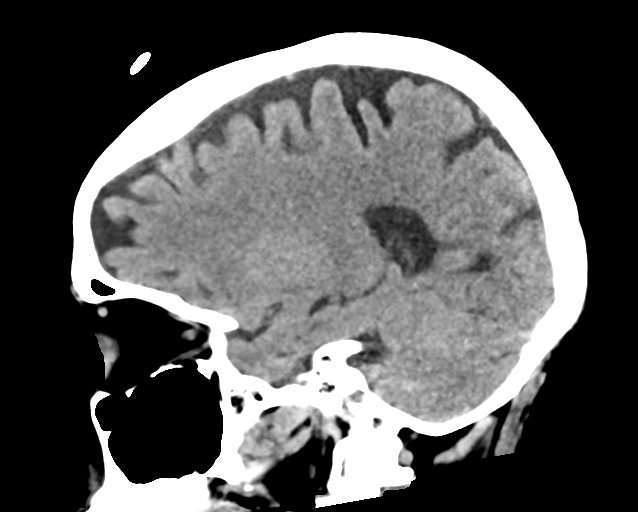

[17 of 47 positions shown; findings below may reference images not displayed]

FINDINGS: Brain: Mild cerebral atrophy. No evidence of acute infarction,
hemorrhage, hydrocephalus, extra-axial collection or mass
lesion/mass effect.

Vascular: Mild intracranial arterial vascular calcifications are
present.

Skull: Normal. Negative for fracture or focal lesion.

Sinuses/Orbits: No acute finding.

Other: None.
IMPRESSION: No acute intracranial abnormalities.  Mild chronic atrophy.

## 2019-07-21 ENCOUNTER — Ambulatory Visit (AMBULATORY_SURGERY_CENTER): Payer: Medicare Other | Admitting: Gastroenterology

## 2019-07-21 ENCOUNTER — Encounter: Payer: Self-pay | Admitting: Gastroenterology

## 2019-07-21 ENCOUNTER — Other Ambulatory Visit: Payer: Self-pay

## 2019-07-21 VITALS — BP 155/73 | HR 75 | Temp 97.5°F | Resp 14 | Ht 65.0 in | Wt 196.0 lb

## 2019-07-21 DIAGNOSIS — Z1211 Encounter for screening for malignant neoplasm of colon: Secondary | ICD-10-CM

## 2019-07-21 MED ORDER — SODIUM CHLORIDE 0.9 % IV SOLN: 500.0000 mL | Freq: Once | INTRAVENOUS | Status: DC

## 2019-07-21 NOTE — Progress Notes (Signed)
A and O x3. Report to RN. Tolerated MAC anesthesia well.

## 2019-07-21 NOTE — Op Note (Signed)
[facility] Patient Name: Sandra Shelton Procedure Date: 07/21/2019 10:19 AM MRN: [id number] Endoscopist: Milus Banister , MD Age: 64 Referring MD:  Date of Birth: [birth year] Gender: Female Account #: [id number] Procedure:                Colonoscopy Indications:              Screening for colorectal malignant neoplasm Medicines:                Monitored Anesthesia Care Procedure:                Pre-Anesthesia Assessment:                           - Prior to the procedure, a History and Physical                            was performed, and patient medications and                            allergies were reviewed. The patient's tolerance of                            previous anesthesia was also reviewed. The risks                            and benefits of the procedure and the sedation                            options and risks were discussed with the patient.                            All questions were answered, and informed consent                            was obtained. Prior Anticoagulants: The patient has                            taken Plavix (clopidogrel), last dose was 5 days                            prior to procedure. ASA Grade Assessment: III - A                            patient with severe systemic disease. After                            reviewing the risks and benefits, the patient was                            deemed in satisfactory condition to undergo the                            procedure.  After obtaining informed consent, the colonoscope                            was passed under direct vision. Throughout the                            procedure, the patient's blood pressure, pulse, and                            oxygen saturations were monitored continuously. The                            Colonoscope was introduced through the anus and                            advanced to the the cecum, identified by                             appendiceal orifice and ileocecal valve. The                            colonoscopy was performed without difficulty. The                            patient tolerated the procedure well. The quality                            of the bowel preparation was good. Scope In: 10:44:42 AM Scope Out: 10:55:49 AM Scope Withdrawal Time: 0 hours 9 minutes 17 seconds  Total Procedure Duration: 0 hours 11 minutes 7 seconds  Findings:                 The entire examined colon appeared normal on direct                            and retroflexion views. Complications:            No immediate complications. Estimated blood loss:                            None. Estimated Blood Loss:     Estimated blood loss: none. Impression:               - The entire examined colon is normal on direct and                            retroflexion views.                           - No polyps or cancers. Recommendation:           - Patient has a contact number available for                            emergencies. The signs and symptoms of potential  delayed complications were discussed with the                            patient. Return to normal activities tomorrow.                            Written discharge instructions were provided to the                            patient.                           - Resume previous diet.                           - Continue present medications. It is OK to resume                            your plavix today.                           - Repeat colonoscopy in 10 years for screening. Milus Banister, MD 07/21/2019 10:57:47 AM This report has been signed electronically.

## 2019-07-21 NOTE — Progress Notes (Signed)
VS by CW  Pt's states no medical or surgical changes since previsit or office visit.  

## 2019-07-21 NOTE — Patient Instructions (Signed)
Your next colonoscopy should occur in 10 years.  You may resume your previous diet and medication schedule.  YOU MAY RESUME YOUR PLAVIX TODAY 07/21/19.  Thank you for allowing [place] to care for you today!!!   YOU HAD AN ENDOSCOPIC PROCEDURE TODAY AT [facility]:   Refer to the procedure report that was given to you for any specific questions about what was found during the examination.  If the procedure report does not answer your questions, please call your gastroenterologist to clarify.  If you requested that your care partner not be given the details of your procedure findings, then the procedure report has been included in a sealed envelope for you to review at your convenience later.  YOU SHOULD EXPECT: Some feelings of bloating in the abdomen. Passage of more gas than usual.  Walking can help get rid of the air that was put into your GI tract during the procedure and reduce the bloating. If you had a lower endoscopy (such as a colonoscopy or flexible sigmoidoscopy) you may notice spotting of blood in your stool or on the toilet paper. If you underwent a bowel prep for your procedure, you may not have a normal bowel movement for a few days.  Please Note:  You might notice some irritation and congestion in your nose or some drainage.  This is from the oxygen used during your procedure.  There is no need for concern and it should clear up in a day or so.  SYMPTOMS TO REPORT IMMEDIATELY:   Following lower endoscopy (colonoscopy or flexible sigmoidoscopy):  Excessive amounts of blood in the stool  Significant tenderness or worsening of abdominal pains  Swelling of the abdomen that is new, acute  Fever of 100F or higher  For urgent or emergent issues, a gastroenterologist can be reached at any hour by calling [phone]. Do not use MyChart messaging for urgent concerns.    DIET:  We do recommend a small meal at first, but then you may proceed to your regular diet.  Drink  plenty of fluids but you should avoid alcoholic beverages for 24 hours.  ACTIVITY:  You should plan to take it easy for the rest of today and you should NOT DRIVE or use heavy machinery until tomorrow (because of the sedation medicines used during the test).    FOLLOW UP: Our staff will call the number listed on your records 48-72 hours following your procedure to check on you and address any questions or concerns that you may have regarding the information given to you following your procedure. If we do not reach you, we will leave a message.  We will attempt to reach you two times.  During this call, we will ask if you have developed any symptoms of COVID 19. If you develop any symptoms (ie: fever, flu-like symptoms, shortness of breath, cough etc.) before then, please call [phone].  If you test positive for Covid 19 in the 2 weeks post procedure, please call and report this information to [place].    If any biopsies were taken you will be contacted by phone or by letter within the next 1-3 weeks.  Please call us at [phone] if you have not heard about the biopsies in 3 weeks.    SIGNATURES/CONFIDENTIALITY: You and/or your care partner have signed paperwork which will be entered into your electronic medical record.  These signatures attest to the fact that that the information above on your After Visit Summary has been  reviewed and is understood.  Full responsibility of the confidentiality of this discharge information lies with you and/or your care-partner. 

## 2019-07-23 ENCOUNTER — Telehealth: Payer: Self-pay | Admitting: *Deleted

## 2019-07-23 NOTE — Telephone Encounter (Signed)
  Follow up Call-  Call back number 07/21/2019  Post procedure Call Back phone  # [phone]  Permission to leave phone message Yes  Some recent data might be hidden     Patient questions:  Do you have a fever, pain , or abdominal swelling? No. Pain Score  0 *  Have you tolerated food without any problems? Yes.    Have you been able to return to your normal activities? Yes.    Do you have any questions about your discharge instructions: Diet   No. Medications  No. Follow up visit  No.  Do you have questions or concerns about your Care? No.  Actions: * If pain score is 4 or above: No action needed, pain <4.  1. Have you developed a fever since your procedure? no  2.   Have you had an respiratory symptoms (SOB or cough) since your procedure? no  3.   Have you tested positive for COVID 19 since your procedure no  4.   Have you had any family members/close contacts diagnosed with the COVID 19 since your procedure?  no   If yes to any of these questions please route to Joylene John, RN and Erenest Rasher, RN

## 2019-07-24 MED FILL — CLOPIDOGREL 75 MG TABLET: 75 | 90 days supply | Qty: 90 | Fill #1

## 2019-07-24 MED FILL — LISINOPRIL 10 MG TABS: 10 | 90 days supply | Qty: 90 | Fill #1

## 2019-08-07 ENCOUNTER — Other Ambulatory Visit: Payer: Self-pay | Admitting: Internal Medicine

## 2019-08-07 MED FILL — METFORMIN HCL 500 MG TABS: 500 | 90 days supply | Qty: 270 | Fill #0

## 2019-08-07 MED FILL — ROSUVASTATIN CALCIUM 40 MG: 40 | 90 days supply | Qty: 90 | Fill #0

## 2019-08-07 MED FILL — ANASTROZOLE 1 MG TABLET: 1 | 30 days supply | Qty: 30 | Fill #5

## 2019-09-13 NOTE — Progress Notes (Signed)
Patient Care Team: [name], MD as PCP - General (Internal Medicine) Fanny Skates, MD as Consulting Physician (General Surgery) Nicholas Lose, MD as Consulting Physician (Hematology and Oncology) Gery Pray, MD as Consulting Physician (Radiation Oncology) Rockwell Germany, RN as Oncology Nurse Navigator Mauro Kaufmann, RN as Oncology Nurse Navigator  DIAGNOSIS:    ICD-10-CM   1. Malignant neoplasm of upper-outer quadrant of right breast in female, estrogen receptor positive ([place])  C50.411    Z17.0     SUMMARY OF ONCOLOGIC HISTORY: Oncology History  Malignant neoplasm of upper-outer quadrant of right breast in female, estrogen receptor positive ([place])  01/27/2018 Mammogram   Screening Mammogram  Possible right breast mass; asymmetry in left breast.   02/10/2018 Initial Diagnosis   Screening mammogram detected right breast mass and asymmetry in the left breast.  The left breast asymmetry resolved.  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%, HER-2 +3+ by IHC, T1BN0 stage Ia clinical stage   03/24/2018 Surgery   Lumpectomy and SLNB: IDC, 1.2cm, grade 2, margins negative, 4 SLN negative, T1cN0, previously ER/PR/HER-2 positive with Ki-67 of 20%   03/24/2018 Cancer Staging   Staging form: Breast, AJCC 8th Edition - Pathologic stage from 03/24/2018: Stage IA (pT1c, pN0, cM0, G2, ER+, PR+, HER2+) - Signed by Gardenia Phlegm, NP on 03/26/2018   04/22/2018 - 06/24/2018 Chemotherapy   The patient had trastuzumab (HERCEPTIN) 336 mg in sodium chloride 0.9 % 250 mL chemo infusion, 4 mg/kg = 336 mg, Intravenous,  Once, 3 of 16 cycles Administration: 336 mg (04/22/2018), 168 mg (04/29/2018), 168 mg (05/21/2018), 168 mg (05/06/2018), 168 mg (05/13/2018), 168 mg (05/27/2018), 168 mg (06/03/2018), 168 mg (06/10/2018), 168 mg (06/17/2018), 168 mg (06/24/2018) PACLitaxel (TAXOL) 156 mg  in sodium chloride 0.9 % 250 mL chemo infusion (</= 65m/m2), 80 mg/m2 = 156 mg, Intravenous,  Once, 3 of 3 cycles Administration: 156 mg (04/22/2018), 156 mg (04/29/2018), 156 mg (05/21/2018), 156 mg (05/06/2018), 156 mg (05/13/2018), 156 mg (05/27/2018), 156 mg (06/03/2018), 156 mg (06/10/2018), 156 mg (06/17/2018), 156 mg (06/24/2018)  for chemotherapy treatment.    07/01/2018 -  Chemotherapy   The patient had trastuzumab (HERCEPTIN) 525 mg in sodium chloride 0.9 % 250 mL chemo infusion, 6 mg/kg = 525 mg (100 % of original dose 6 mg/kg), Intravenous,  Once, 3 of 3 cycles Dose modification: 6 mg/kg (original dose 6 mg/kg, Cycle 1, Reason: Provider Judgment) Administration: 525 mg (07/01/2018), 525 mg (07/22/2018), 525 mg (08/12/2018) trastuzumab-dkst (OGIVRI) 525 mg in sodium chloride 0.9 % 250 mL chemo infusion, 6 mg/kg = 525 mg (100 % of original dose 6 mg/kg), Intravenous,  Once, 11 of 11 cycles Dose modification: 6 mg/kg (original dose 6 mg/kg, Cycle 4, Reason: Other (see comments), Comment: Biosimilar Conversion) Administration: 525 mg (09/02/2018), 525 mg (09/23/2018), 525 mg (10/14/2018), 525 mg (11/04/2018), 525 mg (11/25/2018), 525 mg (12/16/2018), 525 mg (01/06/2019), 525 mg (01/27/2019), 525 mg (02/17/2019), 525 mg (03/10/2019), 525 mg (03/31/2019)  for chemotherapy treatment.    07/29/2018 - 08/22/2018 Radiation Therapy   Adjuvant XRT   09/05/2018 -  Anti-estrogen oral therapy   Anastrozole 175mdaily, plan for 5-7 years      CHIEF COMPLIANT: Follow-up of right breast cancer on anastrozole   INTERVAL HISTORY: Sandra Shelton a [age].o. with above-mentioned history of right breast cancer who underwent a lumpectomy, adjuvant chemotherapy, radiation,  [name], and is currently on anti-estrogen therapy with anastrozole. Bone density scan on 05/12/19 showed osteopenia with a T-score of -1.0 at the left femur neck. Her port was removed by Dr. Georgette Dover on 05/19/19. She presents to the clinic todayfor  follow-up. She is tolerating anastrozole extremely well.  She has mild hot flashes.  ALLERGIES:  has No Known Allergies.  MEDICATIONS:  Current Outpatient Medications  Medication Sig Dispense Refill  . amLODipine (NORVASC) 10 MG tablet TAKE 1 TABLET (10 MG TOTAL) BY MOUTH DAILY. 30 tablet 2  . anastrozole (ARIMIDEX) 1 MG tablet Take 1 tablet (1 mg total) by mouth daily. 90 tablet 3  . Artificial Tear Ointment (DRY EYES OP) Place 1 drop into both eyes daily as needed (Dry eyes).    Marland Kitchen aspirin 81 MG tablet Take 1 tablet (81 mg total) by mouth daily. 100 tablet 2  . blood glucose meter kit and supplies KIT Dispense based on patient and insurance preference. Use up to four times daily as directed. (FOR ICD-9 250.00, 250.01). 1 each 0  . CALCIUM PO Take 15 mLs by mouth daily.    . clopidogrel (PLAVIX) 75 MG tablet TAKE 1 TABLET (75 MG TOTAL) BY MOUTH DAILY. 60 tablet 6  . Elderberry 575 MG/5ML SYRP Take 15 mLs by mouth daily as needed.     . Insulin Pen Needle (TRUEPLUS PEN NEEDLES) 32G X 4 MM MISC Use as directed to inject insulin 100 each 3  . LANTUS SOLOSTAR 100 UNIT/ML Solostar Pen Inject 7 Units into the skin 2 (two) times daily. 15 mL 6  . lisinopril (ZESTRIL) 10 MG tablet Take 1 tablet (10 mg total) by mouth daily. 90 tablet 3  . metFORMIN (GLUCOPHAGE) 500 MG tablet 2 tabs in morning and one in the evening PO (Patient taking differently: Take 1,000 mg by mouth 2 (two) times daily with a meal. ) 270 tablet 3  . rosuvastatin (CRESTOR) 40 MG tablet TAKE 1 TABLET (40 MG TOTAL) BY MOUTH DAILY. 90 tablet 0   No current facility-administered medications for this visit.    PHYSICAL EXAMINATION: ECOG PERFORMANCE STATUS: 1 - Symptomatic but completely ambulatory  Vitals:   09/14/19 1121  BP: (!) 141/68  Pulse: 97  Resp: 18  Temp: 97.9 F (36.6 C)  SpO2: 100%   Filed Weights   09/14/19 1121  Weight: 193 lb 12.8 oz (87.9 kg)    BREAST: No palpable masses or nodules in either right or  left breasts. No palpable axillary supraclavicular or infraclavicular adenopathy no breast tenderness or nipple discharge. (exam performed in the presence of a chaperone)  LABORATORY DATA:  I have reviewed the data as listed CMP Latest Ref Rng & Units 05/19/2019 03/10/2019 01/27/2019  Glucose 70 - 99 mg/dL 166(H) 347(H) 315(H)  BUN 8 - 23 mg/dL 15 16 18   Creatinine 0.44 - 1.00 mg/dL 0.74 1.04(H) 1.05(H)  Sodium 135 - 145 mmol/L 138 137 141  Potassium 3.5 - 5.1 mmol/L 3.9 4.0 4.2  Chloride 98 - 111 mmol/L 108 104 107  CO2 22 - 32 mmol/L 22 22 20(L)  Calcium 8.9 - 10.3 mg/dL 9.8 9.6 9.4  Total Protein 6.5 - 8.1 g/dL 7.7 7.6 7.5  Total Bilirubin 0.3 - 1.2 mg/dL 0.4 <0.2(L) <0.2(L)  Alkaline Phos 38 - 126 U/L 52 58 59  AST 15 - 41 U/L 18 13(L) 15  ALT 0 - 44 U/L 19 21 22     Lab Results  Component Value Date   WBC  6.1 05/19/2019   HGB 13.0 05/19/2019   HCT 40.8 05/19/2019   MCV 86.4 05/19/2019   PLT 257 05/19/2019   NEUTROABS 4.3 03/10/2019    ASSESSMENT & PLAN:  Malignant neoplasm of upper-outer quadrant of right breast in female, estrogen receptor positive ([place]) 02/04/2018:Screening mammogram detected right breast mass and asymmetry in the left breast. The left breast asymmetry resolved. 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 Adj chemo with Taxol Herceptin weekly X 11 (stopped for peripheral neuropathy)started 04/22/2018-07/01/2018,now on [name] to complete 1 full year of therapy completed March 2021 2. Adjuvant radiation therapy completed 08/22/2018 3. Adjuvant antiestrogen therapy started  09/05/2018 ------------------------------------------------------------------------------------------------------------------------ Current treatment: Anastrozole 1 mg daily started 09/05/2018 Anastrozole toxicities: Mild hot flashes and muscle stiffness but otherwise doing quite well  Breast cancer surveillance: 1.  Breast exam 09/14/2019: Benign 2. Mammograms 02/22/2019: Benign breast density category C 3.  Bone density 05/12/2019: T score -1: Normal  Return to clinic in 1 year for follow-up    No orders of the defined types were placed in this encounter.  The patient has a good understanding of the overall plan. she agrees with it. she will call with any problems that may develop before the next visit here.  Total time spent: 20 mins including face to face time and time spent for planning, charting and coordination of care  Nicholas Lose, MD 09/14/2019  I, Cloyde Reams Dorshimer, am acting as scribe for Dr. Nicholas Lose.  I have reviewed the above documentation for accuracy and completeness, and I agree with the above.

## 2019-09-14 ENCOUNTER — Other Ambulatory Visit: Payer: Self-pay | Admitting: Hematology and Oncology

## 2019-09-14 ENCOUNTER — Ambulatory Visit: Payer: Medicare Other | Admitting: Hematology and Oncology

## 2019-09-14 ENCOUNTER — Inpatient Hospital Stay: Payer: Medicare Other | Attending: Hematology and Oncology | Admitting: Hematology and Oncology

## 2019-09-14 ENCOUNTER — Other Ambulatory Visit: Payer: Self-pay

## 2019-09-14 DIAGNOSIS — Z79899 Other long term (current) drug therapy: Secondary | ICD-10-CM | POA: Diagnosis not present

## 2019-09-14 DIAGNOSIS — Z170 Estrogen receptor positive status [ER+]: Secondary | ICD-10-CM | POA: Diagnosis not present

## 2019-09-14 DIAGNOSIS — C50411 Malignant neoplasm of upper-outer quadrant of right female breast: Secondary | ICD-10-CM | POA: Diagnosis present

## 2019-09-14 DIAGNOSIS — Z7982 Long term (current) use of aspirin: Secondary | ICD-10-CM | POA: Diagnosis not present

## 2019-09-14 DIAGNOSIS — M85852 Other specified disorders of bone density and structure, left thigh: Secondary | ICD-10-CM | POA: Diagnosis not present

## 2019-09-14 DIAGNOSIS — Z79811 Long term (current) use of aromatase inhibitors: Secondary | ICD-10-CM | POA: Diagnosis not present

## 2019-09-14 MED ORDER — ANASTROZOLE 1 MG PO TABS: 1.0000 mg | ORAL_TABLET | Freq: Every day | ORAL | 3 refills | Status: DC

## 2019-09-14 MED FILL — ANASTROZOLE 1 MG TABLET: 1 | 90 days supply | Qty: 90 | Fill #0

## 2019-09-14 NOTE — Assessment & Plan Note (Signed)
02/04/2018:Screening mammogram detected right breast mass and asymmetry in the left breast. The left breast asymmetry resolved. 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 Adj chemo with Taxol Herceptin weekly X 11 (stopped for peripheral neuropathy)started 04/22/2018-07/01/2018,now on [name] to complete 1 full year of therapy completed March 2021 2. Adjuvant radiation therapy completed 08/22/2018 3. Adjuvant antiestrogen therapy started 09/05/2018 ------------------------------------------------------------------------------------------------------------------------ Current treatment: Anastrozole 1 mg daily started 09/05/2018 Anastrozole toxicities:  Breast cancer surveillance: 1.  Breast exam 09/14/2019: Benign 2. Mammograms 02/22/2019: Benign breast density category C 3.  Bone density 05/12/2019: T score -1: Normal  Return to clinic in 1 year for follow-up

## 2019-09-15 ENCOUNTER — Other Ambulatory Visit: Payer: Self-pay | Admitting: Internal Medicine

## 2019-09-15 DIAGNOSIS — E1159 Type 2 diabetes mellitus with other circulatory complications: Secondary | ICD-10-CM

## 2019-09-15 MED FILL — LANTUS SOLOSTAR 100 UNITS/M: 100 | 104 days supply | Qty: 15 | Fill #0

## 2019-09-15 NOTE — Telephone Encounter (Signed)
Requested Prescriptions  Pending Prescriptions Disp Refills   LANTUS SOLOSTAR 100 UNIT/ML Solostar Pen [Pharmacy Med Name: LANTUS SOLOSTAR 100 UNITS/M 100 Solution Pen-injector] 15 mL 6    Sig: INJECT 7 UNITS INTO THE SKIN 2 (TWO) TIMES DAILY.     Endocrinology:  Diabetes - Insulins Passed - 09/15/2019 10:14 AM      Passed - HBA1C is between 0 and 7.9 and within 180 days    HbA1c, POC (controlled diabetic range)  Date Value Ref Range Status  04/21/2019 7.9 (A) 0.0 - 7.0 % Final         Passed - Valid encounter within last 6 months    Recent Outpatient Visits          4 months ago Type 2 diabetes mellitus with other circulatory complication, with long-term current use of insulin ([place])   [place], Neoma Laming B, MD   1 year ago Controlled type 2 diabetes mellitus with other circulatory complication, with long-term current use of insulin ([place])   [place], Deborah B, MD   1 year ago Preoperative evaluation to rule out surgical contraindication   [place], Deborah B, MD   1 year ago Pap smear for cervical cancer screening   [place], Deborah B, MD   1 year ago Controlled type 2 diabetes mellitus with other circulatory complication, with long-term current use of insulin [facility])   [place], Deborah B, MD      Future Appointments            In 1 month Wynetta Emery, Dalbert Batman, MD [place]

## 2019-10-05 ENCOUNTER — Other Ambulatory Visit: Payer: Self-pay | Admitting: Internal Medicine

## 2019-10-05 DIAGNOSIS — I10 Essential (primary) hypertension: Secondary | ICD-10-CM

## 2019-10-05 MED FILL — LISINOPRIL 10 MG TABS: 10 | 90 days supply | Qty: 90 | Fill #2

## 2019-10-05 MED FILL — AMLODIPINE BESYLATE 10 MG T: 10 | 30 days supply | Qty: 30 | Fill #0

## 2019-10-14 ENCOUNTER — Other Ambulatory Visit: Payer: Self-pay

## 2019-10-14 DIAGNOSIS — I6522 Occlusion and stenosis of left carotid artery: Secondary | ICD-10-CM

## 2019-10-22 ENCOUNTER — Other Ambulatory Visit: Payer: Self-pay

## 2019-10-22 ENCOUNTER — Ambulatory Visit: Payer: Medicare Other | Attending: Internal Medicine | Admitting: Internal Medicine

## 2019-10-22 ENCOUNTER — Other Ambulatory Visit: Payer: Self-pay | Admitting: Internal Medicine

## 2019-10-22 DIAGNOSIS — Z794 Long term (current) use of insulin: Secondary | ICD-10-CM

## 2019-10-22 DIAGNOSIS — Z853 Personal history of malignant neoplasm of breast: Secondary | ICD-10-CM

## 2019-10-22 DIAGNOSIS — I779 Disorder of arteries and arterioles, unspecified: Secondary | ICD-10-CM

## 2019-10-22 DIAGNOSIS — I10 Essential (primary) hypertension: Secondary | ICD-10-CM

## 2019-10-22 DIAGNOSIS — E1159 Type 2 diabetes mellitus with other circulatory complications: Secondary | ICD-10-CM

## 2019-10-22 DIAGNOSIS — Z8673 Personal history of transient ischemic attack (TIA), and cerebral infarction without residual deficits: Secondary | ICD-10-CM

## 2019-10-22 DIAGNOSIS — Z23 Encounter for immunization: Secondary | ICD-10-CM

## 2019-10-22 MED ORDER — CLOPIDOGREL BISULFATE 75 MG PO TABS: 75.0000 mg | ORAL_TABLET | Freq: Every day | ORAL | 6 refills | Status: DC

## 2019-10-22 MED ORDER — LANTUS SOLOSTAR 100 UNIT/ML ~~LOC~~ SOPN: 7.0000 [IU] | PEN_INJECTOR | Freq: Two times a day (BID) | SUBCUTANEOUS | 6 refills | Status: DC

## 2019-10-22 MED ORDER — METFORMIN HCL 500 MG PO TABS: ORAL_TABLET | ORAL | 3 refills | Status: DC

## 2019-10-22 MED ORDER — ROSUVASTATIN CALCIUM 40 MG PO TABS: 40.0000 mg | ORAL_TABLET | Freq: Every day | ORAL | 2 refills | Status: DC

## 2019-10-22 MED ORDER — AMLODIPINE BESYLATE 10 MG PO TABS: 10.0000 mg | ORAL_TABLET | Freq: Every day | ORAL | 6 refills | Status: DC

## 2019-10-22 MED ORDER — LISINOPRIL 10 MG PO TABS
10.0000 mg | ORAL_TABLET | Freq: Every day | ORAL | 3 refills | Status: DC
  Filled 2020-04-26: qty 90, 90d supply, fill #0
  Filled 2020-07-21: qty 90, 90d supply, fill #1
  Filled 2020-10-03: qty 90, 90d supply, fill #2

## 2019-10-22 NOTE — Progress Notes (Signed)
Pt states her blood sugar is 113

## 2019-10-22 NOTE — Progress Notes (Signed)
Virtual Visit via Telephone Note Due to current restrictions/limitations of in-office visits due to the COVID-19 pandemic, this scheduled clinical appointment was converted to a telehealth visit  I connected with Sandra Shelton on 10/22/19 at 2:40 p.m by telephone and verified that I am speaking with the correct person using two identifiers. I am in my office.  The patient is at home.  Only the patient and myself participated in this encounter.  I discussed the limitations, risks, security and privacy concerns of performing an evaluation and management service by telephone and the availability of in person appointments. I also discussed with the patient that there may be a patient responsible charge related to this service. The patient expressed understanding and agreed to proceed.   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 last seen 04/2019.  Purpose of today's visit is chronic disease management.  Breast CA: completed treatment.  Will be on Arimidex for 5-7 yrs.  DIABETES TYPE 2 Last A1C:   Lab Results  Component Value Date   HGBA1C 7.9 (A) 04/21/2019  Med Adherence:  [x] Yes still on Lantus 7 units twice a day and Metformin. Medication side effects:  [] Yes    [x] No Home Monitoring?  [x] Yes BID - before BF and dinner Home glucose results range: this a.m was 133. This afternoon it is 115.  A.m range 140-150 Diet Adherence: [x] Yes    [] No Exercise: [x] Yes  -walks for 40-60 mins Q 3 days Hypoglycemic episodes?: [] Yes    [x] No Numbness of the feet? [] Yes    [x] No - numbness resolved post chem Retinopathy hx? [] Yes    [x] No Last eye exam:  Had eye exam done by Dr. Julio Sicks on 10/12/2019.  No retinopathy Comments: request podiatry for DM foot care  HYPERTENSION Currently taking: see medication list Med  Adherence: [x] Yes    [] No Medication side effects: [] Yes    [x] No Adherence with salt restriction: [x] Yes    [] No Home Monitoring?: [x] Yes but not in a while Monitoring Frequency: [] Yes    [] No Home BP results range:   SBP 138-140/ SOB? [] Yes    [x] No Chest Pain?: [] Yes    [x] No Leg swelling?: [] Yes    [x] No Headaches?: [] Yes    [x] No Dizziness? [] Yes    [x] No Comments:   CVA/history of CAS: She has a follow-up appointment coming up with the vascular surgeon who did her TCAR  HM:  Agree to flu shot and Prevnar  Needs RF on meds from our pharmacy Outpatient Encounter Medications as of 10/22/2019  Medication Sig  . amLODipine (NORVASC) 10 MG tablet TAKE 1 TABLET (10 MG TOTAL) BY MOUTH DAILY.  Marland Kitchen anastrozole (ARIMIDEX) 1 MG tablet Take 1 tablet (1 mg total) by mouth daily.  . Artificial Tear Ointment (DRY EYES OP) Place 1 drop into both eyes daily as needed (Dry eyes).  Marland Kitchen aspirin 81 MG tablet Take 1 tablet (81 mg total) by mouth daily.  . blood glucose meter kit and supplies KIT Dispense based on patient and insurance preference. Use up to four times daily as directed. (FOR ICD-9 250.00, 250.01).  . CALCIUM PO Take 15 mLs by mouth daily.  . clopidogrel (PLAVIX) 75 MG tablet TAKE 1  TABLET (75 MG TOTAL) BY MOUTH DAILY.  Marland Kitchen Elderberry 575 MG/5ML SYRP Take 15 mLs by mouth daily as needed.   . Insulin Pen Needle (TRUEPLUS PEN NEEDLES) 32G X 4 MM MISC Use as directed to inject insulin  . LANTUS SOLOSTAR 100 UNIT/ML Solostar Pen INJECT 7 UNITS INTO THE SKIN 2 (TWO) TIMES DAILY.  Marland Kitchen lisinopril (ZESTRIL) 10 MG tablet Take 1 tablet (10 mg total) by mouth daily.  . metFORMIN (GLUCOPHAGE) 500 MG tablet 2 tabs in morning and one in the evening PO (Patient taking differently: Take 1,000 mg by mouth 2 (two) times daily with a meal. )  . rosuvastatin (CRESTOR) 40 MG tablet TAKE 1 TABLET (40 MG TOTAL) BY MOUTH DAILY.   No facility-administered encounter medications on file as of  10/22/2019.      Observations/Objective:   Chemistry      Component Value Date/Time   NA 138 05/19/2019 0730   NA 140 09/02/2017 1043   K 3.9 05/19/2019 0730   CL 108 05/19/2019 0730   CO2 22 05/19/2019 0730   BUN 15 05/19/2019 0730   BUN 12 09/02/2017 1043   CREATININE 0.74 05/19/2019 0730   CREATININE 1.04 (H) 03/10/2019 0845      Component Value Date/Time   CALCIUM 9.8 05/19/2019 0730   ALKPHOS 52 05/19/2019 0730   AST 18 05/19/2019 0730   AST 13 (L) 03/10/2019 0845   ALT 19 05/19/2019 0730   ALT 21 03/10/2019 0845   BILITOT 0.4 05/19/2019 0730   BILITOT <0.2 (L) 03/10/2019 0845     Lab Results  Component Value Date   CHOL 146 04/21/2019   HDL 46 04/21/2019   LDLCALC 70 04/21/2019   TRIG 181 (H) 04/21/2019   CHOLHDL 3.2 04/21/2019   Lab Results  Component Value Date   WBC 6.1 05/19/2019   HGB 13.0 05/19/2019   HCT 40.8 05/19/2019   MCV 86.4 05/19/2019   PLT 257 05/19/2019     Assessment and Plan: 1. Type 2 diabetes mellitus with other circulatory complication, with long-term current use of insulin (HCC) -Advised that blood sugar goals before meals is 90-130.  Her numbers however seem okay.  She will come to the lab to have an A1c.  Encouraged her to continue healthy eating habits and regular exercise.  Continue insulin and Metformin. - Hemoglobin A1c; Future - metFORMIN (GLUCOPHAGE) 500 MG tablet; 2 tabs in morning and one in the evening PO  Dispense: 270 tablet; Refill: 3  2. Essential hypertension Not checking blood pressure regularly even though she has a device.  Encouraged her to check blood pressure at least twice a week and bring in readings to meet with the clinical pharmacist in 1 week for blood pressure recheck.  Continue current medications - lisinopril (ZESTRIL) 10 MG tablet; Take 1 tablet (10 mg total) by mouth daily.  Dispense: 90 tablet; Refill: 3 - amLODipine (NORVASC) 10 MG tablet; Take 1 tablet (10 mg total) by mouth daily.  Dispense: 30  tablet; Refill: 6  3. History of right breast cancer She has completed active treatment.  Currently on Arimidex.  Up-to-date with mammogram.  4. Left-sided carotid artery disease, unspecified type ([place]) - clopidogrel (PLAVIX) 75 MG tablet; Take 1 tablet (75 mg total) by mouth daily.  Dispense: 60 tablet; Refill: 6  5. History of CVA (cerebrovascular accident) - clopidogrel (PLAVIX) 75 MG tablet; Take 1 tablet (75 mg total) by mouth daily.  Dispense: 60 tablet; Refill: 6  6. Need for influenza  vaccination She will get vaccine when she sees the clinical pharmacist for blood pressure check  7. Need for vaccination against Streptococcus pneumoniae using pneumococcal conjugate vaccine 13 She will get this vaccine when she sees the pharmacist for blood pressure check -  Follow Up Instructions: Follow-up with me in 6 weeks for annual Medicare wellness visit   I discussed the assessment and treatment plan with the patient. The patient was provided an opportunity to ask questions and all were answered. The patient agreed with the plan and demonstrated an understanding of the instructions.   The patient was advised to call back or seek an in-person evaluation if the symptoms worsen or if the condition fails to improve as anticipated.  I provided 14 minutes of non-face-to-face time during this encounter.   [name], MD

## 2019-10-23 MED FILL — CLOPIDOGREL 75 MG TABLET: 75 | 30 days supply | Qty: 60 | Fill #0

## 2019-10-23 MED FILL — ROSUVASTATIN CALCIUM 40 MG: 40 | 90 days supply | Qty: 90 | Fill #0

## 2019-10-28 ENCOUNTER — Ambulatory Visit (INDEPENDENT_AMBULATORY_CARE_PROVIDER_SITE_OTHER): Payer: Medicare Other | Admitting: Physician Assistant

## 2019-10-28 ENCOUNTER — Other Ambulatory Visit: Payer: Self-pay

## 2019-10-28 ENCOUNTER — Ambulatory Visit (HOSPITAL_COMMUNITY)
Admission: RE | Admit: 2019-10-28 | Discharge: 2019-10-28 | Disposition: A | Discharge status: Home / Self Care | Payer: Medicare Other | Source: Ambulatory Visit | Attending: Vascular Surgery | Admitting: Vascular Surgery

## 2019-10-28 VITALS — BP 132/77 | HR 87 | Temp 98.1°F | Resp 20 | Ht 65.0 in | Wt 191.9 lb

## 2019-10-28 DIAGNOSIS — I6522 Occlusion and stenosis of left carotid artery: Secondary | ICD-10-CM | POA: Insufficient documentation

## 2019-10-28 DIAGNOSIS — I63132 Cerebral infarction due to embolism of left carotid artery: Secondary | ICD-10-CM | POA: Diagnosis not present

## 2019-10-28 NOTE — Progress Notes (Signed)
Established Carotid Patient   History of Present Illness   Sandra Shelton is a 64 y.o. ([birth year]) female who presents to go over vascular studies related to carotid artery stenosis.  She sustained a left brain stroke and underwent left TCAR by Dr. Oneida Alar in August 2019.  She denies any diagnosis of TIA or CVA since that time.  She also denies any slurring speech or changes in vision.  She recently had a lumpectomy and completed chemotherapy for breast cancer however states since lumpectomy she has had some right arm numbness.  She continues to take aspirin, Plavix, statin daily.  She denies tobacco use.  The patient's PMH, PSH, SH, and FamHx were reviewed and are unchanged from prior visit.  Current Outpatient Medications  Medication Sig Dispense Refill  . amLODipine (NORVASC) 10 MG tablet Take 1 tablet (10 mg total) by mouth daily. 30 tablet 6  . anastrozole (ARIMIDEX) 1 MG tablet Take 1 tablet (1 mg total) by mouth daily. 90 tablet 3  . Artificial Tear Ointment (DRY EYES OP) Place 1 drop into both eyes daily as needed (Dry eyes).    Marland Kitchen aspirin 81 MG tablet Take 1 tablet (81 mg total) by mouth daily. 100 tablet 2  . blood glucose meter kit and supplies KIT Dispense based on patient and insurance preference. Use up to four times daily as directed. (FOR ICD-9 250.00, 250.01). 1 each 0  . CALCIUM PO Take 15 mLs by mouth daily.    . clopidogrel (PLAVIX) 75 MG tablet Take 1 tablet (75 mg total) by mouth daily. 60 tablet 6  . Elderberry 575 MG/5ML SYRP Take 15 mLs by mouth daily as needed.     . Insulin Pen Needle (TRUEPLUS PEN NEEDLES) 32G X 4 MM MISC Use as directed to inject insulin 100 each 3  . LANTUS SOLOSTAR 100 UNIT/ML Solostar Pen Inject 7 Units into the skin 2 (two) times daily. 15 mL 6  . lisinopril (ZESTRIL) 10 MG tablet Take 1 tablet (10 mg total) by mouth daily. 90 tablet 3  . metFORMIN (GLUCOPHAGE) 500 MG tablet 2 tabs in morning and one in the evening PO 270 tablet 3  .  rosuvastatin (CRESTOR) 40 MG tablet Take 1 tablet (40 mg total) by mouth daily. 90 tablet 2   No current facility-administered medications for this visit.    REVIEW OF SYSTEMS (negative unless checked):   Cardiac:  _0  Chest pain or chest pressure? _1  Shortness of breath upon activity? _2  Shortness of breath when lying flat? _3  Irregular heart rhythm?  Vascular:  _4  Pain in calf, thigh, or hip brought on by walking? _5  Pain in feet at night that wakes you up from your sleep? _6  Blood clot in your veins? _7  Leg swelling?  Pulmonary:  _8  Oxygen at home? _9  Productive cough? _10  Wheezing?  Neurologic:  _11  Sudden weakness in arms or legs? _12  Sudden numbness in arms or legs? _13  Sudden onset of difficult speaking or slurred speech? _14  Temporary loss of vision in one eye? _15  Problems with dizziness?  Gastrointestinal:  _16  Blood in stool? _17  Vomited blood?  Genitourinary:  _18  Burning when urinating? _19  Blood in urine?  Psychiatric:  _20  Major depression  Hematologic:  _21  Bleeding problems? _22  Problems with blood clotting?  Dermatologic:  _23  Rashes or ulcers?  Constitutional:  _24  Fever or chills?  Ear/Nose/Throat:  _25  Change in hearing? _26  Nose bleeds? _27  Sore throat?  Musculoskeletal:  _28  Back pain? _29  Joint pain? _30  Muscle  pain?   Physical Examination   Vitals:   10/28/19 1056  BP: 132/77  Pulse: 87  Resp: 20  Temp: 98.1 F (36.7 C)  TempSrc: Temporal  SpO2: 99%  Weight: 191 lb 14.4 oz (87 kg)  Height: _0  (1.651 m)   Body mass index is 31.93 kg/m.  General:  WDWN in NAD; vital signs documented above Gait: Not observed HENT: WNL, normocephalic Pulmonary: normal non-labored breathing , without Rales, rhonchi,  wheezing Cardiac: regular HR Abdomen: soft, NT, no masses Skin: without rashes Vascular Exam/Pulses: symmetrical radial pulses Extremities: without ischemic changes, without Gangrene , without cellulitis; without open wounds;    Musculoskeletal: no muscle wasting or atrophy  Neurologic: A&O X 3;  No focal weakness or paresthesias are detected; CN grossly intact Psychiatric:  The pt has Normal affect.  Non-Invasive Vascular Imaging   B Carotid Duplex:   R ICA stenosis:  1-39%  R VA:  patent and antegrade  L ICA stenosis:  1-39%  L VA:  patent and antegrade   Medical Decision Making    Sandra Shelton is a 64 y.o. female who presents for surveillance of carotid artery stenosis  -Carotid duplex demonstrates a widely patent left carotid stent; right ICA also 1 to 39% without any hemodynamically significant stenosis -Continue Plavix in addition to aspirin and statin if tolerable -Follow regularly with PCP for chronic medical conditions including insulin-dependent diabetes mellitus and hypertension -Recheck carotid duplex in 1 year   Dagoberto Ligas PA-C [facility] Office: [phone]  Clinic MD: Scot Dock

## 2019-10-30 ENCOUNTER — Other Ambulatory Visit: Payer: Self-pay

## 2019-10-30 ENCOUNTER — Encounter: Payer: Self-pay | Admitting: Pharmacist

## 2019-10-30 ENCOUNTER — Ambulatory Visit: Payer: Medicare Other | Attending: Internal Medicine | Admitting: Pharmacist

## 2019-10-30 VITALS — BP 128/74 | HR 84

## 2019-10-30 DIAGNOSIS — I10 Essential (primary) hypertension: Secondary | ICD-10-CM

## 2019-10-30 DIAGNOSIS — Z23 Encounter for immunization: Secondary | ICD-10-CM | POA: Diagnosis not present

## 2019-10-30 NOTE — Progress Notes (Signed)
   S:    PCP: Dr. Wynetta Emery  Patient arrives in good spirits. Presents to the clinic for hypertension evaluation, counseling, and management.  Patient was referred and last seen by Primary Care Provider on 10/22/2019.   PMH: T2DM, HTN, cerebral thrombosis with cerebral infarction, left sided carotid artery disease, breast cancer   Medication adherence reported.  Current BP Medications include:  Lisinopril 10 mg daily, amlodipine 10 mg daily   Dietary habits include: mostly vegetables and fish Exercise habits include: walks 1 hour/3 days a week Family / Social history:  - Fhx: DM, stroke, breast cancer  - Tobacco: never smoker - Alcohol: denies intake   O:  Vitals:   10/30/19 0907  BP: 128/74  Pulse: 84    Home BP readings:  - Brings log with her: - 142/76, 119/75, 138/74, 132/77, 123/77 - Avg: 130/75  Last 3 Office BP readings: BP Readings from Last 3 Encounters:  10/30/19 128/74  10/28/19 132/77  09/14/19 (!) 141/68   BMET    Component Value Date/Time   NA 138 05/19/2019 0730   NA 140 09/02/2017 1043   K 3.9 05/19/2019 0730   CL 108 05/19/2019 0730   CO2 22 05/19/2019 0730   GLUCOSE 166 (H) 05/19/2019 0730   BUN 15 05/19/2019 0730   BUN 12 09/02/2017 1043   CREATININE 0.74 05/19/2019 0730   CREATININE 1.04 (H) 03/10/2019 0845   CALCIUM 9.8 05/19/2019 0730   GFRNONAA >60 05/19/2019 0730   GFRNONAA 56 (L) 03/10/2019 0845   GFRAA >60 05/19/2019 0730   GFRAA >60 03/10/2019 0845    Renal function: CrCl cannot be calculated (Patient's most recent lab result is older than the maximum 21 days allowed.).  Clinical ASCVD: Yes  The ASCVD Risk score Mikey Bussing DC Jr., et al., 2013) failed to calculate for the following reasons:   The patient has a prior MI or stroke diagnosis  A/P: Hypertension longstanding currently at goal on current medications. BP Goal = < 130/80 mmHg. Medication adherence reported. Avg from home readings looks good. Encouraged patient to dose at  bedtime for better control at home.  -Continued current regimen.  -Counseled on lifestyle modifications for blood pressure control including reduced dietary sodium, increased exercise, adequate sleep. -HM: Flu and Prevnar given   Results reviewed and written information provided.   Total time in face-to-face counseling 15 minutes.   F/U Clinic Visit in 1 month.    Patient seen with: Myrtis Hopping, PharmD Candidate  [employer], PharmD, [place] [phone]

## 2019-11-02 ENCOUNTER — Other Ambulatory Visit: Payer: Self-pay

## 2019-11-02 ENCOUNTER — Ambulatory Visit: Payer: Medicare Other | Attending: Internal Medicine

## 2019-11-02 DIAGNOSIS — E1159 Type 2 diabetes mellitus with other circulatory complications: Secondary | ICD-10-CM

## 2019-11-02 DIAGNOSIS — Z794 Long term (current) use of insulin: Secondary | ICD-10-CM

## 2019-11-02 MED FILL — AMLODIPINE BESYLATE 10 MG T: 10 | 30 days supply | Qty: 30 | Fill #0

## 2019-11-03 ENCOUNTER — Other Ambulatory Visit: Payer: Self-pay | Admitting: Internal Medicine

## 2019-11-03 DIAGNOSIS — E1159 Type 2 diabetes mellitus with other circulatory complications: Secondary | ICD-10-CM

## 2019-11-03 LAB — HEMOGLOBIN A1C
Est. average glucose Bld gHb Est-mCnc: 192 mg/dL
Hgb A1c MFr Bld: 8.3 % — ABNORMAL HIGH (ref 4.8–5.6)

## 2019-11-03 MED ORDER — METFORMIN HCL 1000 MG PO TABS: 1000.0000 mg | ORAL_TABLET | Freq: Two times a day (BID) | ORAL | 2 refills | Status: DC

## 2019-11-03 NOTE — Progress Notes (Signed)
A1C is 8.3 with goal being less than 7.  I recommend increase Metformin from 1000 mg in a.m and 500 mg in p.m to 1000 mg twice a day.  I will send updated rxn to her pharmacy.  Continue trying to eat healthy and exercise regularly as discussed on recent visit.Marland Kitchen

## 2019-11-04 ENCOUNTER — Other Ambulatory Visit: Payer: Self-pay | Admitting: Pharmacist

## 2019-11-04 ENCOUNTER — Telehealth: Payer: Self-pay | Admitting: Pharmacist

## 2019-11-04 DIAGNOSIS — E1159 Type 2 diabetes mellitus with other circulatory complications: Secondary | ICD-10-CM

## 2019-11-04 MED ORDER — METFORMIN HCL 1000 MG PO TABS: 1000.0000 mg | ORAL_TABLET | Freq: Two times a day (BID) | ORAL | 2 refills | Status: DC

## 2019-11-04 MED FILL — METFORMIN HCL 1000 MG TABS: 1000 | 90 days supply | Qty: 180 | Fill #0

## 2019-11-04 NOTE — Telephone Encounter (Signed)
Called patient to schedule an appointment for the  [facility]. I was unable to reach the patient so I left a HIPAA-compliant message requesting that the patient return my call.   

## 2019-11-24 ENCOUNTER — Ambulatory Visit: Payer: Medicare Other | Attending: Internal Medicine | Admitting: Internal Medicine

## 2019-11-24 ENCOUNTER — Other Ambulatory Visit: Payer: Self-pay

## 2019-11-24 ENCOUNTER — Encounter: Payer: Self-pay | Admitting: Internal Medicine

## 2019-11-24 VITALS — BP 153/81 | HR 83 | Resp 16 | Ht 65.0 in | Wt 194.6 lb

## 2019-11-24 DIAGNOSIS — Z7189 Other specified counseling: Secondary | ICD-10-CM | POA: Diagnosis not present

## 2019-11-24 DIAGNOSIS — Z794 Long term (current) use of insulin: Secondary | ICD-10-CM

## 2019-11-24 DIAGNOSIS — I10 Essential (primary) hypertension: Secondary | ICD-10-CM

## 2019-11-24 DIAGNOSIS — E1159 Type 2 diabetes mellitus with other circulatory complications: Secondary | ICD-10-CM

## 2019-11-24 DIAGNOSIS — Z0000 Encounter for general adult medical examination without abnormal findings: Secondary | ICD-10-CM

## 2019-11-24 DIAGNOSIS — E669 Obesity, unspecified: Secondary | ICD-10-CM | POA: Diagnosis not present

## 2019-11-24 DIAGNOSIS — Z5320 Procedure and treatment not carried out because of patient's decision for unspecified reasons: Secondary | ICD-10-CM

## 2019-11-24 MED FILL — CLOPIDOGREL 75 MG TABLET: 75 | 30 days supply | Qty: 60 | Fill #1

## 2019-11-24 NOTE — Patient Instructions (Signed)
If you execute a Living Will and [place], please bring a copy for our records.   Check blood pressure twice a week.  Goal is 130/80 or lower.   Healthy Eating Following a healthy eating pattern may help you to achieve and maintain a healthy body weight, reduce the risk of chronic disease, and live a long and productive life. It is important to follow a healthy eating pattern at an appropriate calorie level for your body. Your nutritional needs should be met primarily through food by choosing a variety of nutrient-rich foods. What are tips for following this plan? Reading food labels  Read labels and choose the following: ? Reduced or low sodium. ? Juices with 100% fruit juice. ? Foods with low saturated fats and high polyunsaturated and monounsaturated fats. ? Foods with whole grains, such as whole wheat, cracked wheat, brown rice, and wild rice. ? Whole grains that are fortified with folic acid. This is recommended for women who are pregnant or who want to become pregnant.  Read labels and avoid the following: ? Foods with a lot of added sugars. These include foods that contain brown sugar, corn sweetener, corn syrup, dextrose, fructose, glucose, high-fructose corn syrup, honey, invert sugar, lactose, malt syrup, maltose, molasses, raw sugar, sucrose, trehalose, or turbinado sugar.  Do not eat more than the following amounts of added sugar per day:  6 teaspoons (25 g) for women.  9 teaspoons (38 g) for men. ? Foods that contain processed or refined starches and grains. ? Refined grain products, such as white flour, degermed cornmeal, white bread, and white rice. Shopping  Choose nutrient-rich snacks, such as vegetables, whole fruits, and nuts. Avoid high-calorie and high-sugar snacks, such as potato chips, fruit snacks, and candy.  Use oil-based dressings and spreads on foods instead of solid fats such as butter, stick margarine, or cream cheese.  Limit pre-made  sauces, mixes, and "instant" products such as flavored rice, instant noodles, and ready-made pasta.  Try more plant-protein sources, such as tofu, tempeh, black beans, edamame, lentils, nuts, and seeds.  Explore eating plans such as the Mediterranean diet or vegetarian diet. Cooking  Use oil to saut or stir-fry foods instead of solid fats such as butter, stick margarine, or lard.  Try baking, boiling, grilling, or broiling instead of frying.  Remove the fatty part of meats before cooking.  Steam vegetables in water or broth. Meal planning   At meals, imagine dividing your plate into fourths: ? One-half of your plate is fruits and vegetables. ? One-fourth of your plate is whole grains. ? One-fourth of your plate is protein, especially lean meats, poultry, eggs, tofu, beans, or nuts.  Include low-fat dairy as part of your daily diet. Lifestyle  Choose healthy options in all settings, including home, work, school, restaurants, or stores.  Prepare your food safely: ? Wash your hands after handling raw meats. ? Keep food preparation surfaces clean by regularly washing with hot, soapy water. ? Keep raw meats separate from ready-to-eat foods, such as fruits and vegetables. ? Cook seafood, meat, poultry, and eggs to the recommended internal temperature. ? Store foods at safe temperatures. In general:  Keep cold foods at 49F (4.4C) or below.  Keep hot foods at 149F (60C) or above.  Keep your freezer at [facility] (-17.8C) or below.  Foods are no longer safe to eat when they have been between the temperatures of 40-149F (4.4-60C) for more than 2 hours. What foods should I eat? Fruits  Aim to eat 2 cup-equivalents of fresh, canned (in natural juice), or frozen fruits each day. Examples of 1 cup-equivalent of fruit include 1 small apple, 8 large strawberries, 1 cup canned fruit,  cup dried fruit, or 1 cup 100% juice. Vegetables Aim to eat 2-3 cup-equivalents of fresh and frozen  vegetables each day, including different varieties and colors. Examples of 1 cup-equivalent of vegetables include 2 medium carrots, 2 cups raw, leafy greens, 1 cup chopped vegetable (raw or cooked), or 1 medium baked potato. Grains Aim to eat 6 ounce-equivalents of whole grains each day. Examples of 1 ounce-equivalent of grains include 1 slice of bread, 1 cup ready-to-eat cereal, 3 cups popcorn, or  cup cooked rice, pasta, or cereal. Meats and other proteins Aim to eat 5-6 ounce-equivalents of protein each day. Examples of 1 ounce-equivalent of protein include 1 egg, 1/2 cup nuts or seeds, or 1 tablespoon (16 g) peanut butter. A cut of meat or fish that is the size of a deck of cards is about 3-4 ounce-equivalents.  Of the protein you eat each week, try to have at least 8 ounces come from seafood. This includes salmon, trout, herring, and anchovies. Dairy Aim to eat 3 cup-equivalents of fat-free or low-fat dairy each day. Examples of 1 cup-equivalent of dairy include 1 cup (240 mL) milk, 8 ounces (250 g) yogurt, 1 ounces (44 g) natural cheese, or 1 cup (240 mL) fortified soy milk. Fats and oils  Aim for about 5 teaspoons (21 g) per day. Choose monounsaturated fats, such as canola and olive oils, avocados, peanut butter, and most nuts, or polyunsaturated fats, such as sunflower, corn, and soybean oils, walnuts, pine nuts, sesame seeds, sunflower seeds, and flaxseed. Beverages  Aim for six 8-oz glasses of water per day. Limit coffee to three to five 8-oz cups per day.  Limit caffeinated beverages that have added calories, such as soda and energy drinks.  Limit alcohol intake to no more than 1 drink a day for nonpregnant women and 2 drinks a day for men. One drink equals 12 oz of beer (355 mL), 5 oz of wine (148 mL), or 1 oz of hard liquor (44 mL). Seasoning and other foods  Avoid adding excess amounts of salt to your foods. Try flavoring foods with herbs and spices instead of salt.  Avoid  adding sugar to foods.  Try using oil-based dressings, sauces, and spreads instead of solid fats. This information is based on general [place] nutrition guidelines. For more information, visit [employer]. Exact amounts may vary based on your nutrition needs. Summary  A healthy eating plan may help you to maintain a healthy weight, reduce the risk of chronic diseases, and stay active throughout your life.  Plan your meals. Make sure you eat the right portions of a variety of nutrient-rich foods.  Try baking, boiling, grilling, or broiling instead of frying.  Choose healthy options in all settings, including home, work, school, restaurants, or stores. This information is not intended to replace advice given to you by your health care provider. Make sure you discuss any questions you have with your health care provider. Document Revised: 04/15/2017 Document Reviewed: 04/15/2017 Elsevier Patient Education  [place].

## 2019-11-24 NOTE — Progress Notes (Signed)
Subjective:   Sandra Shelton is a 64 y.o. female who presents for an Initial Medicare Annual Wellness Visit. 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   Review of Systems    CV:  BP elevated today. She did not take meds as yet this morning. She has a wrist cuff.  Home BP has been 120-130/69-75       Objective:    Today's Vitals   11/24/19 1037  BP: (!) 153/81  Pulse: 83  Resp: 16  SpO2: 95%  Weight: 194 lb 9.6 oz (88.3 kg)  Height: 5' 5"  (1.651 m)   Body mass index is 32.38 kg/m. Wt Readings from Last 3 Encounters:  11/24/19 194 lb 9.6 oz (88.3 kg)  10/28/19 191 lb 14.4 oz (87 kg)  09/14/19 193 lb 12.8 oz (87.9 kg)  General: Older African-American female in NAD. Neck: No cervical lymphadenopathy.  No neck masses appreciated.  No thyroid enlargement. Ears: Small amount of wax buildup in the right ear canal.  Both tympanic membranes are within normal limits. Chest: Clear to auscultation bilaterally. CVS: Regular rate rhythm.  No gallops or murmurs heard. Extremities no lower extremity edema.  Advanced Directives 11/24/2019 05/19/2019 01/06/2019 09/23/2018 09/02/2018 07/21/2018 04/09/2018  Does Patient Have a Medical Advance Directive? No No No No No No No  Would patient like information on creating a medical advance directive? Yes (Inpatient - patient defers creating a medical advance directive at this time - Information given) No - Patient declined Yes (ED - Information included in AVS) - - - No - Patient declined    Current Medications (verified) Outpatient Encounter Medications as of 11/24/2019  Medication Sig  . amLODipine (NORVASC) 10 MG tablet Take 1 tablet (10 mg total) by mouth daily.  Marland Kitchen anastrozole (ARIMIDEX) 1 MG tablet Take 1 tablet (1 mg total) by mouth daily.  . Artificial Tear Ointment (DRY EYES OP) Place 1 drop into both eyes  daily as needed (Dry eyes).  Marland Kitchen aspirin 81 MG tablet Take 1 tablet (81 mg total) by mouth daily.  . blood glucose meter kit and supplies KIT Dispense based on patient and insurance preference. Use up to four times daily as directed. (FOR ICD-9 250.00, 250.01).  . CALCIUM PO Take 15 mLs by mouth daily.  . clopidogrel (PLAVIX) 75 MG tablet Take 1 tablet (75 mg total) by mouth daily.  Kendall Flack 575 MG/5ML SYRP Take 15 mLs by mouth daily as needed.   . Insulin Pen Needle (TRUEPLUS PEN NEEDLES) 32G X 4 MM MISC Use as directed to inject insulin  . LANTUS SOLOSTAR 100 UNIT/ML Solostar Pen Inject 7 Units into the skin 2 (two) times daily.  Marland Kitchen lisinopril (ZESTRIL) 10 MG tablet Take 1 tablet (10 mg total) by mouth daily.  . metFORMIN (GLUCOPHAGE) 1000 MG tablet Take 1 tablet (1,000 mg total) by mouth 2 (two) times daily with a meal.  . rosuvastatin (CRESTOR) 40 MG tablet Take 1 tablet (40 mg total) by mouth daily.   No facility-administered encounter medications on file as of 11/24/2019.    Allergies (verified) Patient has no known allergies.   History: Past Medical History:  Diagnosis Date  . Breast cancer ([place])     IDC - right breast   . Diabetes mellitus without complication ([place])    type 2  . Embolic stroke involving carotid artery ([place])   . GERD (gastroesophageal reflux  disease)   . Heart murmur    no problems with it  . Hepatitis    in the 80's, took meds at the time   . HLD (hyperlipidemia)   . Hypertension   . Peripheral vascular disease ([facility])    carotid artery blockage   . Personal history of chemotherapy    completed 03/2019  . Personal history of radiation therapy    completed  . Sleep apnea    Does not use CPAP  . Stroke ([place]) 11/20/2017  . Vision abnormalities    glasses   Past Surgical History:  Procedure Laterality Date  . BREAST EXCISIONAL BIOPSY Right   . BREAST LUMPECTOMY Right 03/2018  . BREAST LUMPECTOMY WITH RADIOACTIVE SEED AND SENTINEL LYMPH NODE BIOPSY  Right 03/24/2018   Procedure: RIGHT BREAST LUMPECTOMY WITH RADIOACTIVE SEED AND RIGHT AXILLARY DEEP SENTINEL LYMPH NODE BIOPSY INJECT BLUE DYE RIGHT BREAST;  Surgeon: Fanny Skates, MD;  Location: [facility];  Service: General;  Laterality: Right;  . COLONOSCOPY    . PORT-A-CATH REMOVAL N/A 05/19/2019   Procedure: PORT REMOVAL;  Surgeon: Donnie Mesa, MD;  Location: [facility];  Service: General;  Laterality: N/A;  . PORTACATH PLACEMENT N/A 04/09/2018   Procedure: INSERTION PORT-A-CATH WITH ULTRASOUND;  Surgeon: Fanny Skates, MD;  Location: [facility];  Service: General;  Laterality: N/A;  . TRANSCAROTID ARTERY REVASCULARIZATION Left 07/24/2017   Procedure: TRANSCAROTID ARTERY REVASCULARIZATION, left;  Surgeon: Elam Dutch, MD;  Location: [facility];  Service: Vascular;  Laterality: Left;  . UTERINE FIBROID SURGERY     2005   Family History  Problem Relation Age of Onset  . Diabetes Mellitus II Sister   . Stroke Mother   . Diabetes Mellitus II Mother   . Prostate cancer Father   . Diabetes Mellitus II Brother   . Breast cancer Maternal Aunt   . Diabetes Maternal Aunt   . Colon cancer Neg Hx   . Esophageal cancer Neg Hx   . Stomach cancer Neg Hx   . Pancreatic cancer Neg Hx   . Liver disease Neg Hx    Social History   Socioeconomic History  . Marital status: Single    Spouse name: Not on file  . Number of children: Not on file  . Years of education: Not on file  . Highest education level: Not on file  Occupational History  . Not on file  Tobacco Use  . Smoking status: Never Smoker  . Smokeless tobacco: Never Used  Vaping Use  . Vaping Use: Never used  Substance and Sexual Activity  . Alcohol use: No  . Drug use: No  . Sexual activity: Not on file  Other Topics Concern  . Not on file  Social History Narrative   Lives home alone.  Single.  Education Some college.  No children.     Social Determinants of Health   Financial Resource Strain:    . Difficulty of Paying Living Expenses: Not on file  Food Insecurity:   . Worried About Charity fundraiser in the Last Year: Not on file  . Ran Out of Food in the Last Year: Not on file  Transportation Needs:   . Lack of Transportation (Medical): Not on file  . Lack of Transportation (Non-Medical): Not on file  Physical Activity:   . Days of Exercise per Week: Not on file  . Minutes of Exercise per Session: Not on file  Stress:   . Feeling of Stress :  Not on file  Social Connections:   . Frequency of Communication with Friends and Family: Not on file  . Frequency of Social Gatherings with Friends and Family: Not on file  . Attends Religious Services: Not on file  . Active Member of Clubs or Organizations: Not on file  . Attends Archivist Meetings: Not on file  . Marital Status: Not on file    Tobacco Counseling Counseling given: Not Answered Nonsmoker  Clinical Intake: Pain : No/denies pain  Diabetes: Yes CBG done?: No  Diabetic?yes Discussed on last visit 10/2019.  A1C was 8.3.  Metformin dose increased to 1 gram BID. Checking BS BF 124-130s.  164 this a.m was the highest.  In the evenings readings less than 122  Activities of Daily Living In your present state of health, do you have any difficulty performing the following activities: 11/24/2019 05/19/2019  Hearing? N -  Vision? N -  Difficulty concentrating or making decisions? N -  Walking or climbing stairs? N -  Dressing or bathing? N -  Doing errands, shopping? N N  Preparing Food and eating ? N -  Using the Toilet? N -  In the past six months, have you accidently leaked urine? N -  Do you have problems with loss of bowel control? N -  Managing your Medications? N -  Managing your Finances? N -  Housekeeping or managing your Housekeeping? N -  Some recent data might be hidden    Patient Care Team: [name], MD as PCP - General (Internal Medicine) Fanny Skates, MD as Consulting  Physician (General Surgery) Nicholas Lose, MD as Consulting Physician (Hematology and Oncology) Gery Pray, MD as Consulting Physician (Radiation Oncology) Rockwell Germany, RN as Oncology Nurse Navigator Mauro Kaufmann, RN as Oncology Nurse Navigator Dr. Julio Sicks - Optometry Dr. Scot Dock - Vascular surgery Dr. Owens Loffler: Gastroenterology  Indicate any recent Medical Services you may have received from other than [facility] providers in the past year (date may be approximate).     Assessment:   This is a routine wellness examination for Orly.  Hearing/Vision screen Whisper test was normal on both sides.  Hearing Screening   125Hz  250Hz  500Hz  1000Hz  2000Hz  3000Hz  4000Hz  6000Hz  8000Hz   Right ear:           Left ear:             Visual Acuity Screening   Right eye Left eye Both eyes  Without correction:     With correction: 20 30 20 25 20 30     Dietary issues and exercise activities discussed: Walks around her apartment complex about 3 days a wk.   Doing well with eating habits - tries to eat balanced meals    Goals    . Weight (lb) < 200 lb (90.7 kg)     I want to get healthy through losing 30-40 lbs      Depression Screen PHQ 2/9 Scores 11/24/2019 04/21/2019 02/24/2018 02/06/2018 12/09/2017 09/02/2017  PHQ - 2 Score 0 0 0 0 0 0    Fall Risk Fall Risk  11/24/2019 09/02/2017  Falls in the past year? 0 No  Number falls in past yr: 0 -  Injury with Fall? 0 -    Any stairs in or around the home? No  If so, are there any without handrails? NA Home free of loose throw rugs in walkways, pet beds, electrical cords, etc? Yes  yes Adequate lighting in your home to  reduce risk of falls? Yes    ASSISTIVE DEVICES UTILIZED TO PREVENT FALLS:  Life alert? No She does not feel she needs one at this point.   Use of a cane, walker or w/c? No  Grab bars in the bathroom? Yes yes Shower chair or bench in shower? No She does not feel she needs one at this point. Elevated toilet seat  or a handicapped toilet? No She does not feel she needs one at this point.  TIMED UP AND GO:  Was the test performed? Yes .  Length of time to ambulate 10 feet: 8sec.   Gait steady and fast without use of assistive device  Cognitive Function: MMSE - Mini Mental State Exam 11/24/2019  Orientation to time 5  Orientation to Place 5  Registration 3  Attention/ Calculation 5  Recall 3  Language- name 2 objects 2  Language- repeat 1  Language- follow 3 step command 3  Language- read & follow direction 1  Write a sentence 1  Copy design 1  Total score 30        Immunizations Immunization History  Administered Date(s) Administered  . Fluad Quad(high Dose 65+) 11/25/2018  . Influenza,inj,Quad PF,6+ Mos 10/30/2019  . Moderna SARS-COVID-2 Vaccination 03/25/2019  . Pneumococcal Conjugate-13 10/30/2019  . Pneumococcal Polysaccharide-23 09/02/2017    TDAP status: Up to date Flu Vaccine status: Up to date Pneumococcal vaccine status: Up to date Covid-19 vaccine status: Completed vaccines  Qualifies for Shingles Vaccine? Yes   Zostavax completed No   Shingrix Completed?: No.    Education has been provided regarding the importance of this vaccine. Patient has been advised to call insurance company to determine out of pocket expense if they have not yet received this vaccine. Advised may also receive vaccine at local pharmacy or Health Dept. Verbalized acceptance and understanding.  Patient wants to think about getting the shingles vaccine.  She will let me know on next visit.  Screening Tests Health Maintenance  Topic Date Due  . Hepatitis C Screening  Never done  . COVID-19 Vaccine (2 - Moderna 2-dose series) 04/22/2019  . FOOT EXAM  04/20/2020  . HEMOGLOBIN A1C  05/02/2020  . OPHTHALMOLOGY EXAM  10/11/2020  . MAMMOGRAM  02/24/2021  . PNA vac Low Risk Adult (2 of 2 - PPSV23) 09/03/2022  . TETANUS/TDAP  09/03/2027  . COLONOSCOPY  07/20/2029  . INFLUENZA VACCINE  Completed  .  DEXA SCAN  Completed    Health Maintenance  Health Maintenance Due  Topic Date Due  . Hepatitis C Screening  Never done  . COVID-19 Vaccine (2 - Moderna 2-dose series) 04/22/2019    Colorectal cancer screening: Completed 07/21/2019. Repeat every 10 years Mammogram status: Completed 02/2019. Repeat every year Bone Density status: Completed 05/12/2019. Results reflect: Bone density results: NORMAL. Repeat every 3-5 years.  Lung Cancer Screening: (Low Dose CT Chest recommended if Age 11-80 years, 30 pack-year currently smoking OR have quit w/in 15years.) does not qualify.   Lung Cancer Screening Referral: NA  Additional Screening:  Hepatitis C Screening: does qualify; patient declines having it done today.  Vision Screening: Recommended annual ophthalmology exams for early detection of glaucoma and other disorders of the eye. Is the patient up to date with their annual eye exam?  Yes  Who is the provider or what is the name of the office in which the patient attends annual eye exams? Dr. Julio Sicks If pt is not established with a provider, would they like to be  referred to a provider to establish care? NA.   Dental Screening: Recommended annual dental exams for proper oral hygiene  Community Resource Referral / Chronic Care Management: CRR required this visit?  No   CCM required this visit?  No      Plan:    1. Encounter for Medicare annual wellness exam Patient declines hepatitis C screening today. We discussed the shingles vaccine.  She wants to think about it further and will let me know on next visit whether she would like to have the vaccine administered. She is independent in her ADLs and needs no assistance at home at this time  2. Advance directive discussed with patient Discussed living will and healthcare power of attorney with the patient.  She thinks she may execute 1.  Packet given to the patient explaining what is an advanced directive and also containing forms to  allow her to execute a living will and healthcare power of attorney.  If she does so, I have asked that she bring a copy of the executed document with her on next visit for our records.  3. Obesity (BMI 30-39.9) Encourage her to continue healthy eating habits and regular exercise.  4. Essential hypertension Not at goal.  Patient has not taken medicines as yet for today.  She will take them as soon as she returns home.  5. Type 2 diabetes mellitus with other circulatory complication, with long-term current use of insulin ([place]) Reported blood sugars are at goal.  She will continue Metformin  6. Screening for hepatitis C declined Discussed recommendation of screening at least once in adults.  Patient declined screening at this time.  I have personally reviewed and noted the following in the patient's chart:   . Medical and social history . Use of alcohol, tobacco or illicit drugs  . Current medications and supplements . Functional ability and status . Nutritional status . Physical activity . Advanced directives . List of other physicians . Hospitalizations, surgeries, and ER visits in previous 12 months . Vitals . Screenings to include cognitive, depression, and falls . Referrals and appointments  In addition, I have reviewed and discussed with patient certain preventive protocols, quality metrics, and best practice recommendations. A written personalized care plan for preventive services as well as general preventive health recommendations were provided to patient.     [name], MD   11/24/2019

## 2019-11-25 MED FILL — AMLODIPINE BESYLATE 10 MG T: 10 | 30 days supply | Qty: 30 | Fill #1

## 2020-01-21 MED FILL — LANTUS SOLOSTAR 100 UNITS/M: 100 | 21 days supply | Qty: 3 | Fill #0

## 2020-01-21 MED FILL — LISINOPRIL 10 MG TABS: 10 | 90 days supply | Qty: 90 | Fill #3

## 2020-01-21 MED FILL — METFORMIN HCL 1000 MG TABS: 1000 | 90 days supply | Qty: 180 | Fill #1

## 2020-02-04 MED FILL — ROSUVASTATIN CALCIUM 40 MG: 40 | 90 days supply | Qty: 90 | Fill #1

## 2020-02-05 MED FILL — ANASTROZOLE 1 MG TABLET: 1 | 90 days supply | Qty: 90 | Fill #0

## 2020-02-08 MED FILL — LANTUS SOLOSTAR 100 UNITS/M: 100 | 21 days supply | Qty: 3 | Fill #1

## 2020-02-22 ENCOUNTER — Other Ambulatory Visit: Payer: Self-pay | Admitting: Internal Medicine

## 2020-02-22 DIAGNOSIS — Z9889 Other specified postprocedural states: Secondary | ICD-10-CM

## 2020-02-22 DIAGNOSIS — Z853 Personal history of malignant neoplasm of breast: Secondary | ICD-10-CM

## 2020-02-26 MED FILL — CLOPIDOGREL 75 MG TABLET: 75 | 30 days supply | Qty: 60 | Fill #2

## 2020-02-26 MED FILL — LANTUS SOLOSTAR 100 UNITS/M: 100 | 21 days supply | Qty: 3 | Fill #2

## 2020-03-11 ENCOUNTER — Other Ambulatory Visit: Payer: Self-pay | Admitting: Pharmacist

## 2020-03-11 DIAGNOSIS — I779 Disorder of arteries and arterioles, unspecified: Secondary | ICD-10-CM

## 2020-03-11 DIAGNOSIS — Z8673 Personal history of transient ischemic attack (TIA), and cerebral infarction without residual deficits: Secondary | ICD-10-CM

## 2020-03-11 MED ORDER — CLOPIDOGREL BISULFATE 75 MG PO TABS: 75.0000 mg | ORAL_TABLET | Freq: Every day | ORAL | 6 refills | Status: DC

## 2020-03-14 MED FILL — LANTUS SOLOSTAR 100 UNITS/M: 100 | 21 days supply | Qty: 3 | Fill #3

## 2020-03-21 ENCOUNTER — Other Ambulatory Visit: Payer: Self-pay

## 2020-03-21 ENCOUNTER — Other Ambulatory Visit: Payer: Self-pay | Admitting: Internal Medicine

## 2020-03-21 ENCOUNTER — Ambulatory Visit: Payer: Medicare Other | Attending: Internal Medicine | Admitting: Internal Medicine

## 2020-03-21 DIAGNOSIS — E1159 Type 2 diabetes mellitus with other circulatory complications: Secondary | ICD-10-CM

## 2020-03-21 DIAGNOSIS — Z853 Personal history of malignant neoplasm of breast: Secondary | ICD-10-CM

## 2020-03-21 DIAGNOSIS — I10 Essential (primary) hypertension: Secondary | ICD-10-CM

## 2020-03-21 DIAGNOSIS — Z794 Long term (current) use of insulin: Secondary | ICD-10-CM | POA: Diagnosis not present

## 2020-03-21 MED ORDER — LANTUS SOLOSTAR 100 UNIT/ML ~~LOC~~ SOPN: 7.0000 [IU] | PEN_INJECTOR | Freq: Two times a day (BID) | SUBCUTANEOUS | 6 refills | Status: DC

## 2020-03-21 MED ORDER — METFORMIN HCL 1000 MG PO TABS: 1000.0000 mg | ORAL_TABLET | Freq: Two times a day (BID) | ORAL | 2 refills | Status: DC

## 2020-03-21 MED ORDER — AMLODIPINE BESYLATE 10 MG PO TABS: 10.0000 mg | ORAL_TABLET | Freq: Every day | ORAL | 3 refills | Status: DC

## 2020-03-21 MED ORDER — ROSUVASTATIN CALCIUM 40 MG PO TABS
40.0000 mg | ORAL_TABLET | Freq: Every day | ORAL | 2 refills | Status: DC
Start: 2020-03-21 — End: 2020-03-21

## 2020-03-21 MED FILL — AMLODIPINE BESYLATE 10 MG T: 10 | 90 days supply | Qty: 90 | Fill #0

## 2020-03-21 NOTE — Progress Notes (Addendum)
Virtual Visit via Telephone Note  I connected with Sandra Shelton on 03/21/20 at 11:46 a.m by telephone and verified that I am speaking with the correct person using two identifiers.  Location: Patient: home Provider: office Only the patient and myself participated in this encounter. I discussed the limitations, risks, security and privacy concerns of performing an evaluation and management service by telephone and the availability of in person appointments. I also discussed with the patient that there may be a patient responsible charge related to this service. The patient expressed understanding and agreed to proceed.   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 last visit was 11/2019.  Purpose of today's visit is chronic disease management.  HM: Last AWV 11/2019.  Due for MMG and COVID-19 booster.  She plans to get the COVID booster  DIABETES TYPE 2 Last A1C:   Results for orders placed or performed in visit on 11/02/19  Hemoglobin A1c  Result Value Ref Range   Hgb A1c MFr Bld 8.3 (H) 4.8 - 5.6 %   Est. average glucose Bld gHb Est-mCnc 192 mg/dL    Med Adherence:  _0  Yes she is on Metformin and Lantus insulin twice a day. Medication side effects:  _1  Yes    _2  No Home Monitoring?  _3  Yes - BID before meals Home glucose results range: before BF range 113-138 and 120-130 before dinner Diet Adherence: _4  Yes   Exercise: _5  Yes  -walking 3 x awk Hypoglycemic episodes?: _6  Yes    _7  No Numbness of the feet? _8  Yes    _9  No Retinopathy hx? _10  Yes    _11  No Last eye exam: no blurred vision Comments: Reports compliance with Crestor.  On Plavix and aspirin  HYPERTENSION Currently taking: see medication list.  She is on amlodipine and lisinopril. Med Adherence: _12  Yes    _13  No Medication side effects: _14  Yes    _15   No Adherence with salt restriction: _16  Yes    _17  No Home Monitoring?: _18  Yes    _19  No Monitoring Frequency: _20  Yes    _21  No Home BP results range:  Recent BP 138/77 with wrist band but she does not think it is very accurate.  She has ordered arm device and will be getting that in the mail shortly. SOB? _22  Yes    _23  No Chest Pain?: _24  Yes    _25  No Leg swelling?: _26  Yes    _27  No Headaches?: _28  Yes    _29  No Dizziness? _30  Yes    _31  No Comments:   Hx of Breast CA:  Still on Arimidex.  Will have MMG this mth  HM:  Due for COVID booster and will get Outpatient Encounter Medications as of 03/21/2020  Medication Sig  . amLODipine (NORVASC) 10 MG tablet Take 1 tablet (10 mg total) by mouth daily.  Marland Kitchen anastrozole (ARIMIDEX) 1 MG tablet Take 1 tablet (1 mg total) by mouth daily.  . Artificial Tear Ointment (DRY EYES OP) Place 1 drop into both eyes daily as needed (Dry eyes).  Marland Kitchen aspirin 81 MG tablet Take 1 tablet (81 mg total) by mouth daily.  . blood glucose meter kit and supplies KIT Dispense based on patient and insurance preference. Use up to four times daily as directed. (FOR ICD-9 250.00, 250.01).  . CALCIUM PO Take 15 mLs by mouth daily.  . clopidogrel (  PLAVIX) 75 MG tablet Take 1 tablet (75 mg total) by mouth daily.  Kendall Flack 575 MG/5ML SYRP Take 15 mLs by mouth daily as needed.   . Insulin Pen Needle (TRUEPLUS PEN NEEDLES) 32G X 4 MM MISC Use as directed to inject insulin  . LANTUS SOLOSTAR 100 UNIT/ML Solostar Pen Inject 7 Units into the skin 2 (two) times daily.  Marland Kitchen lisinopril (ZESTRIL) 10 MG tablet Take 1 tablet (10 mg total) by mouth daily.  . metFORMIN (GLUCOPHAGE) 1000 MG tablet Take 1 tablet (1,000 mg total) by mouth 2 (two) times daily with a meal.  . rosuvastatin (CRESTOR) 40 MG tablet Take 1 tablet (40 mg total) by mouth daily.   No facility-administered encounter medications on file as of 03/21/2020.      Observations/Objective: Lab Results  Component Value Date    HGBA1C 8.3 (H) 11/02/2019     Chemistry      Component Value Date/Time   NA 138 05/19/2019 0730   NA 140 09/02/2017 1043   K 3.9 05/19/2019 0730   CL 108 05/19/2019 0730   CO2 22 05/19/2019 0730   BUN 15 05/19/2019 0730   BUN 12 09/02/2017 1043   CREATININE 0.74 05/19/2019 0730   CREATININE 1.04 (H) 03/10/2019 0845      Component Value Date/Time   CALCIUM 9.8 05/19/2019 0730   ALKPHOS 52 05/19/2019 0730   AST 18 05/19/2019 0730   AST 13 (L) 03/10/2019 0845   ALT 19 05/19/2019 0730   ALT 21 03/10/2019 0845   BILITOT 0.4 05/19/2019 0730   BILITOT <0.2 (L) 03/10/2019 0845     Lab Results  Component Value Date   WBC 6.1 05/19/2019   HGB 13.0 05/19/2019   HCT 40.8 05/19/2019   MCV 86.4 05/19/2019   PLT 257 05/19/2019     Assessment and Plan: 1. Type 2 diabetes mellitus with other circulatory complication, with long-term current use of insulin ([facility]) Reported blood sugar readings are at goal.  She will come to the lab sometime this week to have blood test done including A1c.  Encouraged her to continue healthy eating habits and regular exercise.  No changes made to dose of Metformin or Lantus. - metFORMIN (GLUCOPHAGE) 1000 MG tablet; Take 1 tablet (1,000 mg total) by mouth 2 (two) times daily with a meal.  Dispense: 180 tablet; Refill: 2 - LANTUS SOLOSTAR 100 UNIT/ML Solostar Pen; Inject 7 Units into the skin 2 (two) times daily.  Dispense: 15 mL; Refill: 6 - CBC; Future - Comprehensive metabolic panel; Future - Lipid panel; Future  2. Essential hypertension Continue amlodipine and lisinopril.  Advised that blood pressure goal is 130/80 or lower. - amLODipine (NORVASC) 10 MG tablet; Take 1 tablet (10 mg total) by mouth daily.  Dispense: 90 tablet; Refill: 3  3. History of right breast cancer She is on Arimidex.  She will have her mammogram done later this month.   Follow Up Instructions: 4 mths   I discussed the assessment and treatment plan with the patient. The  patient was provided an opportunity to ask questions and all were answered. The patient agreed with the plan and demonstrated an understanding of the instructions.   The patient was advised to call back or seek an in-person evaluation if the symptoms worsen or if the condition fails to improve as anticipated.  I provided 11 minutes of non-face-to-face time during this encounter.   [name], MD

## 2020-03-21 NOTE — Addendum Note (Signed)
Addended by: [name] B on: 03/21/2020 12:02 PM   Modules accepted: Orders

## 2020-04-01 ENCOUNTER — Other Ambulatory Visit: Payer: Self-pay

## 2020-04-01 ENCOUNTER — Ambulatory Visit: Payer: Medicare Other | Attending: Internal Medicine

## 2020-04-01 DIAGNOSIS — Z794 Long term (current) use of insulin: Secondary | ICD-10-CM | POA: Diagnosis not present

## 2020-04-01 DIAGNOSIS — E1159 Type 2 diabetes mellitus with other circulatory complications: Secondary | ICD-10-CM | POA: Diagnosis not present

## 2020-04-02 LAB — COMPREHENSIVE METABOLIC PANEL
ALT: 16 IU/L (ref 0–32)
AST: 15 IU/L (ref 0–40)
Albumin/Globulin Ratio: 1.6 (ref 1.2–2.2)
Albumin: 4.8 g/dL (ref 3.8–4.8)
Alkaline Phosphatase: 66 IU/L (ref 44–121)
BUN/Creatinine Ratio: 17 (ref 12–28)
BUN: 13 mg/dL (ref 8–27)
Bilirubin Total: <0.2 mg/dL (ref 0.0–1.2)
CO2: 19 mmol/L — ABNORMAL LOW (ref 20–29)
Calcium: 10.4 mg/dL — ABNORMAL HIGH (ref 8.7–10.3)
Chloride: 101 mmol/L (ref 96–106)
Creatinine, Ser: 0.75 mg/dL (ref 0.57–1.00)
Globulin, Total: 3 g/dL (ref 1.5–4.5)
Glucose: 142 mg/dL — ABNORMAL HIGH (ref 65–99)
Potassium: 4.2 mmol/L (ref 3.5–5.2)
Sodium: 140 mmol/L (ref 134–144)
Total Protein: 7.8 g/dL (ref 6.0–8.5)
eGFR: 87 mL/min/{1.73_m2} (ref >59)

## 2020-04-02 LAB — HEMOGLOBIN A1C
Est. average glucose Bld gHb Est-mCnc: 189 mg/dL
Hgb A1c MFr Bld: 8.2 % — ABNORMAL HIGH (ref 4.8–5.6)

## 2020-04-02 LAB — CBC
Hematocrit: 40.3 % (ref 34.0–46.6)
Hemoglobin: 13.4 g/dL (ref 11.1–15.9)
MCH: 27.9 pg (ref 26.6–33.0)
MCHC: 33.3 g/dL (ref 31.5–35.7)
MCV: 84 fL (ref 79–97)
Platelets: 295 10*3/uL (ref 150–450)
RBC: 4.81 x10E6/uL (ref 3.77–5.28)
RDW: 13 % (ref 11.7–15.4)
WBC: 6.6 10*3/uL (ref 3.4–10.8)

## 2020-04-02 LAB — LIPID PANEL
Chol/HDL Ratio: 3.2 ratio (ref 0.0–4.4)
Cholesterol, Total: 151 mg/dL (ref 100–199)
HDL: 47 mg/dL (ref >39)
LDL Chol Calc (NIH): 84 mg/dL (ref 0–99)
Triglycerides: 112 mg/dL (ref 0–149)
VLDL Cholesterol Cal: 20 mg/dL (ref 5–40)

## 2020-04-04 ENCOUNTER — Other Ambulatory Visit: Payer: Self-pay

## 2020-04-04 ENCOUNTER — Telehealth: Payer: Self-pay

## 2020-04-04 ENCOUNTER — Ambulatory Visit
Admission: RE | Admit: 2020-04-04 | Discharge: 2020-04-04 | Disposition: A | Discharge status: Home / Self Care | Payer: Medicare Other | Source: Ambulatory Visit | Attending: Internal Medicine | Admitting: Internal Medicine

## 2020-04-04 ENCOUNTER — Other Ambulatory Visit: Payer: Self-pay | Admitting: Family Medicine

## 2020-04-04 DIAGNOSIS — E1159 Type 2 diabetes mellitus with other circulatory complications: Secondary | ICD-10-CM

## 2020-04-04 DIAGNOSIS — Z853 Personal history of malignant neoplasm of breast: Secondary | ICD-10-CM

## 2020-04-04 DIAGNOSIS — Z794 Long term (current) use of insulin: Secondary | ICD-10-CM

## 2020-04-04 DIAGNOSIS — Z9889 Other specified postprocedural states: Secondary | ICD-10-CM

## 2020-04-04 MED ORDER — LANTUS SOLOSTAR 100 UNIT/ML ~~LOC~~ SOPN: 10.0000 [IU] | PEN_INJECTOR | Freq: Two times a day (BID) | SUBCUTANEOUS | 6 refills | Status: DC

## 2020-04-04 NOTE — Telephone Encounter (Signed)
Contacted pt to go over MM and labs results pt didn't answer left a detailed vm informing her of results and if she has any questions or concerns to give [place] a call

## 2020-04-07 ENCOUNTER — Telehealth: Payer: Self-pay

## 2020-04-07 NOTE — Telephone Encounter (Signed)
Patient name and DOB has been verified Patient was informed of lab results. Patient had no questions.  

## 2020-04-07 NOTE — Telephone Encounter (Signed)
-----   Message from Charlott Rakes, MD sent at 04/04/2020  8:22 AM EDT ----- Please inform her that her cholesterol is normal. A1c is elevated at 8.2 and I would like her to increase her Lantus to 10 units. Thanks

## 2020-04-15 DIAGNOSIS — C50411 Malignant neoplasm of upper-outer quadrant of right female breast: Secondary | ICD-10-CM | POA: Diagnosis not present

## 2020-04-15 MED FILL — LANTUS SOLOSTAR 100 UNITS/M: 100 | 75 days supply | Qty: 15 | Fill #0

## 2020-04-16 ENCOUNTER — Other Ambulatory Visit: Payer: Self-pay

## 2020-04-26 ENCOUNTER — Other Ambulatory Visit: Payer: Self-pay

## 2020-04-26 MED FILL — Clopidogrel Bisulfate Tab 75 MG (Base Equiv): ORAL | 90 days supply | Qty: 90 | Fill #0 | Status: AC

## 2020-04-26 MED FILL — Metformin HCl Tab 1000 MG: ORAL | 90 days supply | Qty: 180 | Fill #0 | Status: AC

## 2020-05-06 ENCOUNTER — Other Ambulatory Visit: Payer: Self-pay

## 2020-05-06 MED FILL — Anastrozole Tab 1 MG: ORAL | 90 days supply | Qty: 90 | Fill #0 | Status: AC

## 2020-05-09 ENCOUNTER — Other Ambulatory Visit: Payer: Self-pay

## 2020-05-09 MED FILL — Rosuvastatin Calcium Tab 40 MG: ORAL | 90 days supply | Qty: 90 | Fill #0 | Status: AC

## 2020-06-14 ENCOUNTER — Other Ambulatory Visit: Payer: Self-pay

## 2020-06-14 MED FILL — Insulin Glargine Soln Pen-Injector 100 Unit/ML: SUBCUTANEOUS | 75 days supply | Qty: 15 | Fill #0 | Status: AC

## 2020-06-15 ENCOUNTER — Other Ambulatory Visit: Payer: Self-pay

## 2020-06-27 ENCOUNTER — Other Ambulatory Visit: Payer: Self-pay

## 2020-06-27 MED FILL — Amlodipine Besylate Tab 10 MG (Base Equivalent): ORAL | 90 days supply | Qty: 90 | Fill #0 | Status: AC

## 2020-06-28 ENCOUNTER — Other Ambulatory Visit: Payer: Self-pay

## 2020-07-21 ENCOUNTER — Other Ambulatory Visit: Payer: Self-pay

## 2020-07-21 MED FILL — Clopidogrel Bisulfate Tab 75 MG (Base Equiv): ORAL | 90 days supply | Qty: 90 | Fill #1 | Status: AC

## 2020-07-22 ENCOUNTER — Other Ambulatory Visit: Payer: Self-pay

## 2020-07-22 ENCOUNTER — Ambulatory Visit: Payer: Medicare Other | Attending: Internal Medicine | Admitting: Internal Medicine

## 2020-07-22 ENCOUNTER — Encounter: Payer: Self-pay | Admitting: Internal Medicine

## 2020-07-22 VITALS — BP 122/73 | HR 85 | Temp 98.4°F | Resp 16 | Wt 193.0 lb

## 2020-07-22 DIAGNOSIS — I779 Disorder of arteries and arterioles, unspecified: Secondary | ICD-10-CM | POA: Diagnosis not present

## 2020-07-22 DIAGNOSIS — E1159 Type 2 diabetes mellitus with other circulatory complications: Secondary | ICD-10-CM

## 2020-07-22 DIAGNOSIS — L84 Corns and callosities: Secondary | ICD-10-CM | POA: Diagnosis not present

## 2020-07-22 DIAGNOSIS — E785 Hyperlipidemia, unspecified: Secondary | ICD-10-CM | POA: Diagnosis not present

## 2020-07-22 DIAGNOSIS — E1169 Type 2 diabetes mellitus with other specified complication: Secondary | ICD-10-CM | POA: Diagnosis not present

## 2020-07-22 DIAGNOSIS — I152 Hypertension secondary to endocrine disorders: Secondary | ICD-10-CM

## 2020-07-22 DIAGNOSIS — E669 Obesity, unspecified: Secondary | ICD-10-CM

## 2020-07-22 LAB — POCT GLYCOSYLATED HEMOGLOBIN (HGB A1C): HbA1c, POC (controlled diabetic range): 8 % — AB (ref 0.0–7.0)

## 2020-07-22 LAB — GLUCOSE, POCT (MANUAL RESULT ENTRY): POC Glucose: 181 mg/dl — AB (ref 70–99)

## 2020-07-22 MED ORDER — TRULICITY 0.75 MG/0.5ML ~~LOC~~ SOAJ
0.7500 mg | SUBCUTANEOUS | 5 refills | Status: DC
  Filled 2020-07-22: qty 2, 28d supply, fill #0
  Filled 2020-08-17: qty 2, 28d supply, fill #1
  Filled 2020-09-12: qty 2, 28d supply, fill #2
  Filled 2020-10-10: qty 2, 28d supply, fill #3
  Filled 2020-11-07: qty 2, 28d supply, fill #4
  Filled 2020-12-06: qty 2, 28d supply, fill #5

## 2020-07-22 NOTE — Progress Notes (Signed)
Patient ID: Sandra Shelton, female    DOB: 02-17-1955  MRN: [id number]  CC: Diabetes   Subjective: Sandra Shelton is a 65 y.o. female who presents for chronic ds management. 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  She is on Lantus 10 units and metformin    Medication side effects:  _1  Yes    _2  No Home Monitoring?  _3  Yes  2 x/day before meals Home glucose results range:BS 160-180 in mornings; before dinner 112-130 Diet Adherence:  finds her self snacking on crackers at nights.  Overall she fells she is doing good with eating habits Exercise: _4  Yes -walking daily for 30 mins Hypoglycemic episodes?: _5  Yes    _6  No Numbness of the feet? _7  Yes    _8  No resolved since no longer getting chemo Retinopathy hx? _9  Yes    _10  No Last eye exam:  Comments:   HYPERTENSION Currently taking: see medication list.  She is on amlodipine and lisinopril. Med Adherence: _11  Yes    _12  No Medication side effects: _13  Yes    _14  No Adherence with salt restriction: _15  Yes    _16  No Home Monitoring?: _17  Yes    _18  No Monitoring Frequency:  daily Home BP results range:  130-134/70s SOB? _19  Yes    _20  No Chest Pain?: _21  Yes    _22  No Leg swelling?: _23  Yes    _24  No Headaches?: _25  Yes -if out in sun too long   Dizziness? _26  Yes    _27  No Comments:   Hx of CAS:  sees Dr. Oneida Alar once a yr.  Taking and tolerating Crestor and plavix.  Last LDL was  84   HM: Due for shingles vaccine.  She wants to hold off on doing that.  Patient Active Problem List   Diagnosis Date Noted   Advance directive discussed with patient 11/24/2019   Obesity (BMI 30-39.9) 04/21/2019   Port-A-Cath in place 05/06/2018   Malignant neoplasm of upper-outer quadrant of right breast in female, estrogen receptor positive ([place]) 02/10/2018   Hypercalcemia 12/09/2017   Diabetes mellitus type II, controlled ([place]) 09/02/2017   Essential hypertension 09/02/2017   Left-sided carotid artery disease ([place]) 09/02/2017   Pre-ulcerative corn or callous 94/07/6806   Embolic stroke involving left carotid artery ([place]) 07/24/2017   Cerebral thrombosis with cerebral infarction 07/08/2017     Current Outpatient Medications on File Prior to Visit  Medication Sig Dispense Refill   amLODipine (NORVASC) 10 MG tablet TAKE 1 TABLET (10 MG TOTAL) BY MOUTH DAILY. 90 tablet 3   anastrozole (ARIMIDEX) 1 MG tablet TAKE 1 TABLET BY MOUTH ONCE DAILY 210 tablet 0   Artificial Tear Ointment (DRY EYES OP) Place 1 drop into both eyes daily as needed (Dry eyes).     aspirin 81 MG tablet  Take 1 tablet (81 mg total) by mouth daily. 100 tablet 2   CALCIUM PO Take 15 mLs by mouth daily.     clopidogrel (PLAVIX) 75 MG tablet TAKE 1 TABLET (75 MG TOTAL) BY MOUTH DAILY. 30 tablet 6   Elderberry 575 MG/5ML SYRP Take 15 mLs by mouth daily as needed.      LANTUS SOLOSTAR 100 UNIT/ML Solostar Pen INJECT 10 UNITS INTO THE SKIN 2 (TWO) TIMES DAILY. 15 mL 6   lisinopril (ZESTRIL) 10 MG tablet Take 1 tablet (10 mg total) by mouth daily. 90 tablet 3   metFORMIN (GLUCOPHAGE) 1000 MG tablet TAKE 1 TABLET (1,000 MG TOTAL) BY MOUTH 2 (TWO) TIMES DAILY WITH A MEAL. 180 tablet 2   rosuvastatin (CRESTOR) 40 MG tablet TAKE 1 TABLET (40 MG TOTAL) BY MOUTH DAILY. 90 tablet 2   blood glucose meter kit and supplies KIT Dispense based on patient and insurance preference. Use up to four times daily as directed. (FOR ICD-9  250.00, 250.01). 1 each 0   Insulin Pen Needle (TRUEPLUS PEN NEEDLES) 32G X 4 MM MISC Use as directed to inject insulin 100 each 3   No current facility-administered medications on file prior to visit.    Not on File  Social History   Socioeconomic History   Marital status: Single    Spouse name: Not on file   Number of children: Not on file   Years of education: Not on file   Highest education level: Not on file  Occupational History   Not on file  Tobacco Use   Smoking status: Never   Smokeless tobacco: Never  Vaping Use   Vaping Use: Never used  Substance and Sexual Activity   Alcohol use: No   Drug use: No   Sexual activity: Not on file  Other Topics Concern   Not on file  Social History Narrative   Lives home alone.  Single.  Education Some college.  No children.     Social Determinants of Health   Financial Resource Strain: Not on file  Food Insecurity: Not on file  Transportation Needs: Not on file  Physical Activity: Not on file  Stress: Not on file  Social Connections: Not on file  Intimate Partner Violence: Not on file    Family History  Problem Relation Age of Onset   Diabetes Mellitus II Sister    Stroke Mother    Diabetes Mellitus II Mother    Prostate cancer Father    Diabetes Mellitus II Brother    Breast cancer Maternal Aunt    Diabetes Maternal Aunt    Colon cancer Neg Hx    Esophageal cancer Neg Hx    Stomach cancer Neg Hx    Pancreatic cancer Neg Hx    Liver disease Neg Hx     Past Surgical History:  Procedure Laterality Date   BREAST EXCISIONAL BIOPSY Right    BREAST LUMPECTOMY Right 03/2018   BREAST LUMPECTOMY WITH RADIOACTIVE SEED AND SENTINEL LYMPH NODE BIOPSY Right 03/24/2018   Procedure: RIGHT BREAST LUMPECTOMY WITH RADIOACTIVE SEED AND RIGHT AXILLARY DEEP SENTINEL LYMPH NODE BIOPSY INJECT BLUE DYE RIGHT BREAST;  Surgeon: Fanny Skates, MD;  Location: [facility];  Service: General;  Laterality: Right;    COLONOSCOPY     PORT-A-CATH REMOVAL N/A 05/19/2019   Procedure: PORT REMOVAL;  Surgeon: Donnie Mesa, MD;  Location: [facility];  Service: General;  Laterality: N/A;   PORTACATH PLACEMENT N/A 04/09/2018   Procedure: INSERTION PORT-A-CATH  WITH ULTRASOUND;  Surgeon: Fanny Skates, MD;  Location: [facility];  Service: General;  Laterality: N/A;   TRANSCAROTID ARTERY REVASCULARIZATION  Left 07/24/2017   Procedure: TRANSCAROTID ARTERY REVASCULARIZATION, left;  Surgeon: Elam Dutch, MD;  Location: [facility];  Service: Vascular;  Laterality: Left;   UTERINE FIBROID SURGERY     2005    ROS: Review of Systems Negative except as stated above  PHYSICAL EXAM: BP 122/73 (BP Location: Left Arm, Patient Position: Sitting, Cuff Size: Large)   Pulse 85   Temp 98.4 F (36.9 C) (Oral)   Resp 16   Wt 193 lb (87.5 kg)   LMP  (LMP Unknown)   SpO2 98%   BMI 32.12 kg/m   Wt Readings from Last 3 Encounters:  07/22/20 193 lb (87.5 kg)  11/24/19 194 lb 9.6 oz (88.3 kg)  10/28/19 191 lb 14.4 oz (87 kg)    Physical Exam  General appearance - alert, well appearing, and in no distress Mental status - normal mood, behavior, speech, dress, motor activity, and thought processes Neck - supple, no significant adenopathy Chest - clear to auscultation, no wheezes, rales or rhonchi, symmetric air entry Heart - normal rate, regular rhythm, normal S1, S2, no murmurs, rubs, clicks or gallops Extremities - peripheral pulses normal, no pedal edema, no clubbing or cyanosis Diabetic Foot Exam - Simple   Simple Foot Form Visual Inspection Sensation Testing Intact to touch and monofilament testing bilaterally: Yes Pulse Check Posterior Tibialis and Dorsalis pulse intact bilaterally: Yes Comments She has callus on the ball of both big toes and both fifth toes.      CMP Latest Ref Rng & Units 04/01/2020 05/19/2019 03/10/2019  Glucose 65 - 99 mg/dL 142(H) 166(H) 347(H)  BUN 8 - 27 mg/dL _0 Creatinine 0.57 - 1.00 mg/dL 0.75 0.74 1.04(H)  Sodium 134 - 144 mmol/L 140 138 137  Potassium 3.5 - 5.2 mmol/L 4.2 3.9 4.0  Chloride 96 - 106 mmol/L 101 108 104  CO2 20 - 29 mmol/L 19(L) 22 22  Calcium 8.7 - 10.3 mg/dL 10.4(H) 9.8 9.6  Total Protein 6.0 - 8.5 g/dL 7.8 7.7 7.6  Total Bilirubin 0.0 - 1.2 mg/dL <0.2 0.4 <0.2(L)  Alkaline Phos 44 - 121 IU/L 66 52 58  AST 0 - 40 IU/L 15 18 13(L)  ALT 0 - 32 IU/L _1 Lipid Panel     Component Value Date/Time   CHOL 151 04/01/2020 0836   TRIG 112 04/01/2020 0836   HDL 47 04/01/2020 0836   CHOLHDL 3.2 04/01/2020 0836   CHOLHDL 8.2 07/09/2017 0646   VLDL 76 (H) 07/09/2017 0646   LDLCALC 84 04/01/2020 0836    CBC    Component Value Date/Time   WBC 6.6 04/01/2020 0836   WBC 6.1 05/19/2019 0730   RBC 4.81 04/01/2020 0836   RBC 4.72 05/19/2019 0730   HGB 13.4 04/01/2020 0836   HCT 40.3 04/01/2020 0836   PLT 295 04/01/2020 0836   MCV 84 04/01/2020 0836   MCH 27.9 04/01/2020 0836   MCH 27.5 05/19/2019 0730   MCHC 33.3 04/01/2020 0836   MCHC 31.9 05/19/2019 0730   RDW 13.0 04/01/2020 0836   LYMPHSABS 1.5 03/10/2019 0845   MONOABS 0.4 03/10/2019 0845   EOSABS 0.1 03/10/2019 0845   BASOSABS 0.0 03/10/2019 0845    ASSESSMENT AND PLAN: 1. Type 2 diabetes mellitus with obesity (HCC) Not at goal. Commended her on regular exercise  and healthy eating habits. We discussed starting Trulicity which will help get her diabetes under better control and also allow for some weight loss.  I went over with her how the medication works and possible side effects.  Advised her that should she develop any vomiting or epigastric abdominal pain which could be indicative of pancreatitis, she should stop the medicine and let me know.  If she finds that her blood sugars start falling below 80 once she starts the Trulicity, she should cut back on the units of Lantus by 3 to 4 units.  She met with the clinical pharmacist today for teaching on  Trulicity administration. - HgB A1c - Glucose (CBG) - Microalbumin / creatinine urine ratio - Dulaglutide (TRULICITY) 4.31 VQ/0.0QQ SOPN; Inject 0.75 mg into the skin once a week.  Dispense: 2 mL; Refill: 5  2. Hypertension associated with diabetes ([place]) At goal.  Continue current medications and low-salt diet.  3. Left-sided carotid artery disease, unspecified type ([place]) Stable.  She will be due for follow-up with Dr. Oneida Alar later this year.  4. Hyperlipidemia associated with type 2 diabetes mellitus (HCC) Continue Crestor  5. Pre-ulcerative corn or callous - Ambulatory referral to Podiatry    Patient was given the opportunity to ask questions.  Patient verbalized understanding of the plan and was able to repeat key elements of the plan.   Orders Placed This Encounter  Procedures   Microalbumin / creatinine urine ratio   Ambulatory referral to Podiatry   HgB A1c   Glucose (CBG)     Requested Prescriptions   Signed Prescriptions Disp Refills   Dulaglutide (TRULICITY) 7.61 PJ/0.9TO SOPN 2 mL 5    Sig: Inject 0.75 mg into the skin once a week.    Return in about 4 months (around 11/22/2020).  [name], MD, FACP

## 2020-07-22 NOTE — Progress Notes (Signed)
F/u diabetes 

## 2020-07-22 NOTE — Patient Instructions (Signed)
Once you start the Trulicity, continue to monitor your blood sugars.  If you find that your fasting blood sugars start to drop below 80, you can decrease the Lantus insulin by 3 to 4 units.  If you experience any vomiting or upper abdominal pain once you start the Trulicity, please stop the medication and give me a call.

## 2020-07-23 LAB — MICROALBUMIN / CREATININE URINE RATIO
Creatinine, Urine: 240.5 mg/dL
Microalb/Creat Ratio: 12 mg/g creat (ref 0–29)
Microalbumin, Urine: 27.9 ug/mL

## 2020-07-24 NOTE — Progress Notes (Signed)
Let pt know that there is no significant amount of protein in the urine which is good.

## 2020-07-25 ENCOUNTER — Encounter: Payer: Self-pay | Admitting: Hematology and Oncology

## 2020-07-27 ENCOUNTER — Telehealth: Payer: Self-pay

## 2020-07-27 NOTE — Telephone Encounter (Signed)
Contacted pt to go over lab results pt didn't answer lvm   Sent a CRM and forward labs to NT to give pt labs when they call back   

## 2020-08-10 ENCOUNTER — Other Ambulatory Visit: Payer: Self-pay

## 2020-08-10 MED FILL — Rosuvastatin Calcium Tab 40 MG: ORAL | 90 days supply | Qty: 90 | Fill #1 | Status: AC

## 2020-08-10 MED FILL — Anastrozole Tab 1 MG: ORAL | 30 days supply | Qty: 30 | Fill #1 | Status: AC

## 2020-08-10 MED FILL — Metformin HCl Tab 1000 MG: ORAL | 90 days supply | Qty: 180 | Fill #1 | Status: AC

## 2020-08-11 ENCOUNTER — Other Ambulatory Visit: Payer: Self-pay

## 2020-08-11 ENCOUNTER — Encounter: Payer: Self-pay | Admitting: Podiatry

## 2020-08-11 ENCOUNTER — Ambulatory Visit (INDEPENDENT_AMBULATORY_CARE_PROVIDER_SITE_OTHER): Payer: Medicare Other | Admitting: Podiatry

## 2020-08-11 DIAGNOSIS — M2142 Flat foot [pes planus] (acquired), left foot: Secondary | ICD-10-CM

## 2020-08-11 DIAGNOSIS — M2141 Flat foot [pes planus] (acquired), right foot: Secondary | ICD-10-CM | POA: Diagnosis not present

## 2020-08-11 DIAGNOSIS — L84 Corns and callosities: Secondary | ICD-10-CM

## 2020-08-11 DIAGNOSIS — M2041 Other hammer toe(s) (acquired), right foot: Secondary | ICD-10-CM

## 2020-08-11 DIAGNOSIS — E1165 Type 2 diabetes mellitus with hyperglycemia: Secondary | ICD-10-CM | POA: Diagnosis not present

## 2020-08-11 DIAGNOSIS — M2042 Other hammer toe(s) (acquired), left foot: Secondary | ICD-10-CM | POA: Diagnosis not present

## 2020-08-11 DIAGNOSIS — Z794 Long term (current) use of insulin: Secondary | ICD-10-CM | POA: Diagnosis not present

## 2020-08-11 NOTE — Patient Instructions (Signed)
Look for urea 40% cream or ointment and apply to the thickened dry skin / calluses. This can be bought over the counter, at a pharmacy or online such as [employer].  

## 2020-08-12 NOTE — Progress Notes (Signed)
  Subjective:  Patient ID: Sandra Shelton, female    DOB: May 27, 1955,  MRN: [id number]  Chief Complaint  Patient presents with   Diabetes    NP - diabetic foot care, A1C  8.0   Callouses     Pre-ulcerative corn or callous    65 y.o. female presents with the above complaint. History confirmed with patient.  She has multiple callus lesions.  Her A1c is 8.0%.  She denies numbness tingling or paresthesias in the feet.  No history of ulcerations.  Objective:  Physical Exam: warm, good capillary refill, no trophic changes or ulcerative lesions, normal DP and PT pulses, normal monofilament exam, normal sensory exam, and multiple hyperkeratotic lesions including bilateral plantar heel submetatarsal 1 and 5 and plantar hallux IPJ.  Hammertoes and pes planus bilateral  Assessment:   1. Callus of foot   2. Pes planus of both feet   3. Hammertoe of left foot   4. Hammertoe of right foot   5. Controlled type 2 diabetes mellitus with hyperglycemia, with long-term current use of insulin ([place])      Plan:  Patient was evaluated and treated and all questions answered.  Patient educated on diabetes. Discussed proper diabetic foot care and discussed risks and complications of disease. Educated patient in depth on reasons to return to the office immediately should he/she discover anything concerning or new on the feet. All questions answered. Discussed proper shoes as well.   Discussed the preulcerative callus and discussed how to care for these at home with a urea-based cream which I recommended.  Also recommended diabetic shoes to offload lesions and prevent risk of ulceration.  She will be fitted for these.  Return to see me in 1 year for diabetic foot exam  Return in about 1 year (around 08/11/2021) for diabetic foot examination.

## 2020-08-17 ENCOUNTER — Other Ambulatory Visit: Payer: Self-pay

## 2020-08-17 MED FILL — Insulin Glargine Soln Pen-Injector 100 Unit/ML: SUBCUTANEOUS | 75 days supply | Qty: 15 | Fill #1 | Status: AC

## 2020-08-18 ENCOUNTER — Other Ambulatory Visit: Payer: Self-pay

## 2020-08-19 ENCOUNTER — Other Ambulatory Visit: Payer: Self-pay

## 2020-08-22 ENCOUNTER — Ambulatory Visit: Payer: Medicare Other | Attending: Internal Medicine | Admitting: Pharmacist

## 2020-08-22 ENCOUNTER — Encounter: Payer: Self-pay | Admitting: Pharmacist

## 2020-08-22 ENCOUNTER — Other Ambulatory Visit: Payer: Self-pay

## 2020-08-22 DIAGNOSIS — E1169 Type 2 diabetes mellitus with other specified complication: Secondary | ICD-10-CM | POA: Diagnosis not present

## 2020-08-22 DIAGNOSIS — E669 Obesity, unspecified: Secondary | ICD-10-CM

## 2020-08-22 LAB — GLUCOSE, POCT (MANUAL RESULT ENTRY): POC Glucose: 183 mg/dl — AB (ref 70–99)

## 2020-08-22 NOTE — Progress Notes (Signed)
    S:     No chief complaint on file.  Patient arrives in good spirits. Presents for diabetes evaluation, education, and management. Patient was referred and last seen by Primary Care Provider on 07/22/2020. Trulicity was started at that visit.   Today, she reports tolerating the Trulicity well with no NV, abdominal pain. Denies any changes in vision.   Family/Social History:  -Fhx: DM, stroke  -Tobacco: never smoker -Alcohol: denies use   Insurance coverage/medication affordability: [employer]  Medication adherence reported.   Current diabetes medications include: dulaglutide 0.75 mg weekly, Lantus 10u BID (pt takes 6 units BID), metformin 1000 mg BID  Patient denies hypoglycemic events.  Patient reported dietary habits:  - Overall, reports doing well with her diet   Patient-reported exercise habits:  - Walks daily sometimes for ~30-40 minutes daily    Patient denies nocturia (nighttime urination).  Patient denies neuropathy (nerve pain). Patient denies visual changes. Patient reports self foot exams.     O:  POCT glucose: 183   Lab Results  Component Value Date   HGBA1C 8.0 (A) 07/22/2020   There were no vitals filed for this visit.  Lipid Panel     Component Value Date/Time   CHOL 151 04/01/2020 0836   TRIG 112 04/01/2020 0836   HDL 47 04/01/2020 0836   CHOLHDL 3.2 04/01/2020 0836   CHOLHDL 8.2 07/09/2017 0646   VLDL 76 (H) 07/09/2017 0646   LDLCALC 84 04/01/2020 0836   No meter with her today.  Home fasting blood sugars: reports low 100s -130s  2 hour post-meal/random blood sugars: denies any readings >200.  Clinical Atherosclerotic Cardiovascular Disease (ASCVD): Yes  The ASCVD Risk score Mikey Bussing DC Jr., et al., 2013) failed to calculate for the following reasons:   The patient has a prior MI or stroke diagnosis   A/P: Diabetes longstanding currently with A1c above goal. Reported home CBGs look to be improving. Patient is able to verbalize  appropriate hypoglycemia management plan. Medication adherence appears appropriate. Offered to titrate Trulicity but patient would prefer to hold off on changing anything today.  -Continued current medication regimen.  -Extensively discussed pathophysiology of diabetes, recommended lifestyle interventions, dietary effects on blood sugar control -Counseled on s/sx of and management of hypoglycemia -Next A1C anticipated 10/2020.   Written patient instructions provided.  Total time in face to face counseling 30 minutes.   Follow up Pharmacist Clinic Visit in 6 weeks.    Benard Halsted, PharmD, [facility] [place], [place] [phone]

## 2020-09-06 ENCOUNTER — Other Ambulatory Visit: Payer: Self-pay

## 2020-09-06 ENCOUNTER — Other Ambulatory Visit: Payer: Self-pay | Admitting: Hematology and Oncology

## 2020-09-06 MED ORDER — ANASTROZOLE 1 MG PO TABS
ORAL_TABLET | Freq: Every day | ORAL | 0 refills | Status: DC
  Filled 2020-09-06: qty 30, 30d supply, fill #0

## 2020-09-12 MED FILL — Amlodipine Besylate Tab 10 MG (Base Equivalent): ORAL | 90 days supply | Qty: 90 | Fill #1 | Status: AC

## 2020-09-12 NOTE — Assessment & Plan Note (Signed)
02/04/2018:Screening mammogram detected right breast mass and asymmetry in the left breast. The left breast asymmetry resolved. 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 Adj chemo with Taxol Herceptin weekly X 11 (stopped for peripheral neuropathy)started 04/22/2018-07/01/2018,now on [name] to complete 1 full year of therapy completed March 2021 2. Adjuvant radiation therapy completed 08/22/2018 3. Adjuvant antiestrogen therapy started 09/05/2018 ------------------------------------------------------------------------------------------------------------------------ Current treatment: Anastrozole 1 mg daily started 09/05/2018 Anastrozole toxicities: Mild hot flashes and muscle stiffness but otherwise doing quite well  Breast cancer surveillance: 1.  Breast exam 09/13/2020: Benign 2. Mammograms 04/04/20: Benign breast density category C 3.  Bone density 05/12/2019: T score -1: Normal  Return to clinic in 1 year for follow-up

## 2020-09-12 NOTE — Progress Notes (Signed)
Patient Care Team: [name], MD as PCP - General (Internal Medicine) Fanny Skates, MD as Consulting Physician (General Surgery) Nicholas Lose, MD as Consulting Physician (Hematology and Oncology) Gery Pray, MD as Consulting Physician (Radiation Oncology) Rockwell Germany, RN as Oncology Nurse Navigator Mauro Kaufmann, RN as Oncology Nurse Navigator  DIAGNOSIS:    ICD-10-CM   1. Malignant neoplasm of upper-outer quadrant of right breast in female, estrogen receptor positive ([place])  C50.411    Z17.0       SUMMARY OF ONCOLOGIC HISTORY: Oncology History  Malignant neoplasm of upper-outer quadrant of right breast in female, estrogen receptor positive ([place])  01/27/2018 Mammogram   Screening Mammogram  Possible right breast mass; asymmetry in left breast.   02/10/2018 Initial Diagnosis   Screening mammogram detected right breast mass and asymmetry in the left breast.  The left breast asymmetry resolved.  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%, HER-2 +3+ by IHC, T1BN0 stage Ia clinical stage   03/24/2018 Surgery   Lumpectomy and SLNB: IDC, 1.2cm, grade 2, margins negative, 4 SLN negative, T1cN0, previously ER/PR/HER-2 positive with Ki-67 of 20%   03/24/2018 Cancer Staging   Staging form: Breast, AJCC 8th Edition - Pathologic stage from 03/24/2018: Stage IA (pT1c, pN0, cM0, G2, ER+, PR+, HER2+) - Signed by Gardenia Phlegm, NP on 03/26/2018   04/22/2018 - 06/24/2018 Chemotherapy   The patient had trastuzumab (HERCEPTIN) 336 mg in sodium chloride 0.9 % 250 mL chemo infusion, 4 mg/kg = 336 mg, Intravenous,  Once, 3 of 16 cycles Administration: 336 mg (04/22/2018), 168 mg (04/29/2018), 168 mg (05/21/2018), 168 mg (05/06/2018), 168 mg (05/13/2018), 168 mg (05/27/2018), 168 mg (06/03/2018), 168 mg (06/10/2018), 168 mg (06/17/2018), 168 mg (06/24/2018) PACLitaxel (TAXOL) 156  mg in sodium chloride 0.9 % 250 mL chemo infusion (</= 61m/m2), 80 mg/m2 = 156 mg, Intravenous,  Once, 3 of 3 cycles Administration: 156 mg (04/22/2018), 156 mg (04/29/2018), 156 mg (05/21/2018), 156 mg (05/06/2018), 156 mg (05/13/2018), 156 mg (05/27/2018), 156 mg (06/03/2018), 156 mg (06/10/2018), 156 mg (06/17/2018), 156 mg (06/24/2018)   for chemotherapy treatment.     07/01/2018 - 03/31/2019 Chemotherapy          07/29/2018 - 08/22/2018 Radiation Therapy   Adjuvant XRT   09/05/2018 -  Anti-estrogen oral therapy   Anastrozole 165mdaily, plan for 5-7 years      CHIEF COMPLIANT: Follow-up of right breast cancer on anastrozole   INTERVAL HISTORY: Sandra Shelton a [age].o. with above-mentioned history of right breast cancer who underwent a lumpectomy, adjuvant chemotherapy, radiation, [name], and is currently on anti-estrogen therapy with anastrozole. Mammogram on 04/04/2020 showed no evidence of malignancy. She presents to the clinic today for follow-up.  She reports no new problems or concerns.  She has not been exercising regularly.  Denies any lumps or nodules in the breast.  ALLERGIES:  has No Known Allergies.  MEDICATIONS:  Current Outpatient Medications  Medication Sig Dispense Refill   amLODipine (NORVASC) 10 MG tablet TAKE 1 TABLET (10 MG TOTAL) BY MOUTH DAILY. 90 tablet 3   anastrozole (ARIMIDEX) 1 MG tablet TAKE 1 TABLET BY MOUTH ONCE DAILY 210 tablet 0   Artificial Tear Ointment (DRY EYES OP) Place 1 drop into both eyes daily as needed (Dry eyes).     aspirin 81 MG tablet Take 1 tablet (81 mg total)  by mouth daily. 100 tablet 2   blood glucose meter kit and supplies KIT Dispense based on patient and insurance preference. Use up to four times daily as directed. (FOR ICD-9 250.00, 250.01). 1 each 0   CALCIUM PO Take 15 mLs by mouth daily.     clopidogrel (PLAVIX) 75 MG tablet TAKE 1 TABLET (75 MG TOTAL) BY MOUTH DAILY. 30 tablet 6   Dulaglutide (TRULICITY) 7.89 FY/1.0FB  SOPN Inject 0.75 mg into the skin once a week. 2 mL 5   Elderberry 575 MG/5ML SYRP Take 15 mLs by mouth daily as needed.      Insulin Pen Needle (TRUEPLUS PEN NEEDLES) 32G X 4 MM MISC Use as directed to inject insulin 100 each 3   LANTUS SOLOSTAR 100 UNIT/ML Solostar Pen INJECT 10 UNITS INTO THE SKIN 2 (TWO) TIMES DAILY. 15 mL 6   lisinopril (ZESTRIL) 10 MG tablet Take 1 tablet (10 mg total) by mouth daily. 90 tablet 3   metFORMIN (GLUCOPHAGE) 1000 MG tablet TAKE 1 TABLET (1,000 MG TOTAL) BY MOUTH 2 (TWO) TIMES DAILY WITH A MEAL. 180 tablet 2   rosuvastatin (CRESTOR) 40 MG tablet TAKE 1 TABLET (40 MG TOTAL) BY MOUTH DAILY. 90 tablet 2   No current facility-administered medications for this visit.    PHYSICAL EXAMINATION: ECOG PERFORMANCE STATUS: 1 - Symptomatic but completely ambulatory  Vitals:   09/13/20 0920  BP: 139/67  Pulse: 91  Resp: 16  Temp: 97.7 F (36.5 C)  SpO2: 100%   Filed Weights   09/13/20 0920  Weight: 189 lb 1.6 oz (85.8 kg)    BREAST: No palpable masses or nodules in either right or left breasts. No palpable axillary supraclavicular or infraclavicular adenopathy no breast tenderness or nipple discharge. (exam performed in the presence of a chaperone)  LABORATORY DATA:  I have reviewed the data as listed CMP Latest Ref Rng & Units 04/01/2020 05/19/2019 03/10/2019  Glucose 65 - 99 mg/dL 142(H) 166(H) 347(H)  BUN 8 - 27 mg/dL 13 15 16   Creatinine 0.57 - 1.00 mg/dL 0.75 0.74 1.04(H)  Sodium 134 - 144 mmol/L 140 138 137  Potassium 3.5 - 5.2 mmol/L 4.2 3.9 4.0  Chloride 96 - 106 mmol/L 101 108 104  CO2 20 - 29 mmol/L 19(L) 22 22  Calcium 8.7 - 10.3 mg/dL 10.4(H) 9.8 9.6  Total Protein 6.0 - 8.5 g/dL 7.8 7.7 7.6  Total Bilirubin 0.0 - 1.2 mg/dL <0.2 0.4 <0.2(L)  Alkaline Phos 44 - 121 IU/L 66 52 58  AST 0 - 40 IU/L 15 18 13(L)  ALT 0 - 32 IU/L 16 19 21     Lab Results  Component Value Date   WBC 6.6 04/01/2020   HGB 13.4 04/01/2020   HCT 40.3 04/01/2020    MCV 84 04/01/2020   PLT 295 04/01/2020   NEUTROABS 4.3 03/10/2019    ASSESSMENT & PLAN:  Malignant neoplasm of upper-outer quadrant of right breast in female, estrogen receptor positive ([place]) 02/04/2018:Screening mammogram detected right breast mass and asymmetry in the left breast.  The left breast asymmetry resolved.  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%, HER-2 +3+ by IHC, T1BN0 stage Ia clinical stage   03/24/2018: Rt BCS: Grade 2 IDC 1.2 cm  ER 100%, PR 70%, Ki-67 20%, HER-2 +3+ by IHC, T1BN0 stage Ia Left BCS: DCIS   Treatment plan: 1. Adj chemo  with Taxol Herceptin weekly X 11 (stopped for peripheral neuropathy) started 04/22/2018-07/01/2018, now on [name] to complete 1 full year of therapy completed March 2021 2. Adjuvant radiation therapy completed 08/22/2018 3. Adjuvant antiestrogen therapy started 09/05/2018 ------------------------------------------------------------------------------------------------------------------------ Current treatment: Anastrozole 1 mg daily started 09/05/2018 Anastrozole toxicities: Mild hot flashes and muscle stiffness but otherwise doing quite well   Breast cancer surveillance: 1.  Breast exam 09/13/2020: Benign 2. Mammograms 04/04/20: Benign breast density category C 3.  Bone density 05/12/2019: T score -1: Normal   I discussed with her about checking on if her insurance pays for Silver sneakers program to get some exercise and lose weight.  She will check into that.  Return to clinic in 1 year for follow-up   No orders of the defined types were placed in this encounter.  The patient has a good understanding of the overall plan. she agrees with it. she will call with any problems that may develop before the next visit here.  Total time spent: 20 mins including face to face time and time spent for planning, charting and  coordination of care  Rulon Eisenmenger, MD, MPH 09/13/2020  I, Thana Ates, am acting as scribe for Dr. Nicholas Lose.  I have reviewed the above documentation for accuracy and completeness, and I agree with the above.

## 2020-09-13 ENCOUNTER — Ambulatory Visit: Payer: Medicare Other | Admitting: Hematology and Oncology

## 2020-09-13 ENCOUNTER — Other Ambulatory Visit: Payer: Self-pay

## 2020-09-13 ENCOUNTER — Inpatient Hospital Stay: Payer: Medicare Other | Attending: Hematology and Oncology | Admitting: Hematology and Oncology

## 2020-09-13 DIAGNOSIS — Z79899 Other long term (current) drug therapy: Secondary | ICD-10-CM | POA: Insufficient documentation

## 2020-09-13 DIAGNOSIS — Z79811 Long term (current) use of aromatase inhibitors: Secondary | ICD-10-CM | POA: Diagnosis not present

## 2020-09-13 DIAGNOSIS — C50411 Malignant neoplasm of upper-outer quadrant of right female breast: Secondary | ICD-10-CM

## 2020-09-13 DIAGNOSIS — Z170 Estrogen receptor positive status [ER+]: Secondary | ICD-10-CM | POA: Diagnosis not present

## 2020-09-13 DIAGNOSIS — Z9221 Personal history of antineoplastic chemotherapy: Secondary | ICD-10-CM | POA: Insufficient documentation

## 2020-09-13 DIAGNOSIS — Z923 Personal history of irradiation: Secondary | ICD-10-CM | POA: Insufficient documentation

## 2020-09-13 DIAGNOSIS — Z7982 Long term (current) use of aspirin: Secondary | ICD-10-CM | POA: Diagnosis not present

## 2020-09-13 MED ORDER — ANASTROZOLE 1 MG PO TABS
ORAL_TABLET | Freq: Every day | ORAL | 3 refills | Status: DC
  Filled 2020-09-13: qty 90, fill #0
  Filled 2020-10-03: qty 90, 90d supply, fill #0
  Filled 2020-12-23: qty 90, 90d supply, fill #1
  Filled 2021-03-22: qty 90, 90d supply, fill #0
  Filled 2021-03-22: qty 90, 90d supply, fill #2

## 2020-09-14 ENCOUNTER — Other Ambulatory Visit: Payer: Self-pay

## 2020-10-03 ENCOUNTER — Other Ambulatory Visit: Payer: Self-pay

## 2020-10-03 ENCOUNTER — Ambulatory Visit: Payer: Medicare Other | Attending: Internal Medicine | Admitting: Pharmacist

## 2020-10-03 ENCOUNTER — Encounter: Payer: Self-pay | Admitting: Pharmacist

## 2020-10-03 DIAGNOSIS — E1169 Type 2 diabetes mellitus with other specified complication: Secondary | ICD-10-CM | POA: Diagnosis not present

## 2020-10-03 DIAGNOSIS — E669 Obesity, unspecified: Secondary | ICD-10-CM

## 2020-10-03 NOTE — Progress Notes (Signed)
    S:     No chief complaint on file.  Patient arrives in good spirits. Presents for diabetes evaluation, education, and management. Patient was referred and last seen by Primary Care Provider on 07/22/2020. Pharmacy saw her on 08/22/2020. No changes were made at that visit - patient declined titrating Trulicity dose.   Today, she reports tolerating the Trulicity well with no NV, abdominal pain. Denies any changes in vision.   Family/Social History:  -Fhx: DM, stroke  -Tobacco: never smoker -Alcohol: denies use   Insurance coverage/medication affordability: [employer]  Medication adherence reported.   Current diabetes medications include: dulaglutide 0.75 mg weekly, Lantus 10u BID (pt takes 6 units BID), metformin 1000 mg BID  Patient denies hypoglycemic events.  Patient reported dietary habits:  - Overall, reports doing well with her diet   Patient-reported exercise habits:  - Walks daily sometimes for ~30-40 minutes daily    Patient denies nocturia (nighttime urination).  Patient denies neuropathy (nerve pain). Patient denies visual changes. Patient reports self foot exams.     O:  Lab Results  Component Value Date   HGBA1C 8.0 (A) 07/22/2020   There were no vitals filed for this visit.  Lipid Panel     Component Value Date/Time   CHOL 151 04/01/2020 0836   TRIG 112 04/01/2020 0836   HDL 47 04/01/2020 0836   CHOLHDL 3.2 04/01/2020 0836   CHOLHDL 8.2 07/09/2017 0646   VLDL 76 (H) 07/09/2017 0646   LDLCALC 84 04/01/2020 0836   No meter with her today.  Home fasting blood sugars: reports low 100s -130s  2 hour post-meal/random blood sugars: denies any readings >200.  Clinical Atherosclerotic Cardiovascular Disease (ASCVD): Yes  The ASCVD Risk score (Arnett DK, et al., 2019) failed to calculate for the following reasons:   The patient has a prior MI or stroke diagnosis   A/P: Diabetes longstanding currently with A1c above goal. Reported home CBGs look to  be improving. Patient is able to verbalize appropriate hypoglycemia management plan. Medication adherence appears appropriate.  -Continued current medication regimen.  -Extensively discussed pathophysiology of diabetes, recommended lifestyle interventions, dietary effects on blood sugar control -Counseled on s/sx of and management of hypoglycemia -A1c, future. Patient will return on 10/17/2020 for A1c before seeing PCP in November.   Written patient instructions provided.  Total time in face to face counseling 30 minutes.   Follow up PCP Clinic Visit in November.    Benard Halsted, PharmD, [place], [place] [phone]

## 2020-10-04 ENCOUNTER — Other Ambulatory Visit: Payer: Self-pay

## 2020-10-05 ENCOUNTER — Other Ambulatory Visit: Payer: Self-pay

## 2020-10-10 ENCOUNTER — Other Ambulatory Visit: Payer: Self-pay

## 2020-10-11 ENCOUNTER — Other Ambulatory Visit: Payer: Self-pay

## 2020-10-14 ENCOUNTER — Other Ambulatory Visit: Payer: Self-pay

## 2020-10-14 DIAGNOSIS — I6522 Occlusion and stenosis of left carotid artery: Secondary | ICD-10-CM

## 2020-10-17 ENCOUNTER — Ambulatory Visit: Payer: Medicare Other | Attending: Internal Medicine

## 2020-10-17 ENCOUNTER — Other Ambulatory Visit: Payer: Self-pay

## 2020-10-17 DIAGNOSIS — E669 Obesity, unspecified: Secondary | ICD-10-CM

## 2020-10-17 DIAGNOSIS — E1169 Type 2 diabetes mellitus with other specified complication: Secondary | ICD-10-CM

## 2020-10-17 MED FILL — Clopidogrel Bisulfate Tab 75 MG (Base Equiv): ORAL | 30 days supply | Qty: 30 | Fill #2 | Status: AC

## 2020-10-18 LAB — HEMOGLOBIN A1C
Est. average glucose Bld gHb Est-mCnc: 157 mg/dL
Hgb A1c MFr Bld: 7.1 % — ABNORMAL HIGH (ref 4.8–5.6)

## 2020-10-19 ENCOUNTER — Telehealth: Payer: Self-pay

## 2020-10-19 NOTE — Progress Notes (Signed)
Let patient know that her A1c has improved to 7.1.  The goal is to be less than 7.  Continue healthy eating habits, regular exercise and current dose of her medications.

## 2020-10-19 NOTE — Telephone Encounter (Signed)
Contacted pt to go over lab results pt is aware and doesn't have any questions or concerns 

## 2020-10-28 ENCOUNTER — Other Ambulatory Visit: Payer: Self-pay

## 2020-10-28 ENCOUNTER — Ambulatory Visit (INDEPENDENT_AMBULATORY_CARE_PROVIDER_SITE_OTHER): Payer: Medicare Other | Admitting: Physician Assistant

## 2020-10-28 ENCOUNTER — Ambulatory Visit (HOSPITAL_COMMUNITY)
Admission: RE | Admit: 2020-10-28 | Discharge: 2020-10-28 | Disposition: A | Discharge status: Home / Self Care | Payer: Medicare Other | Source: Ambulatory Visit | Attending: Vascular Surgery | Admitting: Vascular Surgery

## 2020-10-28 VITALS — BP 146/75 | HR 83 | Temp 97.7°F | Ht 65.0 in | Wt 189.2 lb

## 2020-10-28 DIAGNOSIS — Z95828 Presence of other vascular implants and grafts: Secondary | ICD-10-CM

## 2020-10-28 DIAGNOSIS — I6522 Occlusion and stenosis of left carotid artery: Secondary | ICD-10-CM | POA: Diagnosis not present

## 2020-10-28 NOTE — Progress Notes (Signed)
Office Note     CC:  follow up Requesting Provider:  [name], MD  HPI: Sandra Shelton is a 65 y.o. (July 01, 1955) female who presents for surveillance follow up of carotid artery stenosis. She has history of left brain stroke and underwent a left TCAR by Dr. Oneida Alar in 2019. She has been doing well since then.  She denies any amaurosis fugax or other visual changes, slurred speech, facial drooping, unilateral weakness or numbness. She continues to take aspirin, Plavix, statin daily  The patient's PMH, PSH, SH, and FamHx were reviewed and are unchanged from prior visit.  The pt is on a statin for cholesterol management.  The pt is on a daily aspirin.   Other AC:  Plavix The pt is on CCB, ACEI for hypertension.   The pt is diabetic.   Tobacco hx:  never  Past Medical History:  Diagnosis Date   Breast cancer ([place])     IDC - right breast    Diabetes mellitus without complication ([place])    type 2   Embolic stroke involving carotid artery ([facility])    GERD (gastroesophageal reflux disease)    Heart murmur    no problems with it   Hepatitis    in the 80's, took meds at the time    HLD (hyperlipidemia)    Hypertension    Peripheral vascular disease ([place])    carotid artery blockage    Personal history of chemotherapy    completed 03/2019   Personal history of radiation therapy    completed   Sleep apnea    Does not use CPAP   Stroke ([place]) 11/20/2017   Vision abnormalities    glasses    Past Surgical History:  Procedure Laterality Date   BREAST EXCISIONAL BIOPSY Right    BREAST LUMPECTOMY Right 03/2018   BREAST LUMPECTOMY WITH RADIOACTIVE SEED AND SENTINEL LYMPH NODE BIOPSY Right 03/24/2018   Procedure: RIGHT BREAST LUMPECTOMY WITH RADIOACTIVE SEED AND RIGHT AXILLARY DEEP SENTINEL LYMPH NODE BIOPSY INJECT BLUE DYE RIGHT BREAST;  Surgeon: Fanny Skates, MD;  Location: [facility];  Service: General;  Laterality: Right;   COLONOSCOPY     PORT-A-CATH REMOVAL N/A  05/19/2019   Procedure: PORT REMOVAL;  Surgeon: Donnie Mesa, MD;  Location: [facility];  Service: General;  Laterality: N/A;   PORTACATH PLACEMENT N/A 04/09/2018   Procedure: INSERTION PORT-A-CATH WITH ULTRASOUND;  Surgeon: Fanny Skates, MD;  Location: [facility];  Service: General;  Laterality: N/A;   TRANSCAROTID ARTERY REVASCULARIZATION  Left 07/24/2017   Procedure: TRANSCAROTID ARTERY REVASCULARIZATION, left;  Surgeon: Elam Dutch, MD;  [facility];  Service: Vascular;  Laterality: Left;   UTERINE FIBROID SURGERY     2005    Social History   Socioeconomic History   Marital status: Single    Spouse name: Not on file   Number of children: Not on file   Years of education: Not on file   Highest education level: Not on file  Occupational History   Not on file  Tobacco Use   Smoking status: Never   Smokeless tobacco: Never  Vaping Use   Vaping Use: Never used  Substance and Sexual Activity   Alcohol use: No   Drug use: No   Sexual activity: Not on file  Other Topics Concern   Not on file  Social History Narrative   Lives home alone.  Single.  Education Some college.  No children.     Social Determinants  of Health   Financial Resource Strain: Not on file  Food Insecurity: Not on file  Transportation Needs: Not on file  Physical Activity: Not on file  Stress: Not on file  Social Connections: Not on file  Intimate Partner Violence: Not on file    Family History  Problem Relation Age of Onset   Diabetes Mellitus II Sister    Stroke Mother    Diabetes Mellitus II Mother    Prostate cancer Father    Diabetes Mellitus II Brother    Breast cancer Maternal Aunt    Diabetes Maternal Aunt    Colon cancer Neg Hx    Esophageal cancer Neg Hx    Stomach cancer Neg Hx    Pancreatic cancer Neg Hx    Liver disease Neg Hx     Current Outpatient Medications  Medication Sig Dispense Refill   amLODipine (NORVASC) 10 MG tablet TAKE 1 TABLET (10 MG TOTAL)  BY MOUTH DAILY. 90 tablet 3   anastrozole (ARIMIDEX) 1 MG tablet TAKE 1 TABLET BY MOUTH ONCE DAILY 90 tablet 3   Artificial Tear Ointment (DRY EYES OP) Place 1 drop into both eyes daily as needed (Dry eyes).     aspirin 81 MG tablet Take 1 tablet (81 mg total) by mouth daily. 100 tablet 2   blood glucose meter kit and supplies KIT Dispense based on patient and insurance preference. Use up to four times daily as directed. (FOR ICD-9 250.00, 250.01). 1 each 0   CALCIUM PO Take 15 mLs by mouth daily.     clopidogrel (PLAVIX) 75 MG tablet TAKE 1 TABLET (75 MG TOTAL) BY MOUTH DAILY. 30 tablet 6   Dulaglutide (TRULICITY) 8.11 XB/2.6OM SOPN Inject 0.75 mg into the skin once a week. 2 mL 5   Elderberry 575 MG/5ML SYRP Take 15 mLs by mouth daily as needed.      Insulin Pen Needle (TRUEPLUS PEN NEEDLES) 32G X 4 MM MISC Use as directed to inject insulin 100 each 3   LANTUS SOLOSTAR 100 UNIT/ML Solostar Pen INJECT 10 UNITS INTO THE SKIN 2 (TWO) TIMES DAILY. 15 mL 6   lisinopril (ZESTRIL) 10 MG tablet Take 1 tablet (10 mg total) by mouth daily. 90 tablet 3   metFORMIN (GLUCOPHAGE) 1000 MG tablet TAKE 1 TABLET (1,000 MG TOTAL) BY MOUTH 2 (TWO) TIMES DAILY WITH A MEAL. 180 tablet 2   rosuvastatin (CRESTOR) 40 MG tablet TAKE 1 TABLET (40 MG TOTAL) BY MOUTH DAILY. 90 tablet 2   No current facility-administered medications for this visit.    No Known Allergies   REVIEW OF SYSTEMS:  [X]  denotes positive finding, [ ]  denotes negative finding Cardiac  Comments:  Chest pain or chest pressure:    Shortness of breath upon exertion:    Short of breath when lying flat:    Irregular heart rhythm:        Vascular    Pain in calf, thigh, or hip brought on by ambulation:    Pain in feet at night that wakes you up from your sleep:     Blood clot in your veins:    Leg swelling:         Pulmonary    Oxygen at home:    Productive cough:     Wheezing:         Neurologic    Sudden weakness in arms or legs:      Sudden numbness in arms or legs:     Sudden onset  of difficulty speaking or slurred speech:    Temporary loss of vision in one eye:     Problems with dizziness:         Gastrointestinal    Blood in stool:     Vomited blood:         Genitourinary    Burning when urinating:     Blood in urine:        Psychiatric    Major depression:         Hematologic    Bleeding problems:    Problems with blood clotting too easily:        Skin    Rashes or ulcers:        Constitutional    Fever or chills:      PHYSICAL EXAMINATION:  Vitals:   10/28/20 1044  BP: (!) 146/75  Pulse: 83  Temp: 97.7 F (36.5 C)  TempSrc: Skin  SpO2: 99%  Weight: 189 lb 3.2 oz (85.8 kg)  Height: 5' 5"  (1.651 m)    General:  WDWN in NAD; vital signs documented above Gait: Normal HENT: WNL, normocephalic Pulmonary: normal non-labored breathing , without wheezing Cardiac: regular HR, without  Murmurs without carotid bruit Vascular Exam/Pulses:2+ radial pulses bilaterally, lower extremities well perfused and warm with palpable pedal pulses Extremities: without ischemic changes, without Gangrene , without cellulitis; without open wounds;  Musculoskeletal: no muscle wasting or atrophy  Neurologic: A&O X 3;  No focal weakness or paresthesias are detected Psychiatric:  The pt has Normal affect.   Non-Invasive Vascular Imaging:   VAS US Carotid: 10/28/20 Summary:  Right Carotid: Velocities in the right ICA are consistent with a 1-39% stenosis.   Left Carotid: Patent stent with no stenosis noted.   Vertebrals:  Bilateral vertebral arteries demonstrate antegrade flow.  Subclavians: Normal flow hemodynamics were seen in bilateral subclavian arteries.    ASSESSMENT/PLAN:: 65 y.o. female here for follow up for carotid artery stenosis. She has history of Left TCAR by Dr. Oneida Alar in 2019. She remains neurologically intact without any associated symptoms. Her duplex today shows 1-39% right ICA stenosis and  left ICA without stenosis. Stent patent. Normal vertebral antegrade flow and no hemodynamically significant stenosis in subclavian arteries bilaterally.  - Continue Aspirin, statin and plavix -Reviewed signs and symptoms of TIA/ Stroke and she understands to go to ER if these should occur - She will follow up in 1 year with repeat carotid duplex   Karoline Caldwell, PA-C [facility] [phone]  Clinic MD:   Dr. Virl Cagey

## 2020-11-07 ENCOUNTER — Other Ambulatory Visit: Payer: Self-pay

## 2020-11-07 MED FILL — Metformin HCl Tab 1000 MG: ORAL | 90 days supply | Qty: 180 | Fill #2 | Status: AC

## 2020-11-07 MED FILL — Rosuvastatin Calcium Tab 40 MG: ORAL | 90 days supply | Qty: 90 | Fill #2 | Status: CN

## 2020-11-07 MED FILL — Metformin HCl Tab 1000 MG: ORAL | 90 days supply | Qty: 180 | Fill #2 | Status: CN

## 2020-11-07 MED FILL — Rosuvastatin Calcium Tab 40 MG: ORAL | 90 days supply | Qty: 90 | Fill #2 | Status: AC

## 2020-11-08 ENCOUNTER — Other Ambulatory Visit: Payer: Self-pay

## 2020-11-21 ENCOUNTER — Other Ambulatory Visit: Payer: Self-pay

## 2020-11-21 ENCOUNTER — Other Ambulatory Visit: Payer: Self-pay | Admitting: Internal Medicine

## 2020-11-21 DIAGNOSIS — Z8673 Personal history of transient ischemic attack (TIA), and cerebral infarction without residual deficits: Secondary | ICD-10-CM

## 2020-11-21 DIAGNOSIS — I779 Disorder of arteries and arterioles, unspecified: Secondary | ICD-10-CM

## 2020-11-21 MED FILL — Insulin Glargine Soln Pen-Injector 100 Unit/ML: SUBCUTANEOUS | 75 days supply | Qty: 15 | Fill #2 | Status: AC

## 2020-11-21 NOTE — Telephone Encounter (Signed)
Requested medication (s) are due for refill today: Yes  Requested medication (s) are on the active medication list: Yes  Last refill:  03/11/2020  Future visit scheduled: Yes tomorrow  Notes to clinic:  Unable to refill per protocol due to failed labs, no updated results.      Requested Prescriptions  Pending Prescriptions Disp Refills   clopidogrel (PLAVIX) 75 MG tablet 30 tablet 6    Sig: TAKE 1 TABLET (75 MG TOTAL) BY MOUTH DAILY.     Hematology: Antiplatelets - clopidogrel Failed - 11/21/2020  9:37 AM      Failed - Evaluate AST, ALT within 2 months of therapy initiation.      Failed - HCT in normal range and within 180 days    Hematocrit  Date Value Ref Range Status  04/01/2020 40.3 34.0 - 46.6 % Final          Failed - HGB in normal range and within 180 days    Hemoglobin  Date Value Ref Range Status  04/01/2020 13.4 11.1 - 15.9 g/dL Final          Failed - PLT in normal range and within 180 days    Platelets  Date Value Ref Range Status  04/01/2020 295 150 - 450 x10E3/uL Final          Passed - ALT in normal range and within 360 days    ALT  Date Value Ref Range Status  04/01/2020 16 0 - 32 IU/L Final  03/10/2019 21 0 - 44 U/L Final          Passed - AST in normal range and within 360 days    AST  Date Value Ref Range Status  04/01/2020 15 0 - 40 IU/L Final  03/10/2019 13 (L) 15 - 41 U/L Final          Passed - Valid encounter within last 6 months    Recent Outpatient Visits           1 month ago Type 2 diabetes mellitus with obesity [facility])   [place], [place] L, RPH-CPP   3 months ago Type 2 diabetes mellitus with obesity [facility])   [place], Annie [place] L, RPH-CPP   4 months ago Type 2 diabetes mellitus with obesity [facility])   [place] [name] B, MD   8 months ago Type 2 diabetes mellitus with other circulatory  complication, with long-term current use of insulin [facility])   [place] [name], MD   12 months ago Encounter for [employer] annual wellness exam   [place], MD       Future Appointments             Tomorrow [name], MD [place]

## 2020-11-22 ENCOUNTER — Other Ambulatory Visit: Payer: Self-pay

## 2020-11-22 ENCOUNTER — Ambulatory Visit: Payer: Medicare Other | Attending: Internal Medicine | Admitting: Internal Medicine

## 2020-11-22 ENCOUNTER — Encounter: Payer: Self-pay | Admitting: Internal Medicine

## 2020-11-22 VITALS — BP 137/86 | HR 78 | Resp 16 | Wt 190.6 lb

## 2020-11-22 DIAGNOSIS — E1169 Type 2 diabetes mellitus with other specified complication: Secondary | ICD-10-CM | POA: Diagnosis not present

## 2020-11-22 DIAGNOSIS — E785 Hyperlipidemia, unspecified: Secondary | ICD-10-CM | POA: Diagnosis not present

## 2020-11-22 DIAGNOSIS — E669 Obesity, unspecified: Secondary | ICD-10-CM | POA: Diagnosis not present

## 2020-11-22 DIAGNOSIS — I10 Essential (primary) hypertension: Secondary | ICD-10-CM

## 2020-11-22 DIAGNOSIS — Z23 Encounter for immunization: Secondary | ICD-10-CM

## 2020-11-22 DIAGNOSIS — Z6831 Body mass index (BMI) 31.0-31.9, adult: Secondary | ICD-10-CM

## 2020-11-22 LAB — GLUCOSE, POCT (MANUAL RESULT ENTRY): POC Glucose: 173 mg/dl — AB (ref 70–99)

## 2020-11-22 MED ORDER — CLOPIDOGREL BISULFATE 75 MG PO TABS
ORAL_TABLET | Freq: Every day | ORAL | 6 refills | Status: DC
  Filled 2020-11-22: qty 30, 30d supply, fill #0
  Filled 2020-12-23: qty 30, 30d supply, fill #1
  Filled 2021-01-17 – 2021-01-18 (×2): qty 30, 30d supply, fill #2
  Filled 2021-02-13: qty 30, 30d supply, fill #3
  Filled 2021-02-13: qty 30, 30d supply, fill #0
  Filled 2021-03-22: qty 30, 30d supply, fill #1
  Filled 2021-04-17: qty 30, 30d supply, fill #2
  Filled 2021-05-15: qty 30, 30d supply, fill #3

## 2020-11-22 NOTE — Progress Notes (Signed)
Patient ID: Sandra Shelton, female    DOB: 1955/12/16  MRN: [id number]  CC: Hypertension and Diabetes   Subjective: Sandra Shelton is a 65 y.o. 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  Tolerating the Trulicity.  She has seen the clinical pharmacist a few times since last visit with me.  Her A1c has come down. Medication side effects:  [] Yes    [x] No Home Monitoring?  [x] Yes twice a day   [] No Home glucose results range:109-130 Diet Adherence: [x] Yes -appetite decreased.  Down 6 lbs since starting Trulicity on last visit Exercise: [x] Yes -does some jumping jacks every morning.  Thinking about joining a gym close to her house Hypoglycemic episodes?: [] Yes    [x] No Numbness of the feet? [] Yes    [x] No Retinopathy hx? [] Yes    [] No Last eye exam: has appt 11/24/20 at [facility].  Comments:   HYPERTENSION/CAS Currently taking: see medication list - Norvasc, Lisinopril, Crestor, ASA and Plavix Med Adherence: [x] Yes    [] No Medication side effects: [] Yes    [x] No Adherence with salt restriction: [x] Yes    [] No Home Monitoring?: [x] Yes    [] No Monitoring Frequency: every day in the afternoon Home BP results range: 120-130/70s SOB? [] Yes    [x] No Chest Pain?: [] Yes    [x] No Leg swelling?: [] Yes    [x] No Headaches?: [] Yes    [x] No Dizziness? [] Yes    [x] No Comments: saw vascular PA last mth for f/u CAS.  US revealed stable 1-39%  stenosis RT, LT side looks good. HL:  taking and tolerating Crestor.   Patient Active Problem List   Diagnosis Date Noted   Advance directive discussed with patient 11/24/2019   Obesity (BMI 30-39.9) 04/21/2019   Port-A-Cath in place 05/06/2018   Malignant neoplasm of upper-outer quadrant of right breast in female, estrogen receptor positive ([place]) 02/10/2018   Hypercalcemia 12/09/2017   Diabetes mellitus type II, controlled ([place]) 09/02/2017   Essential hypertension 09/02/2017   Left-sided carotid artery disease ([place]) 09/02/2017   Pre-ulcerative corn or callous 52/77/8242   Embolic stroke involving left carotid artery ([place]) 07/24/2017   Cerebral thrombosis with cerebral infarction 07/08/2017     Current Outpatient Medications on File Prior to Visit  Medication Sig Dispense Refill   amLODipine (NORVASC) 10 MG tablet TAKE 1 TABLET (10 MG TOTAL) BY MOUTH DAILY. 90 tablet 3   anastrozole (ARIMIDEX) 1 MG tablet TAKE 1 TABLET BY MOUTH ONCE DAILY 90 tablet 3   Artificial Tear Ointment (DRY EYES OP) Place 1 drop into both eyes daily as needed (Dry eyes).     aspirin 81 MG tablet Take 1 tablet (81 mg total) by mouth daily. 100 tablet 2   blood glucose meter kit and supplies KIT  Dispense based on patient and insurance preference. Use up to four times daily as directed. (FOR ICD-9 250.00, 250.01). 1 each 0   CALCIUM PO Take 15 mLs by mouth daily.     clopidogrel (PLAVIX) 75 MG tablet TAKE 1 TABLET (75 MG TOTAL) BY MOUTH DAILY. 30 tablet 6   Dulaglutide (TRULICITY) 2.80 KL/4.9ZP SOPN Inject 0.75 mg into the skin once a week. 2 mL 5   Elderberry 575 MG/5ML SYRP Take 15 mLs by mouth daily as needed.      Insulin Pen Needle (TRUEPLUS PEN NEEDLES) 32G X 4 MM MISC Use as directed to inject insulin 100 each 3   LANTUS SOLOSTAR 100 UNIT/ML Solostar Pen INJECT 10 UNITS INTO THE SKIN 2 (TWO) TIMES DAILY. 15 mL 6   lisinopril (ZESTRIL) 10 MG tablet Take 1 tablet (10 mg total) by mouth daily. 90 tablet 3    metFORMIN (GLUCOPHAGE) 1000 MG tablet TAKE 1 TABLET (1,000 MG TOTAL) BY MOUTH 2 (TWO) TIMES DAILY WITH A MEAL. 180 tablet 2   rosuvastatin (CRESTOR) 40 MG tablet TAKE 1 TABLET (40 MG TOTAL) BY MOUTH DAILY. 90 tablet 2   No current facility-administered medications on file prior to visit.    No Known Allergies  Social History   Socioeconomic History   Marital status: Single    Spouse name: Not on file   Number of children: Not on file   Years of education: Not on file   Highest education level: Not on file  Occupational History   Not on file  Tobacco Use   Smoking status: Never   Smokeless tobacco: Never  Vaping Use   Vaping Use: Never used  Substance and Sexual Activity   Alcohol use: No   Drug use: No   Sexual activity: Not on file  Other Topics Concern   Not on file  Social History Narrative   Lives home alone.  Single.  Education Some college.  No children.     Social Determinants of Health   Financial Resource Strain: Not on file  Food Insecurity: Not on file  Transportation Needs: Not on file  Physical Activity: Not on file  Stress: Not on file  Social Connections: Not on file  Intimate Partner Violence: Not on file    Family History  Problem Relation Age of Onset   Diabetes Mellitus II Sister    Stroke Mother    Diabetes Mellitus II Mother    Prostate cancer Father    Diabetes Mellitus II Brother    Breast cancer Maternal Aunt    Diabetes Maternal Aunt    Colon cancer Neg Hx    Esophageal cancer Neg Hx    Stomach cancer Neg Hx    Pancreatic cancer Neg Hx    Liver disease Neg Hx     Past Surgical History:  Procedure Laterality Date   BREAST EXCISIONAL BIOPSY Right    BREAST LUMPECTOMY Right 03/2018   BREAST LUMPECTOMY WITH RADIOACTIVE SEED AND SENTINEL LYMPH NODE BIOPSY Right 03/24/2018   Procedure: RIGHT BREAST LUMPECTOMY WITH RADIOACTIVE SEED AND RIGHT AXILLARY DEEP SENTINEL LYMPH NODE BIOPSY INJECT BLUE DYE RIGHT BREAST;  Surgeon: Fanny Skates,  MD;  Location: [place];  Service: General;  Laterality: Right;   COLONOSCOPY     PORT-A-CATH REMOVAL N/A 05/19/2019   Procedure: PORT REMOVAL;  Surgeon: Donnie Mesa, MD;  Location: [place];  Service: General;  Laterality: N/A;   PORTACATH PLACEMENT N/A 04/09/2018   Procedure: INSERTION PORT-A-CATH WITH ULTRASOUND;  Surgeon: Fanny Skates, MD;  Location: [place];  Service: General;  Laterality: N/A;   TRANSCAROTID ARTERY REVASCULARIZATION  Left 07/24/2017   Procedure: TRANSCAROTID ARTERY REVASCULARIZATION, left;  Surgeon: Elam Dutch, MD;  Location: [place];  Service: Vascular;  Laterality: Left;   UTERINE FIBROID SURGERY     2005    ROS: Review of Systems Negative except as stated above  PHYSICAL EXAM: BP 137/86   Pulse 78   Resp 16   Wt 190 lb 9.6 oz (86.5 kg)   LMP  (LMP Unknown)   SpO2 97%   BMI 31.72 kg/m   Wt Readings from Last 3 Encounters:  11/22/20 190 lb 9.6 oz (86.5 kg)  10/28/20 189 lb 3.2 oz (85.8 kg)  09/13/20 189 lb 1.6 oz (85.8 kg)   BP 143/77 Physical Exam  General appearance - alert, well appearing, and in no distress Mental status - normal mood, behavior, speech, dress, motor activity, and thought processes Mouth - mucous membranes moist, pharynx normal without lesions Neck - supple, no significant adenopathy Chest - clear to auscultation, no wheezes, rales or rhonchi, symmetric air entry Heart - normal rate, regular rhythm, normal S1, S2, no murmurs, rubs, clicks or gallops Extremities - peripheral pulses normal, no pedal edema, no clubbing or cyanosis Diabetic Foot Exam - Simple   Simple Foot Form Visual Inspection See comments: Yes Sensation Testing Intact to touch and monofilament testing bilaterally: Yes Pulse Check Posterior Tibialis and Dorsalis pulse intact bilaterally: Yes Comments Shaved calluses on the ball of the first and fifth toes BL      CMP Latest Ref Rng & Units 04/01/2020 05/19/2019  03/10/2019  Glucose 65 - 99 mg/dL 142(H) 166(H) 347(H)  BUN 8 - 27 mg/dL _0 Creatinine 0.57 - 1.00 mg/dL 0.75 0.74 1.04(H)  Sodium 134 - 144 mmol/L 140 138 137  Potassium 3.5 - 5.2 mmol/L 4.2 3.9 4.0  Chloride 96 - 106 mmol/L 101 108 104  CO2 20 - 29 mmol/L 19(L) 22 22  Calcium 8.7 - 10.3 mg/dL 10.4(H) 9.8 9.6  Total Protein 6.0 - 8.5 g/dL 7.8 7.7 7.6  Total Bilirubin 0.0 - 1.2 mg/dL <0.2 0.4 <0.2(L)  Alkaline Phos 44 - 121 IU/L 66 52 58  AST 0 - 40 IU/L 15 18 13(L)  ALT 0 - 32 IU/L _1 Lipid Panel     Component Value Date/Time   CHOL 151 04/01/2020 0836   TRIG 112 04/01/2020 0836   HDL 47 04/01/2020 0836   CHOLHDL 3.2 04/01/2020 0836   CHOLHDL 8.2 07/09/2017 0646   VLDL 76 (H) 07/09/2017 0646   LDLCALC 84 04/01/2020 0836    CBC    Component Value Date/Time   WBC 6.6 04/01/2020 0836   WBC 6.1 05/19/2019 0730   RBC 4.81 04/01/2020 0836   RBC 4.72 05/19/2019 0730   HGB 13.4 04/01/2020 0836   HCT 40.3 04/01/2020 0836   PLT 295 04/01/2020 0836   MCV 84 04/01/2020 0836   MCH 27.9 04/01/2020 0836   MCH 27.5 05/19/2019 0730   MCHC 33.3 04/01/2020 0836   MCHC 31.9 05/19/2019 0730   RDW 13.0 04/01/2020 0836   LYMPHSABS 1.5 03/10/2019 0845   MONOABS 0.4 03/10/2019 0845   EOSABS 0.1 03/10/2019 0845   BASOSABS 0.0 03/10/2019 0845    ASSESSMENT AND PLAN: 1. Type 2 diabetes mellitus with obesity ([place]) Close to goal. Commended her on wgh loss so far.  She will look  into gym membership Continue healthy eating habits and current meds - POCT glucose (manual entry)  2. Essential hypertension Close to goal.  She reports home blood pressure readings have been good.  She will continue amlodipine and lisinopril.  3. Need for influenza vaccination Flu shot given today.  She will return in 2 weeks to get first Shingrix vaccine.  4. Hyperlipidemia associated with type 2 diabetes mellitus ([place]) Continue Crestor.     Patient was given the opportunity to ask  questions.  Patient verbalized understanding of the plan and was able to repeat key elements of the plan.   Orders Placed This Encounter  Procedures   POCT glucose (manual entry)     Requested Prescriptions    No prescriptions requested or ordered in this encounter    Return in about 4 years (around 11/22/2024) for Give appt with [facility] in 1-2 wks for 1st Shingrix shot.  [name], MD, FACP

## 2020-12-06 ENCOUNTER — Other Ambulatory Visit: Payer: Self-pay

## 2020-12-07 ENCOUNTER — Other Ambulatory Visit: Payer: Self-pay

## 2020-12-07 ENCOUNTER — Ambulatory Visit: Payer: Medicare Other | Attending: Internal Medicine | Admitting: Pharmacist

## 2020-12-07 DIAGNOSIS — Z23 Encounter for immunization: Secondary | ICD-10-CM

## 2020-12-07 NOTE — Progress Notes (Signed)
Patient presents for vaccination against zoster per orders of Dr. Johnson. Consent given. Counseling provided. No contraindications exists. Vaccine administered without incident.   Luke Van Ausdall, PharmD, BCACP, CPP Clinical Pharmacist [facility] [phone]  

## 2020-12-23 ENCOUNTER — Other Ambulatory Visit: Payer: Self-pay

## 2020-12-23 ENCOUNTER — Other Ambulatory Visit: Payer: Self-pay | Admitting: Internal Medicine

## 2020-12-23 DIAGNOSIS — I10 Essential (primary) hypertension: Secondary | ICD-10-CM

## 2020-12-23 MED ORDER — LISINOPRIL 10 MG PO TABS
10.0000 mg | ORAL_TABLET | Freq: Every day | ORAL | 1 refills | Status: DC
  Filled 2020-12-23 – 2021-04-17 (×2): qty 90, 90d supply, fill #0

## 2020-12-23 MED FILL — Amlodipine Besylate Tab 10 MG (Base Equivalent): ORAL | 90 days supply | Qty: 90 | Fill #2 | Status: AC

## 2020-12-26 ENCOUNTER — Other Ambulatory Visit: Payer: Self-pay

## 2021-01-02 ENCOUNTER — Other Ambulatory Visit: Payer: Self-pay | Admitting: Internal Medicine

## 2021-01-02 ENCOUNTER — Other Ambulatory Visit: Payer: Self-pay

## 2021-01-02 DIAGNOSIS — E1169 Type 2 diabetes mellitus with other specified complication: Secondary | ICD-10-CM

## 2021-01-02 MED ORDER — TRULICITY 0.75 MG/0.5ML ~~LOC~~ SOAJ
0.7500 mg | SUBCUTANEOUS | 5 refills | Status: DC
  Filled 2021-01-02 – 2021-01-31 (×2): qty 2, 28d supply, fill #0
  Filled 2021-01-31 – 2021-02-27 (×2): qty 2, 28d supply, fill #1

## 2021-01-03 ENCOUNTER — Other Ambulatory Visit: Payer: Self-pay

## 2021-01-17 ENCOUNTER — Other Ambulatory Visit: Payer: Self-pay

## 2021-01-17 ENCOUNTER — Other Ambulatory Visit: Payer: Self-pay | Admitting: Internal Medicine

## 2021-01-17 DIAGNOSIS — Z794 Long term (current) use of insulin: Secondary | ICD-10-CM

## 2021-01-17 DIAGNOSIS — E1159 Type 2 diabetes mellitus with other circulatory complications: Secondary | ICD-10-CM

## 2021-01-18 ENCOUNTER — Other Ambulatory Visit: Payer: Self-pay

## 2021-01-31 ENCOUNTER — Other Ambulatory Visit: Payer: Self-pay

## 2021-01-31 ENCOUNTER — Other Ambulatory Visit: Payer: Self-pay | Admitting: Internal Medicine

## 2021-01-31 DIAGNOSIS — Z794 Long term (current) use of insulin: Secondary | ICD-10-CM

## 2021-01-31 DIAGNOSIS — E1159 Type 2 diabetes mellitus with other circulatory complications: Secondary | ICD-10-CM

## 2021-01-31 MED ORDER — ROSUVASTATIN CALCIUM 40 MG PO TABS
ORAL_TABLET | Freq: Every day | ORAL | 0 refills | Status: DC
  Filled 2021-01-31: qty 90, 90d supply, fill #0

## 2021-01-31 MED ORDER — METFORMIN HCL 1000 MG PO TABS
ORAL_TABLET | Freq: Two times a day (BID) | ORAL | 0 refills | Status: DC
  Filled 2021-01-31: qty 180, 90d supply, fill #0

## 2021-01-31 NOTE — Telephone Encounter (Signed)
Requested Prescriptions  Pending Prescriptions Disp Refills   rosuvastatin (CRESTOR) 40 MG tablet 90 tablet 0    Sig: TAKE 1 TABLET (40 MG TOTAL) BY MOUTH DAILY.     Cardiovascular:  Antilipid - Statins Passed - 01/31/2021  8:49 AM      Passed - Total Cholesterol in normal range and within 360 days    Cholesterol, Total  Date Value Ref Range Status  04/01/2020 151 100 - 199 mg/dL Final         Passed - LDL in normal range and within 360 days    LDL Chol Calc (NIH)  Date Value Ref Range Status  04/01/2020 84 0 - 99 mg/dL Final         Passed - HDL in normal range and within 360 days    HDL  Date Value Ref Range Status  04/01/2020 47 >39 mg/dL Final         Passed - Triglycerides in normal range and within 360 days    Triglycerides  Date Value Ref Range Status  04/01/2020 112 0 - 149 mg/dL Final         Passed - Patient is not pregnant      Passed - Valid encounter within last 12 months    Recent Outpatient Visits          1 month ago Need for vaccination for zoster   [place], Annie Main L, RPH-CPP   2 months ago Type 2 diabetes mellitus with obesity ([place])   [place] [name] B, MD   4 months ago Type 2 diabetes mellitus with obesity [facility])   [place], Annie Main L, RPH-CPP   5 months ago Type 2 diabetes mellitus with obesity [facility])   [place], Annie Main L, RPH-CPP   6 months ago Type 2 diabetes mellitus with obesity [facility])   [place], Deborah B, MD      Future Appointments            In 6 days Tresa Endo, [place]   In 1 month [name], MD [place]            metFORMIN (GLUCOPHAGE) 1000 MG tablet 180 tablet 0    Sig: TAKE 1 TABLET (1,000 MG TOTAL) BY MOUTH 2  (TWO) TIMES DAILY WITH A MEAL.     Endocrinology:  Diabetes - Biguanides Failed - 01/31/2021  8:49 AM      Failed - AA eGFR in normal range and within 360 days    GFR, Est AFR Am  Date Value Ref Range Status  03/10/2019 >60 >60 mL/min Final   GFR calc Af Amer  Date Value Ref Range Status  05/19/2019 >60 >60 mL/min Final   GFR, Estimated  Date Value Ref Range Status  03/10/2019 56 (L) >60 mL/min Final   GFR calc non Af Amer  Date Value Ref Range Status  05/19/2019 >60 >60 mL/min Final   eGFR  Date Value Ref Range Status  04/01/2020 87 >59 mL/min/1.73 Final         Passed - Cr in normal range and within 360 days    Creatinine  Date Value Ref Range Status  03/10/2019 1.04 (H) 0.44 - 1.00 mg/dL Final   Creatinine, Ser  Date Value Ref  Range Status  04/01/2020 0.75 0.57 - 1.00 mg/dL Final         Passed - HBA1C is between 0 and 7.9 and within 180 days    HbA1c, POC (controlled diabetic range)  Date Value Ref Range Status  07/22/2020 8.0 (A) 0.0 - 7.0 % Final   Hgb A1c MFr Bld  Date Value Ref Range Status  10/17/2020 7.1 (H) 4.8 - 5.6 % Final    Comment:             Prediabetes: 5.7 - 6.4          Diabetes: >6.4          Glycemic control for adults with diabetes: <7.0          Passed - Valid encounter within last 6 months    Recent Outpatient Visits          1 month ago Need for vaccination for zoster   [place], Annie Main L, RPH-CPP   2 months ago Type 2 diabetes mellitus with obesity [facility])   [place] [name] B, MD   4 months ago Type 2 diabetes mellitus with obesity [facility])   [place], Annie Main L, RPH-CPP   5 months ago Type 2 diabetes mellitus with obesity [facility])   [place], Annie Main L, RPH-CPP   6 months ago Type 2 diabetes mellitus with obesity [facility])   [place], Deborah B, MD      Future Appointments            In 6 days Daisy Blossom, Jarome Matin, [place]   In 1 month Wynetta Emery, Dalbert Batman, MD [place]

## 2021-02-06 ENCOUNTER — Other Ambulatory Visit: Payer: Self-pay

## 2021-02-06 ENCOUNTER — Ambulatory Visit: Payer: Medicare Other | Attending: Internal Medicine | Admitting: Pharmacist

## 2021-02-06 DIAGNOSIS — Z23 Encounter for immunization: Secondary | ICD-10-CM | POA: Diagnosis not present

## 2021-02-06 NOTE — Progress Notes (Signed)
Patient presents for vaccination against zoster per orders of Dr. Johnson. Consent given. Counseling provided. No contraindications exists. Vaccine administered without incident.   Luke Van Ausdall, PharmD, BCACP, CPP Clinical Pharmacist [facility] [phone]  

## 2021-02-13 ENCOUNTER — Other Ambulatory Visit: Payer: Self-pay

## 2021-02-15 ENCOUNTER — Other Ambulatory Visit: Payer: Self-pay

## 2021-02-27 ENCOUNTER — Other Ambulatory Visit: Payer: Self-pay

## 2021-02-27 MED FILL — Insulin Glargine Soln Pen-Injector 100 Unit/ML: SUBCUTANEOUS | 75 days supply | Qty: 15 | Fill #0 | Status: AC

## 2021-02-27 MED FILL — Insulin Glargine Soln Pen-Injector 100 Unit/ML: SUBCUTANEOUS | 75 days supply | Qty: 15 | Fill #3 | Status: CN

## 2021-03-03 ENCOUNTER — Other Ambulatory Visit: Payer: Self-pay | Admitting: Internal Medicine

## 2021-03-03 DIAGNOSIS — Z1231 Encounter for screening mammogram for malignant neoplasm of breast: Secondary | ICD-10-CM

## 2021-03-22 ENCOUNTER — Other Ambulatory Visit: Payer: Self-pay

## 2021-03-23 ENCOUNTER — Encounter: Payer: Self-pay | Admitting: Internal Medicine

## 2021-03-23 ENCOUNTER — Other Ambulatory Visit: Payer: Self-pay

## 2021-03-23 ENCOUNTER — Ambulatory Visit: Payer: Medicare Other | Attending: Internal Medicine | Admitting: Internal Medicine

## 2021-03-23 VITALS — BP 151/77 | HR 72 | Ht 65.0 in | Wt 190.2 lb

## 2021-03-23 DIAGNOSIS — I779 Disorder of arteries and arterioles, unspecified: Secondary | ICD-10-CM

## 2021-03-23 DIAGNOSIS — E669 Obesity, unspecified: Secondary | ICD-10-CM

## 2021-03-23 DIAGNOSIS — Z1159 Encounter for screening for other viral diseases: Secondary | ICD-10-CM

## 2021-03-23 DIAGNOSIS — R053 Chronic cough: Secondary | ICD-10-CM | POA: Diagnosis not present

## 2021-03-23 DIAGNOSIS — Z853 Personal history of malignant neoplasm of breast: Secondary | ICD-10-CM

## 2021-03-23 DIAGNOSIS — I10 Essential (primary) hypertension: Secondary | ICD-10-CM

## 2021-03-23 DIAGNOSIS — E1169 Type 2 diabetes mellitus with other specified complication: Secondary | ICD-10-CM | POA: Diagnosis not present

## 2021-03-23 DIAGNOSIS — E785 Hyperlipidemia, unspecified: Secondary | ICD-10-CM

## 2021-03-23 LAB — POCT GLYCOSYLATED HEMOGLOBIN (HGB A1C): Hemoglobin A1C: 7 % — AB (ref 4.0–5.6)

## 2021-03-23 LAB — GLUCOSE, POCT (MANUAL RESULT ENTRY): POC Glucose: 187 mg/dl — AB (ref 70–99)

## 2021-03-23 MED ORDER — TRULICITY 1.5 MG/0.5ML ~~LOC~~ SOAJ
1.5000 mg | SUBCUTANEOUS | 5 refills | Status: DC
Start: 2021-03-23 — End: 2021-09-05
  Filled 2021-03-23: qty 2, 28d supply, fill #0
  Filled 2021-04-17: qty 2, 28d supply, fill #1
  Filled 2021-05-22: qty 2, 28d supply, fill #2
  Filled 2021-06-19: qty 2, 28d supply, fill #3
  Filled 2021-07-11: qty 2, 28d supply, fill #4
  Filled 2021-08-07: qty 2, 28d supply, fill #5

## 2021-03-23 NOTE — Patient Instructions (Signed)
Try to get in 30 minutes of moderate intensity exercise at least 3 to 5 days a week. ? ?Please check your blood pressure at least twice a week and bring in your readings when you come to see the clinical pharmacist in 3 weeks. ? ?Increase Trulicity to 1.5 mg once a week.  Decrease the Lantus insulin to 3 units.  If after 2 to 3 weeks your blood sugars before meals are staying between 90-130, you can discontinue the Lantus insulin. ?

## 2021-03-23 NOTE — Progress Notes (Signed)
Patient ID: Sandra Shelton, female    DOB: Aug 27, 1955  MRN: [id number]  CC: Follow-up (4 follow up , some coughing  and nasal congestion )   Subjective: Sandra Shelton is a 66 y.o. 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 No postnasal drip.   No fever or SOB.  No sick contacts recently Uses OTC cough syrup PRN  HYPERTENSION/CAS Currently taking: see medication list.  She endorses taking Norvasc, lisinopril, Crestor, aspirin and Plavix Med Adherence: _0  Yes    _1  No Medication side effects: _2  Yes    _3  No Adherence with salt restriction: _4  Yes    _5  No Home Monitoring?: _6  Yes    _7  No Monitoring Frequency: last checked about 1 wk ago. Home BP results range: 132-135/70s SOB? _8  Yes    _9  No Chest Pain?: _10  Yes    _11  No Leg swelling?: _12  Yes    _13  No Headaches?: _14  Yes when BP elev   _15  No Dizziness? _16  Yes    _17  No Comments:   DM/obesity: Results for orders placed or performed in visit on 03/23/21  Glucose (CBG)  Result Value Ref Range   POC Glucose 187 (A) 70 - 99 mg/dl  HgB A1c  Result Value Ref Range   Hemoglobin A1C 7.0 (A) 4.0 - 5.6 %   HbA1c POC (<> result, manual entry)     HbA1c, POC (prediabetic range)     HbA1c, POC (controlled diabetic range)     Compliant with meds:  Trulicity, Meformin and Lantus 6 units daily.  Tolerating meds Checks BS BID before meals.  Gives range 90-135.  No low BS Not getting in as much exercise due to cold weather.  Did get eye exam 11/2020 at [facility]  HM: had 3 COVID shots. Due for hep C screening, agrees to have it done.  MMG schedule later this mth.  She has history of breast cancer in the right breast that was  treated.  Patient Active Problem List   Diagnosis Date Noted   Advance directive discussed with patient 11/24/2019   Obesity (BMI 30-39.9) 04/21/2019   Port-A-Cath in place 05/06/2018   Malignant neoplasm of upper-outer quadrant of right breast in female, estrogen receptor positive ([place]) 02/10/2018   Hypercalcemia 12/09/2017   Diabetes mellitus type II, controlled ([place]) 09/02/2017   Essential hypertension 09/02/2017   Left-sided carotid artery disease ([place]) 09/02/2017   Pre-ulcerative corn or callous 40/98/1191   Embolic stroke involving left carotid artery ([place]) 07/24/2017   Cerebral thrombosis with cerebral infarction 07/08/2017     Current Outpatient Medications on File Prior to Visit  Medication Sig Dispense Refill   amLODipine (NORVASC) 10 MG tablet TAKE 1 TABLET (10 MG TOTAL) BY MOUTH DAILY. 90 tablet 3   anastrozole (ARIMIDEX) 1 MG tablet TAKE 1 TABLET BY MOUTH ONCE DAILY 90 tablet 3   Artificial Tear Ointment (DRY EYES OP) Place 1 drop into both eyes daily as needed (Dry eyes).     aspirin 81 MG tablet Take 1 tablet (81 mg total) by mouth daily. 100 tablet 2   blood glucose meter kit and supplies KIT Dispense based on patient and insurance preference. Use up to four  times daily as directed. (FOR ICD-9 250.00, 250.01). 1 each 0   CALCIUM PO Take 15 mLs by mouth daily.     clopidogrel (PLAVIX) 75 MG tablet TAKE 1 TABLET (75 MG TOTAL) BY MOUTH DAILY. 30 tablet 6   Dulaglutide (TRULICITY) 9.76 BH/4.1PF SOPN Inject 0.75 mg into the skin once a week. 2 mL 5   Elderberry 575 MG/5ML SYRP Take 15 mLs by mouth daily as needed.      Insulin Pen Needle (TRUEPLUS PEN NEEDLES) 32G X 4 MM MISC Use as directed to inject insulin 100 each 3   LANTUS SOLOSTAR 100 UNIT/ML Solostar Pen INJECT 10 UNITS INTO THE SKIN 2 (TWO) TIMES DAILY. 15 mL 6   lisinopril (ZESTRIL) 10 MG tablet Take 1 tablet (10 mg total) by mouth daily. 90 tablet 1   metFORMIN (GLUCOPHAGE) 1000 MG tablet TAKE 1 TABLET (1,000 MG  TOTAL) BY MOUTH 2 (TWO) TIMES DAILY WITH A MEAL. 180 tablet 0   rosuvastatin (CRESTOR) 40 MG tablet TAKE 1 TABLET (40 MG TOTAL) BY MOUTH DAILY. 90 tablet 0   No current facility-administered medications on file prior to visit.    No Known Allergies  Social History   Socioeconomic History   Marital status: Single    Spouse name: Not on file   Number of children: Not on file   Years of education: Not on file   Highest education level: Not on file  Occupational History   Not on file  Tobacco Use   Smoking status: Never   Smokeless tobacco: Never  Vaping Use   Vaping Use: Never used  Substance and Sexual Activity   Alcohol use: No   Drug use: No   Sexual activity: Not on file  Other Topics Concern   Not on file  Social History Narrative   Lives home alone.  Single.  Education Some college.  No children.     Social Determinants of Health   Financial Resource Strain: Not on file  Food Insecurity: Not on file  Transportation Needs: Not on file  Physical Activity: Not on file  Stress: Not on file  Social Connections: Not on file  Intimate Partner Violence: Not on file    Family History  Problem Relation Age of Onset   Diabetes Mellitus II Sister    Stroke Mother    Diabetes Mellitus II Mother    Prostate cancer Father    Diabetes Mellitus II Brother    Breast cancer Maternal Aunt    Diabetes Maternal Aunt    Colon cancer Neg Hx    Esophageal cancer Neg Hx    Stomach cancer Neg Hx    Pancreatic cancer Neg Hx    Liver disease Neg Hx     Past Surgical History:  Procedure Laterality Date   BREAST EXCISIONAL BIOPSY Right    BREAST LUMPECTOMY Right 03/2018   BREAST LUMPECTOMY WITH RADIOACTIVE SEED AND SENTINEL LYMPH NODE BIOPSY Right 03/24/2018   Procedure: RIGHT BREAST LUMPECTOMY WITH RADIOACTIVE SEED AND RIGHT AXILLARY DEEP SENTINEL LYMPH NODE BIOPSY INJECT BLUE DYE RIGHT BREAST;  Surgeon: Fanny Skates, MD;  Location: [facility];  Service: General;   Laterality: Right;   COLONOSCOPY     PORT-A-CATH REMOVAL N/A 05/19/2019   Procedure: PORT REMOVAL;  Surgeon: Donnie Mesa, MD;  Location: [facility];  Service: General;  Laterality: N/A;   PORTACATH PLACEMENT N/A 04/09/2018   Procedure: INSERTION PORT-A-CATH WITH ULTRASOUND;  Surgeon: Fanny Skates, MD;  Location: [facility];  Service: General;  Laterality: N/A;   TRANSCAROTID ARTERY REVASCULARIZATION  Left 07/24/2017   Procedure: TRANSCAROTID ARTERY REVASCULARIZATION, left;  Surgeon: Elam Dutch, MD;  Location[facility];  Service: Vascular;  Laterality: Left;   UTERINE FIBROID SURGERY     2005    ROS: Review of Systems Negative except as stated above  PHYSICAL EXAM: BP (!) 155/70    Pulse 72    Ht _0  (1.651 m)    Wt 190 lb 3.2 oz (86.3 kg)    LMP  (LMP Unknown)    SpO2 98%    BMI 31.65 kg/m   Wt Readings from Last 3 Encounters:  03/23/21 190 lb 3.2 oz (86.3 kg)  11/22/20 190 lb 9.6 oz (86.5 kg)  10/28/20 189 lb 3.2 oz (85.8 kg)    Physical Exam  General appearance - alert, well appearing,older AAF and in no distress Mental status - normal mood, behavior, speech, dress, motor activity, and thought processes Nose - normal and patent, no erythema, discharge or polyps Mouth - mucous membranes moist, pharynx normal without lesions Neck - supple, no significant adenopathy Chest - clear to auscultation, no wheezes, rales or rhonchi, symmetric air entry Heart - normal rate, regular rhythm, normal S1, S2, no murmurs, rubs, clicks or gallops Extremities - peripheral pulses normal, no pedal edema, no clubbing or cyanosis   CMP Latest Ref Rng & Units 04/01/2020 05/19/2019 03/10/2019  Glucose 65 - 99 mg/dL 142(H) 166(H) 347(H)  BUN 8 - 27 mg/dL _1 Creatinine 0.57 - 1.00 mg/dL 0.75 0.74 1.04(H)  Sodium 134 - 144 mmol/L 140 138 137  Potassium 3.5 - 5.2 mmol/L 4.2 3.9 4.0  Chloride 96 - 106 mmol/L 101 108 104  CO2 20 - 29 mmol/L 19(L) 22 22  Calcium 8.7 - 10.3 mg/dL  10.4(H) 9.8 9.6  Total Protein 6.0 - 8.5 g/dL 7.8 7.7 7.6  Total Bilirubin 0.0 - 1.2 mg/dL <0.2 0.4 <0.2(L)  Alkaline Phos 44 - 121 IU/L 66 52 58  AST 0 - 40 IU/L 15 18 13(L)  ALT 0 - 32 IU/L _2 Lipid Panel     Component Value Date/Time   CHOL 151 04/01/2020 0836   TRIG 112 04/01/2020 0836   HDL 47 04/01/2020 0836   CHOLHDL 3.2 04/01/2020 0836   CHOLHDL 8.2 07/09/2017 0646   VLDL 76 (H) 07/09/2017 0646   LDLCALC 84 04/01/2020 0836    CBC    Component Value Date/Time   WBC 6.6 04/01/2020 0836   WBC 6.1 05/19/2019 0730   RBC 4.81 04/01/2020 0836   RBC 4.72 05/19/2019 0730   HGB 13.4 04/01/2020 0836   HCT 40.3 04/01/2020 0836   PLT 295 04/01/2020 0836   MCV 84 04/01/2020 0836   MCH 27.9 04/01/2020 0836   MCH 27.5 05/19/2019 0730   MCHC 33.3 04/01/2020 0836   MCHC 31.9 05/19/2019 0730   RDW 13.0 04/01/2020 0836   LYMPHSABS 1.5 03/10/2019 0845   MONOABS 0.4 03/10/2019 0845   EOSABS 0.1 03/10/2019 0845   BASOSABS 0.0 03/10/2019 0845    ASSESSMENT AND PLAN: 1. Type 2 diabetes mellitus with obesity (HCC) A1c continues to improve. We discussed trying to get her off Lantus insulin by increasing the dose of Trulicity to 1.5 mg a week and continuing the metformin.  Once she starts the increased dose of Trulicity, patient advised to decrease Lantus from 6 units daily to 3 units daily.  Continue to check blood sugars.  Fasting blood sugars begin to fall below 90, she can stop the Lantus altogether. - Glucose (CBG) - HgB A1c - Dulaglutide (TRULICITY) 1.5 JL/5.9DI SOPN; Inject 1.5 mg into the skin once a week.  Dispense: 2 mL; Refill: 5 - CBC - Comprehensive metabolic panel - Lipid panel  2. Essential hypertension Not at goal of 130/80 or lower. Advised to check blood pressure at least twice a week and bring in readings to see the clinical pharmacist in 2 weeks. Advised that lisinopril may be causing the intermittent cough.  I recommend that we changed the lisinopril to  Cozaar but patient declined stating that the cough is not all the time and not bothersome to her.  3. Left-sided carotid artery disease, unspecified type (HCC) Continue Crestor, aspirin and Plavix  4. Hyperlipidemia associated with type 2 diabetes mellitus (HCC) Continue Crestor  5. Chronic cough See discussion under #2 above  6. History of right breast cancer Patient to keep appointment for her mammogram later this month for screening.  7. Need for hepatitis C screening test - Hepatitis C Antibody'    Patient was given the opportunity to ask questions.  Patient verbalized understanding of the plan and was able to repeat key elements of the plan.   This documentation was completed using Radio producer.  Any transcriptional errors are unintentional.  Orders Placed This Encounter  Procedures   Glucose (CBG)   HgB A1c     Requested Prescriptions    No prescriptions requested or ordered in this encounter    No follow-ups on file.  [name], MD, FACP

## 2021-03-24 ENCOUNTER — Other Ambulatory Visit: Payer: Self-pay | Admitting: Internal Medicine

## 2021-03-24 ENCOUNTER — Telehealth: Payer: Self-pay | Admitting: Internal Medicine

## 2021-03-24 ENCOUNTER — Other Ambulatory Visit: Payer: Self-pay

## 2021-03-24 LAB — LIPID PANEL
Chol/HDL Ratio: 2.9 ratio (ref 0.0–4.4)
Cholesterol, Total: 147 mg/dL (ref 100–199)
HDL: 51 mg/dL (ref >39)
LDL Chol Calc (NIH): 77 mg/dL (ref 0–99)
Triglycerides: 105 mg/dL (ref 0–149)
VLDL Cholesterol Cal: 19 mg/dL (ref 5–40)

## 2021-03-24 LAB — COMPREHENSIVE METABOLIC PANEL
ALT: 23 IU/L (ref 0–32)
AST: 19 IU/L (ref 0–40)
Albumin/Globulin Ratio: 1.6 (ref 1.2–2.2)
Albumin: 5.1 g/dL — ABNORMAL HIGH (ref 3.8–4.8)
Alkaline Phosphatase: 66 IU/L (ref 44–121)
BUN/Creatinine Ratio: 18 (ref 12–28)
BUN: 13 mg/dL (ref 8–27)
Bilirubin Total: <0.2 mg/dL (ref 0.0–1.2)
CO2: 22 mmol/L (ref 20–29)
Calcium: 11.1 mg/dL — ABNORMAL HIGH (ref 8.7–10.3)
Chloride: 103 mmol/L (ref 96–106)
Creatinine, Ser: 0.72 mg/dL (ref 0.57–1.00)
Globulin, Total: 3.1 g/dL (ref 1.5–4.5)
Glucose: 93 mg/dL (ref 70–99)
Potassium: 4.2 mmol/L (ref 3.5–5.2)
Sodium: 141 mmol/L (ref 134–144)
Total Protein: 8.2 g/dL (ref 6.0–8.5)
eGFR: 91 mL/min/{1.73_m2} (ref >59)

## 2021-03-24 LAB — CBC
Hematocrit: 41.5 % (ref 34.0–46.6)
Hemoglobin: 13.4 g/dL (ref 11.1–15.9)
MCH: 26.9 pg (ref 26.6–33.0)
MCHC: 32.3 g/dL (ref 31.5–35.7)
MCV: 83 fL (ref 79–97)
Platelets: 324 10*3/uL (ref 150–450)
RBC: 4.98 x10E6/uL (ref 3.77–5.28)
RDW: 13.6 % (ref 11.7–15.4)
WBC: 7.5 10*3/uL (ref 3.4–10.8)

## 2021-03-24 LAB — HEPATITIS C ANTIBODY: Hep C Virus Ab: NONREACTIVE

## 2021-03-24 NOTE — Progress Notes (Signed)
Let patient know that her calcium level is elevated.  Please let me know if she is taking calcium and/or vitamin D supplement.  I would like for her to return to her lab to have further testing done to reevaluate the cause for the high calcium.   ?-Liver function test normal ?-Blood cell counts normal. ?-Cholesterol levels are okay. ?-Screen for hepatitis C is negative.

## 2021-03-24 NOTE — Telephone Encounter (Signed)
Patient aware.

## 2021-03-27 ENCOUNTER — Other Ambulatory Visit: Payer: Self-pay | Admitting: Internal Medicine

## 2021-03-27 ENCOUNTER — Other Ambulatory Visit: Payer: Self-pay

## 2021-03-27 DIAGNOSIS — I10 Essential (primary) hypertension: Secondary | ICD-10-CM

## 2021-03-27 MED ORDER — AMLODIPINE BESYLATE 10 MG PO TABS
ORAL_TABLET | Freq: Every day | ORAL | 0 refills | Status: DC
  Filled 2021-03-27: qty 90, 90d supply, fill #0
  Filled 2021-03-27: qty 90, fill #0

## 2021-03-27 NOTE — Telephone Encounter (Signed)
Requested Prescriptions  ?Pending Prescriptions Disp Refills  ?? amLODipine (NORVASC) 10 MG tablet 90 tablet 3  ?  Sig: TAKE 1 TABLET (10 MG TOTAL) BY MOUTH DAILY.  ?  ? Cardiovascular: Calcium Channel Blockers 2 Failed - 03/27/2021  8:35 AM  ?  ?  Failed - Last BP in normal range  ?  BP Readings from Last 1 Encounters:  ?03/23/21 (!) 151/77  ?   ?  ?  Passed - Last Heart Rate in normal range  ?  Pulse Readings from Last 1 Encounters:  ?03/23/21 72  ?   ?  ?  Passed - Valid encounter within last 6 months  ?  Recent Outpatient Visits   ?      ? 4 days ago Type 2 diabetes mellitus with obesity ([place])  ? [place] [name], MD  ? 1 month ago Need for vaccination for zoster  ? [place], RPH-CPP  ? 3 months ago Need for vaccination for zoster  ? [place], RPH-CPP  ? 4 months ago Type 2 diabetes mellitus with obesity ([place])  ? [place] [name] B, MD  ? 5 months ago Type 2 diabetes mellitus with obesity ([place])  ? [place], RPH-CPP  ?  ?  ?Future Appointments   ?        ? In 3 weeks Daisy Blossom, Jarome Matin, [place]  ? In 2 months [name], MD [place]  ?  ? ?  ?  ?  ? ? ?

## 2021-03-28 ENCOUNTER — Ambulatory Visit: Payer: Medicare Other | Attending: Internal Medicine

## 2021-03-28 ENCOUNTER — Other Ambulatory Visit: Payer: Self-pay

## 2021-03-31 ENCOUNTER — Telehealth: Payer: Self-pay | Admitting: Hematology and Oncology

## 2021-03-31 NOTE — Telephone Encounter (Signed)
.  Called patient to schedule appointment per 3/16 inbasket, patient is aware of date and time.   ?

## 2021-04-01 ENCOUNTER — Telehealth: Payer: Self-pay | Admitting: Internal Medicine

## 2021-04-01 NOTE — Telephone Encounter (Signed)
PC placed to pt today to go over lab results. ?I left message on VM informing of who I am and reason for calling.  I will call again tomorrow to try to reach her. ?

## 2021-04-01 NOTE — Telephone Encounter (Signed)
PC placed to pt today to go over lab results for work up of hypercalcemia. ?Went over workup results.  So far, Vit D level was not elev, PTH and TSH normal.  Pt had elevation in FLC and mild M protein spike.  I told her that I touched base with her oncologist Dr. Lindi Adie via messaging on 03/30/21 about this.  He said he will have his office call her to arrange an appt.  Pt states she has already being contacted by them and has appt. ?She has appt for her MMG later this mth.  Pt with hx of breast CA. ?Results for orders placed or performed in visit on 03/28/21  ?PE and FLC, Serum  ?Result Value Ref Range  ? Albumin ELP 4.1 2.9 - 4.4 g/dL  ? Alpha 1 0.2 0.0 - 0.4 g/dL  ? Alpha 2 0.9 0.4 - 1.0 g/dL  ? Beta 1.2 0.7 - 1.3 g/dL  ? Gamma Globulin 1.3 0.4 - 1.8 g/dL  ? M-Spike, % 0.3 (H) Not Observed g/dL  ? Globulin, Total 3.7 2.2 - 3.9 g/dL  ? A/G Ratio 1.1 0.7 - 1.7  ?PTH, intact and calcium  ?Result Value Ref Range  ? PTH 26 15 - 65 pg/mL  ?PTH-related peptide  ?Result Value Ref Range  ? PTH-related peptide WILL FOLLOW   ?TSH  ?Result Value Ref Range  ? TSH 0.585 0.450 - 4.500 uIU/mL  ?VITAMIN D 25 Hydroxy (Vit-D Deficiency, Fractures)  ?Result Value Ref Range  ? Vit D, 25-Hydroxy 30.1 30.0 - 100.0 ng/mL  ? ? ?

## 2021-04-05 ENCOUNTER — Other Ambulatory Visit: Payer: Self-pay

## 2021-04-05 ENCOUNTER — Ambulatory Visit: Payer: Medicare Other

## 2021-04-05 ENCOUNTER — Ambulatory Visit
Admission: RE | Admit: 2021-04-05 | Discharge: 2021-04-05 | Disposition: A | Discharge status: Home / Self Care | Payer: Medicare Other | Source: Ambulatory Visit | Attending: Internal Medicine | Admitting: Internal Medicine

## 2021-04-05 DIAGNOSIS — Z1231 Encounter for screening mammogram for malignant neoplasm of breast: Secondary | ICD-10-CM

## 2021-04-06 LAB — PE AND FLC, SERUM
A/G Ratio: 1.1 (ref 0.7–1.7)
Albumin ELP: 4.1 g/dL (ref 2.9–4.4)
Alpha 1: 0.2 g/dL (ref 0.0–0.4)
Alpha 2: 0.9 g/dL (ref 0.4–1.0)
Beta: 1.2 g/dL (ref 0.7–1.3)
Gamma Globulin: 1.3 g/dL (ref 0.4–1.8)
Globulin, Total: 3.7 g/dL (ref 2.2–3.9)
Ig Kappa Free Light Chain: 25.3 mg/L — ABNORMAL HIGH (ref 3.3–19.4)
Ig Lambda Free Light Chain: 8.6 mg/L (ref 5.7–26.3)
KAPPA/LAMBDA RATIO: 2.94 — ABNORMAL HIGH (ref 0.26–1.65)
M-Spike, %: 0.3 g/dL — ABNORMAL HIGH
Total Protein: 7.8 g/dL (ref 6.0–8.5)

## 2021-04-06 LAB — PTH-RELATED PEPTIDE: PTH-related peptide: <2 pmol/L

## 2021-04-06 LAB — VITAMIN D 25 HYDROXY (VIT D DEFICIENCY, FRACTURES): Vit D, 25-Hydroxy: 30.1 ng/mL (ref 30.0–100.0)

## 2021-04-06 LAB — PTH, INTACT AND CALCIUM
Calcium: 10.7 mg/dL — ABNORMAL HIGH (ref 8.7–10.3)
PTH: 26 pg/mL (ref 15–65)

## 2021-04-06 LAB — TSH: TSH: 0.585 u[IU]/mL (ref 0.450–4.500)

## 2021-04-07 ENCOUNTER — Telehealth: Payer: Self-pay

## 2021-04-07 NOTE — Telephone Encounter (Signed)
Contacted pt to go over mm results pt is aware and doesn't have any questions or concerns  

## 2021-04-14 NOTE — Progress Notes (Signed)
? ?Patient Care Team: ?[name], MD as PCP - General (Internal Medicine) ?Fanny Skates, MD as Consulting Physician (General Surgery) ?Nicholas Lose, MD as Consulting Physician (Hematology and Oncology) ?Gery Pray, MD as Consulting Physician (Radiation Oncology) ?Rockwell Germany, RN as Oncology Nurse Navigator ?Mauro Kaufmann, RN as Oncology Nurse Navigator ? ?DIAGNOSIS:  ?Encounter Diagnoses  ?Name Primary?  ? Malignant neoplasm of upper-outer quadrant of right breast in female, estrogen receptor positive ([place])   ? Post-menopausal Yes  ? MGUS (monoclonal gammopathy of unknown significance)   ? ? ?SUMMARY OF ONCOLOGIC HISTORY: ?Oncology History  ?Malignant neoplasm of upper-outer quadrant of right breast in female, estrogen receptor positive ([place])  ?01/27/2018 Mammogram  ? Screening Mammogram  ?Possible right breast mass; asymmetry in left breast. ?  ?02/10/2018 Initial Diagnosis  ? Screening mammogram detected right breast mass and asymmetry in the left breast.  The left breast asymmetry resolved.  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%, HER-2 +3+ by IHC, T1BN0 stage Ia clinical stage ?  ?03/24/2018 Surgery  ? Lumpectomy and SLNB: IDC, 1.2cm, grade 2, margins negative, 4 SLN negative, T1cN0, previously ER/PR/HER-2 positive with Ki-67 of 20% ?  ?03/24/2018 Cancer Staging  ? Staging form: Breast, AJCC 8th Edition ?- Pathologic stage from 03/24/2018: Stage IA (pT1c, pN0, cM0, G2, ER+, PR+, HER2+) - Signed by Gardenia Phlegm, NP on 03/26/2018 ? ?  ?04/22/2018 - 06/24/2018 Chemotherapy  ? The patient had trastuzumab (HERCEPTIN) 336 mg in sodium chloride 0.9 % 250 mL chemo infusion, 4 mg/kg = 336 mg, Intravenous,  Once, 3 of 16 cycles ?Administration: 336 mg (04/22/2018), 168 mg (04/29/2018), 168 mg (05/21/2018), 168 mg (05/06/2018), 168 mg (05/13/2018), 168 mg (05/27/2018), 168 mg (06/03/2018),  168 mg (06/10/2018), 168 mg (06/17/2018), 168 mg (06/24/2018) ?PACLitaxel (TAXOL) 156 mg in sodium chloride 0.9 % 250 mL chemo infusion (</= 59m/m2), 80 mg/m2 = 156 mg, Intravenous,  Once, 3 of 3 cycles ?Administration: 156 mg (04/22/2018), 156 mg (04/29/2018), 156 mg (05/21/2018), 156 mg (05/06/2018), 156 mg (05/13/2018), 156 mg (05/27/2018), 156 mg (06/03/2018), 156 mg (06/10/2018), 156 mg (06/17/2018), 156 mg (06/24/2018) ? ? for chemotherapy treatment.  ? ?  ?07/01/2018 - 03/31/2019 Chemotherapy  ?  ? ?  ? ?  ?07/29/2018 - 08/22/2018 Radiation Therapy  ? Adjuvant XRT ?  ?09/05/2018 -  Anti-estrogen oral therapy  ? Anastrozole 11mdaily, plan for 5-7 years  ?  ? ? ?CHIEF COMPLIANT: right breast cancer on anastrozole, hypercalcemia evaluation ? ?INTERVAL HISTORY: MaAnaisabel Pedersons a  [age].o. with above-mentioned history of right breast cancer who underwent a lumpectomy, adjuvant chemotherapy, radiation, [name], and is currently on anti-estrogen therapy with anastrozole. She presents to the clinic today for a follow-up. She states the pain remain the same. She is tolerating the anastrozole, just moderate hot flashes. ?She was noted to have hypercalcemia on blood work and additional blood work was performed which included PTH and PTH RP along with serum protein electrophoresis.  All of the test came back normal with exception of 0.3 g of monoclonal protein for which she was asked [place]? ? ?ALLERGIES:  has No Known Allergies. ? ?MEDICATIONS:  ?Current Outpatient Medications  ?Medication Sig Dispense Refill  ? magnesium 30 MG tablet Take 1 tablet (30 mg total) by mouth daily.    ? amLODipine (NORVASC) 10 MG tablet  TAKE 1 TABLET (10 MG TOTAL) BY MOUTH DAILY. 90 tablet 0  ? anastrozole (ARIMIDEX) 1 MG tablet TAKE 1 TABLET BY MOUTH ONCE DAILY 90 tablet 3  ? Artificial Tear Ointment (DRY EYES OP) Place 1 drop into both eyes daily as needed (Dry eyes).    ? aspirin 81 MG tablet Take 1 tablet (81 mg total) by mouth  daily. 100 tablet 2  ? blood glucose meter kit and supplies KIT Dispense based on patient and insurance preference. Use up to four times daily as directed. (FOR ICD-9 250.00, 250.01). 1 each 0  ? CALCIUM PO Take 15 mLs by mouth daily.    ? clopidogrel (PLAVIX) 75 MG tablet TAKE 1 TABLET (75 MG TOTAL) BY MOUTH DAILY. 30 tablet 6  ? Dulaglutide (TRULICITY) 1.5 HQ/7.5FF SOPN Inject 1.5 mg into the skin once a week. 2 mL 5  ? Elderberry 575 MG/5ML SYRP Take 15 mLs by mouth daily as needed.     ? Insulin Pen Needle (TRUEPLUS PEN NEEDLES) 32G X 4 MM MISC Use as directed to inject insulin 100 each 3  ? LANTUS SOLOSTAR 100 UNIT/ML Solostar Pen INJECT 10 UNITS INTO THE SKIN 2 (TWO) TIMES DAILY. 15 mL 6  ? lisinopril (ZESTRIL) 10 MG tablet Take 1 tablet (10 mg total) by mouth daily. 90 tablet 1  ? metFORMIN (GLUCOPHAGE) 1000 MG tablet TAKE 1 TABLET (1,000 MG TOTAL) BY MOUTH 2 (TWO) TIMES DAILY WITH A MEAL. 180 tablet 0  ? rosuvastatin (CRESTOR) 40 MG tablet TAKE 1 TABLET (40 MG TOTAL) BY MOUTH DAILY. 90 tablet 0  ? ?No current facility-administered medications for this visit.  ? ? ?PHYSICAL EXAMINATION: ?ECOG PERFORMANCE STATUS: 1 - Symptomatic but completely ambulatory ? ?Vitals:  ? 04/17/21 1044  ?BP: (!) 162/67  ?Pulse: 91  ?Resp: 18  ?Temp: 97.6 ?F (36.4 ?C)  ?SpO2: 100%  ? ?Filed Weights  ? 04/17/21 1044  ?Weight: 189 lb 12.8 oz (86.1 kg)  ? ? ?BREAST: No palpable masses or nodules in either right or left breasts. No palpable axillary supraclavicular or infraclavicular adenopathy no breast tenderness or nipple discharge. (exam performed in the presence of a chaperone) ? ?LABORATORY DATA:  ?I have reviewed the data as listed ? ?  Latest Ref Rng & Units 03/28/2021  ? 10:31 AM 03/23/2021  ? 10:16 AM 04/01/2020  ?  8:36 AM  ?CMP  ?Glucose 70 - 99 mg/dL  93   142    ?BUN 8 - 27 mg/dL  13   13    ?Creatinine 0.57 - 1.00 mg/dL  0.72   0.75    ?Sodium 134 - 144 mmol/L  141   140    ?Potassium 3.5 - 5.2 mmol/L  4.2   4.2    ?Chloride  96 - 106 mmol/L  103   101    ?CO2 20 - 29 mmol/L  22   19    ?Calcium 8.7 - 10.3 mg/dL 10.7   11.1   10.4    ?Total Protein 6.0 - 8.5 g/dL 7.8   8.2   7.8    ?Total Bilirubin 0.0 - 1.2 mg/dL  <0.2   <0.2    ?Alkaline Phos 44 - 121 IU/L  66   66    ?AST 0 - 40 IU/L  19   15    ?ALT 0 - 32 IU/L  23   16    ? ? ?Lab Results  ?Component Value Date  ? WBC 7.5  03/23/2021  ? HGB 13.4 03/23/2021  ? HCT 41.5 03/23/2021  ? MCV 83 03/23/2021  ? PLT 324 03/23/2021  ? NEUTROABS 4.3 03/10/2019  ? ? ?ASSESSMENT & PLAN:  ?Malignant neoplasm of upper-outer quadrant of right breast in female, estrogen receptor positive ([place]) ?02/04/2018:Screening mammogram detected right breast mass and asymmetry in the left breast.  The left breast asymmetry resolved.  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%, HER-2 +3+ by IHC, T1BN0 stage Ia clinical stage ?  ?03/24/2018: Rt BCS: Grade 2 IDC 1.2 cm  ER 100%, PR 70%, Ki-67 20%, HER-2 +3+ by IHC, T1BN0 stage Ia ?Left BCS: DCIS ?  ?Treatment plan: ?1. Adj chemo with Taxol Herceptin weekly X 11 (stopped for peripheral neuropathy) started 04/22/2018-07/01/2018, now on [name] to complete 1 full year of therapy completed March 2021 ?2. Adjuvant radiation therapy completed 08/22/2018 ?3. Adjuvant antiestrogen therapy started 09/05/2018 ?------------------------------------------------------------------------------------------------------------------------ ?Current treatment: Anastrozole 1 mg daily started 09/05/2018 ?Anastrozole toxicities: Mild hot flashes and muscle stiffness but otherwise doing quite well ?She is due for a bone density test next month. ?Hypertension: Being managed by her PCP.  She is worried about her blood pressure readings. ? ?Breast cancer surveillance: ?1.  Breast exam 04/17/2021: Benign ?2. Mammograms 04/06/2021: Benign breast density category B ?3.  Bone density  05/12/2019: T score -1: Normal ?  ?Return to clinic in 1 year for follow-up ? ?MGUS (monoclonal gammopathy of unknown significance) ?Hypercalcemia work-up revealed serum protein electrophoresis with 0.3 g

## 2021-04-17 ENCOUNTER — Other Ambulatory Visit: Payer: Self-pay

## 2021-04-17 ENCOUNTER — Other Ambulatory Visit (HOSPITAL_COMMUNITY): Payer: Self-pay

## 2021-04-17 ENCOUNTER — Inpatient Hospital Stay: Payer: Medicare Other | Attending: Hematology and Oncology | Admitting: Hematology and Oncology

## 2021-04-17 ENCOUNTER — Other Ambulatory Visit: Payer: Self-pay | Admitting: Internal Medicine

## 2021-04-17 VITALS — BP 162/67 | HR 91 | Temp 97.6°F | Resp 18 | Ht 65.0 in | Wt 189.8 lb

## 2021-04-17 DIAGNOSIS — C50411 Malignant neoplasm of upper-outer quadrant of right female breast: Secondary | ICD-10-CM | POA: Diagnosis not present

## 2021-04-17 DIAGNOSIS — D472 Monoclonal gammopathy: Secondary | ICD-10-CM | POA: Insufficient documentation

## 2021-04-17 DIAGNOSIS — Z170 Estrogen receptor positive status [ER+]: Secondary | ICD-10-CM | POA: Insufficient documentation

## 2021-04-17 DIAGNOSIS — Z780 Asymptomatic menopausal state: Secondary | ICD-10-CM | POA: Diagnosis not present

## 2021-04-17 DIAGNOSIS — Z794 Long term (current) use of insulin: Secondary | ICD-10-CM

## 2021-04-17 MED ORDER — ANASTROZOLE 1 MG PO TABS
ORAL_TABLET | Freq: Every day | ORAL | 3 refills | Status: DC
  Filled 2021-04-17: qty 90, fill #0
  Filled 2021-06-19: qty 90, 90d supply, fill #0
  Filled 2021-12-14: qty 30, 30d supply, fill #0
  Filled 2022-01-11: qty 30, 30d supply, fill #1
  Filled 2022-02-12: qty 30, 30d supply, fill #2
  Filled 2022-03-23: qty 30, 30d supply, fill #3

## 2021-04-17 MED ORDER — MAGNESIUM 30 MG PO TABS: 30.0000 mg | ORAL_TABLET | Freq: Every day | ORAL | Status: AC

## 2021-04-17 NOTE — Assessment & Plan Note (Addendum)
02/04/2018:Screening mammogram detected right breast mass and asymmetry in the left breast. ?The left breast asymmetry resolved. ?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%, HER-2 +3+ by IHC, T1BN0 stage Ia clinical stage ?? ?03/24/2018: Rt BCS: Grade 2 IDC 1.2 cm ?ER 100%, PR 70%, Ki-67 20%, HER-2 +3+ by IHC, T1BN0 stage Ia ?Left BCS: DCIS ?? ?Treatment plan: ?1. Adj chemo with Taxol Herceptin weekly X 11 (stopped for peripheral neuropathy)?started 04/22/2018-07/01/2018,?now on [name] to complete 1 full year of therapy?completed March 2021 ?2. Adjuvant radiation therapy completed 08/22/2018 ?3. Adjuvant antiestrogen therapy started 09/05/2018 ?------------------------------------------------------------------------------------------------------------------------ ?Current treatment:?Anastrozole 1 mg daily started 09/05/2018 ?Anastrozole toxicities:?Mild hot flashes and muscle stiffness but otherwise doing quite well ?? ?Hypertension: Being managed by her PCP.  She is worried about her blood pressure readings. ? ?Breast cancer surveillance: ?1.??Breast exam 04/17/2021: Benign ?2.?Mammograms 04/06/2021: Benign breast density category B ?3.??Bone density 05/12/2019: T score -1: Normal ?? ?Return to clinic in 1 year for follow-up ?

## 2021-04-17 NOTE — Assessment & Plan Note (Signed)
Hypercalcemia work-up revealed serum protein electrophoresis with 0.3 g of M protein. ?Kappa: 25.3, kappa lambda ratio 2.94 ?Calcium 10.7, PTH 26: Normal, PTH RP less than 2, TSH 0.585, vitamin D30.1 ? ?Counseling: I discussed with the patient the spectrum of disorders from MGUS to multiple myeloma. We discussed the role of plasma cells in producing immunoglobulins. We discussed structure of immunoglobulins on how they make up the heavy chains and the light chains. The light chains are Kappa and lambda. I discussed the difference between MGUS and multiple myeloma. MGUS is characterized by elevation monoclonal protein without any end organ damage. Multiple myeloma is associated with elevation monoclonal protein and end organ damage (hypercalcemia, renal dysfunction, anemia, bone lytic lesions ) along with a bone marrow showing greater than 10% plasma cells. ? ?Workup recommended: ? Bone survey ? ?I do not believe the hypercalcemia is related to multiple myeloma.  She will stop taking oral calcium supplementation. ? ?If the bone survey is negative then we will see her once a year with labs done ahead of time. ? ?

## 2021-04-18 ENCOUNTER — Other Ambulatory Visit: Payer: Self-pay

## 2021-04-18 ENCOUNTER — Ambulatory Visit: Payer: Medicare Other | Attending: Internal Medicine | Admitting: Pharmacist

## 2021-04-18 VITALS — BP 123/70 | HR 86

## 2021-04-18 DIAGNOSIS — I10 Essential (primary) hypertension: Secondary | ICD-10-CM | POA: Diagnosis not present

## 2021-04-18 MED ORDER — METFORMIN HCL 1000 MG PO TABS
ORAL_TABLET | Freq: Two times a day (BID) | ORAL | 1 refills | Status: DC
  Filled 2021-04-18: qty 180, 90d supply, fill #0
  Filled 2021-08-07: qty 180, 90d supply, fill #1

## 2021-04-18 NOTE — Telephone Encounter (Signed)
Requested Prescriptions  ?Pending Prescriptions Disp Refills  ?? metFORMIN (GLUCOPHAGE) 1000 MG tablet 180 tablet 1  ?  Sig: TAKE 1 TABLET (1,000 MG TOTAL) BY MOUTH 2 (TWO) TIMES DAILY WITH A MEAL.  ?  ? Endocrinology:  Diabetes - Biguanides Failed - 04/17/2021  9:28 AM  ?  ?  Failed - B12 Level in normal range and within 720 days  ?  No results found for: VITAMINB12   ?  ?  Failed - CBC within normal limits and completed in the last 12 months  ?  WBC  ?Date Value Ref Range Status  ?03/23/2021 7.5 3.4 - 10.8 x10E3/uL Final  ?05/19/2019 6.1 4.0 - 10.5 K/uL Final  ? ?RBC  ?Date Value Ref Range Status  ?03/23/2021 4.98 3.77 - 5.28 x10E6/uL Final  ?05/19/2019 4.72 3.87 - 5.11 MIL/uL Final  ? ?Hemoglobin  ?Date Value Ref Range Status  ?03/23/2021 13.4 11.1 - 15.9 g/dL Final  ? ?Hematocrit  ?Date Value Ref Range Status  ?03/23/2021 41.5 34.0 - 46.6 % Final  ? ?MCHC  ?Date Value Ref Range Status  ?03/23/2021 32.3 31.5 - 35.7 g/dL Final  ?05/19/2019 31.9 30.0 - 36.0 g/dL Final  ? ?MCH  ?Date Value Ref Range Status  ?03/23/2021 26.9 26.6 - 33.0 pg Final  ?05/19/2019 27.5 26.0 - 34.0 pg Final  ? ?MCV  ?Date Value Ref Range Status  ?03/23/2021 83 79 - 97 fL Final  ? ?No results found for: PLTCOUNTKUC, LABPLAT, [place] ?RDW  ?Date Value Ref Range Status  ?03/23/2021 13.6 11.7 - 15.4 % Final  ? ?  ?  ?  Passed - Cr in normal range and within 360 days  ?  Creatinine  ?Date Value Ref Range Status  ?03/10/2019 1.04 (H) 0.44 - 1.00 mg/dL Final  ? ?Creatinine, Ser  ?Date Value Ref Range Status  ?03/23/2021 0.72 0.57 - 1.00 mg/dL Final  ?   ?  ?  Passed - HBA1C is between 0 and 7.9 and within 180 days  ?  Hemoglobin A1C  ?Date Value Ref Range Status  ?03/23/2021 7.0 (A) 4.0 - 5.6 % Final  ? ?HbA1c, POC (controlled diabetic range)  ?Date Value Ref Range Status  ?07/22/2020 8.0 (A) 0.0 - 7.0 % Final  ? ?Hgb A1c MFr Bld  ?Date Value Ref Range Status  ?10/17/2020 7.1 (H) 4.8 - 5.6 % Final  ?  Comment:  ?           Prediabetes: 5.7 - 6.4 ?          Diabetes: >6.4 ?         Glycemic control for adults with diabetes: <7.0 ?  ?   ?  ?  Passed - eGFR in normal range and within 360 days  ?  GFR, Est AFR Am  ?Date Value Ref Range Status  ?03/10/2019 >60 >60 mL/min Final  ? ?GFR calc Af Amer  ?Date Value Ref Range Status  ?05/19/2019 >60 >60 mL/min Final  ? ?GFR, Estimated  ?Date Value Ref Range Status  ?03/10/2019 56 (L) >60 mL/min Final  ? ?GFR calc non Af Amer  ?Date Value Ref Range Status  ?05/19/2019 >60 >60 mL/min Final  ? ?eGFR  ?Date Value Ref Range Status  ?03/23/2021 91 >59 mL/min/1.73 Final  ?   ?  ?  Passed - Valid encounter within last 6 months  ?  Recent Outpatient Visits   ?      ? Today   ? [place], RPH-CPP  ? 3 weeks ago Type 2 diabetes mellitus with obesity ([place])  ? [place] [name] B, MD  ? 2 months ago Need for vaccination for zoster  ? [place], RPH-CPP  ? 4 months ago Need for vaccination for zoster  ? [place], RPH-CPP  ? 4 months ago Type 2 diabetes mellitus with obesity ([place])  ? [place] [name], MD  ?  ?  ?Future Appointments   ?        ? In 1 month Daisy Blossom, Jarome Matin, [place]  ? In 2 months [name], MD [place]  ?  ? ?  ?  ?  ? ?

## 2021-04-18 NOTE — Progress Notes (Signed)
? ?  S:    ? ?No chief complaint on file. ? ? ?Sandra Shelton is a 66 y.o. female who presents for hypertension evaluation, education, and management. 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 Patient was referred and last seen by Primary Care Provider, Dr. Wynetta Emery, on 03/23/2021.  ? ?Today, patient arrives in good spirits and presents without assistance. Denies dizziness, headache, blurred vision, swelling.  ? ?Patient reports hypertension is longstanding.  ? ?Family/Social history:  ?-Fhx: DM, stroke, ?-Tobacco: never smoker  ?-Alcohol: none reported ? ?Medication adherence reported. Patient has taken BP medications today.  ? ?Current antihypertensives include: amlodipine 10 mg daily, lisinopril 10 mg daily ? ?Reported home BP readings:  ?SBPs: 136 - 153 ?DBPs: 70s-80s ? ?Patient reported dietary habits:  ?- Does not add salt when cooking or adding salt  ?- Denies drinking excessive caffeine  ? ?Patient-reported exercise habits:  ?-None recently  ? ?O:  ?Vitals:  ? 04/18/21 1011 04/18/21 1012  ?BP: 115/71 123/70  ?Pulse: 87 86  ? ? ? ?Last 3 Office BP readings: ?BP Readings from Last 3 Encounters:  ?04/17/21 (!) 162/67  ?03/23/21 (!) 151/77  ?11/22/20 137/86  ? ? ?BMET ?   ?Component Value Date/Time  ? NA 141 03/23/2021 1016  ? K 4.2 03/23/2021 1016  ? CL 103 03/23/2021 1016  ? CO2 22 03/23/2021 1016  ? GLUCOSE 93 03/23/2021 1016  ? GLUCOSE 166 (H) 05/19/2019 0730  ? BUN 13 03/23/2021 1016  ? CREATININE 0.72 03/23/2021 1016  ? CREATININE 1.04 (H) 03/10/2019 0845  ? CALCIUM 10.7 (H) 03/28/2021 1031  ? GFRNONAA >60 05/19/2019 0730  ? GFRNONAA 56 (L) 03/10/2019 0845  ? GFRAA >60 05/19/2019 0730  ? GFRAA >60 03/10/2019 0845  ? ? ?Renal function: ?CrCl cannot be calculated (Patient's most recent lab result is older than the maximum 21 days allowed.). ? ?Clinical ASCVD: Yes  ?The ASCVD  Risk score (Arnett DK, et al., 2019) failed to calculate for the following reasons: ?  The patient has a prior MI or stroke diagnosis ? ?A/P: ?Hypertension longstanding currently at goal on current medications. BP goal < 130/80 mmHg. Medication adherence appears appropriate. Took BP twice - once in each arm. Both Bps were at goal.   ?-Continued current regimen.  ?-F/u labs ordered - none ?-Counseled on lifestyle modifications for blood pressure control including reduced dietary sodium, increased exercise, adequate sleep. ?-Encouraged patient to check BP at home and bring log of readings to next visit. Counseled on proper use of home BP cuff.  ? ?Results reviewed and written information provided. Patient verbalized understanding of treatment plan. Total time in face-to-face counseling 20 minutes.  ? ?F/u clinic visit in 1 month. ? ?Benard Halsted, PharmD, BCACP, CPP ?Clinical Pharmacist ?[facility] ?[phone] ? ? ?

## 2021-04-19 ENCOUNTER — Encounter: Payer: Self-pay | Admitting: Pharmacist

## 2021-04-19 ENCOUNTER — Other Ambulatory Visit: Payer: Self-pay

## 2021-04-24 ENCOUNTER — Ambulatory Visit (HOSPITAL_COMMUNITY)
Admission: RE | Admit: 2021-04-24 | Discharge: 2021-04-24 | Disposition: A | Discharge status: Home / Self Care | Payer: Medicare Other | Source: Ambulatory Visit | Attending: Hematology and Oncology | Admitting: Hematology and Oncology

## 2021-04-24 DIAGNOSIS — C50411 Malignant neoplasm of upper-outer quadrant of right female breast: Secondary | ICD-10-CM | POA: Diagnosis not present

## 2021-04-24 DIAGNOSIS — D472 Monoclonal gammopathy: Secondary | ICD-10-CM | POA: Insufficient documentation

## 2021-04-24 DIAGNOSIS — Z780 Asymptomatic menopausal state: Secondary | ICD-10-CM | POA: Insufficient documentation

## 2021-04-24 DIAGNOSIS — Z170 Estrogen receptor positive status [ER+]: Secondary | ICD-10-CM | POA: Diagnosis not present

## 2021-04-24 DIAGNOSIS — M170 Bilateral primary osteoarthritis of knee: Secondary | ICD-10-CM | POA: Diagnosis not present

## 2021-04-24 DIAGNOSIS — C50919 Malignant neoplasm of unspecified site of unspecified female breast: Secondary | ICD-10-CM | POA: Diagnosis not present

## 2021-05-02 ENCOUNTER — Telehealth: Payer: Self-pay

## 2021-05-02 NOTE — Telephone Encounter (Signed)
-----   Message from Gardenia Phlegm, NP sent at 04/30/2021 10:31 PM EDT ----- ?Bone survey negative, please let patient know ?----- Message ----- ?From: Interface, Rad Results In ?Sent: 04/24/2021   1:34 PM EDT ?To: Nicholas Lose, MD ? ? ?

## 2021-05-02 NOTE — Telephone Encounter (Signed)
Called and spoke with pt, bone survey negative, pt verbalized understanding and thanks  ?

## 2021-05-15 ENCOUNTER — Other Ambulatory Visit: Payer: Self-pay

## 2021-05-15 ENCOUNTER — Other Ambulatory Visit: Payer: Self-pay | Admitting: Internal Medicine

## 2021-05-15 MED ORDER — ROSUVASTATIN CALCIUM 40 MG PO TABS
ORAL_TABLET | Freq: Every day | ORAL | 1 refills | Status: DC
  Filled 2021-05-15: qty 90, 90d supply, fill #0
  Filled 2021-08-22: qty 90, 90d supply, fill #1

## 2021-05-18 ENCOUNTER — Encounter: Payer: Self-pay | Admitting: Pharmacist

## 2021-05-18 ENCOUNTER — Other Ambulatory Visit: Payer: Self-pay

## 2021-05-18 ENCOUNTER — Ambulatory Visit: Payer: Medicare Other | Attending: Internal Medicine | Admitting: Pharmacist

## 2021-05-18 VITALS — BP 121/68

## 2021-05-18 DIAGNOSIS — I10 Essential (primary) hypertension: Secondary | ICD-10-CM | POA: Diagnosis not present

## 2021-05-18 NOTE — Progress Notes (Signed)
? ?  S:    ? ?No chief complaint on file. ? ?Sandra Shelton is a 66 y.o. female who presents for hypertension evaluation, education, and management. 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 Patient was referred and last seen by Primary Care Provider, Dr. Wynetta Emery, on 03/23/2021. I saw her on 04/18/2021 and her BP was good. ? ?Today, patient arrives in good spirits and presents without assistance. Denies dizziness, headache, blurred vision, swelling.  ? ?Patient reports hypertension is longstanding.  ? ?Family/Social history:  ?-Fhx: DM, stroke, ?-Tobacco: never smoker  ?-Alcohol: none reported ? ?Medication adherence reported. Patient has taken BP medications today.  ? ?Current antihypertensives include: amlodipine 10 mg daily, lisinopril 10 mg daily ? ?Reported home BP readings:  ?SBPs: 109 - 169 ?DBPs: 68 - 87 ?Of note most SBP readings are in the 120s-140s since our last visit. Most diastolic pressures are <50.  ? ?Patient reported dietary habits:  ?- Does not add salt when cooking or adding salt  ?- Denies drinking excessive caffeine  ? ?Patient-reported exercise habits:  ?-None recently  ? ?O:  ?Vitals:  ? 05/18/21 1032  ?BP: 121/68  ? ? ?Last 3 Office BP readings: ?BP Readings from Last 3 Encounters:  ?05/18/21 121/68  ?04/18/21 123/70  ?04/17/21 (!) 162/67  ? ? ?BMET ?   ?Component Value Date/Time  ? NA 141 03/23/2021 1016  ? K 4.2 03/23/2021 1016  ? CL 103 03/23/2021 1016  ? CO2 22 03/23/2021 1016  ? GLUCOSE 93 03/23/2021 1016  ? GLUCOSE 166 (H) 05/19/2019 0730  ? BUN 13 03/23/2021 1016  ? CREATININE 0.72 03/23/2021 1016  ? CREATININE 1.04 (H) 03/10/2019 0845  ? CALCIUM 10.7 (H) 03/28/2021 1031  ? GFRNONAA >60 05/19/2019 0730  ? GFRNONAA 56 (L) 03/10/2019 0845  ? GFRAA >60 05/19/2019 0730  ? GFRAA >60 03/10/2019 0845  ? ? ?Renal function: ?CrCl cannot be calculated (Patient's  most recent lab result is older than the maximum 21 days allowed.). ? ?Clinical ASCVD: Yes  ?The ASCVD Risk score (Arnett DK, et al., 2019) failed to calculate for the following reasons: ?  The patient has a prior MI or stroke diagnosis ? ?A/P: ?Hypertension longstanding currently at goal on current medications. BP goal < 130/80 mmHg. Medication adherence appears appropriate. Her BP at home tends to vary but most readings are closer to goal. Will have her bring in her BP cuff for validation. I have time to let her rest ~5 minutes before readings and her BP has been good with me x2 visits in a row. ?-Continued current regimen.  ?-F/u labs ordered - none ?-Counseled on lifestyle modifications for blood pressure control including reduced dietary sodium, increased exercise, adequate sleep. ?-Encouraged patient to check BP at home and bring log of readings to next visit. Counseled on proper use of home BP cuff.  ? ?Results reviewed and written information provided. Patient verbalized understanding of treatment plan. Total time in face-to-face counseling 20 minutes.  ? ?F/u clinic visit in 1 month. ? ?Benard Halsted, PharmD, BCACP, CPP ?Clinical Pharmacist ?[facility] ?[phone] ? ? ?

## 2021-05-22 ENCOUNTER — Ambulatory Visit
Admission: RE | Admit: 2021-05-22 | Discharge: 2021-05-22 | Disposition: A | Discharge status: Home / Self Care | Payer: Medicare Other | Source: Ambulatory Visit | Attending: Hematology and Oncology | Admitting: Hematology and Oncology

## 2021-05-22 ENCOUNTER — Other Ambulatory Visit: Payer: Self-pay

## 2021-05-22 DIAGNOSIS — Z780 Asymptomatic menopausal state: Secondary | ICD-10-CM

## 2021-05-22 DIAGNOSIS — M85852 Other specified disorders of bone density and structure, left thigh: Secondary | ICD-10-CM | POA: Diagnosis not present

## 2021-06-19 ENCOUNTER — Other Ambulatory Visit: Payer: Self-pay | Admitting: Internal Medicine

## 2021-06-19 ENCOUNTER — Other Ambulatory Visit: Payer: Self-pay

## 2021-06-19 DIAGNOSIS — I779 Disorder of arteries and arterioles, unspecified: Secondary | ICD-10-CM

## 2021-06-19 DIAGNOSIS — Z8673 Personal history of transient ischemic attack (TIA), and cerebral infarction without residual deficits: Secondary | ICD-10-CM

## 2021-06-19 MED ORDER — CLOPIDOGREL BISULFATE 75 MG PO TABS
ORAL_TABLET | Freq: Every day | ORAL | 6 refills | Status: DC
  Filled 2021-06-19: qty 30, 30d supply, fill #0
  Filled 2021-07-11 – 2021-07-12 (×3): qty 30, 30d supply, fill #1
  Filled 2021-08-07: qty 30, 30d supply, fill #2

## 2021-06-20 ENCOUNTER — Ambulatory Visit: Payer: Medicare Other | Attending: Internal Medicine | Admitting: Pharmacist

## 2021-06-20 VITALS — BP 120/71 | HR 81

## 2021-06-20 DIAGNOSIS — I10 Essential (primary) hypertension: Secondary | ICD-10-CM | POA: Diagnosis not present

## 2021-06-20 NOTE — Progress Notes (Signed)
   S:     No chief complaint on file.  Sandra Shelton is a 66 y.o. female who presents for hypertension evaluation, education, and management. 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 Patient was referred by Primary Care Provider, Dr. Wynetta Emery, on 03/23/2021. Last seen by pharmacist on 05/18/2021 and her BP was at goal x 2 visits in a row.  Today, patient arrives in good spirits and presents without assistance. Denies dizziness, headache, blurred vision, swelling.   Family/Social history:  -Fhx: DM, stroke, -Tobacco: never smoker  -Alcohol: none reported  Medication adherence reported. Patient has taken medications today and remembers to take each morning after waking up.  Current antihypertensives include: amlodipine 10 mg daily, lisinopril 10 mg daily  Reported home BP readings:  SBP 120-150s  Patient reported dietary habits: avoids adding salt when cooking. Denies drinking excessive caffeine. Patient-reported exercise habits: some walking  O:  Vitals:   06/20/21 1035  BP: 120/71  Pulse: 81     Last 3 Office BP readings: BP Readings from Last 3 Encounters:  06/20/21 120/71  05/18/21 121/68  04/18/21 123/70    BMET    Component Value Date/Time   NA 141 03/23/2021 1016   K 4.2 03/23/2021 1016   CL 103 03/23/2021 1016   CO2 22 03/23/2021 1016   GLUCOSE 93 03/23/2021 1016   GLUCOSE 166 (H) 05/19/2019 0730   BUN 13 03/23/2021 1016   CREATININE 0.72 03/23/2021 1016   CREATININE 1.04 (H) 03/10/2019 0845   CALCIUM 10.7 (H) 03/28/2021 1031   GFRNONAA >60 05/19/2019 0730   GFRNONAA 56 (L) 03/10/2019 0845   GFRAA >60 05/19/2019 0730   GFRAA >60 03/10/2019 0845    Renal function: CrCl cannot be calculated (Patient's most recent lab result is older than the maximum 21 days allowed.).  Clinical ASCVD: Yes  The ASCVD Risk score (Arnett  DK, et al., 2019) failed to calculate for the following reasons:   The patient has a prior MI or stroke diagnosis  A/P: Hypertension longstanding currently controlled on current medications. BP goal < 130/80 mmHg. Medication adherence is appropriate.  -Continued current regimen.  -F/u labs ordered - none -Reviewed appropriate technique to check blood pressure at home and encouraged continuing to bring log of home blood pressure readings to appointments.  -Counseled on lifestyle modifications for blood pressure control including reduced dietary sodium, increased exercise, adequate sleep.  Results reviewed and written information provided. Patient verbalized understanding of treatment plan. Total time in face-to-face counseling 20 minutes.   F/u clinic visit with PCP later this week.  Thank you for allowing pharmacy to participate in this patient's care.  Levonne Spiller, PharmD PGY1 Acute Care Resident  06/20/2021,2:12 PM

## 2021-06-23 ENCOUNTER — Other Ambulatory Visit: Payer: Self-pay

## 2021-06-23 ENCOUNTER — Ambulatory Visit: Payer: Medicare Other | Attending: Internal Medicine | Admitting: Internal Medicine

## 2021-06-23 ENCOUNTER — Encounter: Payer: Self-pay | Admitting: Internal Medicine

## 2021-06-23 VITALS — BP 130/77 | HR 81 | Wt 190.0 lb

## 2021-06-23 DIAGNOSIS — E785 Hyperlipidemia, unspecified: Secondary | ICD-10-CM | POA: Diagnosis not present

## 2021-06-23 DIAGNOSIS — I10 Essential (primary) hypertension: Secondary | ICD-10-CM | POA: Diagnosis not present

## 2021-06-23 DIAGNOSIS — E1169 Type 2 diabetes mellitus with other specified complication: Secondary | ICD-10-CM

## 2021-06-23 DIAGNOSIS — M8580 Other specified disorders of bone density and structure, unspecified site: Secondary | ICD-10-CM | POA: Diagnosis not present

## 2021-06-23 DIAGNOSIS — E669 Obesity, unspecified: Secondary | ICD-10-CM

## 2021-06-23 DIAGNOSIS — D472 Monoclonal gammopathy: Secondary | ICD-10-CM | POA: Diagnosis not present

## 2021-06-23 DIAGNOSIS — Z780 Asymptomatic menopausal state: Secondary | ICD-10-CM | POA: Diagnosis not present

## 2021-06-23 DIAGNOSIS — Z6831 Body mass index (BMI) 31.0-31.9, adult: Secondary | ICD-10-CM

## 2021-06-23 LAB — POCT GLYCOSYLATED HEMOGLOBIN (HGB A1C): HbA1c, POC (controlled diabetic range): 7.1 % — AB (ref 0.0–7.0)

## 2021-06-23 LAB — GLUCOSE, POCT (MANUAL RESULT ENTRY): POC Glucose: 185 mg/dl — AB (ref 70–99)

## 2021-06-23 MED ORDER — LISINOPRIL 10 MG PO TABS
10.0000 mg | ORAL_TABLET | Freq: Every day | ORAL | 1 refills | Status: DC
  Filled 2021-06-23 – 2021-07-11 (×2): qty 90, 90d supply, fill #0

## 2021-06-23 MED ORDER — AMLODIPINE BESYLATE 10 MG PO TABS
ORAL_TABLET | Freq: Every day | ORAL | 1 refills | Status: DC
  Filled 2021-06-23: qty 90, 90d supply, fill #0

## 2021-06-23 NOTE — Progress Notes (Signed)
Patient ID: Sandra Shelton, female    DOB: [birth year]  MRN: [id number]  CC: Diabetes   Subjective: Sandra Shelton is a 66 y.o. female who presents for chronic disease management. 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  She has been seeing our clinic pharmacist for f/u on BP.  Med Adherence: _0  Yes    _1  No Medication side effects: _2  Yes    _3  No Adherence with salt restriction: _4  Yes    _5  No Home Monitoring?: _6  Yes    _7  No Monitoring Frequency: daily Home BP results range: 123-140/70-80 SOB? _8  Yes    _9  No Chest Pain?: _10  Yes    _11  No Leg swelling?: _12  Yes    _13  No Headaches?: _14  Yes  Dizziness? _15  Yes    _16  No Comments:   Feels tired at times.  Does snore but wakes feeling refresh  DIABETES TYPE 2 Last A1C:   Results for orders placed or performed in visit on 06/23/21  POCT glucose (manual entry)  Result Value Ref Range   POC Glucose 185 (A) 70 - 99 mg/dl  POCT glycosylated hemoglobin (Hb A1C)  Result Value Ref Range   Hemoglobin A1C     HbA1c POC (<> result, manual entry)     HbA1c, POC (prediabetic range)     HbA1c, POC (controlled diabetic range) 7.1 (A) 0.0 - 7.0 %    Med Adherence:  _17  Yes On last visit we increase Trulicity to 1.5 mg, continue Metformin and decrease Lantus to 3 units.  She stopped the Lantus.  Medication side effects:  _18  Yes    _19  No Home Monitoring?  _20  Yes    2x/day Home glucose results range: 70-120 before BF, before dinner 120-130 Diet Adherence: Doing okay with eating habits.  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 Exercise: _21  Yes but not much due to heat.  Does sit-ups in house Hypoglycemic episodes?: _22  Yes but often     Numbness of the feet? _23  Yes    _24  No Retinopathy  hx? _25  Yes    _26  No Last eye exam: up to date Comments: Compliant with taking and tolerating Crestor.  HyperCa+: Spot of her work-up, she was found to have small spike in M protein with increased IgG kappa light chains.  Saw Dr. Lindi Adie.  Dx with MGUS.  Bone survey negative.  Bone mineral density study showed findings consistent with osteopenia with T score at the femoral neck being -1.3  Patient Active Problem List   Diagnosis Date Noted   MGUS (monoclonal gammopathy of unknown significance) 04/17/2021   Advance directive discussed with patient 11/24/2019   Obesity (BMI 30-39.9) 04/21/2019   Port-A-Cath in place 05/06/2018   Malignant neoplasm of upper-outer quadrant of right breast in female, estrogen receptor positive ([place]) 02/10/2018   Hypercalcemia 12/09/2017   Diabetes mellitus type II, controlled ([place]) 09/02/2017   Essential hypertension 09/02/2017   Left-sided carotid artery disease ([place]) 09/02/2017   Pre-ulcerative corn or callous 01/17/7251   Embolic stroke involving left carotid artery ([place]) 07/24/2017   Cerebral thrombosis with cerebral infarction 07/08/2017     Current Outpatient Medications on File Prior to Visit  Medication Sig Dispense Refill   amLODipine (NORVASC) 10 MG tablet TAKE 1  TABLET (10 MG TOTAL) BY MOUTH DAILY. 90 tablet 0   anastrozole (ARIMIDEX) 1 MG tablet TAKE 1 TABLET BY MOUTH ONCE DAILY 90 tablet 3   Artificial Tear Ointment (DRY EYES OP) Place 1 drop into both eyes daily as needed (Dry eyes).     aspirin 81 MG tablet Take 1 tablet (81 mg total) by mouth daily. 100 tablet 2   blood glucose meter kit and supplies KIT Dispense based on patient and insurance preference. Use up to four times daily as directed. (FOR ICD-9 250.00, 250.01). 1 each 0   CALCIUM PO Take 15 mLs by mouth daily.     clopidogrel (PLAVIX) 75 MG tablet TAKE 1 TABLET (75 MG TOTAL) BY MOUTH DAILY. 30 tablet 6   Dulaglutide (TRULICITY) 1.5 ZO/1.0RU SOPN Inject 1.5 mg into the skin once a  week. 2 mL 5   Elderberry 575 MG/5ML SYRP Take 15 mLs by mouth daily as needed.      Insulin Pen Needle (TRUEPLUS PEN NEEDLES) 32G X 4 MM MISC Use as directed to inject insulin 100 each 3   lisinopril (ZESTRIL) 10 MG tablet Take 1 tablet (10 mg total) by mouth daily. 90 tablet 1   magnesium 30 MG tablet Take 1 tablet (30 mg total) by mouth daily.     metFORMIN (GLUCOPHAGE) 1000 MG tablet TAKE 1 TABLET (1,000 MG TOTAL) BY MOUTH 2 (TWO) TIMES DAILY WITH A MEAL. 180 tablet 1   rosuvastatin (CRESTOR) 40 MG tablet TAKE 1 TABLET (40 MG TOTAL) BY MOUTH DAILY. 90 tablet 1   LANTUS SOLOSTAR 100 UNIT/ML Solostar Pen INJECT 10 UNITS INTO THE SKIN 2 (TWO) TIMES DAILY. 15 mL 6   No current facility-administered medications on file prior to visit.    No Known Allergies  Social History   Socioeconomic History   Marital status: Single    Spouse name: Not on file   Number of children: Not on file   Years of education: Not on file   Highest education level: Not on file  Occupational History   Not on file  Tobacco Use   Smoking status: Never   Smokeless tobacco: Never  Vaping Use   Vaping Use: Never used  Substance and Sexual Activity   Alcohol use: No   Drug use: No   Sexual activity: Not on file  Other Topics Concern   Not on file  Social History Narrative   Lives home alone.  Single.  Education Some college.  No children.     Social Determinants of Health   Financial Resource Strain: Not on file  Food Insecurity: Not on file  Transportation Needs: Not on file  Physical Activity: Not on file  Stress: Not on file  Social Connections: Not on file  Intimate Partner Violence: Not on file    Family History  Problem Relation Age of Onset   Diabetes Mellitus II Sister    Stroke Mother    Diabetes Mellitus II Mother    Prostate cancer Father    Diabetes Mellitus II Brother    Breast cancer Maternal Aunt    Diabetes Maternal Aunt    Colon cancer Neg Hx    Esophageal cancer Neg Hx     Stomach cancer Neg Hx    Pancreatic cancer Neg Hx    Liver disease Neg Hx     Past Surgical History:  Procedure Laterality Date   BREAST EXCISIONAL BIOPSY Right    BREAST LUMPECTOMY Right 03/2018   BREAST LUMPECTOMY WITH  RADIOACTIVE SEED AND SENTINEL LYMPH NODE BIOPSY Right 03/24/2018   Procedure: RIGHT BREAST LUMPECTOMY WITH RADIOACTIVE SEED AND RIGHT AXILLARY DEEP SENTINEL LYMPH NODE BIOPSY INJECT BLUE DYE RIGHT BREAST;  Surgeon: Fanny Skates, MD;  Location: [facility];  Service: General;  Laterality: Right;   COLONOSCOPY     PORT-A-CATH REMOVAL N/A 05/19/2019   Procedure: PORT REMOVAL;  Surgeon: Donnie Mesa, MD;  Location: [facility];  Service: General;  Laterality: N/A;   PORTACATH PLACEMENT N/A 04/09/2018   Procedure: INSERTION PORT-A-CATH WITH ULTRASOUND;  Surgeon: Fanny Skates, MD;  Location: [facility];  Service: General;  Laterality: N/A;   TRANSCAROTID ARTERY REVASCULARIZATION  Left 07/24/2017   Procedure: TRANSCAROTID ARTERY REVASCULARIZATION, left;  Surgeon: Elam Dutch, MD;  Location: [facility];  Service: Vascular;  Laterality: Left;   UTERINE FIBROID SURGERY     2005    ROS: Review of Systems Negative except as stated above  PHYSICAL EXAM: BP 130/77   Pulse 81   Wt 190 lb (86.2 kg)   LMP  (LMP Unknown)   SpO2 99%   BMI 31.62 kg/m   Wt Readings from Last 3 Encounters:  06/23/21 190 lb (86.2 kg)  04/17/21 189 lb 12.8 oz (86.1 kg)  03/23/21 190 lb 3.2 oz (86.3 kg)    Physical Exam  General appearance - alert, well appearing, older African-American female and in no distress Mental status - normal mood, behavior, speech, dress, motor activity, and thought processes Neck - supple, no significant adenopathy Chest - clear to auscultation, no wheezes, rales or rhonchi, symmetric air entry Heart - normal rate, regular rhythm, normal S1, S2, no murmurs, rubs, clicks or gallops Extremities - peripheral pulses normal, no pedal edema, no  clubbing or cyanosis Diabetic Foot Exam - Simple   Simple Foot Form Visual Inspection See comments: Yes Sensation Testing Intact to touch and monofilament testing bilaterally: Yes Pulse Check Posterior Tibialis and Dorsalis pulse intact bilaterally: Yes Comments Patient with calluses on the soles of both feet medially and laterally.  Also on the ball of both big toes and the ball of both heels.  They are not ulcerative.         Latest Ref Rng & Units 03/28/2021   10:31 AM 03/23/2021   10:16 AM 04/01/2020    8:36 AM  CMP  Glucose 70 - 99 mg/dL  93  142   BUN 8 - 27 mg/dL  13  13   Creatinine 0.57 - 1.00 mg/dL  0.72  0.75   Sodium 134 - 144 mmol/L  141  140   Potassium 3.5 - 5.2 mmol/L  4.2  4.2   Chloride 96 - 106 mmol/L  103  101   CO2 20 - 29 mmol/L  22  19   Calcium 8.7 - 10.3 mg/dL 10.7  11.1  10.4   Total Protein 6.0 - 8.5 g/dL 7.8  8.2  7.8   Total Bilirubin 0.0 - 1.2 mg/dL  <0.2  <0.2   Alkaline Phos 44 - 121 IU/L  66  66   AST 0 - 40 IU/L  19  15   ALT 0 - 32 IU/L  23  16    Lipid Panel     Component Value Date/Time   CHOL 147 03/23/2021 1016   TRIG 105 03/23/2021 1016   HDL 51 03/23/2021 1016   CHOLHDL 2.9 03/23/2021 1016   CHOLHDL 8.2 07/09/2017 0646   VLDL 76 (H) 07/09/2017 0646   LDLCALC  77 03/23/2021 1016    CBC    Component Value Date/Time   WBC 7.5 03/23/2021 1016   WBC 6.1 05/19/2019 0730   RBC 4.98 03/23/2021 1016   RBC 4.72 05/19/2019 0730   HGB 13.4 03/23/2021 1016   HCT 41.5 03/23/2021 1016   PLT 324 03/23/2021 1016   MCV 83 03/23/2021 1016   MCH 26.9 03/23/2021 1016   MCH 27.5 05/19/2019 0730   MCHC 32.3 03/23/2021 1016   MCHC 31.9 05/19/2019 0730   RDW 13.6 03/23/2021 1016   LYMPHSABS 1.5 03/10/2019 0845   MONOABS 0.4 03/10/2019 0845   EOSABS 0.1 03/10/2019 0845   BASOSABS 0.0 03/10/2019 0845    ASSESSMENT AND PLAN:  1. Type 2 diabetes mellitus with obesity ([place]) Close to goal. Continue Trulicity and metformin.  Lantus taken  off med list. Encourage healthy eating habits. Encouraged her to try to move more.  Went over ways she can exercise at home like walking in place for 20 to 30 minutes several times a week while watching a program. - POCT glucose (manual entry) - POCT glycosylated hemoglobin (Hb A1C)  2. Essential hypertension At goal.  Continue amlodipine and lisinopril. - amLODipine (NORVASC) 10 MG tablet; TAKE 1 TABLET (10 MG TOTAL) BY MOUTH DAILY.  Dispense: 90 tablet; Refill: 1 - lisinopril (ZESTRIL) 10 MG tablet; Take 1 tablet (10 mg total) by mouth daily.  Dispense: 90 tablet; Refill: 1  3. Hyperlipidemia associated with type 2 diabetes mellitus ([facility]) Continue Crestor.  4. MGUS (monoclonal gammopathy of unknown significance) 5. Hypercalcemia We will continue to monitor her calcium level.   She will follow-up with her oncologist 1 year for monitoring of MGUS.  6. Osteopenia after menopause Discussed the importance of increasing weightbearing exercises.  Encouraged her to try do walking in place at home for 20 to 30 minutes several days a week.   Patient was given the opportunity to ask questions.  Patient verbalized understanding of the plan and was able to repeat key elements of the plan.   This documentation was completed using Radio producer.  Any transcriptional errors are unintentional.  Orders Placed This Encounter  Procedures   POCT glucose (manual entry)   POCT glycosylated hemoglobin (Hb A1C)     Requested Prescriptions    No prescriptions requested or ordered in this encounter    No follow-ups on file.  [name], MD, FACP

## 2021-06-23 NOTE — Patient Instructions (Signed)
Try to increase your physical activity.  You do not need to go outside in the heat to exercise.  Try doing walking in place for 20 minutes several days a week while watching TV.

## 2021-07-03 ENCOUNTER — Ambulatory Visit: Payer: Self-pay

## 2021-07-03 ENCOUNTER — Other Ambulatory Visit: Payer: Self-pay

## 2021-07-03 DIAGNOSIS — E669 Obesity, unspecified: Secondary | ICD-10-CM

## 2021-07-03 NOTE — Telephone Encounter (Signed)
[facility] called, spoke with [place], [facility]. Asked about pt still having refills on file for Trulicity. She verified that pt has 2 refills remaining. Will refuse refill request d/t too soon.

## 2021-07-03 NOTE — Telephone Encounter (Signed)
Requested Prescriptions  Pending Prescriptions Disp Refills  . Dulaglutide (TRULICITY) 1.5 OP/9.2TW SOPN 2 mL 5    Sig: Inject 1.5 mg into the skin once a week.     Endocrinology:  Diabetes - GLP-1 Receptor Agonists Passed - 07/03/2021  9:51 AM      Passed - HBA1C is between 0 and 7.9 and within 180 days    HbA1c, POC (controlled diabetic range)  Date Value Ref Range Status  06/23/2021 7.1 (A) 0.0 - 7.0 % Final         Passed - Valid encounter within last 6 months    Recent Outpatient Visits          1 week ago Type 2 diabetes mellitus with obesity [facility])   [place], MD   1 week ago Essential hypertension   [place], Jarome Matin, RPH-CPP   1 month ago Essential hypertension   [place], Stephen L, RPH-CPP   2 months ago Essential hypertension   [place], Annie Main L, RPH-CPP   3 months ago Type 2 diabetes mellitus with obesity [facility])   [facility] [name], MD

## 2021-07-03 NOTE — Telephone Encounter (Signed)
  Chief Complaint: advice Symptoms: NA Frequency: today Pertinent Negatives: NA Disposition: '[]'$ ED /'[]'$ Urgent Care (no appt availability in office) / '[]'$ Appointment(In office/virtual)/ '[]'$  [facility]/ '[x]'$ Home Care/ '[]'$ Refused Recommended Disposition /'[]'$ [place] [facility]/ '[]'$  Follow-up with PCP Additional Notes: pt states she forgot to take tip off of pen earlier when doing weekly injection and wanted know if she needed to take her Lantus in place of it. I advised her since she didn't get any of the dosage to go ahead and do another injection since it was for the week and doesn't affect the BS. Pt verbalized understanding and said she only as 1 pen left for next week and will need refill. I advised her I will go ahead and see if I can request a refill.   Summary: Truilicty injection   Patient states she forgot to take the "plug" out when administering her trulicity injection today and wasted the medication, patient inquiring if she can take her insulin in place of the trulicity for today.   Please follow up w/ patient      Reason for Disposition  Caller has medicine question only, adult not sick, AND triager answers question  Answer Assessment - Initial Assessment Questions 1. NAME of MEDICATION: "What medicine are you calling about?"     Trulicity inj 2. QUESTION: "What is your question?" (e.g., double dose of medicine, side effect)     Forgot to take tip off pen and wasted medication 3. PRESCRIBING HCP: "Who prescribed it?" Reason: if prescribed by specialist, call should be referred to that group.     Dr. Wynetta Emery  Protocols used: Medication Question Call-A-AH

## 2021-07-11 ENCOUNTER — Other Ambulatory Visit: Payer: Self-pay

## 2021-07-12 ENCOUNTER — Other Ambulatory Visit: Payer: Self-pay

## 2021-07-13 ENCOUNTER — Other Ambulatory Visit: Payer: Self-pay

## 2021-08-07 ENCOUNTER — Other Ambulatory Visit: Payer: Self-pay

## 2021-08-10 ENCOUNTER — Other Ambulatory Visit: Payer: Self-pay

## 2021-08-22 ENCOUNTER — Other Ambulatory Visit: Payer: Self-pay

## 2021-08-23 ENCOUNTER — Other Ambulatory Visit: Payer: Self-pay

## 2021-09-04 ENCOUNTER — Other Ambulatory Visit: Payer: Self-pay | Admitting: Internal Medicine

## 2021-09-04 DIAGNOSIS — E1169 Type 2 diabetes mellitus with other specified complication: Secondary | ICD-10-CM

## 2021-09-05 ENCOUNTER — Other Ambulatory Visit: Payer: Self-pay | Admitting: Pharmacist

## 2021-09-05 ENCOUNTER — Other Ambulatory Visit: Payer: Self-pay

## 2021-09-05 DIAGNOSIS — E1169 Type 2 diabetes mellitus with other specified complication: Secondary | ICD-10-CM

## 2021-09-05 DIAGNOSIS — Z794 Long term (current) use of insulin: Secondary | ICD-10-CM

## 2021-09-05 MED ORDER — METFORMIN HCL 1000 MG PO TABS
1000.0000 mg | ORAL_TABLET | Freq: Two times a day (BID) | ORAL | 1 refills | Status: DC
  Filled 2021-09-05 – 2021-11-13 (×2): qty 180, 90d supply, fill #0

## 2021-09-05 MED ORDER — TRULICITY 1.5 MG/0.5ML ~~LOC~~ SOAJ
1.5000 mg | SUBCUTANEOUS | 1 refills | Status: DC
  Filled 2021-09-05: qty 6, 84d supply, fill #0

## 2021-09-05 MED ORDER — ROSUVASTATIN CALCIUM 40 MG PO TABS
ORAL_TABLET | Freq: Every day | ORAL | 1 refills | Status: DC
  Filled 2021-09-05: qty 90, 90d supply, fill #0

## 2021-09-11 ENCOUNTER — Telehealth: Payer: Self-pay | Admitting: Hematology and Oncology

## 2021-09-11 NOTE — Telephone Encounter (Signed)
Contacted patient to scheduled appointments. Patient is aware of appointments that are scheduled.   

## 2021-09-13 ENCOUNTER — Ambulatory Visit: Payer: Medicare Other | Admitting: Hematology and Oncology

## 2021-09-19 ENCOUNTER — Other Ambulatory Visit: Payer: Self-pay

## 2021-09-22 ENCOUNTER — Other Ambulatory Visit: Payer: Self-pay

## 2021-11-11 NOTE — Progress Notes (Signed)
Patient Care Team: [name], MD as PCP - General (Internal Medicine) Fanny Skates, MD as Consulting Physician (General Surgery) Nicholas Lose, MD as Consulting Physician (Hematology and Oncology) Gery Pray, MD as Consulting Physician (Radiation Oncology) Rockwell Germany, RN as Oncology Nurse Navigator Mauro Kaufmann, RN as Oncology Nurse Navigator  DIAGNOSIS: No diagnosis found.  SUMMARY OF ONCOLOGIC HISTORY: Oncology History  Malignant neoplasm of upper-outer quadrant of right breast in female, estrogen receptor positive ([place])  01/27/2018 Mammogram   Screening Mammogram  Possible right breast mass; asymmetry in left breast.   02/10/2018 Initial Diagnosis   Screening mammogram detected right breast mass and asymmetry in the left breast.  The left breast asymmetry resolved.  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%, HER-2 +3+ by IHC, T1BN0 stage Ia clinical stage   03/24/2018 Surgery   Lumpectomy and SLNB: IDC, 1.2cm, grade 2, margins negative, 4 SLN negative, T1cN0, previously ER/PR/HER-2 positive with Ki-67 of 20%   03/24/2018 Cancer Staging   Staging form: Breast, AJCC 8th Edition - Pathologic stage from 03/24/2018: Stage IA (pT1c, pN0, cM0, G2, ER+, PR+, HER2+) - Signed by Gardenia Phlegm, NP on 03/26/2018   04/22/2018 - 06/24/2018 Chemotherapy   The patient had trastuzumab (HERCEPTIN) 336 mg in sodium chloride 0.9 % 250 mL chemo infusion, 4 mg/kg = 336 mg, Intravenous,  Once, 3 of 16 cycles Administration: 336 mg (04/22/2018), 168 mg (04/29/2018), 168 mg (05/21/2018), 168 mg (05/06/2018), 168 mg (05/13/2018), 168 mg (05/27/2018), 168 mg (06/03/2018), 168 mg (06/10/2018), 168 mg (06/17/2018), 168 mg (06/24/2018) PACLitaxel (TAXOL) 156 mg in sodium chloride 0.9 % 250 mL chemo infusion (</= 4m/m2), 80 mg/m2 = 156 mg, Intravenous,  Once, 3 of 3  cycles Administration: 156 mg (04/22/2018), 156 mg (04/29/2018), 156 mg (05/21/2018), 156 mg (05/06/2018), 156 mg (05/13/2018), 156 mg (05/27/2018), 156 mg (06/03/2018), 156 mg (06/10/2018), 156 mg (06/17/2018), 156 mg (06/24/2018)  for chemotherapy treatment.    07/01/2018 - 03/31/2019 Chemotherapy         07/29/2018 - 08/22/2018 Radiation Therapy   Adjuvant XRT   09/05/2018 -  Anti-estrogen oral therapy   Anastrozole 122mdaily, plan for 5-7 years      CHIEF COMPLIANT: Follow-up of right breast cancer on anastrozole     INTERVAL HISTORY: Sandra Shelton a  [age].o. with above-mentioned history of right breast cancer who underwent a lumpectomy, adjuvant chemotherapy, radiation, [name], and is currently on anti-estrogen therapy with anastrozole. Mammogram on 04/04/2020 showed no evidence of malignancy. She presents to the clinic today for follow-up.   ALLERGIES:  has No Known Allergies.  MEDICATIONS:  Current Outpatient Medications  Medication Sig Dispense Refill   amLODipine (NORVASC) 10 MG tablet TAKE 1 TABLET (10 MG TOTAL) BY MOUTH DAILY. 90 tablet 1   anastrozole (ARIMIDEX) 1 MG tablet TAKE 1 TABLET BY MOUTH ONCE DAILY 90 tablet 3   Artificial Tear Ointment (DRY EYES OP) Place 1 drop into both eyes daily as needed (Dry eyes).     aspirin 81 MG tablet Take 1 tablet (81 mg total) by mouth daily. 100 tablet 2   blood glucose meter kit and supplies KIT Dispense based on patient and insurance preference. Use up to four times daily as directed. (FOR ICD-9 250.00, 250.01). 1 each 0   CALCIUM PO Take 15 mLs by mouth daily.     clopidogrel (  PLAVIX) 75 MG tablet TAKE 1 TABLET (75 MG TOTAL) BY MOUTH DAILY. 30 tablet 6   Dulaglutide (TRULICITY) 1.5 MG/0.5ML SOPN Inject 1.5 mg into the skin once a week. 6 mL 1   Elderberry 575 MG/5ML SYRP Take 15 mLs by mouth daily as needed.      Insulin Pen Needle (TRUEPLUS PEN NEEDLES) 32G X 4 MM MISC Use as directed to inject insulin 100 each 3    lisinopril (ZESTRIL) 10 MG tablet Take 1 tablet (10 mg total) by mouth daily. 90 tablet 1   magnesium 30 MG tablet Take 1 tablet (30 mg total) by mouth daily.     metFORMIN (GLUCOPHAGE) 1000 MG tablet TAKE 1 TABLET (1,000 MG TOTAL) BY MOUTH 2 (TWO) TIMES DAILY WITH A MEAL. 180 tablet 1   rosuvastatin (CRESTOR) 40 MG tablet TAKE 1 TABLET (40 MG TOTAL) BY MOUTH DAILY. 90 tablet 1   No current facility-administered medications for this visit.    PHYSICAL EXAMINATION: ECOG PERFORMANCE STATUS: {CHL ONC ECOG PS:[id number]}  There were no vitals filed for this visit. There were no vitals filed for this visit.  BREAST:*** No palpable masses or nodules in either right or left breasts. No palpable axillary supraclavicular or infraclavicular adenopathy no breast tenderness or nipple discharge. (exam performed in the presence of a chaperone)  LABORATORY DATA:  I have reviewed the data as listed    Latest Ref Rng & Units 03/28/2021   10:31 AM 03/23/2021   10:16 AM 04/01/2020    8:36 AM  CMP  Glucose 70 - 99 mg/dL  93  142   BUN 8 - 27 mg/dL  13  13   Creatinine 0.57 - 1.00 mg/dL  0.72  0.75   Sodium 134 - 144 mmol/L  141  140   Potassium 3.5 - 5.2 mmol/L  4.2  4.2   Chloride 96 - 106 mmol/L  103  101   CO2 20 - 29 mmol/L  22  19   Calcium 8.7 - 10.3 mg/dL 10.7  11.1  10.4   Total Protein 6.0 - 8.5 g/dL 7.8  8.2  7.8   Total Bilirubin 0.0 - 1.2 mg/dL  <0.2  <0.2   Alkaline Phos 44 - 121 IU/L  66  66   AST 0 - 40 IU/L  19  15   ALT 0 - 32 IU/L  23  16     Lab Results  Component Value Date   WBC 7.5 03/23/2021   HGB 13.4 03/23/2021   HCT 41.5 03/23/2021   MCV 83 03/23/2021   PLT 324 03/23/2021   NEUTROABS 4.3 03/10/2019    ASSESSMENT & PLAN:  No problem-specific Assessment & Plan notes found for this encounter.    No orders of the defined types were placed in this encounter.  The patient has a good understanding of the overall plan. she agrees with it. she will call with any  problems that may develop before the next visit here. Total time spent: 30 mins including face to face time and time spent for planning, charting and co-ordination of care   [name], CMA 11/11/21    I Deritra, Mcnairy am scribing for Dr. Gudena  ***  

## 2021-11-13 ENCOUNTER — Other Ambulatory Visit: Payer: Self-pay

## 2021-11-14 ENCOUNTER — Other Ambulatory Visit: Payer: Self-pay

## 2021-11-14 ENCOUNTER — Encounter: Payer: Self-pay | Admitting: Internal Medicine

## 2021-11-14 ENCOUNTER — Ambulatory Visit: Payer: Medicare Other | Attending: Internal Medicine | Admitting: Internal Medicine

## 2021-11-14 VITALS — BP 134/80 | HR 79 | Ht 65.0 in | Wt 178.2 lb

## 2021-11-14 DIAGNOSIS — Z6829 Body mass index (BMI) 29.0-29.9, adult: Secondary | ICD-10-CM

## 2021-11-14 DIAGNOSIS — E1169 Type 2 diabetes mellitus with other specified complication: Secondary | ICD-10-CM | POA: Diagnosis not present

## 2021-11-14 DIAGNOSIS — Z23 Encounter for immunization: Secondary | ICD-10-CM | POA: Diagnosis not present

## 2021-11-14 DIAGNOSIS — E669 Obesity, unspecified: Secondary | ICD-10-CM

## 2021-11-14 DIAGNOSIS — E785 Hyperlipidemia, unspecified: Secondary | ICD-10-CM

## 2021-11-14 DIAGNOSIS — J069 Acute upper respiratory infection, unspecified: Secondary | ICD-10-CM | POA: Diagnosis not present

## 2021-11-14 DIAGNOSIS — I152 Hypertension secondary to endocrine disorders: Secondary | ICD-10-CM

## 2021-11-14 DIAGNOSIS — E1159 Type 2 diabetes mellitus with other circulatory complications: Secondary | ICD-10-CM | POA: Diagnosis not present

## 2021-11-14 DIAGNOSIS — L84 Corns and callosities: Secondary | ICD-10-CM

## 2021-11-14 LAB — POCT GLYCOSYLATED HEMOGLOBIN (HGB A1C): HbA1c, POC (controlled diabetic range): 6.9 % (ref 0.0–7.0)

## 2021-11-14 LAB — GLUCOSE, POCT (MANUAL RESULT ENTRY): POC Glucose: 94 mg/dl (ref 70–99)

## 2021-11-14 MED ORDER — ROSUVASTATIN CALCIUM 40 MG PO TABS
40.0000 mg | ORAL_TABLET | Freq: Every day | ORAL | 2 refills | Status: DC
  Filled 2021-11-14: qty 90, fill #0
  Filled 2021-11-29: qty 90, 90d supply, fill #0
  Filled 2022-03-02: qty 90, 90d supply, fill #1
  Filled 2022-06-12: qty 90, 90d supply, fill #2

## 2021-11-14 MED ORDER — TRULICITY 1.5 MG/0.5ML ~~LOC~~ SOAJ
1.5000 mg | SUBCUTANEOUS | 2 refills | Status: DC
  Filled 2021-11-14 – 2021-11-29 (×2): qty 6, 84d supply, fill #0
  Filled 2022-02-12: qty 6, 84d supply, fill #1
  Filled 2022-02-12: qty 2, 28d supply, fill #1
  Filled 2022-03-15: qty 2, 28d supply, fill #2

## 2021-11-14 MED ORDER — LISINOPRIL 10 MG PO TABS
10.0000 mg | ORAL_TABLET | Freq: Every day | ORAL | 2 refills | Status: DC
  Filled 2021-11-14 – 2021-12-21 (×2): qty 90, 90d supply, fill #0

## 2021-11-14 MED ORDER — AMLODIPINE BESYLATE 10 MG PO TABS
ORAL_TABLET | Freq: Every day | ORAL | 2 refills | Status: DC
  Filled 2021-11-14: qty 30, 30d supply, fill #0
  Filled 2021-12-21: qty 90, 90d supply, fill #0
  Filled 2022-03-23: qty 90, 90d supply, fill #1
  Filled 2022-06-12: qty 90, 90d supply, fill #2

## 2021-11-14 MED ORDER — METFORMIN HCL 1000 MG PO TABS
1000.0000 mg | ORAL_TABLET | Freq: Two times a day (BID) | ORAL | 2 refills | Status: DC
  Filled 2022-02-09: qty 180, 90d supply, fill #0
  Filled 2022-05-10: qty 180, 90d supply, fill #1
  Filled 2022-08-06: qty 180, 90d supply, fill #2

## 2021-11-14 MED ORDER — CLOPIDOGREL BISULFATE 75 MG PO TABS
75.0000 mg | ORAL_TABLET | Freq: Every day | ORAL | 3 refills | Status: DC
  Filled 2021-11-14: qty 30, 30d supply, fill #0
  Filled 2021-12-11: qty 30, 30d supply, fill #1

## 2021-11-14 NOTE — Progress Notes (Unsigned)
Patient ID: Sandra Shelton, female    DOB: [birth year]  MRN: [id number]  CC: Medication Refill, Hypertension, and Diabetes   Subjective: Sandra Shelton is a 66 y.o. 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  Came back 1 wk ago.  Had bad cold before she left.  COVID test was neg.  Since then gets nasal congestion in evenings, no SOB, no fever.  Dry cough.  She was using cough syrup from her sister  DM: Results for orders placed or performed in visit on 11/14/21  POCT glucose (manual entry)  Result Value Ref Range   POC Glucose 94 70 - 99 mg/dl  POCT glycosylated hemoglobin (Hb A1C)  Result Value Ref Range   Hemoglobin A1C     HbA1c POC (<> result, manual entry)     HbA1c, POC (prediabetic range)     HbA1c, POC (controlled diabetic range) 6.9 0.0 - 7.0 %  -down 12 lbs since last visit with me. Dec appetite a little with Trulicity.  Tolerating Trulicity; taking Metformin -she was walking daily while in [place]. BP elev today.  Was not checking BP while in [place].  Compliant with meds and took already this a.m. no chest pain, shortness of breath or lower extremity edema HL:  compliant with and tolerating Crestor  Osteopenia: taking Ca+Vit daily  HM:  due for flu shot and RSV.  Due for [place].   Patient Active Problem List   Diagnosis Date Noted   Osteopenia after menopause 06/23/2021   MGUS (monoclonal gammopathy of unknown significance) 04/17/2021   Advance directive discussed with patient 11/24/2019   Obesity (BMI 30-39.9) 04/21/2019   Port-A-Cath in place 05/06/2018   Malignant neoplasm of upper-outer quadrant of right breast in female, estrogen receptor positive ([place]) 02/10/2018   Hypercalcemia 12/09/2017   Diabetes mellitus type II,  controlled ([place]) 09/02/2017   Essential hypertension 09/02/2017   Left-sided carotid artery disease ([place]) 09/02/2017   Pre-ulcerative corn or callous 29/93/7169   Embolic stroke involving left carotid artery ([place]) 07/24/2017   Cerebral thrombosis with cerebral infarction 07/08/2017     Current Outpatient Medications on File Prior to Visit  Medication Sig Dispense Refill   anastrozole (ARIMIDEX) 1 MG tablet TAKE 1 TABLET BY MOUTH ONCE DAILY 90 tablet 3   aspirin 81 MG tablet Take 1 tablet (81 mg total) by mouth daily. 100 tablet 2   blood glucose meter kit and supplies KIT Dispense based on patient and insurance preference. Use up to four times daily as directed. (FOR ICD-9 250.00, 250.01). 1 each 0   CALCIUM PO Take 15 mLs by mouth daily.     clopidogrel (PLAVIX) 75 MG tablet TAKE 1 TABLET (75 MG TOTAL) BY MOUTH DAILY. 30 tablet 6   Elderberry 575 MG/5ML SYRP Take 15 mLs by mouth daily as needed.      Insulin Pen Needle (TRUEPLUS PEN NEEDLES) 32G X 4 MM MISC Use as directed to inject insulin 100 each 3   magnesium 30 MG tablet Take 1 tablet (30 mg total) by mouth daily.     No current facility-administered medications on file prior to visit.    No Known Allergies  Social History   Socioeconomic History   Marital status: Single  Spouse name: Not on file   Number of children: Not on file   Years of education: Not on file   Highest education level: Not on file  Occupational History   Not on file  Tobacco Use   Smoking status: Never   Smokeless tobacco: Never  Vaping Use   Vaping Use: Never used  Substance and Sexual Activity   Alcohol use: No   Drug use: No   Sexual activity: Not on file  Other Topics Concern   Not on file  Social History Narrative   Lives home alone.  Single.  Education Some college.  No children.     Social Determinants of Health   Financial Resource Strain: Not on file  Food Insecurity: Not on file  Transportation Needs: Not on file  Physical  Activity: Not on file  Stress: Not on file  Social Connections: Not on file  Intimate Partner Violence: Not on file    Family History  Problem Relation Age of Onset   Diabetes Mellitus II Sister    Stroke Mother    Diabetes Mellitus II Mother    Prostate cancer Father    Diabetes Mellitus II Brother    Breast cancer Maternal Aunt    Diabetes Maternal Aunt    Colon cancer Neg Hx    Esophageal cancer Neg Hx    Stomach cancer Neg Hx    Pancreatic cancer Neg Hx    Liver disease Neg Hx     Past Surgical History:  Procedure Laterality Date   BREAST EXCISIONAL BIOPSY Right    BREAST LUMPECTOMY Right 03/2018   BREAST LUMPECTOMY WITH RADIOACTIVE SEED AND SENTINEL LYMPH NODE BIOPSY Right 03/24/2018   Procedure: RIGHT BREAST LUMPECTOMY WITH RADIOACTIVE SEED AND RIGHT AXILLARY DEEP SENTINEL LYMPH NODE BIOPSY INJECT BLUE DYE RIGHT BREAST;  Surgeon: Fanny Skates, MD;  Location: [facility];  Service: General;  Laterality: Right;   COLONOSCOPY     PORT-A-CATH REMOVAL N/A 05/19/2019   Procedure: PORT REMOVAL;  Surgeon: Donnie Mesa, MD;  Location: [facility];  Service: General;  Laterality: N/A;   PORTACATH PLACEMENT N/A 04/09/2018   Procedure: INSERTION PORT-A-CATH WITH ULTRASOUND;  Surgeon: Fanny Skates, MD;  Location: [facility];  Service: General;  Laterality: N/A;   TRANSCAROTID ARTERY REVASCULARIZATION  Left 07/24/2017   Procedure: TRANSCAROTID ARTERY REVASCULARIZATION, left;  Surgeon: Elam Dutch, MD;  Location: [facility];  Service: Vascular;  Laterality: Left;   UTERINE FIBROID SURGERY     2005    ROS: Review of Systems Negative except as stated above  PHYSICAL EXAM: BP 134/80   Pulse 79   Ht _0  (1.651 m)   Wt 178 lb 3.2 oz (80.8 kg)   LMP  (LMP Unknown)   SpO2 100%   BMI 29.65 kg/m   Wt Readings from Last 3 Encounters:  11/15/21 176 lb 9.6 oz (80.1 kg)  11/14/21 178 lb 3.2 oz (80.8 kg)  06/23/21 190 lb (86.2 kg)    Physical  Exam  General appearance - alert, well appearing, older AAF and in no distress Mental status - normal mood, behavior, speech, dress, motor activity, and thought processes Neck - supple, no significant adenopathy Chest - clear to auscultation, no wheezes, rales or rhonchi, symmetric air entry Heart - normal rate, regular rhythm, normal S1, S2, no murmurs, rubs, clicks or gallops Extremities - peripheral pulses normal, no pedal edema, no clubbing or cyanosis Diabetic Foot Exam - Simple   Simple Foot Form  Visual Inspection See comments: Yes Sensation Testing See comments: Yes Pulse Check Posterior Tibialis and Dorsalis pulse intact bilaterally: Yes Comments Dime to nickel size calluses on the ball of both feet medially and laterally.         Latest Ref Rng & Units 03/28/2021   10:31 AM 03/23/2021   10:16 AM 04/01/2020    8:36 AM  CMP  Glucose 70 - 99 mg/dL  93  142   BUN 8 - 27 mg/dL  13  13   Creatinine 0.57 - 1.00 mg/dL  0.72  0.75   Sodium 134 - 144 mmol/L  141  140   Potassium 3.5 - 5.2 mmol/L  4.2  4.2   Chloride 96 - 106 mmol/L  103  101   CO2 20 - 29 mmol/L  22  19   Calcium 8.7 - 10.3 mg/dL 10.7  11.1  10.4   Total Protein 6.0 - 8.5 g/dL 7.8  8.2  7.8   Total Bilirubin 0.0 - 1.2 mg/dL  <0.2  <0.2   Alkaline Phos 44 - 121 IU/L  66  66   AST 0 - 40 IU/L  19  15   ALT 0 - 32 IU/L  23  16    Lipid Panel     Component Value Date/Time   CHOL 147 03/23/2021 1016   TRIG 105 03/23/2021 1016   HDL 51 03/23/2021 1016   CHOLHDL 2.9 03/23/2021 1016   CHOLHDL 8.2 07/09/2017 0646   VLDL 76 (H) 07/09/2017 0646   LDLCALC 77 03/23/2021 1016    CBC    Component Value Date/Time   WBC 7.5 03/23/2021 1016   WBC 6.1 05/19/2019 0730   RBC 4.98 03/23/2021 1016   RBC 4.72 05/19/2019 0730   HGB 13.4 03/23/2021 1016   HCT 41.5 03/23/2021 1016   PLT 324 03/23/2021 1016   MCV 83 03/23/2021 1016   MCH 26.9 03/23/2021 1016   MCH 27.5 05/19/2019 0730   MCHC 32.3 03/23/2021 1016    MCHC 31.9 05/19/2019 0730   RDW 13.6 03/23/2021 1016   LYMPHSABS 1.5 03/10/2019 0845   MONOABS 0.4 03/10/2019 0845   EOSABS 0.1 03/10/2019 0845   BASOSABS 0.0 03/10/2019 0845    ASSESSMENT AND PLAN: 1. Type 2 diabetes mellitus with other circulatory complications (HCC) At goal. Continue healthy eating and regular exercise.  Continue metformin and Trulicity at current doses. - POCT glucose (manual entry) - POCT glycosylated hemoglobin (Hb A1C) - Microalbumin / creatinine urine ratio - metFORMIN (GLUCOPHAGE) 1000 MG tablet; Take 1 tablet (1,000 mg total) by mouth 2 (two) times daily with a meal.  Dispense: 180 tablet; Refill: 2 - Dulaglutide (TRULICITY) 1.5 VC/9.4WH SOPN; Inject 1.5 mg into the skin once a week.  Dispense: 6 mL; Refill: 2  2. Hyperlipidemia associated with type 2 diabetes mellitus (HCC) Continue Crestor. - rosuvastatin (CRESTOR) 40 MG tablet; TAKE 1 TABLET (40 MG TOTAL) BY MOUTH DAILY.  Dispense: 90 tablet; Refill: 2  3. Hypertension associated with type 2 diabetes mellitus ([place]) Dose to goal.  Continue lisinopril 10 mg and Norvasc 10 mg daily. - lisinopril (ZESTRIL) 10 MG tablet; Take 1 tablet (10 mg total) by mouth daily.  Dispense: 90 tablet; Refill: 2 - amLODipine (NORVASC) 10 MG tablet; TAKE 1 TABLET (10 MG TOTAL) BY MOUTH DAILY.  Dispense: 90 tablet; Refill: 2  4. Viral URI with cough Seems to be on the tail end of this.  Recommend over-the-counter cough syrup  5. Pre-ulcerative corn  or callous Keep upcoming appt with podiatry  6. Need for immunization against influenza - Flu Vaccine QUAD 55moIM (Fluarix, Fluzone & Alfiuria Quad PF)     Patient was given the opportunity to ask questions.  Patient verbalized understanding of the plan and was able to repeat key elements of the plan.   This documentation was completed using DRadio producer  Any transcriptional errors are unintentional.  Orders Placed This Encounter  Procedures   Flu  Vaccine QUAD 666moM (Fluarix, Fluzone & Alfiuria Quad PF)   Microalbumin / creatinine urine ratio   POCT glucose (manual entry)   POCT glycosylated hemoglobin (Hb A1C)     Requested Prescriptions   Signed Prescriptions Disp Refills   rosuvastatin (CRESTOR) 40 MG tablet 90 tablet 2    Sig: TAKE 1 TABLET (40 MG TOTAL) BY MOUTH DAILY.   lisinopril (ZESTRIL) 10 MG tablet 90 tablet 2    Sig: Take 1 tablet (10 mg total) by mouth daily.   amLODipine (NORVASC) 10 MG tablet 90 tablet 2    Sig: TAKE 1 TABLET (10 MG TOTAL) BY MOUTH DAILY.   metFORMIN (GLUCOPHAGE) 1000 MG tablet 180 tablet 2    Sig: Take 1 tablet (1,000 mg total) by mouth 2 (two) times daily with a meal.   Dulaglutide (TRULICITY) 1.5 MGGB/6.1OMOPN 6 mL 2    Sig: Inject 1.5 mg into the skin once a week.    Return in about 4 months (around 03/15/2022) for Give appt in 1 mth with CMA for [facility] Visit.. Karle PlumberMD, FACP

## 2021-11-15 ENCOUNTER — Other Ambulatory Visit: Payer: Self-pay

## 2021-11-15 ENCOUNTER — Inpatient Hospital Stay: Payer: Medicare Other | Attending: Hematology and Oncology | Admitting: Hematology and Oncology

## 2021-11-15 DIAGNOSIS — C50411 Malignant neoplasm of upper-outer quadrant of right female breast: Secondary | ICD-10-CM | POA: Diagnosis not present

## 2021-11-15 DIAGNOSIS — Z79811 Long term (current) use of aromatase inhibitors: Secondary | ICD-10-CM | POA: Diagnosis not present

## 2021-11-15 DIAGNOSIS — Z170 Estrogen receptor positive status [ER+]: Secondary | ICD-10-CM

## 2021-11-15 DIAGNOSIS — D472 Monoclonal gammopathy: Secondary | ICD-10-CM | POA: Diagnosis not present

## 2021-11-15 DIAGNOSIS — I10 Essential (primary) hypertension: Secondary | ICD-10-CM | POA: Diagnosis not present

## 2021-11-15 DIAGNOSIS — Z853 Personal history of malignant neoplasm of breast: Secondary | ICD-10-CM | POA: Insufficient documentation

## 2021-11-15 LAB — MICROALBUMIN / CREATININE URINE RATIO
Creatinine, Urine: 200.2 mg/dL
Microalb/Creat Ratio: 29 mg/g creat (ref 0–29)
Microalbumin, Urine: 58 ug/mL

## 2021-11-15 NOTE — Assessment & Plan Note (Signed)
Hypercalcemia work-up revealed serum protein electrophoresis with 0.3 g of M protein. Kappa: 25.3, kappa lambda ratio 2.94, Calcium 10.7, PTH 26: Normal, PTH RP less than 2, TSH 0.585, vitamin D30.1  Bone survey 04/15/2021: No lytic lesions  Labs once a year and follow-up

## 2021-11-15 NOTE — Assessment & Plan Note (Signed)
02/04/2018:Screening mammogram detected right breast mass and asymmetry in the left breast. The left breast asymmetry resolved. 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 Adj chemo with Taxol Herceptin weekly X 11 (stopped for peripheral neuropathy)started 04/22/2018-07/01/2018,now on [name] to complete 1 full year of therapycompleted March 2021 2. Adjuvant radiation therapy completed 08/22/2018 3. Adjuvant antiestrogen therapy started 09/05/2018 ------------------------------------------------------------------------------------------------------------------------ Current treatment:Anastrozole 1 mg daily started 09/05/2018 Anastrozole toxicities:Mild hot flashes and muscle stiffness but otherwise doing quite well She is due for a bone density test next month. Hypertension: Being managed by her PCP.  She is worried about her blood pressure readings.  Breast cancer surveillance: 1.Breast exam  11/15/2021: Benign 2.Mammograms 04/06/2021: Benign breast density category B 3.Bone density 05/22/2021: T score -1.3: Normal  Return to clinic in 1 year for follow-up

## 2021-11-17 ENCOUNTER — Other Ambulatory Visit: Payer: Self-pay

## 2021-11-20 ENCOUNTER — Encounter: Payer: Self-pay | Admitting: Podiatry

## 2021-11-20 ENCOUNTER — Ambulatory Visit (INDEPENDENT_AMBULATORY_CARE_PROVIDER_SITE_OTHER): Payer: Medicare Other | Admitting: Podiatry

## 2021-11-20 DIAGNOSIS — E1165 Type 2 diabetes mellitus with hyperglycemia: Secondary | ICD-10-CM | POA: Diagnosis not present

## 2021-11-20 DIAGNOSIS — E119 Type 2 diabetes mellitus without complications: Secondary | ICD-10-CM

## 2021-11-20 DIAGNOSIS — M2142 Flat foot [pes planus] (acquired), left foot: Secondary | ICD-10-CM | POA: Diagnosis not present

## 2021-11-20 DIAGNOSIS — M2141 Flat foot [pes planus] (acquired), right foot: Secondary | ICD-10-CM | POA: Diagnosis not present

## 2021-11-20 DIAGNOSIS — Z794 Long term (current) use of insulin: Secondary | ICD-10-CM | POA: Diagnosis not present

## 2021-11-20 DIAGNOSIS — L84 Corns and callosities: Secondary | ICD-10-CM

## 2021-11-20 NOTE — Progress Notes (Signed)
  Subjective:  Patient ID: Sandra Shelton, female    DOB: November 30, 1955,  MRN: [id number]  Chief Complaint  Patient presents with   Diabetes    Annual diabetic foot exam, callus trim    66 y.o. female presents with the above complaint. History confirmed with patient.  She is doing well she has had a significant improvement in her A1c is close to 6.9% now.  The calluses have thickened again  Objective:  Physical Exam: warm, good capillary refill, no trophic changes or ulcerative lesions, normal DP and PT pulses, normal monofilament exam, normal sensory exam, and multiple hyperkeratotic lesions including bilateral plantar heel submetatarsal 1 and 5 and plantar hallux IPJ.  Hammertoes and pes planus bilateral  Assessment:   1. Callus of foot   2. Controlled type 2 diabetes mellitus with hyperglycemia, with long-term current use of insulin ([place])   3. Encounter for comprehensive diabetic foot examination, type 2 diabetes mellitus ([place])   4. Pes planus of both feet      Plan:  Patient was evaluated and treated and all questions answered.  Patient educated on diabetes. Discussed proper diabetic foot care and discussed risks and complications of disease. Educated patient in depth on reasons to return to the office immediately should he/she discover anything concerning or new on the feet. All questions answered. Discussed proper shoes as well.  Annual at risk diabetic foot exam was performed today  All symptomatic hyperkeratoses were safely debrided with a sterile #15 blade to patient's level of comfort without incident. We discussed preventative and palliative care of these lesions including supportive and accommodative shoegear, padding, prefabricated and custom molded accommodative orthoses, use of a pumice stone and lotions/creams daily.  Urea cream has been helpful she will continue using this at home   Return in about 1 year (around 11/21/2022) for diabetic foot exam, calluses .

## 2021-11-29 ENCOUNTER — Other Ambulatory Visit: Payer: Self-pay

## 2021-11-30 ENCOUNTER — Other Ambulatory Visit: Payer: Self-pay

## 2021-12-01 ENCOUNTER — Ambulatory Visit: Payer: Medicare Other | Attending: Internal Medicine | Admitting: *Deleted

## 2021-12-01 DIAGNOSIS — Z0000 Encounter for general adult medical examination without abnormal findings: Secondary | ICD-10-CM | POA: Diagnosis not present

## 2021-12-01 NOTE — Patient Instructions (Addendum)
Sandra Shelton , Thank you for taking time to come for your Medicare Wellness Visit. I appreciate your ongoing commitment to your health goals. Please review the following plan we discussed and let me know if I can assist you in the future.   These are the goals we discussed:  Goals      Weight (lb) < 200 lb (90.7 kg)     I want to get healthy through losing 30-40 lbs        This is a list of the screening recommended for you and due dates:  Health Maintenance  Topic Date Due   COVID-19 Vaccine (3 - Moderna risk series) 06/03/2020   Eye exam for diabetics  11/24/2021   Yearly kidney function blood test for diabetes  03/24/2022   Hemoglobin A1C  05/15/2022   Pneumonia Vaccine (3 - PPSV23 or PCV20) 09/03/2022   Yearly kidney health urinalysis for diabetes  11/15/2022   Complete foot exam   11/21/2022   Medicare Annual Wellness Visit  12/02/2022   Mammogram  04/06/2023   Colon Cancer Screening  07/20/2029   Flu Shot  Completed   DEXA scan (bone density measurement)  Completed   Hepatitis C Screening: USPSTF Recommendation to screen - Ages 16-79 yo.  Completed   Zoster (Shingles) Vaccine  Completed   HPV Vaccine  Aged Out        Health Maintenance, Female Adopting a healthy lifestyle and getting preventive care are important in promoting health and wellness. Ask your health care provider about: The right schedule for you to have regular tests and exams. Things you can do on your own to prevent diseases and keep yourself healthy. What should I know about diet, weight, and exercise? Eat a healthy diet  Eat a diet that includes plenty of vegetables, fruits, low-fat dairy products, and lean protein. Do not eat a lot of foods that are high in solid fats, added sugars, or sodium. Maintain a healthy weight Body mass index (BMI) is used to identify weight problems. It estimates body fat based on height and weight. Your health care provider can help determine your BMI and help you  achieve or maintain a healthy weight. Get regular exercise Get regular exercise. This is one of the most important things you can do for your health. Most adults should: Exercise for at least 150 minutes each week. The exercise should increase your heart rate and make you sweat (moderate-intensity exercise). Do strengthening exercises at least twice a week. This is in addition to the moderate-intensity exercise. Spend less time sitting. Even light physical activity can be beneficial. Watch cholesterol and blood lipids Have your blood tested for lipids and cholesterol at 68 years of age, then have this test every 5 years. Have your cholesterol levels checked more often if: Your lipid or cholesterol levels are high. You are older than 68 years of age. You are at high risk for heart disease. What should I know about cancer screening? Depending on your health history and family history, you may need to have cancer screening at various ages. This may include screening for: Breast cancer. Cervical cancer. Colorectal cancer. Skin cancer. Lung cancer. What should I know about heart disease, diabetes, and high blood pressure? Blood pressure and heart disease High blood pressure causes heart disease and increases the risk of stroke. This is more likely to develop in people who have high blood pressure readings or are overweight. Have your blood pressure checked: Every 3-5 years if you are  75-32 years of age. Every year if you are 63 years old or older. Diabetes Have regular diabetes screenings. This checks your fasting blood sugar level. Have the screening done: Once every three years after age 23 if you are at a normal weight and have a low risk for diabetes. More often and at a younger age if you are overweight or have a high risk for diabetes. What should I know about preventing infection? Hepatitis B If you have a higher risk for hepatitis B, you should be screened for this virus. Talk with  your health care provider to find out if you are at risk for hepatitis B infection. Hepatitis C Testing is recommended for: Everyone born from [birth year] through 1967. Anyone with known risk factors for hepatitis C. Sexually transmitted infections (STIs) Get screened for STIs, including gonorrhea and chlamydia, if: You are sexually active and are younger than 68 years of age. You are older than 68 years of age and your health care provider tells you that you are at risk for this type of infection. Your sexual activity has changed since you were last screened, and you are at increased risk for chlamydia or gonorrhea. Ask your health care provider if you are at risk. Ask your health care provider about whether you are at high risk for HIV. Your health care provider may recommend a prescription medicine to help prevent HIV infection. If you choose to take medicine to prevent HIV, you should first get tested for HIV. You should then be tested every 3 months for as long as you are taking the medicine. Pregnancy If you are about to stop having your period (premenopausal) and you may become pregnant, seek counseling before you get pregnant. Take 400 to 800 micrograms (mcg) of folic acid every day if you become pregnant. Ask for birth control (contraception) if you want to prevent pregnancy. Osteoporosis and menopause Osteoporosis is a disease in which the bones lose minerals and strength with aging. This can result in bone fractures. If you are 41 years old or older, or if you are at risk for osteoporosis and fractures, ask your health care provider if you should: Be screened for bone loss. Take a calcium or vitamin D supplement to lower your risk of fractures. Be given hormone replacement therapy (HRT) to treat symptoms of menopause. Follow these instructions at home: Alcohol use Do not drink alcohol if: Your health care provider tells you not to drink. You are pregnant, may be pregnant, or are planning  to become pregnant. If you drink alcohol: Limit how much you have to: 0-1 drink a day. Know how much alcohol is in your drink. In the [place] one drink equals one 12 oz bottle of beer (355 mL), one 5 oz glass of wine (148 mL), or one 1 oz glass of hard liquor (44 mL). Lifestyle Do not use any products that contain nicotine or tobacco. These products include cigarettes, chewing tobacco, and vaping devices, such as e-cigarettes. If you need help quitting, ask your health care provider. Do not use street drugs. Do not share needles. Ask your health care provider for help if you need support or information about quitting drugs. General instructions Schedule regular health, dental, and eye exams. Stay current with your vaccines. Tell your health care provider if: You often feel depressed. You have ever been abused or do not feel safe at home. Summary Adopting a healthy lifestyle and getting preventive care are important in promoting health and wellness. Follow your  health care provider's instructions about healthy diet, exercising, and getting tested or screened for diseases. Follow your health care provider's instructions on monitoring your cholesterol and blood pressure. This information is not intended to replace advice given to you by your health care provider. Make sure you discuss any questions you have with your health care provider. Document Revised: 05/23/2020 Document Reviewed: 05/23/2020 Elsevier Patient Education  [place].

## 2021-12-01 NOTE — Progress Notes (Signed)
Subjective:   Sandra Shelton is a 66 y.o. female who presents for Medicare Annual (Subsequent) preventive examination.I connected with  Demetra Shiner on 12/01/21 by a audio enabled telemedicine application and verified that I am speaking with the correct person using two identifiers.  Patient Location: Home  Provider Location: Home Office  I discussed the limitations of evaluation and management by telemedicine. The patient expressed understanding and agreed to proceed. Nutrition Risk Assessment:  Has the patient had any N/V/D within the last 2 months?  No  Does the patient have any non-healing wounds?  No  Has the patient had any unintentional weight loss or weight gain?  No   Diabetes:  Is the patient diabetic?  Yes  If diabetic, was a CBG obtained today?  Yes Did the patient bring in their glucometer from home?  No  How often do you monitor your CBG's? Everyday and before meals.   Financial Strains and Diabetes Management:  Are you having any financial strains with the device, your supplies or your medication? No .  Does the patient want to be seen by Chronic Care Management for management of their diabetes?  No  Would the patient like to be referred to a Nutritionist or for Diabetic Management?  No   Diabetic Exams:  Diabetic Eye Exam: Overdue for diabetic eye exam. Pt has been advised about the importance in completing this exam. Patient advised to call and schedule an eye exam.Patient has an eye exam scheduled at [facility] at the end of the month.  Diabetic Foot Exam: Completed 11/20/2021 .  Review of Systems    n/a       Objective:    There were no vitals filed for this visit. There is no height or weight on file to calculate BMI.     11/15/2021   10:11 AM 11/24/2019   10:31 AM 05/19/2019    8:08 AM 01/06/2019    9:25 AM 09/23/2018   12:31 PM 09/02/2018   11:46 AM 07/21/2018    8:47 AM  Advanced Directives  Does Patient Have a Medical Advance Directive? No No No No  No No No  Would patient like information on creating a medical advance directive? Yes (ED - Information included in AVS) Yes (Inpatient - patient defers creating a medical advance directive at this time - Information given) No - Patient declined Yes (ED - Information included in AVS)       Current Medications (verified) Outpatient Encounter Medications as of 12/01/2021  Medication Sig   amLODipine (NORVASC) 10 MG tablet TAKE 1 TABLET (10 MG TOTAL) BY MOUTH DAILY.   anastrozole (ARIMIDEX) 1 MG tablet TAKE 1 TABLET BY MOUTH ONCE DAILY   aspirin 81 MG tablet Take 1 tablet (81 mg total) by mouth daily.   blood glucose meter kit and supplies KIT Dispense based on patient and insurance preference. Use up to four times daily as directed. (FOR ICD-9 250.00, 250.01).   CALCIUM PO Take 15 mLs by mouth daily.   clopidogrel (PLAVIX) 75 MG tablet TAKE 1 TABLET (75 MG TOTAL) BY MOUTH DAILY.   clopidogrel (PLAVIX) 75 MG tablet Take 1 tablet (75 mg total) by mouth daily.   Dulaglutide (TRULICITY) 1.5 AU/6.3FH SOPN Inject 1.5 mg into the skin once a week.   Elderberry 575 MG/5ML SYRP Take 15 mLs by mouth daily as needed.    Insulin Pen Needle (TRUEPLUS PEN NEEDLES) 32G X 4 MM MISC Use as directed to inject insulin   lisinopril (ZESTRIL)  10 MG tablet Take 1 tablet (10 mg total) by mouth daily.   magnesium 30 MG tablet Take 1 tablet (30 mg total) by mouth daily.   metFORMIN (GLUCOPHAGE) 1000 MG tablet Take 1 tablet (1,000 mg total) by mouth 2 (two) times daily with a meal.   rosuvastatin (CRESTOR) 40 MG tablet Take 1 tablet (40 mg total) by mouth daily.   No facility-administered encounter medications on file as of 12/01/2021.    Allergies (verified) Patient has no known allergies.   History: Past Medical History:  Diagnosis Date   Breast cancer ([place])     IDC - right breast    Diabetes mellitus without complication ([place])    type 2   Embolic stroke involving carotid artery (HCC)    GERD  (gastroesophageal reflux disease)    Heart murmur    no problems with it   Hepatitis    in the 80's, took meds at the time    HLD (hyperlipidemia)    Hypertension    Peripheral vascular disease (HCC)    carotid artery blockage    Personal history of chemotherapy    completed 03/2019   Personal history of radiation therapy    completed   Sleep apnea    Does not use CPAP   Stroke ([place]) 11/20/2017   Vision abnormalities    glasses   Past Surgical History:  Procedure Laterality Date   BREAST EXCISIONAL BIOPSY Right    BREAST LUMPECTOMY Right 03/2018   BREAST LUMPECTOMY WITH RADIOACTIVE SEED AND SENTINEL LYMPH NODE BIOPSY Right 03/24/2018   Procedure: RIGHT BREAST LUMPECTOMY WITH RADIOACTIVE SEED AND RIGHT AXILLARY DEEP SENTINEL LYMPH NODE BIOPSY INJECT BLUE DYE RIGHT BREAST;  Surgeon: Fanny Skates, MD;  Location: [facility];  Service: General;  Laterality: Right;   COLONOSCOPY     PORT-A-CATH REMOVAL N/A 05/19/2019   Procedure: PORT REMOVAL;  Surgeon: Donnie Mesa, MD;  Location: [facility] OR;  Service: General;  Laterality: N/A;   PORTACATH PLACEMENT N/A 04/09/2018   Procedure: INSERTION PORT-A-CATH WITH ULTRASOUND;  Surgeon: Fanny Skates, MD;  Location: [facility];  Service: General;  Laterality: N/A;   TRANSCAROTID ARTERY REVASCULARIZATION  Left 07/24/2017   Procedure: TRANSCAROTID ARTERY REVASCULARIZATION, left;  Surgeon: Elam Dutch, MD;  Location: [facility];  Service: Vascular;  Laterality: Left;   UTERINE FIBROID SURGERY     2005   Family History  Problem Relation Age of Onset   Diabetes Mellitus II Sister    Stroke Mother    Diabetes Mellitus II Mother    Prostate cancer Father    Diabetes Mellitus II Brother    Breast cancer Maternal Aunt    Diabetes Maternal Aunt    Colon cancer Neg Hx    Esophageal cancer Neg Hx    Stomach cancer Neg Hx    Pancreatic cancer Neg Hx    Liver disease Neg Hx    Social History   Socioeconomic History    Marital status: Single    Spouse name: Not on file   Number of children: Not on file   Years of education: Not on file   Highest education level: Not on file  Occupational History   Not on file  Tobacco Use   Smoking status: Never   Smokeless tobacco: Never  Vaping Use   Vaping Use: Never used  Substance and Sexual Activity   Alcohol use: No   Drug use: No   Sexual activity: Not on file  Other Topics  Concern   Not on file  Social History Narrative   Lives home alone.  Single.  Education Some college.  No children.     Social Determinants of Health   Financial Resource Strain: Not on file  Food Insecurity: Not on file  Transportation Needs: Not on file  Physical Activity: Not on file  Stress: Not on file  Social Connections: Not on file    Tobacco Counseling Counseling given: Not Answered   Clinical Intake:               Diabetic         Activities of Daily Living     No data to display           Patient Care Team: [name], MD as PCP - General (Internal Medicine) Fanny Skates, MD as Consulting Physician (General Surgery) Nicholas Lose, MD as Consulting Physician (Hematology and Oncology) Gery Pray, MD as Consulting Physician (Radiation Oncology) Rockwell Germany, RN as Oncology Nurse Navigator Mauro Kaufmann, RN as Oncology Nurse Navigator  Indicate any recent Medical Services you may have received from other than [facility] providers in the past year (date may be approximate).     Assessment:   This is a routine wellness examination for Sandra Shelton.  Hearing/Vision screen No results found.  Dietary issues and exercise activities discussed:     Goals Addressed   None   Depression Screen    11/14/2021   10:47 AM 06/23/2021    9:29 AM 03/23/2021    8:48 AM 11/22/2020    9:14 AM 07/22/2020   10:02 AM 07/22/2020    9:57 AM 11/24/2019   10:29 AM  PHQ 2/9 Scores  PHQ - 2 Score 0 0 0 0 0 0 0  PHQ- 9 Score     0 0     Fall  Risk    11/14/2021   10:47 AM 06/23/2021    9:29 AM 03/23/2021    8:48 AM 11/22/2020    9:14 AM 07/22/2020    9:57 AM  Fall Risk   Falls in the past year? 0 0 0 0 0  Number falls in past yr: 0 0 0 0 0  Injury with Fall? 0 0 0 0 0  Risk for fall due to : No Fall Risks No Fall Risks  No Fall Risks History of fall(s)  Follow up   Falls evaluation completed  Falls evaluation completed    FALL RISK PREVENTION PERTAINING TO THE HOME:  Any stairs in or around the home? No  If so, are there any without handrails? No  Home free of loose throw rugs in walkways, pet beds, electrical cords, etc? Yes  Adequate lighting in your home to reduce risk of falls? Yes   ASSISTIVE DEVICES UTILIZED TO PREVENT FALLS:  Life alert? No  Use of a cane, walker or w/c? No  Grab bars in the bathroom? Yes Shower chair or bench in shower? No  Elevated toilet seat or a handicapped toilet? No    unable to assess gait. Visit done over the phone  Cognitive Function:    11/24/2019   10:32 AM  MMSE - Mini Mental State Exam  Orientation to time 5  Orientation to Place 5  Registration 3  Attention/ Calculation 5  Recall 3  Language- name 2 objects 2  Language- repeat 1  Language- follow 3 step command 3  Language- read & follow direction 1  Write a sentence 1  Copy design  1  Total score 30        Immunizations Immunization History  Administered Date(s) Administered   Fluad Quad(high Dose 65+) 11/25/2018   Influenza,inj,Quad PF,6+ Mos 10/30/2019, 11/22/2020, 11/14/2021   Moderna SARS-COV2 Booster Vaccination 05/06/2020   Moderna Sars-Covid-2 Vaccination 03/25/2019, 04/22/2019   Pneumococcal Conjugate-13 10/30/2019   Pneumococcal Polysaccharide-23 09/02/2017   Zoster Recombinat (Shingrix) 12/07/2020, 02/06/2021    TDAP status: Up to date  Flu Vaccine status: Up to date  Pneumococcal vaccine status: Up to date  Covid-19 vaccine status: Completed vaccines  Qualifies for Shingles Vaccine? Yes    Zostavax completed No   Shingrix Completed?: Yes  Screening Tests Health Maintenance  Topic Date Due   COVID-19 Vaccine (3 - Moderna risk series) 06/03/2020   OPHTHALMOLOGY EXAM  11/24/2021   Diabetic kidney evaluation - GFR measurement  03/24/2022   HEMOGLOBIN A1C  05/15/2022   Pneumonia Vaccine 28+ Years old (3 - PPSV23 or PCV20) 09/03/2022   Diabetic kidney evaluation - Urine ACR  11/15/2022   FOOT EXAM  11/21/2022   Medicare Annual Wellness (AWV)  12/02/2022   MAMMOGRAM  04/06/2023   TETANUS/TDAP  09/03/2027   COLONOSCOPY (Pts 45-15yr Insurance coverage will need to be confirmed)  07/20/2029   INFLUENZA VACCINE  Completed   DEXA SCAN  Completed   Hepatitis C Screening  Completed   Zoster Vaccines- Shingrix  Completed   HPV VACCINES  Aged Out    Health Maintenance  Health Maintenance Due  Topic Date Due   COVID-19 Vaccine (3 - Moderna risk series) 06/03/2020   OPHTHALMOLOGY EXAM  11/24/2021    Colorectal cancer screening: Type of screening: Colonoscopy. Completed July 21, 2019. Repeat every 10 years  Mammogram status: Completed March 22,2023 Next due in 2025. Repeat every year  Bone Density status: Completed May 22, 2021. Results reflect: Bone density results: OSTEOPOROSIS. Repeat every 2 years.  Lung Cancer Screening: (Low Dose CT Chest recommended if Age 68-80years, 30 pack-year currently smoking OR have quit w/in 15years.) does not qualify.   Lung Cancer Screening Referral: not needed. Does not qualify. No smoking history.   Additional Screening:  Hepatitis C Screening: does not qualify; Completed March 23, 2021  Vision Screening: Recommended annual ophthalmology exams for early detection of glaucoma and other disorders of the eye. Is the patient up to date with their annual eye exam?  Yes  Who is the provider or what is the name of the office in which the patient attends annual eye exams? [facility] If pt is not established with a provider, would they like to  be referred to a provider to establish care?  N/a .   Dental Screening: Recommended annual dental exams for proper oral hygiene  Community Resource Referral / Chronic Care Management: CRR required this visit?  No   CCM required this visit?  No      Plan:     I have personally reviewed and noted the following in the patient's chart:   Medical and social history Use of alcohol, tobacco or illicit drugs  Current medications and supplements including opioid prescriptions. Patient is not currently taking opioid prescriptions. Functional ability and status Nutritional status Physical activity Advanced directives List of other physicians Hospitalizations, surgeries, and ER visits in previous 12 months Vitals Screenings to include cognitive, depression, and falls Referrals and appointments  In addition, I have reviewed and discussed with patient certain preventive protocols, quality metrics, and best practice recommendations. A written personalized care plan for preventive  services as well as general preventive health recommendations were provided to patient.     Carilyn Goodpasture, RN   12/01/2021   Nurse Notes:  Non-Face-to-Face 27 minutes   [name] , Thank you for taking time to come for your Medicare Wellness Visit. I appreciate your ongoing commitment to your health goals. Please review the following plan we discussed and let me know if I can assist you in the future.   These are the goals we discussed:  Goals      Weight (lb) < 200 lb (90.7 kg)     I want to get healthy through losing 30-40 lbs        This is a list of the screening recommended for you and due dates:  Health Maintenance  Topic Date Due   COVID-19 Vaccine (3 - Moderna risk series) 06/03/2020   Eye exam for diabetics  11/24/2021   Yearly kidney function blood test for diabetes  03/24/2022   Hemoglobin A1C  05/15/2022   Pneumonia Vaccine (3 - PPSV23 or PCV20) 09/03/2022   Yearly kidney health  urinalysis for diabetes  11/15/2022   Complete foot exam   11/21/2022   Medicare Annual Wellness Visit  12/02/2022   Mammogram  04/06/2023   Tetanus Vaccine  09/03/2027   Colon Cancer Screening  07/20/2029   Flu Shot  Completed   DEXA scan (bone density measurement)  Completed   Hepatitis C Screening: USPSTF Recommendation to screen - Ages 89-79 yo.  Completed   Zoster (Shingles) Vaccine  Completed   HPV Vaccine  Aged Out

## 2021-12-11 ENCOUNTER — Other Ambulatory Visit: Payer: Self-pay

## 2021-12-14 ENCOUNTER — Other Ambulatory Visit: Payer: Self-pay

## 2021-12-17 ENCOUNTER — Other Ambulatory Visit: Payer: Self-pay | Admitting: Internal Medicine

## 2021-12-17 DIAGNOSIS — E1159 Type 2 diabetes mellitus with other circulatory complications: Secondary | ICD-10-CM

## 2021-12-18 NOTE — Telephone Encounter (Signed)
Rx was sent to [facility] on 11/14/21 #90/2.   Requested Prescriptions  Pending Prescriptions Disp Refills   amLODipine (NORVASC) 10 MG tablet [Pharmacy Med Name: AMLODIPINE BESYLATE '10MG'$  TABLETS] 90 tablet 2    Sig: TAKE 1 TABLET BY MOUTH EVERY DAY     Cardiovascular: Calcium Channel Blockers 2 Failed - 12/17/2021  6:32 AM      Failed - Last BP in normal range    BP Readings from Last 1 Encounters:  11/15/21 (!) 173/75         Passed - Last Heart Rate in normal range    Pulse Readings from Last 1 Encounters:  11/15/21 96         Passed - Valid encounter within last 6 months    Recent Outpatient Visits           1 month ago Type 2 diabetes mellitus with other circulatory complications ([place])   [place], MD   5 months ago Type 2 diabetes mellitus with obesity [facility])   [place] [name], MD   6 months ago Essential hypertension   [place], Stephen L, RPH-CPP   7 months ago Essential hypertension   [place], Stephen L, RPH-CPP   8 months ago Essential hypertension   [place], RPH-CPP       Future Appointments             In 2 months Wynetta Emery, Dalbert Batman, MD [place]

## 2021-12-21 ENCOUNTER — Other Ambulatory Visit: Payer: Self-pay

## 2022-01-11 ENCOUNTER — Other Ambulatory Visit: Payer: Self-pay

## 2022-01-11 ENCOUNTER — Other Ambulatory Visit: Payer: Self-pay | Admitting: Internal Medicine

## 2022-01-11 MED ORDER — CLOPIDOGREL BISULFATE 75 MG PO TABS
75.0000 mg | ORAL_TABLET | Freq: Every day | ORAL | 1 refills | Status: DC
  Filled 2022-01-11: qty 30, 30d supply, fill #0
  Filled 2022-02-09 (×2): qty 30, 30d supply, fill #1

## 2022-01-12 ENCOUNTER — Other Ambulatory Visit: Payer: Self-pay

## 2022-02-07 ENCOUNTER — Encounter: Payer: Self-pay | Admitting: Hematology and Oncology

## 2022-02-09 ENCOUNTER — Other Ambulatory Visit: Payer: Self-pay

## 2022-02-12 ENCOUNTER — Other Ambulatory Visit: Payer: Self-pay

## 2022-02-12 ENCOUNTER — Other Ambulatory Visit: Payer: Self-pay | Admitting: *Deleted

## 2022-02-12 DIAGNOSIS — Z95828 Presence of other vascular implants and grafts: Secondary | ICD-10-CM

## 2022-02-13 ENCOUNTER — Other Ambulatory Visit: Payer: Self-pay

## 2022-02-15 ENCOUNTER — Other Ambulatory Visit: Payer: Self-pay

## 2022-02-21 NOTE — Progress Notes (Unsigned)
HISTORY AND PHYSICAL     CC:  follow up. Requesting Provider:  [name], MD  HPI: This is a 67 y.o. female here for follow up for carotid artery stenosis.  Pt is s/p left TCAR for symptomatic carotid artery stenosis on 07/24/2017 by Dr. Oneida Alar.    Pt was last seen 10/28/2020 and at that time she was doing well without any neurological sx.  She was compliant with her asa/plavix/statin.    Pt returns today for follow up.    Pt denies any amaurosis fugax, speech difficulties, weakness, numbness, paralysis or clumsiness or facial droop.  She denies any claudication, rest pain. She is compliant with here asa/statin.  She is a breast cancer survivior.   The pt is on a statin for cholesterol management.  The pt is on a daily aspirin.   Other AC:  none The pt is on ACEI, CCB for hypertension.   The pt does  have diabetes Tobacco hx:  never    Past Medical History:  Diagnosis Date   Breast cancer ([place])     IDC - right breast    Diabetes mellitus without complication ([place])    type 2   Embolic stroke involving carotid artery (HCC)    GERD (gastroesophageal reflux disease)    Heart murmur    no problems with it   Hepatitis    in the 80's, took meds at the time    HLD (hyperlipidemia)    Hypertension    Peripheral vascular disease ([place])    carotid artery blockage    Personal history of chemotherapy    completed 03/2019   Personal history of radiation therapy    completed   Sleep apnea    Does not use CPAP   Stroke ([place]) 11/20/2017   Vision abnormalities    glasses    Past Surgical History:  Procedure Laterality Date   BREAST EXCISIONAL BIOPSY Right    BREAST LUMPECTOMY Right 03/2018   BREAST LUMPECTOMY WITH RADIOACTIVE SEED AND SENTINEL LYMPH NODE BIOPSY Right 03/24/2018   Procedure: RIGHT BREAST LUMPECTOMY WITH RADIOACTIVE SEED AND RIGHT AXILLARY DEEP SENTINEL LYMPH NODE BIOPSY INJECT BLUE DYE RIGHT BREAST;  Surgeon: Fanny Skates, MD;  Location: [facility];  Service: General;  Laterality: Right;   COLONOSCOPY     PORT-A-CATH REMOVAL N/A 05/19/2019   Procedure: PORT REMOVAL;  Surgeon: Donnie Mesa, MD;  Location: [facility];  Service: General;  Laterality: N/A;   PORTACATH PLACEMENT N/A 04/09/2018   Procedure: INSERTION PORT-A-CATH WITH ULTRASOUND;  Surgeon: Fanny Skates, MD;  Location: [facility];  Service: General;  Laterality: N/A;   TRANSCAROTID ARTERY REVASCULARIZATION  Left 07/24/2017   Procedure: TRANSCAROTID ARTERY REVASCULARIZATION, left;  Surgeon: Elam Dutch, MD;  Location: [facility];  Service: Vascular;  Laterality: Left;   UTERINE FIBROID SURGERY     2005    No Known Allergies  Current Outpatient Medications  Medication Sig Dispense Refill   amLODipine (NORVASC) 10 MG tablet TAKE 1 TABLET (10 MG TOTAL) BY MOUTH DAILY. 90 tablet 2   anastrozole (ARIMIDEX) 1 MG tablet TAKE 1 TABLET BY MOUTH ONCE DAILY 90 tablet 3   aspirin 81 MG tablet Take 1 tablet (81 mg total) by mouth daily. 100 tablet 2   blood glucose meter kit and supplies KIT Dispense based on patient and insurance preference. Use up to four times daily as directed. (FOR ICD-9 250.00, 250.01). 1 each 0   CALCIUM PO Take 15 mLs  by mouth daily.     clopidogrel (PLAVIX) 75 MG tablet Take 1 tablet (75 mg total) by mouth daily. 30 tablet 1   Dulaglutide (TRULICITY) 1.5 JI/9.6VE SOPN Inject 1.5 mg into the skin once a week. 6 mL 2   Elderberry 575 MG/5ML SYRP Take 15 mLs by mouth daily as needed.      Insulin Pen Needle (TRUEPLUS PEN NEEDLES) 32G X 4 MM MISC Use as directed to inject insulin 100 each 3   lisinopril (ZESTRIL) 10 MG tablet Take 1 tablet (10 mg total) by mouth daily. 90 tablet 2   magnesium 30 MG tablet Take 1 tablet (30 mg total) by mouth daily.     metFORMIN (GLUCOPHAGE) 1000 MG tablet Take 1 tablet (1,000 mg total) by mouth 2 (two) times daily with a meal. 180 tablet 2   rosuvastatin (CRESTOR) 40 MG tablet Take 1 tablet (40 mg total)  by mouth daily. 90 tablet 2   No current facility-administered medications for this visit.    Family History  Problem Relation Age of Onset   Diabetes Mellitus II Sister    Stroke Mother    Diabetes Mellitus II Mother    Prostate cancer Father    Diabetes Mellitus II Brother    Breast cancer Maternal Aunt    Diabetes Maternal Aunt    Colon cancer Neg Hx    Esophageal cancer Neg Hx    Stomach cancer Neg Hx    Pancreatic cancer Neg Hx    Liver disease Neg Hx     Social History   Socioeconomic History   Marital status: Single    Spouse name: Not on file   Number of children: Not on file   Years of education: Not on file   Highest education level: Not on file  Occupational History   Not on file  Tobacco Use   Smoking status: Never   Smokeless tobacco: Never  Vaping Use   Vaping Use: Never used  Substance and Sexual Activity   Alcohol use: No   Drug use: No   Sexual activity: Not on file  Other Topics Concern   Not on file  Social History Narrative   Lives home alone.  Single.  Education Some college.  No children.     Social Determinants of Health   Financial Resource Strain: Low Risk  (12/01/2021)   Overall Financial Resource Strain (CARDIA)    Difficulty of Paying Living Expenses: Not hard at all  Food Insecurity: No Food Insecurity (12/01/2021)   Hunger Vital Sign    Worried About Running Out of Food in the Last Year: Never true    Ran Out of Food in the Last Year: Never true  Transportation Needs: No Transportation Needs (12/01/2021)   PRAPARE - Hydrologist (Medical): No    Lack of Transportation (Non-Medical): No  Physical Activity: Insufficiently Active (12/01/2021)   Exercise Vital Sign    Days of Exercise per Week: 3 days    Minutes of Exercise per Session: 30 min  Stress: No Stress Concern Present (12/01/2021)   [place]    Feeling of Stress : Not at  all  Social Connections: Socially Isolated (12/01/2021)   Social Connection and Isolation Panel [NHANES]    Frequency of Communication with Friends and Family: More than three times a week    Frequency of Social Gatherings with Friends and Family: More than three times a week  Attends Religious Services: Never    Active Member of Clubs or Organizations: Not on file    Attends Club or Organization Meetings: Never    Marital Status: Never married  Intimate Partner Violence: Not At Risk (12/01/2021)   Humiliation, Afraid, Rape, and Kick questionnaire    Fear of Current or Ex-Partner: No    Emotionally Abused: No    Physically Abused: No    Sexually Abused: No     REVIEW OF SYSTEMS:   '[X]'$  denotes positive finding, '[ ]'$  denotes negative finding Cardiac  Comments:  Chest pain or chest pressure:    Shortness of breath upon exertion:    Short of breath when lying flat:    Irregular heart rhythm:        Vascular    Pain in calf, thigh, or hip brought on by ambulation:    Pain in feet at night that wakes you up from your sleep:     Blood clot in your veins:    Leg swelling:         Pulmonary    Oxygen at home:    Productive cough:     Wheezing:         Neurologic    Sudden weakness in arms or legs:     Sudden numbness in arms or legs:     Sudden onset of difficulty speaking or slurred speech:    Temporary loss of vision in one eye:     Problems with dizziness:         Gastrointestinal    Blood in stool:     Vomited blood:         Genitourinary    Burning when urinating:     Blood in urine:        Psychiatric    Major depression:         Hematologic    Bleeding problems:    Problems with blood clotting too easily:        Skin    Rashes or ulcers:        Constitutional    Fever or chills:      PHYSICAL EXAMINATION:  Today's Vitals   02/22/22 1032 02/22/22 1034  BP: (!) 151/76 (!) 163/81  Pulse: 78   Temp: (!) 97.5 F (36.4 C)   TempSrc: Temporal    SpO2: 100%   Weight: 181 lb (82.1 kg)   Height: '5\' 5"'$  (1.651 m)    Body mass index is 30.12 kg/m.   General:  WDWN in NAD; vital signs documented above Gait: Not observed HENT: WNL, normocephalic Pulmonary: normal non-labored breathing Cardiac: regular HR, without carotid bruits Skin: without rashes Vascular Exam/Pulses: She has easily palpable bilateral radial pulses.  Extremities: without open wounds Musculoskeletal: no muscle wasting or atrophy  Neurologic: A&O X 3; moving all extremities equally; speech is fluent/normal Psychiatric:  The pt has Normal affect.   Non-Invasive Vascular Imaging:   Carotid Duplex on 02/22/2022 Right:  1-39% ICA stenosis Left:  patent stent without stenosis   Previous Carotid duplex on 10/28/2020: Right: 1-39% ICA stenosis Left:   patent stent without stenosis    ASSESSMENT/PLAN:: 67 y.o. female here for follow up carotid artery stenosis and s/p left TCAR for symptomatic carotid artery stenosis on 07/24/2017 by Dr. Oneida Alar.    -duplex today reveals the left carotid stent remains patent.  The right remains in 1-39% stenosis.  She is asymptomatic -discussed s/s of stroke with pt and she understands should she  develop any of these sx, she will go to the nearest ER or call 911. -pt will f/u in one year with carotid duplex -pt will call sooner should she have any issues. -continue statin/asa   Leontine Locket, [facility] [phone]  Clinic MD:  Donzetta Matters

## 2022-02-22 ENCOUNTER — Ambulatory Visit (INDEPENDENT_AMBULATORY_CARE_PROVIDER_SITE_OTHER): Payer: 59 | Admitting: Physician Assistant

## 2022-02-22 ENCOUNTER — Ambulatory Visit (HOSPITAL_COMMUNITY)
Admission: RE | Admit: 2022-02-22 | Discharge: 2022-02-22 | Disposition: A | Discharge status: Home / Self Care | Payer: 59 | Source: Ambulatory Visit | Attending: Vascular Surgery | Admitting: Vascular Surgery

## 2022-02-22 VITALS — BP 163/81 | HR 78 | Temp 97.5°F | Ht 65.0 in | Wt 181.0 lb

## 2022-02-22 DIAGNOSIS — I6523 Occlusion and stenosis of bilateral carotid arteries: Secondary | ICD-10-CM | POA: Diagnosis not present

## 2022-02-22 DIAGNOSIS — Z95828 Presence of other vascular implants and grafts: Secondary | ICD-10-CM | POA: Diagnosis not present

## 2022-02-26 ENCOUNTER — Other Ambulatory Visit: Payer: Self-pay

## 2022-03-13 ENCOUNTER — Other Ambulatory Visit: Payer: Self-pay | Admitting: Internal Medicine

## 2022-03-13 DIAGNOSIS — Z1231 Encounter for screening mammogram for malignant neoplasm of breast: Secondary | ICD-10-CM

## 2022-03-15 ENCOUNTER — Ambulatory Visit: Payer: 59 | Attending: Internal Medicine | Admitting: Internal Medicine

## 2022-03-15 ENCOUNTER — Encounter: Payer: Self-pay | Admitting: Internal Medicine

## 2022-03-15 ENCOUNTER — Other Ambulatory Visit: Payer: Self-pay

## 2022-03-15 ENCOUNTER — Other Ambulatory Visit: Payer: Self-pay | Admitting: Internal Medicine

## 2022-03-15 VITALS — BP 155/75 | HR 85 | Temp 98.4°F | Ht 65.0 in | Wt 184.0 lb

## 2022-03-15 DIAGNOSIS — I152 Hypertension secondary to endocrine disorders: Secondary | ICD-10-CM

## 2022-03-15 DIAGNOSIS — I6523 Occlusion and stenosis of bilateral carotid arteries: Secondary | ICD-10-CM

## 2022-03-15 DIAGNOSIS — C50411 Malignant neoplasm of upper-outer quadrant of right female breast: Secondary | ICD-10-CM | POA: Diagnosis not present

## 2022-03-15 DIAGNOSIS — E785 Hyperlipidemia, unspecified: Secondary | ICD-10-CM | POA: Diagnosis not present

## 2022-03-15 DIAGNOSIS — E1159 Type 2 diabetes mellitus with other circulatory complications: Secondary | ICD-10-CM

## 2022-03-15 DIAGNOSIS — Z23 Encounter for immunization: Secondary | ICD-10-CM | POA: Diagnosis not present

## 2022-03-15 DIAGNOSIS — E1169 Type 2 diabetes mellitus with other specified complication: Secondary | ICD-10-CM

## 2022-03-15 DIAGNOSIS — Z170 Estrogen receptor positive status [ER+]: Secondary | ICD-10-CM | POA: Diagnosis not present

## 2022-03-15 LAB — POCT GLYCOSYLATED HEMOGLOBIN (HGB A1C): HbA1c, POC (controlled diabetic range): 6.9 % (ref 0.0–7.0)

## 2022-03-15 LAB — GLUCOSE, POCT (MANUAL RESULT ENTRY): POC Glucose: 108 mg/dl — AB (ref 70–99)

## 2022-03-15 MED ORDER — CLOPIDOGREL BISULFATE 75 MG PO TABS
75.0000 mg | ORAL_TABLET | Freq: Every day | ORAL | 1 refills | Status: DC
  Filled 2022-03-15: qty 90, 90d supply, fill #0
  Filled 2022-06-05: qty 90, 90d supply, fill #1

## 2022-03-15 MED ORDER — LISINOPRIL 10 MG PO TABS
15.0000 mg | ORAL_TABLET | Freq: Every day | ORAL | 1 refills | Status: DC
  Filled 2022-03-15: qty 135, 90d supply, fill #0
  Filled 2022-08-06: qty 135, 90d supply, fill #1

## 2022-03-15 NOTE — Patient Instructions (Signed)
Blood pressure goal is 130/80 or lower.  You are not at goal.  We have increased lisinopril to 10 mg for you to take 1-1/2 a tablet daily.    Try to get in some exercise at least 3 to 5 days a week for 30 minutes.

## 2022-03-15 NOTE — Progress Notes (Signed)
Patient ID: Sandra Shelton, female    DOB: 1955-07-01  MRN: [id number]  CC: Diabetes (DM f/u. Med refills./Already received flu vax)   Subjective: Sandra Shelton is a 67 y.o. 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 Checks BS 2-3x/day.  A.m range 96-120, before dinner 120-130 Not exercise much due to cold weather Does her own cooking - eat a lot of veggies and not much beef Weight is up 8 pounds since last visit with me. In regards to her blood pressure, she reports compliance with Norvasc 10 mg daily and lisinopril 10 mg daily.  Just got a new blood pressure monitoring device but has not started checking blood pressure as yet.  Limits salt in foods.  No CP/SOB/LE edema Taking and tolerating Crestor 40 mg daily. Saw Vascular recently on 02/22/2022.  Had carotid Dopplers.  Carotid artery patent on the left side, 1 to 39% stenosis on the right side.  No bruising/bleeding on ASA/Plavix  Hx of breast CA: Saw Dr. Lindi Adie in follow-up 11/15/2021.  She continues on antiestrogen therapy anastrozole.  Last DEXA scan 05/2021 revealing osteopenia.  On calcium and vitamin D OTC.  Last mammogram 03/2021, next scheduled for April. She also is being followed for MGUS which is stable.  HM:  Plans to get update COVID booster.  Due for Tdapt.    Patient Active Problem List   Diagnosis Date Noted   Osteopenia after menopause 06/23/2021   MGUS (monoclonal gammopathy of unknown significance) 04/17/2021   Advance directive discussed with patient 11/24/2019   Obesity (BMI 30-39.9) 04/21/2019   Port-A-Cath in place 05/06/2018   Malignant neoplasm of upper-outer quadrant of right breast in female, estrogen receptor positive ([place]) 02/10/2018   Hypercalcemia 12/09/2017   Diabetes mellitus type II, controlled ([place]) 09/02/2017   Essential hypertension 09/02/2017   Pre-ulcerative corn or callous 0000000   Embolic stroke involving left carotid artery ([place]) 07/24/2017   Cerebral thrombosis with cerebral infarction 07/08/2017     Current Outpatient Medications on File Prior to Visit  Medication Sig Dispense Refill   amLODipine (NORVASC) 10 MG tablet TAKE 1 TABLET (10 MG TOTAL) BY MOUTH DAILY. 90 tablet 2   anastrozole (ARIMIDEX) 1 MG tablet TAKE 1 TABLET BY MOUTH ONCE DAILY 90 tablet 3   aspirin 81 MG tablet Take 1 tablet (81 mg total) by mouth daily. 100 tablet 2   blood glucose meter kit and supplies KIT Dispense based on patient and insurance preference. Use up to four times daily as directed. (FOR ICD-9 250.00, 250.01). 1 each 0   CALCIUM PO Take 15 mLs by mouth daily.     Dulaglutide (TRULICITY) 1.5 0000000 SOPN Inject 1.5 mg into the skin once a week. 6 mL 2  Elderberry 575 MG/5ML SYRP Take 15 mLs by mouth daily as needed.      magnesium 30 MG tablet Take 1 tablet (30 mg total) by mouth daily.     metFORMIN (GLUCOPHAGE) 1000 MG tablet Take 1 tablet (1,000 mg total) by mouth 2 (two) times daily with a meal. 180 tablet 2   rosuvastatin (CRESTOR) 40 MG tablet Take 1 tablet (40 mg total) by mouth daily. 90 tablet 2   No current facility-administered medications on file prior to visit.    No Known Allergies  Social History   Socioeconomic History   Marital status: Single    Spouse name: Not on file   Number of children: Not  on file   Years of education: Not on file   Highest education level: Not on file  Occupational History   Not on file  Tobacco Use   Smoking status: Never   Smokeless tobacco: Never  Vaping Use   Vaping Use: Never used  Substance and Sexual Activity   Alcohol use: No   Drug use: No   Sexual activity: Not on file  Other Topics Concern   Not on file  Social History Narrative   Lives home alone.  Single.  Education Some college.  No children.     Social Determinants of Health   Financial Resource Strain: Low Risk  (12/01/2021)   Overall Financial Resource Strain (CARDIA)    Difficulty of Paying Living Expenses: Not hard at all  Food Insecurity: No Food Insecurity (12/01/2021)   Hunger Vital Sign    Worried About Running Out of Food in the Last Year: Never true    Ran Out of Food in the Last Year: Never true  Transportation Needs: No Transportation Needs (12/01/2021)   PRAPARE - Hydrologist (Medical): No    Lack of Transportation (Non-Medical): No  Physical Activity: Insufficiently Active (12/01/2021)   Exercise Vital Sign    Days of Exercise per Week: 3 days    Minutes of Exercise per Session: 30 min  Stress: No Stress Concern Present (12/01/2021)   [place]    Feeling of Stress : Not at all  Social Connections: Socially Isolated (12/01/2021)   Social Connection and Isolation Panel [NHANES]    Frequency of Communication with Friends and Family: More than three times a week    Frequency of Social Gatherings with Friends and Family: More than three times a week    Attends Religious Services: Never    Marine scientist or Organizations: Not on file    Attends Archivist Meetings: Never    Marital Status: Never married  Intimate Partner Violence: Not At Risk (12/01/2021)   Humiliation, Afraid, Rape, and Kick questionnaire    Fear of Current or Ex-Partner: No     Emotionally Abused: No    Physically Abused: No    Sexually Abused: No    Family History  Problem Relation Age of Onset   Diabetes Mellitus II Sister    Stroke Mother    Diabetes Mellitus II Mother    Prostate cancer Father    Diabetes Mellitus II Brother    Breast cancer Maternal Aunt    Diabetes Maternal Aunt    Colon cancer Neg Hx    Esophageal cancer Neg Hx    Stomach cancer Neg Hx    Pancreatic cancer Neg Hx    Liver disease Neg Hx  Past Surgical History:  Procedure Laterality Date   BREAST EXCISIONAL BIOPSY Right    BREAST LUMPECTOMY Right 03/2018   BREAST LUMPECTOMY WITH RADIOACTIVE SEED AND SENTINEL LYMPH NODE BIOPSY Right 03/24/2018   Procedure: RIGHT BREAST LUMPECTOMY WITH RADIOACTIVE SEED AND RIGHT AXILLARY DEEP SENTINEL LYMPH NODE BIOPSY INJECT BLUE DYE RIGHT BREAST;  Surgeon: Fanny Skates, MD;  Location: [place];  Service: General;  Laterality: Right;   COLONOSCOPY     PORT-A-CATH REMOVAL N/A 05/19/2019   Procedure: PORT REMOVAL;  Surgeon: Donnie Mesa, MD;  Location: [facility];  Service: General;  Laterality: N/A;   PORTACATH PLACEMENT N/A 04/09/2018   Procedure: INSERTION PORT-A-CATH WITH ULTRASOUND;  Surgeon: Fanny Skates, MD;  Location: [place];  Service: General;  Laterality: N/A;   TRANSCAROTID ARTERY REVASCULARIZATION  Left 07/24/2017   Procedure: TRANSCAROTID ARTERY REVASCULARIZATION, left;  Surgeon: Elam Dutch, MD;  Location: [place];  Service: Vascular;  Laterality: Left;   UTERINE FIBROID SURGERY     2005    ROS: Review of Systems Negative except as stated above  PHYSICAL EXAM: BP (!) 155/75 (BP Location: Left Arm, Patient Position: Sitting, Cuff Size: Normal)   Pulse 85   Temp 98.4 F (36.9 C) (Oral)   Ht '5\' 5"'$  (1.651 m)   Wt 184 lb (83.5 kg)   LMP  (LMP Unknown)   SpO2 100%   BMI 30.62 kg/m   Wt Readings from Last 3 Encounters:  03/15/22 184 lb (83.5 kg)  02/22/22 181 lb (82.1 kg)  11/15/21  176 lb 9.6 oz (80.1 kg)  Repeat BP 158/80  Physical Exam  General appearance - alert, well appearing, older AAF and in no distress Mental status - normal mood, behavior, speech, dress, motor activity, and thought processes Neck - supple, no significant adenopathy Chest - clear to auscultation, no wheezes, rales or rhonchi, symmetric air entry Heart - normal rate, regular rhythm, normal S1, S2, no murmurs, rubs, clicks or gallops Extremities - peripheral pulses normal, no pedal edema, no clubbing or cyanosis      Latest Ref Rng & Units 03/28/2021   10:31 AM 03/23/2021   10:16 AM 04/01/2020    8:36 AM  CMP  Glucose 70 - 99 mg/dL  93  142   BUN 8 - 27 mg/dL  13  13   Creatinine 0.57 - 1.00 mg/dL  0.72  0.75   Sodium 134 - 144 mmol/L  141  140   Potassium 3.5 - 5.2 mmol/L  4.2  4.2   Chloride 96 - 106 mmol/L  103  101   CO2 20 - 29 mmol/L  22  19   Calcium 8.7 - 10.3 mg/dL 10.7  11.1  10.4   Total Protein 6.0 - 8.5 g/dL 7.8  8.2  7.8   Total Bilirubin 0.0 - 1.2 mg/dL  <0.2  <0.2   Alkaline Phos 44 - 121 IU/L  66  66   AST 0 - 40 IU/L  19  15   ALT 0 - 32 IU/L  23  16    Lipid Panel     Component Value Date/Time   CHOL 147 03/23/2021 1016   TRIG 105 03/23/2021 1016   HDL 51 03/23/2021 1016   CHOLHDL 2.9 03/23/2021 1016   CHOLHDL 8.2 07/09/2017 0646   VLDL 76 (H) 07/09/2017 0646   LDLCALC 77 03/23/2021 1016    CBC    Component Value Date/Time   WBC 7.5 03/23/2021 1016  WBC 6.1 05/19/2019 0730   RBC 4.98 03/23/2021 1016   RBC 4.72 05/19/2019 0730   HGB 13.4 03/23/2021 1016   HCT 41.5 03/23/2021 1016   PLT 324 03/23/2021 1016   MCV 83 03/23/2021 1016   MCH 26.9 03/23/2021 1016   MCH 27.5 05/19/2019 0730   MCHC 32.3 03/23/2021 1016   MCHC 31.9 05/19/2019 0730   RDW 13.6 03/23/2021 1016   LYMPHSABS 1.5 03/10/2019 0845   MONOABS 0.4 03/10/2019 0845   EOSABS 0.1 03/10/2019 0845   BASOSABS 0.0 03/10/2019 0845    ASSESSMENT AND PLAN:  1. Type 2 diabetes mellitus  with other circulatory complications (HCC) Diabetes control. Continue metformin and Trulicity. Encouraged her to continue healthy eating habits Strongly encouraged her to get in some form of exercise 3 to 5 days a week for 30 minutes.  Went over exercises that she can do at home when it is too cold or hot to walk outside. - POCT glucose (manual entry) - POCT glycosylated hemoglobin (Hb A1C) - Ambulatory referral to Ophthalmology - Microalbumin / creatinine urine ratio - clopidogrel (PLAVIX) 75 MG tablet; Take 1 tablet (75 mg total) by mouth daily.  Dispense: 90 tablet; Refill: 1  2. Hypertension associated with type 2 diabetes mellitus (HCC) Not at goal.  Continue Norvasc 10 mg.  Increase lisinopril to 15 mg daily. - CBC - Comprehensive metabolic panel - lisinopril (ZESTRIL) 10 MG tablet; Take 1.5 tablets (15 mg total) by mouth daily.  Dispense: 135 tablet; Refill: 1  3. Hyperlipidemia associated with type 2 diabetes mellitus (HCC) Last LDL was 77 with goal being less than 70.  Continue Crestor.  Due for lipid profile - Lipid panel  4. Bilateral carotid artery stenosis - clopidogrel (PLAVIX) 75 MG tablet; Take 1 tablet (75 mg total) by mouth daily.  Dispense: 90 tablet; Refill: 1  5. Malignant neoplasm of upper-outer quadrant of right breast in female, estrogen receptor positive ([place]) Still on anastrozole  6. Need for Tdap vaccination Patient was agreeable to receiving Tdap today which was given.  Advised that it can cause some soreness and redness at the injection site.    Patient was given the opportunity to ask questions.  Patient verbalized understanding of the plan and was able to repeat key elements of the plan.   This documentation was completed using Radio producer.  Any transcriptional errors are unintentional.  Orders Placed This Encounter  Procedures   Tdap vaccine greater than or equal to 7yo IM   Microalbumin / creatinine urine ratio   CBC    Comprehensive metabolic panel   Lipid panel   Ambulatory referral to Ophthalmology   POCT glucose (manual entry)   POCT glycosylated hemoglobin (Hb A1C)     Requested Prescriptions   Signed Prescriptions Disp Refills   clopidogrel (PLAVIX) 75 MG tablet 90 tablet 1    Sig: Take 1 tablet (75 mg total) by mouth daily.   lisinopril (ZESTRIL) 10 MG tablet 135 tablet 1    Sig: Take 1.5 tablets (15 mg total) by mouth daily.    Return in about 4 months (around 07/14/2022).  [name], MD, FACP

## 2022-03-16 ENCOUNTER — Telehealth: Payer: Self-pay | Admitting: Internal Medicine

## 2022-03-16 ENCOUNTER — Other Ambulatory Visit: Payer: Self-pay | Admitting: *Deleted

## 2022-03-16 DIAGNOSIS — C50411 Malignant neoplasm of upper-outer quadrant of right female breast: Secondary | ICD-10-CM

## 2022-03-16 DIAGNOSIS — D472 Monoclonal gammopathy: Secondary | ICD-10-CM

## 2022-03-16 NOTE — Telephone Encounter (Signed)
Phone call placed to patient today to go over the results of her lab tests. Of most concern is her calcium level has increased from 10.7 when last checked in October to 11.3.  Patient with history of MGUS.  She had seen Dr. Lindi Adie this past November and has labs ordered for later this month for myeloma panel.  This was last checked around March of last year.  Advised patient to call his office and see whether they need to move up the date of her doing the lab.  I will also send the results of lab test to her hematologist/oncologist. Otherwise kidney function is good.  Liver function test normal.  Blood cell counts are normal.  Cholesterol level okay. If there has been no progression of the MGUS, we can do a 24-hour urine collection to screen for [place].

## 2022-03-17 LAB — COMPREHENSIVE METABOLIC PANEL
ALT: 25 IU/L (ref 0–32)
AST: 21 IU/L (ref 0–40)
Albumin/Globulin Ratio: 1.5 (ref 1.2–2.2)
Albumin: 5.2 g/dL — ABNORMAL HIGH (ref 3.9–4.9)
Alkaline Phosphatase: 73 IU/L (ref 44–121)
BUN/Creatinine Ratio: 15 (ref 12–28)
BUN: 13 mg/dL (ref 8–27)
Bilirubin Total: <0.2 mg/dL (ref 0.0–1.2)
CO2: 20 mmol/L (ref 20–29)
Calcium: 11.3 mg/dL — ABNORMAL HIGH (ref 8.7–10.3)
Chloride: 100 mmol/L (ref 96–106)
Creatinine, Ser: 0.86 mg/dL (ref 0.57–1.00)
Globulin, Total: 3.4 g/dL (ref 1.5–4.5)
Glucose: 102 mg/dL — ABNORMAL HIGH (ref 70–99)
Potassium: 4.2 mmol/L (ref 3.5–5.2)
Sodium: 139 mmol/L (ref 134–144)
Total Protein: 8.6 g/dL — ABNORMAL HIGH (ref 6.0–8.5)
eGFR: 73 mL/min/{1.73_m2} (ref >59)

## 2022-03-17 LAB — MICROALBUMIN / CREATININE URINE RATIO
Creatinine, Urine: 156.8 mg/dL
Microalb/Creat Ratio: 33 mg/g creat — ABNORMAL HIGH (ref 0–29)
Microalbumin, Urine: 51.7 ug/mL

## 2022-03-17 LAB — CBC
Hematocrit: 45.1 % (ref 34.0–46.6)
Hemoglobin: 14.8 g/dL (ref 11.1–15.9)
MCH: 27.4 pg (ref 26.6–33.0)
MCHC: 32.8 g/dL (ref 31.5–35.7)
MCV: 83 fL (ref 79–97)
Platelets: 362 10*3/uL (ref 150–450)
RBC: 5.41 x10E6/uL — ABNORMAL HIGH (ref 3.77–5.28)
RDW: 12.5 % (ref 11.7–15.4)
WBC: 7.1 10*3/uL (ref 3.4–10.8)

## 2022-03-17 LAB — LIPID PANEL
Chol/HDL Ratio: 2.9 ratio (ref 0.0–4.4)
Cholesterol, Total: 170 mg/dL (ref 100–199)
HDL: 59 mg/dL (ref >39)
LDL Chol Calc (NIH): 85 mg/dL (ref 0–99)
Triglycerides: 153 mg/dL — ABNORMAL HIGH (ref 0–149)
VLDL Cholesterol Cal: 26 mg/dL (ref 5–40)

## 2022-03-19 ENCOUNTER — Other Ambulatory Visit: Payer: Self-pay

## 2022-03-19 ENCOUNTER — Inpatient Hospital Stay: Payer: 59 | Attending: Hematology and Oncology

## 2022-03-19 DIAGNOSIS — D472 Monoclonal gammopathy: Secondary | ICD-10-CM | POA: Insufficient documentation

## 2022-03-19 DIAGNOSIS — Z170 Estrogen receptor positive status [ER+]: Secondary | ICD-10-CM | POA: Insufficient documentation

## 2022-03-19 DIAGNOSIS — C50411 Malignant neoplasm of upper-outer quadrant of right female breast: Secondary | ICD-10-CM | POA: Diagnosis not present

## 2022-03-19 DIAGNOSIS — Z79811 Long term (current) use of aromatase inhibitors: Secondary | ICD-10-CM | POA: Diagnosis not present

## 2022-03-19 LAB — CMP (CANCER CENTER ONLY)
ALT: 23 U/L (ref 0–44)
AST: 18 U/L (ref 15–41)
Albumin: 4.7 g/dL (ref 3.5–5.0)
Alkaline Phosphatase: 60 U/L (ref 38–126)
Anion gap: 9 (ref 5–15)
BUN: 13 mg/dL (ref 8–23)
CO2: 22 mmol/L (ref 22–32)
Calcium: 10.6 mg/dL — ABNORMAL HIGH (ref 8.9–10.3)
Chloride: 106 mmol/L (ref 98–111)
Creatinine: 0.89 mg/dL (ref 0.44–1.00)
GFR, Estimated: >60 mL/min (ref >60)
Glucose, Bld: 152 mg/dL — ABNORMAL HIGH (ref 70–99)
Potassium: 4.2 mmol/L (ref 3.5–5.1)
Sodium: 137 mmol/L (ref 135–145)
Total Bilirubin: 0.3 mg/dL (ref 0.3–1.2)
Total Protein: 8.3 g/dL — ABNORMAL HIGH (ref 6.5–8.1)

## 2022-03-19 LAB — CBC WITH DIFFERENTIAL (CANCER CENTER ONLY)
Abs Immature Granulocytes: 0.01 10*3/uL (ref 0.00–0.07)
Basophils Absolute: 0 10*3/uL (ref 0.0–0.1)
Basophils Relative: 1 %
Eosinophils Absolute: 0.1 10*3/uL (ref 0.0–0.5)
Eosinophils Relative: 1 %
HCT: 41.6 % (ref 36.0–46.0)
Hemoglobin: 14 g/dL (ref 12.0–15.0)
Immature Granulocytes: 0 %
Lymphocytes Relative: 28 %
Lymphs Abs: 2 10*3/uL (ref 0.7–4.0)
MCH: 28.5 pg (ref 26.0–34.0)
MCHC: 33.7 g/dL (ref 30.0–36.0)
MCV: 84.6 fL (ref 80.0–100.0)
Monocytes Absolute: 0.5 10*3/uL (ref 0.1–1.0)
Monocytes Relative: 7 %
Neutro Abs: 4.6 10*3/uL (ref 1.7–7.7)
Neutrophils Relative %: 63 %
Platelet Count: 321 10*3/uL (ref 150–400)
RBC: 4.92 MIL/uL (ref 3.87–5.11)
RDW: 12.6 % (ref 11.5–15.5)
WBC Count: 7.2 10*3/uL (ref 4.0–10.5)
nRBC: 0 % (ref 0.0–0.2)

## 2022-03-19 NOTE — Telephone Encounter (Signed)
Patient came in this morning and we repeated her myeloma panel.  Thank you so much for letting us know.    [place],  Wilber Bihari, NP

## 2022-03-20 LAB — KAPPA/LAMBDA LIGHT CHAINS
Kappa free light chain: 26.8 mg/L — ABNORMAL HIGH (ref 3.3–19.4)
Kappa, lambda light chain ratio: 2.65 — ABNORMAL HIGH (ref 0.26–1.65)
Lambda free light chains: 10.1 mg/L (ref 5.7–26.3)

## 2022-03-26 ENCOUNTER — Other Ambulatory Visit: Payer: Self-pay

## 2022-03-26 LAB — MULTIPLE MYELOMA PANEL, SERUM
Albumin SerPl Elph-Mcnc: 4.2 g/dL (ref 2.9–4.4)
Albumin/Glob SerPl: 1.3 (ref 0.7–1.7)
Alpha 1: 0.2 g/dL (ref 0.0–0.4)
Alpha2 Glob SerPl Elph-Mcnc: 0.9 g/dL (ref 0.4–1.0)
B-Globulin SerPl Elph-Mcnc: 1.2 g/dL (ref 0.7–1.3)
Gamma Glob SerPl Elph-Mcnc: 1.2 g/dL (ref 0.4–1.8)
Globulin, Total: 3.5 g/dL (ref 2.2–3.9)
IgA: 147 mg/dL (ref 87–352)
IgG (Immunoglobin G), Serum: 1373 mg/dL (ref 586–1602)
IgM (Immunoglobulin M), Srm: 82 mg/dL (ref 26–217)
M Protein SerPl Elph-Mcnc: 0.2 g/dL — ABNORMAL HIGH
Total Protein ELP: 7.7 g/dL (ref 6.0–8.5)

## 2022-03-27 ENCOUNTER — Other Ambulatory Visit: Payer: Self-pay

## 2022-03-29 ENCOUNTER — Other Ambulatory Visit: Payer: Self-pay

## 2022-03-30 ENCOUNTER — Telehealth: Payer: Self-pay | Admitting: Internal Medicine

## 2022-03-30 NOTE — Telephone Encounter (Signed)
Pt is calling in because she ran out of Trulicity last week. Pt says the pharmacy doesn't have the medication in stock and she isn't sure when they will get the medication back in stock. Pt wants to know are there any other options of medications she can take in its place. Please follow up with pt.

## 2022-03-30 NOTE — Telephone Encounter (Signed)
Routing to PCP for review.

## 2022-04-03 NOTE — Telephone Encounter (Signed)
[place] with patient information given per PCP"there is a Tree surgeon on the 1.5 mg dose of Trulicity.  It is expected to continue for several weeks.  In the meantime, we can switch her to Ozempic which is in the same class as Trulicity with same possible side effects OR we can decrease the dose of the Trulicity to the [id number] mg dose which she was on previously until the 1.5 mg dose becomes available in stock.  If she decides to go with Ozempic, she will need to have the pharmacist show her how to administer the medication as it is a little different from [place] in that regards. . Patient voices that she would prefer to stay on Trulicity and would take the 0.75 mg dose.

## 2022-04-04 ENCOUNTER — Other Ambulatory Visit: Payer: Self-pay

## 2022-04-04 ENCOUNTER — Telehealth: Payer: Self-pay | Admitting: Pharmacist

## 2022-04-04 MED ORDER — TRULICITY 1.5 MG/0.5ML ~~LOC~~ SOAJ
1.5000 mg | SUBCUTANEOUS | 1 refills | Status: DC
  Filled 2022-04-04: qty 2, 28d supply, fill #0
  Filled 2022-04-19 – 2022-04-25 (×2): qty 2, 28d supply, fill #1
  Filled 2022-06-01: qty 2, 28d supply, fill #2
  Filled 2022-07-09: qty 2, 28d supply, fill #3
  Filled 2022-08-06: qty 2, 28d supply, fill #4
  Filled 2022-08-29: qty 2, 28d supply, fill #5

## 2022-04-04 MED ORDER — TRULICITY 0.75 MG/0.5ML ~~LOC~~ SOAJ
0.7500 mg | SUBCUTANEOUS | 3 refills | Status: DC
  Filled 2022-04-04: qty 2, 28d supply, fill #0

## 2022-04-04 NOTE — Telephone Encounter (Signed)
Received notice from patient's pharmacy that we now have supply of Trulicity 1.5mg  weekly. Patient is okay changing back to the 1.5mg  weekly dose. Rx for 1.5mg  Trulicity sent. Will route this to patient's PCP so she is aware.

## 2022-04-11 ENCOUNTER — Other Ambulatory Visit: Payer: Medicare Other

## 2022-04-11 NOTE — Progress Notes (Signed)
Patient Care Team: [name], MD as PCP - General (Internal Medicine) Fanny Skates, MD as Consulting Physician (General Surgery) Nicholas Lose, MD as Consulting Physician (Hematology and Oncology) Gery Pray, MD as Consulting Physician (Radiation Oncology) Rockwell Germany, RN as Oncology Nurse Navigator Mauro Kaufmann, RN as Oncology Nurse Navigator  DIAGNOSIS: No diagnosis found.  SUMMARY OF ONCOLOGIC HISTORY: Oncology History  Malignant neoplasm of upper-outer quadrant of right breast in female, estrogen receptor positive ([place])  01/27/2018 Mammogram   Screening Mammogram  Possible right breast mass; asymmetry in left breast.   02/10/2018 Initial Diagnosis   Screening mammogram detected right breast mass and asymmetry in the left breast.  The left breast asymmetry resolved.  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%, HER-2 +3+ by IHC, T1BN0 stage Ia clinical stage   03/24/2018 Surgery   Lumpectomy and SLNB: IDC, 1.2cm, grade 2, margins negative, 4 SLN negative, T1cN0, previously ER/PR/HER-2 positive with Ki-67 of 20%   03/24/2018 Cancer Staging   Staging form: Breast, AJCC 8th Edition - Pathologic stage from 03/24/2018: Stage IA (pT1c, pN0, cM0, G2, ER+, PR+, HER2+) - Signed by Gardenia Phlegm, NP on 03/26/2018   04/22/2018 - 06/24/2018 Chemotherapy   The patient had trastuzumab (HERCEPTIN) 336 mg in sodium chloride 0.9 % 250 mL chemo infusion, 4 mg/kg = 336 mg, Intravenous,  Once, 3 of 16 cycles Administration: 336 mg (04/22/2018), 168 mg (04/29/2018), 168 mg (05/21/2018), 168 mg (05/06/2018), 168 mg (05/13/2018), 168 mg (05/27/2018), 168 mg (06/03/2018), 168 mg (06/10/2018), 168 mg (06/17/2018), 168 mg (06/24/2018) PACLitaxel (TAXOL) 156 mg in sodium chloride 0.9 % 250 mL chemo infusion (</= 80mg /m2), 80 mg/m2 = 156 mg, Intravenous,  Once, 3 of 3  cycles Administration: 156 mg (04/22/2018), 156 mg (04/29/2018), 156 mg (05/21/2018), 156 mg (05/06/2018), 156 mg (05/13/2018), 156 mg (05/27/2018), 156 mg (06/03/2018), 156 mg (06/10/2018), 156 mg (06/17/2018), 156 mg (06/24/2018)  for chemotherapy treatment.    07/01/2018 - 03/31/2019 Chemotherapy         07/29/2018 - 08/22/2018 Radiation Therapy   Adjuvant XRT   09/05/2018 -  Anti-estrogen oral therapy   Anastrozole 1mg  daily, plan for 5-7 years      CHIEF COMPLIANT: Follow-up of right breast cancer on anastrozole    INTERVAL HISTORY: Sandra Shelton is a   ALLERGIES:  has No Known Allergies.  MEDICATIONS:  Current Outpatient Medications  Medication Sig Dispense Refill   amLODipine (NORVASC) 10 MG tablet TAKE 1 TABLET (10 MG TOTAL) BY MOUTH DAILY. 90 tablet 2   anastrozole (ARIMIDEX) 1 MG tablet TAKE 1 TABLET BY MOUTH ONCE DAILY 90 tablet 3   aspirin 81 MG tablet Take 1 tablet (81 mg total) by mouth daily. 100 tablet 2   blood glucose meter kit and supplies KIT Dispense based on patient and insurance preference. Use up to four times daily as directed. (FOR ICD-9 250.00, 250.01). 1 each 0   CALCIUM PO Take 15 mLs by mouth daily.     clopidogrel (PLAVIX) 75 MG tablet Take 1 tablet (75 mg total) by mouth daily. 90 tablet 1   Dulaglutide (TRULICITY) 1.5 0000000 SOPN Inject 1.5 mg into the skin once a week. 6 mL 1   Elderberry 575 MG/5ML SYRP Take 15 mLs by mouth daily as needed.      lisinopril (ZESTRIL) 10 MG tablet Take 1.5 tablets (15 mg total)  by mouth daily. 135 tablet 1   magnesium 30 MG tablet Take 1 tablet (30 mg total) by mouth daily.     metFORMIN (GLUCOPHAGE) 1000 MG tablet Take 1 tablet (1,000 mg total) by mouth 2 (two) times daily with a meal. 180 tablet 2   rosuvastatin (CRESTOR) 40 MG tablet Take 1 tablet (40 mg total) by mouth daily. 90 tablet 2   No current facility-administered medications for this visit.    PHYSICAL EXAMINATION: ECOG PERFORMANCE STATUS: {CHL ONC ECOG  PS:[phone]}  There were no vitals filed for this visit. There were no vitals filed for this visit.  BREAST:*** No palpable masses or nodules in either right or left breasts. No palpable axillary supraclavicular or infraclavicular adenopathy no breast tenderness or nipple discharge. (exam performed in the presence of a chaperone)  LABORATORY DATA:  I have reviewed the data as listed    Latest Ref Rng & Units 03/19/2022    9:09 AM 03/15/2022   11:31 AM 03/28/2021   10:31 AM  CMP  Glucose 70 - 99 mg/dL 152  102    BUN 8 - 23 mg/dL 13  13    Creatinine 0.44 - 1.00 mg/dL 0.89  0.86    Sodium 135 - 145 mmol/L 137  139    Potassium 3.5 - 5.1 mmol/L 4.2  4.2    Chloride 98 - 111 mmol/L 106  100    CO2 22 - 32 mmol/L 22  20    Calcium 8.9 - 10.3 mg/dL 10.6  11.3  10.7   Total Protein 6.5 - 8.1 g/dL 8.3  8.6  7.8   Total Bilirubin 0.3 - 1.2 mg/dL 0.3  <0.2    Alkaline Phos 38 - 126 U/L 60  73    AST 15 - 41 U/L 18  21    ALT 0 - 44 U/L 23  25      Lab Results  Component Value Date   WBC 7.2 03/19/2022   HGB 14.0 03/19/2022   HCT 41.6 03/19/2022   MCV 84.6 03/19/2022   PLT 321 03/19/2022   NEUTROABS 4.6 03/19/2022    ASSESSMENT & PLAN:  No problem-specific Assessment & Plan notes found for this encounter.    No orders of the defined types were placed in this encounter.  The patient has a good understanding of the overall plan. she agrees with it. she will call with any problems that may develop before the next visit here. Total time spent: 30 mins including face to face time and time spent for planning, charting and co-ordination of care   Suzzette Righter, [place] 04/11/22    I Gardiner Coins am acting as a Education administrator for [employer]  ***

## 2022-04-17 NOTE — Assessment & Plan Note (Signed)
Hypercalcemia work-up revealed serum protein electrophoresis with 0.3 g of M protein. Kappa: 25.3, kappa lambda ratio 2.94, Calcium 10.7, PTH 26: Normal, PTH RP less than 2, TSH 0.585, vitamin D30.1  Bone survey 04/15/2021: No lytic lesions 03/19/2022: Hemoglobin 14, Kappa 26.8, lambda 10.1, ratio 2.65, M protein 0.2 g, IgG kappa, calcium 10.6, creatinine 0.89, albumin 4.7  Patient has stable MGUS. 1 year lab and follow-up

## 2022-04-17 NOTE — Assessment & Plan Note (Signed)
02/04/2018:Screening mammogram detected right breast mass and asymmetry in the left breast.  The left breast asymmetry resolved.  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%, HER-2 +3+ by IHC, T1BN0 stage Ia clinical stage   03/24/2018: Rt BCS: Grade 2 IDC 1.2 cm  ER 100%, PR 70%, Ki-67 20%, HER-2 +3+ by IHC, T1BN0 stage Ia Left BCS: DCIS   Treatment plan: 1. Adj chemo with Taxol Herceptin weekly X 11 (stopped for peripheral neuropathy) started 04/22/2018-07/01/2018, now on [name] to complete 1 full year of therapy completed March 2021 2. Adjuvant radiation therapy completed 08/22/2018 3. Adjuvant antiestrogen therapy started 09/05/2018 ------------------------------------------------------------------------------------------------------------------------ Current treatment: Anastrozole 1 mg daily started 09/05/2018 Anastrozole toxicities: Mild hot flashes and muscle stiffness but otherwise doing quite well She is due for a bone density test next month. Hypertension: Being managed by her PCP.  She is worried about her blood pressure readings.   Breast cancer surveillance: 1.  Breast exam  11/15/2021: Benign 2. Mammograms scheduled for 05/01/2022 3.  Bone density 05/22/2021: T score -1.3: Normal   Return to clinic in 1 year for follow-up

## 2022-04-18 ENCOUNTER — Other Ambulatory Visit: Payer: Self-pay

## 2022-04-18 ENCOUNTER — Inpatient Hospital Stay: Payer: 59 | Attending: Hematology and Oncology | Admitting: Hematology and Oncology

## 2022-04-18 VITALS — BP 152/59 | HR 96 | Temp 97.7°F | Resp 18 | Ht 65.0 in | Wt 183.6 lb

## 2022-04-18 DIAGNOSIS — Z79811 Long term (current) use of aromatase inhibitors: Secondary | ICD-10-CM | POA: Diagnosis not present

## 2022-04-18 DIAGNOSIS — Z170 Estrogen receptor positive status [ER+]: Secondary | ICD-10-CM | POA: Insufficient documentation

## 2022-04-18 DIAGNOSIS — D472 Monoclonal gammopathy: Secondary | ICD-10-CM | POA: Diagnosis not present

## 2022-04-18 DIAGNOSIS — I10 Essential (primary) hypertension: Secondary | ICD-10-CM | POA: Diagnosis not present

## 2022-04-18 DIAGNOSIS — C50411 Malignant neoplasm of upper-outer quadrant of right female breast: Secondary | ICD-10-CM | POA: Diagnosis not present

## 2022-04-18 MED ORDER — ANASTROZOLE 1 MG PO TABS
ORAL_TABLET | Freq: Every day | ORAL | 3 refills | Status: DC
  Filled 2022-04-18: qty 90, 90d supply, fill #0
  Filled 2022-07-09: qty 90, 90d supply, fill #1
  Filled 2022-10-03: qty 90, 90d supply, fill #2
  Filled 2023-01-18: qty 90, 90d supply, fill #3

## 2022-04-19 ENCOUNTER — Other Ambulatory Visit: Payer: Self-pay

## 2022-04-19 ENCOUNTER — Telehealth: Payer: Self-pay | Admitting: Hematology and Oncology

## 2022-04-19 NOTE — Telephone Encounter (Signed)
[place] appointments per 4/3 los. Patient is aware of the made appointments.

## 2022-04-27 ENCOUNTER — Other Ambulatory Visit: Payer: Self-pay

## 2022-05-01 ENCOUNTER — Ambulatory Visit
Admission: RE | Admit: 2022-05-01 | Discharge: 2022-05-01 | Disposition: A | Discharge status: Home / Self Care | Payer: 59 | Source: Ambulatory Visit | Attending: Internal Medicine | Admitting: Internal Medicine

## 2022-05-01 DIAGNOSIS — Z1231 Encounter for screening mammogram for malignant neoplasm of breast: Secondary | ICD-10-CM

## 2022-05-14 ENCOUNTER — Other Ambulatory Visit: Payer: Self-pay

## 2022-06-05 ENCOUNTER — Other Ambulatory Visit: Payer: Self-pay

## 2022-07-17 ENCOUNTER — Encounter: Payer: Self-pay | Admitting: Internal Medicine

## 2022-07-17 ENCOUNTER — Ambulatory Visit: Payer: 59 | Attending: Internal Medicine | Admitting: Internal Medicine

## 2022-07-17 ENCOUNTER — Other Ambulatory Visit: Payer: Self-pay

## 2022-07-17 VITALS — BP 128/68 | HR 84 | Temp 99.0°F | Ht 65.0 in | Wt 182.0 lb

## 2022-07-17 DIAGNOSIS — I152 Hypertension secondary to endocrine disorders: Secondary | ICD-10-CM

## 2022-07-17 DIAGNOSIS — E785 Hyperlipidemia, unspecified: Secondary | ICD-10-CM

## 2022-07-17 DIAGNOSIS — Z7985 Long-term (current) use of injectable non-insulin antidiabetic drugs: Secondary | ICD-10-CM

## 2022-07-17 DIAGNOSIS — Z7984 Long term (current) use of oral hypoglycemic drugs: Secondary | ICD-10-CM

## 2022-07-17 DIAGNOSIS — E1169 Type 2 diabetes mellitus with other specified complication: Secondary | ICD-10-CM | POA: Diagnosis not present

## 2022-07-17 DIAGNOSIS — E1159 Type 2 diabetes mellitus with other circulatory complications: Secondary | ICD-10-CM

## 2022-07-17 LAB — GLUCOSE, POCT (MANUAL RESULT ENTRY): POC Glucose: 112 mg/dl — AB (ref 70–99)

## 2022-07-17 LAB — POCT GLYCOSYLATED HEMOGLOBIN (HGB A1C): HbA1c, POC (controlled diabetic range): 6.9 % (ref 0.0–7.0)

## 2022-07-17 MED ORDER — AMLODIPINE BESYLATE 10 MG PO TABS
10.0000 mg | ORAL_TABLET | Freq: Every day | ORAL | 2 refills | Status: DC
Start: 2022-07-17 — End: 2023-05-27
  Filled 2022-07-17: qty 90, fill #0
  Filled 2022-09-10: qty 90, 90d supply, fill #0
  Filled 2022-12-11: qty 90, 90d supply, fill #1
  Filled 2023-03-14: qty 90, 90d supply, fill #2

## 2022-07-17 MED ORDER — ROSUVASTATIN CALCIUM 40 MG PO TABS
40.0000 mg | ORAL_TABLET | Freq: Every day | ORAL | 2 refills | Status: DC
Start: 2022-07-17 — End: 2023-01-22
  Filled 2022-07-17 – 2022-09-10 (×2): qty 90, 90d supply, fill #0
  Filled 2022-12-11: qty 90, 90d supply, fill #1

## 2022-07-17 NOTE — Progress Notes (Signed)
Patient ID: Sandra Shelton, female    DOB: [birth year]  MRN: [id number]  CC: Diabetes (DM f/u. Med refills. /Requesting a handicap placard. /)   Subjective: Sandra Shelton is a 67 y.o. 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  BS 1-2 x a day; range 95-125 before Tries to eat healthy.  Loves fruits; taste buds have change with age.  Exercises but not consistently; less with the summer heat.  Feet hurt at times due to callous.  Saw podiatry last yr. Does not feel they are at the point where she needs to get back in.  She does soaks which helps. -Eye exam scheduled for 08/2022  HTN:  compliant with Norvasc and Lisinopril 10 mg each.  Took meds this a.m.  Ate some bacon this a.m Limits salt in foods Checks BP once a day.  Does not have log but gives range 125-130/70-75.   HL:  Compliant with Crestor 40 mg  Saw Dr. Pamelia Hoit in 04/2022.  MGUS stable.  Patient would like handicap placard.  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 100 tablet 2   blood glucose meter kit and supplies KIT Dispense based on patient and insurance preference. Use up to four times daily as directed. (FOR ICD-9 250.00, 250.01). 1 each 0   CALCIUM PO Take 15 mLs by mouth daily.     clopidogrel (PLAVIX) 75 MG tablet Take 1 tablet (75 mg total) by mouth daily. 90 tablet 1   Dulaglutide (TRULICITY) 1.5 MG/0.5ML SOPN Inject 1.5 mg into the skin once a week. 6 mL 1   Elderberry 575 MG/5ML SYRP Take 15 mLs by mouth daily as needed.      lisinopril (ZESTRIL) 10 MG tablet Take 1.5 tablets (15 mg total) by mouth daily. 135 tablet 1   magnesium 30 MG tablet Take 1 tablet (30 mg total) by mouth daily.  metFORMIN (GLUCOPHAGE) 1000 MG tablet Take 1 tablet (1,000 mg total) by mouth 2 (two) times daily with a meal. 180 tablet 2   No current facility-administered medications on file prior to visit.    No Known Allergies  Social History   Socioeconomic History   Marital status: Single    Spouse name: Not on file   Number of children: Not on file   Years of education: Not on file   Highest education level: Not on file  Occupational History   Not on file  Tobacco Use   Smoking status: Never   Smokeless tobacco: Never  Vaping Use   Vaping Use: Never used  Substance and Sexual Activity   Alcohol use: No   Drug use: No   Sexual activity: Not on file  Other Topics Concern    Not on file  Social History Narrative   Lives home alone.  Single.  Education Some college.  No children.     Social Determinants of Health   Financial Resource Strain: Low Risk  (12/01/2021)   Overall Financial Resource Strain (CARDIA)    Difficulty of Paying Living Expenses: Not hard at all  Food Insecurity: No Food Insecurity (12/01/2021)   Hunger Vital Sign    Worried About Running Out of Food in the Last Year: Never true    Ran Out of Food in the Last Year: Never true  Transportation Needs: No Transportation Needs (12/01/2021)   PRAPARE - Administrator, Civil Service (Medical): No    Lack of Transportation (Non-Medical): No  Physical Activity: Insufficiently Active (12/01/2021)   Exercise Vital Sign    Days of Exercise per Week: 3 days    Minutes of Exercise per Session: 30 min  Stress: No Stress Concern Present (12/01/2021)   [employer] of Occupational Health - Occupational Stress Questionnaire    Feeling of Stress : Not at all  Social Connections: Socially Isolated (12/01/2021)   Social Connection and Isolation Panel [NHANES]    Frequency of Communication with Friends and Family: More than three times a week    Frequency of Social Gatherings with Friends and Family: More than three times a week    Attends Religious Services: Never    Database administrator or Organizations: Not on file    Attends Banker Meetings: Never    Marital Status: Never married  Intimate Partner Violence: Not At Risk (12/01/2021)   Humiliation, Afraid, Rape, and Kick questionnaire    Fear of Current or Ex-Partner: No    Emotionally Abused: No    Physically Abused: No    Sexually Abused: No    Family History  Problem Relation Age of Onset   Diabetes Mellitus II Sister    Stroke Mother    Diabetes Mellitus II Mother    Prostate cancer Father    Diabetes Mellitus II Brother    Breast cancer Maternal Aunt    Diabetes Maternal Aunt    Colon cancer Neg Hx     Esophageal cancer Neg Hx    Stomach cancer Neg Hx    Pancreatic cancer Neg Hx    Liver disease Neg Hx     Past Surgical History:  Procedure Laterality Date   BREAST EXCISIONAL BIOPSY Right    BREAST LUMPECTOMY Right 03/2018   BREAST LUMPECTOMY WITH RADIOACTIVE SEED AND SENTINEL LYMPH NODE BIOPSY Right 03/24/2018   Procedure: RIGHT BREAST LUMPECTOMY WITH RADIOACTIVE SEED AND RIGHT AXILLARY DEEP SENTINEL LYMPH NODE BIOPSY INJECT BLUE  DYE RIGHT BREAST;  Surgeon: Claud Kelp, MD;  Location: [facility];  Service: General;  Laterality: Right;   COLONOSCOPY     PORT-A-CATH REMOVAL N/A 05/19/2019   Procedure: PORT REMOVAL;  Surgeon: Manus Rudd, MD;  Location: [facility];  Service: General;  Laterality: N/A;   PORTACATH PLACEMENT N/A 04/09/2018   Procedure: INSERTION PORT-A-CATH WITH ULTRASOUND;  Surgeon: Claud Kelp, MD;  Location: [facility];  Service: General;  Laterality: N/A;   TRANSCAROTID ARTERY REVASCULARIZATION  Left 07/24/2017   Procedure: TRANSCAROTID ARTERY REVASCULARIZATION, left;  Surgeon: Sherren Kerns, MD;  Location: [facility];  Service: Vascular;  Laterality: Left;   UTERINE FIBROID SURGERY     2005    ROS: Review of Systems Negative except as stated above  PHYSICAL EXAM: BP 128/68   Pulse 84   Temp 99 F (37.2 C) (Oral)   Ht 5\' 5"  (1.651 m)   Wt 182 lb (82.6 kg)   LMP  (LMP Unknown)   SpO2 100%   BMI 30.29 kg/m   Wt Readings from Last 3 Encounters:  07/17/22 182 lb (82.6 kg)  04/18/22 183 lb 9.6 oz (83.3 kg)  03/15/22 184 lb (83.5 kg)    Physical Exam  General appearance - alert, well appearing, older African-American female and in no distress Mental status - normal mood, behavior, speech, dress, motor activity, and thought processes Chest - clear to auscultation, no wheezes, rales or rhonchi, symmetric air entry Heart - normal rate, regular rhythm, normal S1, S2, no murmurs, rubs, clicks or gallops Extremities - peripheral  pulses normal, no pedal edema, no clubbing or cyanosis      Latest Ref Rng & Units 03/19/2022    9:09 AM 03/15/2022   11:31 AM 03/28/2021   10:31 AM  CMP  Glucose 70 - 99 mg/dL 962  952    BUN 8 - 23 mg/dL 13  13    Creatinine 8.41 - 1.00 mg/dL 3.24  4.01    Sodium 027 - 145 mmol/L 137  139    Potassium 3.5 - 5.1 mmol/L 4.2  4.2    Chloride 98 - 111 mmol/L 106  100    CO2 22 - 32 mmol/L 22  20    Calcium 8.9 - 10.3 mg/dL 25.3  66.4  40.3   Total Protein 6.5 - 8.1 g/dL 8.3  8.6  7.8   Total Bilirubin 0.3 - 1.2 mg/dL 0.3  <4.7    Alkaline Phos 38 - 126 U/L 60  73    AST 15 - 41 U/L 18  21    ALT 0 - 44 U/L 23  25     Lipid Panel     Component Value Date/Time   CHOL 170 03/15/2022 1131   TRIG 153 (H) 03/15/2022 1131   HDL 59 03/15/2022 1131   CHOLHDL 2.9 03/15/2022 1131   CHOLHDL 8.2 07/09/2017 0646   VLDL 76 (H) 07/09/2017 0646   LDLCALC 85 03/15/2022 1131    CBC    Component Value Date/Time   WBC 7.2 03/19/2022 0909   WBC 6.1 05/19/2019 0730   RBC 4.92 03/19/2022 0909   HGB 14.0 03/19/2022 0909   HGB 14.8 03/15/2022 1131   HCT 41.6 03/19/2022 0909   HCT 45.1 03/15/2022 1131   PLT 321 03/19/2022 0909   PLT 362 03/15/2022 1131   MCV 84.6 03/19/2022 0909   MCV 83 03/15/2022 1131   MCH 28.5 03/19/2022 0909   MCHC 33.7 03/19/2022 0909  RDW 12.6 03/19/2022 0909   RDW 12.5 03/15/2022 1131   LYMPHSABS 2.0 03/19/2022 0909   MONOABS 0.5 03/19/2022 0909   EOSABS 0.1 03/19/2022 0909   BASOSABS 0.0 03/19/2022 0909    ASSESSMENT AND PLAN:  1. Type 2 diabetes mellitus with other circulatory complications (HCC) At goal.  Continue Trulicity 1.5 mg once a week and metformin 1 g twice a day.  Encourage healthy eating habits.  Encouraged her to move more.  Discussed exercises that she can do indoors at home. - POCT glucose (manual entry) - POCT glycosylated hemoglobin (Hb A1C)  2. Hypertension associated with type 2 diabetes mellitus (HCC) At goal.  Continue Norvasc 10 mg  daily and lisinopril 10 mg daily. - amLODipine (NORVASC) 10 MG tablet; TAKE 1 TABLET (10 MG TOTAL) BY MOUTH DAILY.  Dispense: 90 tablet; Refill: 2  3. Hyperlipidemia associated with type 2 diabetes mellitus (HCC) Continue Crestor. - rosuvastatin (CRESTOR) 40 MG tablet; Take 1 tablet (40 mg total) by mouth daily.  Dispense: 90 tablet; Refill: 2  Advised patient that she does not meet criteria for handicap placard at this time.  Patient was given the opportunity to ask questions.  Patient verbalized understanding of the plan and was able to repeat key elements of the plan.   This documentation was completed using Paediatric nurse.  Any transcriptional errors are unintentional.  Orders Placed This Encounter  Procedures   POCT glucose (manual entry)   POCT glycosylated hemoglobin (Hb A1C)     Requested Prescriptions   Signed Prescriptions Disp Refills   amLODipine (NORVASC) 10 MG tablet 90 tablet 2    Sig: TAKE 1 TABLET (10 MG TOTAL) BY MOUTH DAILY.   rosuvastatin (CRESTOR) 40 MG tablet 90 tablet 2    Sig: Take 1 tablet (40 mg total) by mouth daily.    Return in about 4 months (around 11/17/2022).  Jonah Blue, MD, FACP

## 2022-08-08 ENCOUNTER — Other Ambulatory Visit: Payer: Self-pay

## 2022-08-20 NOTE — Patient Instructions (Signed)
Sandra Shelton , Thank you for taking time to come for your Medicare Wellness Visit. I appreciate your ongoing commitment to your health goals. Please review the following plan we discussed and let me know if I can assist you in the future.   Referrals/Orders/Follow-Ups/Clinician Recommendations: Aim for 30 minutes of exercise or brisk walking, 6-8 glasses of water, and 5 servings of fruits and vegetables each day.  This is a list of the screening recommended for you and due dates:  Health Maintenance  Topic Date Due   COVID-19 Vaccine (3 - Moderna risk series) 06/03/2020   Eye exam for diabetics  11/24/2021   Flu Shot  08/16/2022   Complete foot exam   11/21/2022   Medicare Annual Wellness Visit  12/02/2022   Hemoglobin A1C  01/17/2023   Yearly kidney health urinalysis for diabetes  03/15/2023   Yearly kidney function blood test for diabetes  03/19/2023   Mammogram  04/30/2024   Pneumonia Vaccine (3 of 3 - PPSV23 or PCV20) 10/29/2024   Colon Cancer Screening  07/20/2029   DTaP/Tdap/Td vaccine (2 - Td or Tdap) 03/14/2032   DEXA scan (bone density measurement)  Completed   Hepatitis C Screening  Completed   Zoster (Shingles) Vaccine  Completed   HPV Vaccine  Aged Out    Advanced directives: (ACP Link)Information on Advanced Care Planning can be found at [facility] Advance Health Care Directives Advance Health Care Directives ([email or web address]   Next Medicare Annual Wellness Visit scheduled for next year: Yes  Preventive Care 65 Years and Older, Female Preventive care refers to lifestyle choices and visits with your health care provider that can promote health and wellness. What does preventive care include? A yearly physical exam. This is also called an annual well check. Dental exams once or twice a year. Routine eye exams. Ask your health care provider how often you should have your eyes checked. Personal lifestyle choices, including: Daily care of your teeth and  gums. Regular physical activity. Eating a healthy diet. Avoiding tobacco and drug use. Limiting alcohol use. Practicing safe sex. Taking low-dose aspirin every day. Taking vitamin and mineral supplements as recommended by your health care provider. What happens during an annual well check? The services and screenings done by your health care provider during your annual well check will depend on your age, overall health, lifestyle risk factors, and family history of disease. Counseling  Your health care provider may ask you questions about your: Alcohol use. Tobacco use. Drug use. Emotional well-being. Home and relationship well-being. Sexual activity. Eating habits. History of falls. Memory and ability to understand (cognition). Work and work Astronomer. Reproductive health. Screening  You may have the following tests or measurements: Height, weight, and BMI. Blood pressure. Lipid and cholesterol levels. These may be checked every 5 years, or more frequently if you are over 66 years old. Skin check. Lung cancer screening. You may have this screening every year starting at age 41 if you have a 30-pack-year history of smoking and currently smoke or have quit within the past 15 years. Fecal occult blood test (FOBT) of the stool. You may have this test every year starting at age 64. Flexible sigmoidoscopy or colonoscopy. You may have a sigmoidoscopy every 5 years or a colonoscopy every 10 years starting at age 87. Hepatitis C blood test. Hepatitis B blood test. Sexually transmitted disease (STD) testing. Diabetes screening. This is done by checking your blood sugar (glucose) after you have not eaten for a  while (fasting). You may have this done every 1-3 years. Bone density scan. This is done to screen for osteoporosis. You may have this done starting at age 13. Mammogram. This may be done every 1-2 years. Talk to your health care provider about how often you should have regular  mammograms. Talk with your health care provider about your test results, treatment options, and if necessary, the need for more tests. Vaccines  Your health care provider may recommend certain vaccines, such as: Influenza vaccine. This is recommended every year. Tetanus, diphtheria, and acellular pertussis (Tdap, Td) vaccine. You may need a Td booster every 10 years. Zoster vaccine. You may need this after age 44. Pneumococcal 13-valent conjugate (PCV13) vaccine. One dose is recommended after age 23. Pneumococcal polysaccharide (PPSV23) vaccine. One dose is recommended after age 47. Talk to your health care provider about which screenings and vaccines you need and how often you need them. This information is not intended to replace advice given to you by your health care provider. Make sure you discuss any questions you have with your health care provider. Document Released: 01/28/2015 Document Revised: 09/21/2015 Document Reviewed: 11/02/2014 Elsevier Interactive Patient Education  2017 [employer].  Fall Prevention in the Home Falls can cause injuries. They can happen to people of all ages. There are many things you can do to make your home safe and to help prevent falls. What can I do on the outside of my home? Regularly fix the edges of walkways and driveways and fix any cracks. Remove anything that might make you trip as you walk through a door, such as a raised step or threshold. Trim any bushes or trees on the path to your home. Use bright outdoor lighting. Clear any walking paths of anything that might make someone trip, such as rocks or tools. Regularly check to see if handrails are loose or broken. Make sure that both sides of any steps have handrails. Any raised decks and porches should have guardrails on the edges. Have any leaves, snow, or ice cleared regularly. Use sand or salt on walking paths during winter. Clean up any spills in your garage right away. This includes oil  or grease spills. What can I do in the bathroom? Use night lights. Install grab bars by the toilet and in the tub and shower. Do not use towel bars as grab bars. Use non-skid mats or decals in the tub or shower. If you need to sit down in the shower, use a plastic, non-slip stool. Keep the floor dry. Clean up any water that spills on the floor as soon as it happens. Remove soap buildup in the tub or shower regularly. Attach bath mats securely with double-sided non-slip rug tape. Do not have throw rugs and other things on the floor that can make you trip. What can I do in the bedroom? Use night lights. Make sure that you have a light by your bed that is easy to reach. Do not use any sheets or blankets that are too big for your bed. They should not hang down onto the floor. Have a firm chair that has side arms. You can use this for support while you get dressed. Do not have throw rugs and other things on the floor that can make you trip. What can I do in the kitchen? Clean up any spills right away. Avoid walking on wet floors. Keep items that you use a lot in easy-to-reach places. If you need to reach something above you,  use a strong step stool that has a grab bar. Keep electrical cords out of the way. Do not use floor polish or wax that makes floors slippery. If you must use wax, use non-skid floor wax. Do not have throw rugs and other things on the floor that can make you trip. What can I do with my stairs? Do not leave any items on the stairs. Make sure that there are handrails on both sides of the stairs and use them. Fix handrails that are broken or loose. Make sure that handrails are as long as the stairways. Check any carpeting to make sure that it is firmly attached to the stairs. Fix any carpet that is loose or worn. Avoid having throw rugs at the top or bottom of the stairs. If you do have throw rugs, attach them to the floor with carpet tape. Make sure that you have a light  switch at the top of the stairs and the bottom of the stairs. If you do not have them, ask someone to add them for you. What else can I do to help prevent falls? Wear shoes that: Do not have high heels. Have rubber bottoms. Are comfortable and fit you well. Are closed at the toe. Do not wear sandals. If you use a stepladder: Make sure that it is fully opened. Do not climb a closed stepladder. Make sure that both sides of the stepladder are locked into place. Ask someone to hold it for you, if possible. Clearly mark and make sure that you can see: Any grab bars or handrails. First and last steps. Where the edge of each step is. Use tools that help you move around (mobility aids) if they are needed. These include: Canes. Walkers. Scooters. Crutches. Turn on the lights when you go into a dark area. Replace any light bulbs as soon as they burn out. Set up your furniture so you have a clear path. Avoid moving your furniture around. If any of your floors are uneven, fix them. If there are any pets around you, be aware of where they are. Review your medicines with your doctor. Some medicines can make you feel dizzy. This can increase your chance of falling. Ask your doctor what other things that you can do to help prevent falls. This information is not intended to replace advice given to you by your health care provider. Make sure you discuss any questions you have with your health care provider. Document Released: 10/28/2008 Document Revised: 06/09/2015 Document Reviewed: 02/05/2014 Elsevier Interactive Patient Education  2017 [employer].

## 2022-08-20 NOTE — Progress Notes (Unsigned)
Subjective:   Sandra Shelton is a 67 y.o. female who presents for Medicare Annual (Subsequent) preventive examination.  Visit Complete: {VISITMETHOD:[phone]}  Patient Medicare AWV questionnaire was completed by the patient on ***; I have confirmed that all information answered by patient is correct and no changes since this date.  Review of Systems    ***       Objective:    There were no vitals filed for this visit. There is no height or weight on file to calculate BMI.     04/18/2022   11:06 AM 12/01/2021    9:06 AM 11/15/2021   10:11 AM 11/24/2019   10:31 AM 05/19/2019    8:08 AM 01/06/2019    9:25 AM 09/23/2018   12:31 PM  Advanced Directives  Does Patient Have a Medical Advance Directive? No No No No No No No  Would patient like information on creating a medical advance directive? Yes (ED - Information included in AVS) Yes (MAU/Ambulatory/Procedural Areas - Information given) Yes (ED - Information included in AVS) Yes (Inpatient - patient defers creating a medical advance directive at this time - Information given) No - Patient declined Yes (ED - Information included in AVS)     Current Medications (verified) Outpatient Encounter Medications as of 08/21/2022  Medication Sig   amLODipine (NORVASC) 10 MG tablet TAKE 1 TABLET (10 MG TOTAL) BY MOUTH DAILY.   anastrozole (ARIMIDEX) 1 MG tablet TAKE 1 TABLET BY MOUTH ONCE DAILY   aspirin 81 MG tablet Take 1 tablet (81 mg total) by mouth daily.   blood glucose meter kit and supplies KIT Dispense based on patient and insurance preference. Use up to four times daily as directed. (FOR ICD-9 250.00, 250.01).   CALCIUM PO Take 15 mLs by mouth daily.   clopidogrel (PLAVIX) 75 MG tablet Take 1 tablet (75 mg total) by mouth daily.   Dulaglutide (TRULICITY) 1.5 MG/0.5ML SOPN Inject 1.5 mg into the skin once a week.   Elderberry 575 MG/5ML SYRP Take 15 mLs by mouth daily as needed.    lisinopril (ZESTRIL) 10 MG tablet Take 1.5 tablets (15  mg total) by mouth daily.   magnesium 30 MG tablet Take 1 tablet (30 mg total) by mouth daily.   metFORMIN (GLUCOPHAGE) 1000 MG tablet Take 1 tablet (1,000 mg total) by mouth 2 (two) times daily with a meal.   rosuvastatin (CRESTOR) 40 MG tablet Take 1 tablet (40 mg total) by mouth daily.   No facility-administered encounter medications on file as of 08/21/2022.    Allergies (verified) Patient has no known allergies.   History: Past Medical History:  Diagnosis Date   Breast cancer (HCC)     IDC - right breast    Diabetes mellitus without complication (HCC)    type 2   Embolic stroke involving carotid artery (HCC)    GERD (gastroesophageal reflux disease)    Heart murmur    no problems with it   Hepatitis    in the 80's, took meds at the time    HLD (hyperlipidemia)    Hypertension    Peripheral vascular disease (HCC)    carotid artery blockage    Personal history of chemotherapy    completed 03/2019   Personal history of radiation therapy    completed   Sleep apnea    Does not use CPAP   Stroke (HCC) 11/20/2017   Vision abnormalities    glasses   Past Surgical History:  Procedure Laterality Date  BREAST EXCISIONAL BIOPSY Right    BREAST LUMPECTOMY Right 03/2018   BREAST LUMPECTOMY WITH RADIOACTIVE SEED AND SENTINEL LYMPH NODE BIOPSY Right 03/24/2018   Procedure: RIGHT BREAST LUMPECTOMY WITH RADIOACTIVE SEED AND RIGHT AXILLARY DEEP SENTINEL LYMPH NODE BIOPSY INJECT BLUE DYE RIGHT BREAST;  Surgeon: Claud Kelp, MD;  Location: [facility];  Service: General;  Laterality: Right;   COLONOSCOPY     PORT-A-CATH REMOVAL N/A 05/19/2019   Procedure: PORT REMOVAL;  Surgeon: Manus Rudd, MD;  Location: [facility] OR;  Service: General;  Laterality: N/A;   PORTACATH PLACEMENT N/A 04/09/2018   Procedure: INSERTION PORT-A-CATH WITH ULTRASOUND;  Surgeon: Claud Kelp, MD;  Location: [facility];  Service: General;  Laterality: N/A;   TRANSCAROTID ARTERY  REVASCULARIZATION  Left 07/24/2017   Procedure: TRANSCAROTID ARTERY REVASCULARIZATION, left;  Surgeon: Sherren Kerns, MD;  Location: [facility] OR;  Service: Vascular;  Laterality: Left;   UTERINE FIBROID SURGERY     2005   Family History  Problem Relation Age of Onset   Diabetes Mellitus II Sister    Stroke Mother    Diabetes Mellitus II Mother    Prostate cancer Father    Diabetes Mellitus II Brother    Breast cancer Maternal Aunt    Diabetes Maternal Aunt    Colon cancer Neg Hx    Esophageal cancer Neg Hx    Stomach cancer Neg Hx    Pancreatic cancer Neg Hx    Liver disease Neg Hx    Social History   Socioeconomic History   Marital status: Single    Spouse name: Not on file   Number of children: Not on file   Years of education: Not on file   Highest education level: Not on file  Occupational History   Not on file  Tobacco Use   Smoking status: Never   Smokeless tobacco: Never  Vaping Use   Vaping status: Never Used  Substance and Sexual Activity   Alcohol use: No   Drug use: No   Sexual activity: Not on file  Other Topics Concern   Not on file  Social History Narrative   Lives home alone.  Single.  Education Some college.  No children.     Social Determinants of Health   Financial Resource Strain: Low Risk  (12/01/2021)   Overall Financial Resource Strain (CARDIA)    Difficulty of Paying Living Expenses: Not hard at all  Food Insecurity: No Food Insecurity (12/01/2021)   Hunger Vital Sign    Worried About Running Out of Food in the Last Year: Never true    Ran Out of Food in the Last Year: Never true  Transportation Needs: No Transportation Needs (12/01/2021)   PRAPARE - Administrator, Civil Service (Medical): No    Lack of Transportation (Non-Medical): No  Physical Activity: Insufficiently Active (12/01/2021)   Exercise Vital Sign    Days of Exercise per Week: 3 days    Minutes of Exercise per Session: 30 min  Stress: No Stress Concern Present  (12/01/2021)   [employer] of Occupational Health - Occupational Stress Questionnaire    Feeling of Stress : Not at all  Social Connections: Socially Isolated (12/01/2021)   Social Connection and Isolation Panel [NHANES]    Frequency of Communication with Friends and Family: More than three times a week    Frequency of Social Gatherings with Friends and Family: More than three times a week    Attends Religious Services: Never  Active Member of Clubs or Organizations: Not on file    Attends Club or Organization Meetings: Never    Marital Status: Never married    Tobacco Counseling Counseling given: Not Answered   Clinical Intake:                        Activities of Daily Living     No data to display          Patient Care Team: Marcine Matar, MD as PCP - General (Internal Medicine) Claud Kelp, MD as Consulting Physician (General Surgery) Serena Croissant, MD as Consulting Physician (Hematology and Oncology) Antony Blackbird, MD as Consulting Physician (Radiation Oncology) Donnelly Angelica, RN as Oncology Nurse Navigator Pershing Proud, RN as Oncology Nurse Navigator  Indicate any recent Medical Services you may have received from other than [facility] providers in the past year (date may be approximate).     Assessment:   This is a routine wellness examination for Darshay.  Hearing/Vision screen No results found.  Dietary issues and exercise activities discussed:     Goals Addressed   None    Depression Screen    07/17/2022    9:39 AM 03/15/2022   10:21 AM 03/15/2022   10:20 AM 12/01/2021    9:07 AM 11/14/2021   10:47 AM 06/23/2021    9:29 AM 03/23/2021    8:48 AM  PHQ 2/9 Scores  PHQ - 2 Score 0 0 0 0 0 0 0  PHQ- 9 Score 0 0         Fall Risk    07/17/2022    9:33 AM 07/17/2022    9:32 AM 03/15/2022   10:12 AM 12/01/2021    9:06 AM 11/14/2021   10:47 AM  Fall Risk   Falls in the past year? 0 0 0 0 0  Number falls in past yr: 0 0 0 0  0  Injury with Fall? 0 0 0 0 0  Risk for fall due to : No Fall Risks History of fall(s) No Fall Risks No Fall Risks No Fall Risks  Follow up    Falls evaluation completed     MEDICARE RISK AT HOME:   TIMED UP AND GO:  Was the test performed?  No    Cognitive Function:    11/24/2019   10:32 AM  MMSE - Mini Mental State Exam  Orientation to time 5  Orientation to Place 5  Registration 3  Attention/ Calculation 5  Recall 3  Language- name 2 objects 2  Language- repeat 1  Language- follow 3 step command 3  Language- read & follow direction 1  Write a sentence 1  Copy design 1  Total score 30        12/01/2021    9:07 AM  6CIT Screen  What Year? 0 points  What month? 0 points  What time? 0 points  Count back from 20 0 points  Months in reverse 0 points  Repeat phrase 0 points  Total Score 0 points    Immunizations Immunization History  Administered Date(s) Administered   Fluad Quad(high Dose 65+) 11/25/2018   Influenza,inj,Quad PF,6+ Mos 10/30/2019, 11/22/2020, 11/14/2021   Moderna SARS-COV2 Booster Vaccination 05/06/2020   Moderna Sars-Covid-2 Vaccination 03/25/2019, 04/22/2019   Pneumococcal Conjugate-13 10/30/2019   Pneumococcal Polysaccharide-23 09/02/2017   Tdap 03/15/2022   Zoster Recombinant(Shingrix) 12/07/2020, 02/06/2021    TDAP status: Up to date  Flu Vaccine status: Due, Education has been  provided regarding the importance of this vaccine. Advised may receive this vaccine at local pharmacy or Health Dept. Aware to provide a copy of the vaccination record if obtained from local pharmacy or Health Dept. Verbalized acceptance and understanding.  Pneumococcal vaccine status: Up to date  Covid-19 vaccine status: Information provided on how to obtain vaccines.   Qualifies for Shingles Vaccine? Yes   Zostavax completed No   Shingrix Completed?: Yes  Screening Tests Health Maintenance  Topic Date Due   COVID-19 Vaccine (3 - Moderna risk series)  06/03/2020   OPHTHALMOLOGY EXAM  11/24/2021   INFLUENZA VACCINE  08/16/2022   FOOT EXAM  11/21/2022   Medicare Annual Wellness (AWV)  12/02/2022   HEMOGLOBIN A1C  01/17/2023   Diabetic kidney evaluation - Urine ACR  03/15/2023   Diabetic kidney evaluation - eGFR measurement  03/19/2023   MAMMOGRAM  04/30/2024   Pneumonia Vaccine 28+ Years old (3 of 3 - PPSV23 or PCV20) 10/29/2024   Colonoscopy  07/20/2029   DTaP/Tdap/Td (2 - Td or Tdap) 03/14/2032   DEXA SCAN  Completed   Hepatitis C Screening  Completed   Zoster Vaccines- Shingrix  Completed   HPV VACCINES  Aged Out    Health Maintenance  Health Maintenance Due  Topic Date Due   COVID-19 Vaccine (3 - Moderna risk series) 06/03/2020   OPHTHALMOLOGY EXAM  11/24/2021   INFLUENZA VACCINE  08/16/2022    Colorectal cancer screening: Type of screening: Colonoscopy. Completed 07/21/19. Repeat every 10 years  Mammogram status: Completed 05/01/22. Repeat every year  Bone Density status: Completed 05/22/21. Results reflect: Bone density results: OSTEOPENIA. Repeat every 2 years.  Lung Cancer Screening: (Low Dose CT Chest recommended if Age 43-80 years, 20 pack-year currently smoking OR have quit w/in 15years.) does not qualify.   Lung Cancer Screening Referral: n/a  Additional Screening:  Hepatitis C Screening: does qualify; Completed 03/23/21  Vision Screening: Recommended annual ophthalmology exams for early detection of glaucoma and other disorders of the eye. Is the patient up to date with their annual eye exam?  {YES/NO:21197} Who is the provider or what is the name of the office in which the patient attends annual eye exams? *** If pt is not established with a provider, would they like to be referred to a provider to establish care? {YES/NO:21197}.   Dental Screening: Recommended annual dental exams for proper oral hygiene  Diabetic Foot Exam: Diabetic Foot Exam: Completed 11/20/21  Community Resource Referral / Chronic Care  Management: CRR required this visit?  {YES/NO:21197}  CCM required this visit?  {CCM Required choices:[phone]}     Plan:     I have personally reviewed and noted the following in the patient's chart:   Medical and social history Use of alcohol, tobacco or illicit drugs  Current medications and supplements including opioid prescriptions. {Opioid Prescriptions:[phone]} Functional ability and status Nutritional status Physical activity Advanced directives List of other physicians Hospitalizations, surgeries, and ER visits in previous 12 months Vitals Screenings to include cognitive, depression, and falls Referrals and appointments  In addition, I have reviewed and discussed with patient certain preventive protocols, quality metrics, and best practice recommendations. A written personalized care plan for preventive services as well as general preventive health recommendations were provided to patient.     Kandis Fantasia [place], [place]   01/20/1094   After Visit Summary: {CHL AMB AWV After Visit Summary:[id number]}  Nurse Notes: ***

## 2022-08-21 ENCOUNTER — Ambulatory Visit: Payer: 59 | Attending: Internal Medicine

## 2022-08-21 VITALS — Ht 65.0 in | Wt 182.0 lb

## 2022-08-21 DIAGNOSIS — Z0000 Encounter for general adult medical examination without abnormal findings: Secondary | ICD-10-CM | POA: Diagnosis not present

## 2022-08-29 ENCOUNTER — Other Ambulatory Visit: Payer: Self-pay

## 2022-08-29 ENCOUNTER — Other Ambulatory Visit: Payer: Self-pay | Admitting: Internal Medicine

## 2022-08-29 DIAGNOSIS — I6523 Occlusion and stenosis of bilateral carotid arteries: Secondary | ICD-10-CM

## 2022-08-29 DIAGNOSIS — E1159 Type 2 diabetes mellitus with other circulatory complications: Secondary | ICD-10-CM

## 2022-08-29 MED ORDER — CLOPIDOGREL BISULFATE 75 MG PO TABS
75.0000 mg | ORAL_TABLET | Freq: Every day | ORAL | 1 refills | Status: DC
Start: 2022-08-29 — End: 2023-03-14
  Filled 2022-08-29 – 2022-08-30 (×2): qty 90, 90d supply, fill #0
  Filled 2022-12-11: qty 90, 90d supply, fill #1

## 2022-08-30 ENCOUNTER — Other Ambulatory Visit: Payer: Self-pay

## 2022-09-07 ENCOUNTER — Other Ambulatory Visit (HOSPITAL_COMMUNITY): Payer: Self-pay

## 2022-09-10 ENCOUNTER — Other Ambulatory Visit: Payer: Self-pay

## 2022-09-10 DIAGNOSIS — H35033 Hypertensive retinopathy, bilateral: Secondary | ICD-10-CM | POA: Diagnosis not present

## 2022-09-10 DIAGNOSIS — H25013 Cortical age-related cataract, bilateral: Secondary | ICD-10-CM | POA: Diagnosis not present

## 2022-09-10 DIAGNOSIS — H2513 Age-related nuclear cataract, bilateral: Secondary | ICD-10-CM | POA: Diagnosis not present

## 2022-09-10 DIAGNOSIS — H524 Presbyopia: Secondary | ICD-10-CM | POA: Diagnosis not present

## 2022-09-10 DIAGNOSIS — E119 Type 2 diabetes mellitus without complications: Secondary | ICD-10-CM | POA: Diagnosis not present

## 2022-09-10 LAB — HM DIABETES EYE EXAM

## 2022-09-11 ENCOUNTER — Other Ambulatory Visit: Payer: Self-pay

## 2022-09-28 ENCOUNTER — Other Ambulatory Visit: Payer: Self-pay | Admitting: Internal Medicine

## 2022-09-28 ENCOUNTER — Other Ambulatory Visit: Payer: Self-pay

## 2022-09-28 MED ORDER — TRULICITY 1.5 MG/0.5ML ~~LOC~~ SOAJ
1.5000 mg | SUBCUTANEOUS | 1 refills | Status: DC
  Filled 2022-09-28: qty 2, 28d supply, fill #0
  Filled 2022-10-25: qty 2, 28d supply, fill #1
  Filled 2022-12-20: qty 2, 28d supply, fill #0
  Filled 2023-01-18: qty 2, 28d supply, fill #1

## 2022-10-03 ENCOUNTER — Other Ambulatory Visit: Payer: Self-pay

## 2022-10-04 ENCOUNTER — Other Ambulatory Visit: Payer: Self-pay

## 2022-10-29 ENCOUNTER — Other Ambulatory Visit: Payer: Self-pay

## 2022-10-29 ENCOUNTER — Other Ambulatory Visit: Payer: Self-pay | Admitting: Internal Medicine

## 2022-10-29 DIAGNOSIS — E1159 Type 2 diabetes mellitus with other circulatory complications: Secondary | ICD-10-CM

## 2022-10-29 MED ORDER — METFORMIN HCL 1000 MG PO TABS
1000.0000 mg | ORAL_TABLET | Freq: Two times a day (BID) | ORAL | 0 refills | Status: DC
Start: 2022-10-29 — End: 2023-01-22
  Filled 2022-10-29: qty 180, 90d supply, fill #0

## 2022-10-30 ENCOUNTER — Other Ambulatory Visit: Payer: Self-pay

## 2022-11-05 ENCOUNTER — Other Ambulatory Visit: Payer: Self-pay

## 2022-11-05 ENCOUNTER — Encounter: Payer: Self-pay | Admitting: Pharmacist

## 2022-11-05 ENCOUNTER — Ambulatory Visit: Payer: 59 | Attending: Internal Medicine | Admitting: Pharmacist

## 2022-11-05 DIAGNOSIS — Z7984 Long term (current) use of oral hypoglycemic drugs: Secondary | ICD-10-CM

## 2022-11-05 DIAGNOSIS — E1159 Type 2 diabetes mellitus with other circulatory complications: Secondary | ICD-10-CM

## 2022-11-05 DIAGNOSIS — I152 Hypertension secondary to endocrine disorders: Secondary | ICD-10-CM

## 2022-11-05 DIAGNOSIS — Z7189 Other specified counseling: Secondary | ICD-10-CM

## 2022-11-05 DIAGNOSIS — Z7985 Long-term (current) use of injectable non-insulin antidiabetic drugs: Secondary | ICD-10-CM

## 2022-11-05 MED ORDER — LISINOPRIL 10 MG PO TABS
15.0000 mg | ORAL_TABLET | Freq: Every day | ORAL | 1 refills | Status: DC
Start: 2022-11-05 — End: 2022-11-12
  Filled 2022-11-05: qty 135, 90d supply, fill #0

## 2022-11-05 NOTE — Progress Notes (Signed)
11/05/2022 Name: Sandra Shelton MRN: [id number] DOB: [birth year]  No chief complaint on file.  Sandra Shelton is a 67 y.o. year old female who presented for a telephone visit.   They were referred to the pharmacist by a quality report for assistance in managing hypertension. Identified as a [facility] measure.    Subjective:  Care Team: Primary Care Provider: Marcine Matar, MD ; Next Scheduled Visit: 11/19/2022  Medication Access/Adherence  Current Pharmacy:  [facility] [address], [address] [place] [address] Phone: [phone] Fax: [phone]  Patient reports affordability concerns with their medications: No  Patient reports access/transportation concerns to their pharmacy: No  Patient reports adherence concerns with their medications:  No    Pharmacy Quality Measure Review  This patient is appearing on a report for being at risk of failing the adherence measure for hypertension (ACEi/ARB) medications this calendar year.   Medication: lisinopril Last fill date: 08/08/2022 for 90 day supply  Insurance report was not up to date. No action needed at this time.  and Contacted pharmacy to facilitate refills.   Objective:  Lab Results  Component Value Date   HGBA1C 6.9 07/17/2022    Lab Results  Component Value Date   CREATININE 0.89 03/19/2022   BUN 13 03/19/2022   NA 137 03/19/2022   K 4.2 03/19/2022   CL 106 03/19/2022   CO2 22 03/19/2022    Lab Results  Component Value Date   CHOL 170 03/15/2022   HDL 59 03/15/2022   LDLCALC 85 03/15/2022   TRIG 153 (H) 03/15/2022   CHOLHDL 2.9 03/15/2022    Medications Reviewed Today     Reviewed by Drucilla Chalet, RPH-CPP (Pharmacist) on 11/05/22 at [phone]  Med List Status: <None>   Medication Order Taking? Sig Documenting Provider Last Dose Status Informant  amLODipine (NORVASC) 10 MG tablet [id number] No Take 1 tablet (10 mg total) by mouth daily.  Marcine Matar, MD Taking Active   anastrozole (ARIMIDEX) 1 MG tablet [id number] No TAKE 1 TABLET BY MOUTH ONCE DAILY Serena Croissant, MD Taking Active   aspirin 81 MG tablet [id number] No Take 1 tablet (81 mg total) by mouth daily. Marcine Matar, MD Taking Active Self  blood glucose meter kit and supplies KIT [id number] No Dispense based on patient and insurance preference. Use up to four times daily as directed. (FOR ICD-9 250.00, 250.01). Albertine Grates, MD Taking Active Self  CALCIUM PO [id number] No Take 15 mLs by mouth daily. [provider] Taking Active Self  clopidogrel (PLAVIX) 75 MG tablet [id number]  Take 1 tablet (75 mg total) by mouth daily. Marcine Matar, MD  Active   Dulaglutide (TRULICITY) 1.5 MG/0.5ML Namon Cirri [id number]  Inject 1.5 mg into the skin once a week. Marcine Matar, MD  Active   Elderberry 575 MG/5ML Greer Pickerel [id number] No Take 15 mLs by mouth daily as needed.  [provider] Taking Active Self  lisinopril (ZESTRIL) 10 MG tablet [id number] No Take 1.5 tablets (15 mg total) by mouth daily. Marcine Matar, MD Taking Active   magnesium 30 MG tablet [id number] No Take 1 tablet (30 mg total) by mouth daily. Serena Croissant, MD Taking Active   metFORMIN (GLUCOPHAGE) 1000 MG tablet [id number]  Take 1 tablet (1,000 mg total) by mouth 2 (two) times daily with a meal. Marcine Matar, MD  Active   rosuvastatin (CRESTOR) 40 MG tablet [id number] No Take 1 tablet (40 mg  total) by mouth daily. Marcine Matar, MD Taking Active               Assessment/Plan:   Medication Management: - Currently strategy sufficient to maintain appropriate adherence to prescribed medication regimen - Sent new rxn for lisinopril to our pharmacy to fill.   Follow Up Plan: in 2 weeks with PCP.   Butch Penny, PharmD, Patsy [place], CPP Clinical Pharmacist [facility] [phone]

## 2022-11-09 ENCOUNTER — Telehealth: Payer: Self-pay | Admitting: Hematology and Oncology

## 2022-11-09 ENCOUNTER — Ambulatory Visit: Payer: Self-pay

## 2022-11-09 ENCOUNTER — Other Ambulatory Visit: Payer: Self-pay

## 2022-11-09 NOTE — Telephone Encounter (Signed)
Summary: med ?   Melvin from [facility] called , for pt, wanting to know if she should keep taking 1 a day of lisinopril (ZESTRIL) 10 MG tablet like she has or 1.5. Please cb pt     Call cannot be completed as dialed.  Medication was increased to 1.5 tablets on med list, but no mention of increase in notes.

## 2022-11-09 NOTE — Telephone Encounter (Signed)
Patient is currently out of town due to a loss in the family, patient is aware of cancelled appointment and stated that they will call back when ready to reschedule cancelled appointment

## 2022-11-09 NOTE — Telephone Encounter (Signed)
  Chief Complaint: Medication question Symptoms: none Frequency: now Pertinent Negatives: Patient denies  Disposition: [] ED /[] Urgent Care (no appt availability in office) / [] Appointment(In office/virtual)/ []  [facility]/ [] Home Care/ [] Refused Recommended Disposition /[] [facility]/ [x]  Follow-up with PCP Additional Notes: Winona Legato from [facility] -call could not be completed. Then called pt. PT is unsure what dosage she is on. She thinks that dr. Laural Benes was upping the dose, but not sure. Pt is currently in [place].  Pt has been on 10 Mg of Lisinopril, on 10/21 2024 the dosage on the med list was changed to 15mg , pt is to take 1.5 tablets.   I do not see any notes in chart changing dosage.  Please review and return call to pt.   Summary: med ?   Melvin from [facility] called , for pt, wanting to know if she should keep taking 1 a day of lisinopril (ZESTRIL) 10 MG tablet like she has or 1.5. Please cb pt      Medication was increased to 1.5 tablets on med list, but no mention of increase in notes.  Reason for Disposition  [1] Caller has URGENT medicine question about med that PCP or specialist prescribed AND [2] triager unable to answer question  Answer Assessment - Initial Assessment Questions 1. NAME of MEDICINE: "What medicine(s) are you calling about?"     Lisinopril 2. QUESTION: "What is your question?" (e.g., double dose of medicine, side effect)     What dose should pt be taking 3. PRESCRIBER: "Who prescribed the medicine?" Reason: if prescribed by specialist, call should be referred to that group.     Dr. Laural Benes  Protocols used: Medication Question Call-A-AH

## 2022-11-12 ENCOUNTER — Other Ambulatory Visit: Payer: Self-pay

## 2022-11-12 ENCOUNTER — Other Ambulatory Visit: Payer: Self-pay | Admitting: Pharmacist

## 2022-11-12 DIAGNOSIS — E1159 Type 2 diabetes mellitus with other circulatory complications: Secondary | ICD-10-CM

## 2022-11-12 MED ORDER — LISINOPRIL 10 MG PO TABS
10.0000 mg | ORAL_TABLET | Freq: Every day | ORAL | 1 refills | Status: DC
Start: 2022-11-12 — End: 2023-01-22
  Filled 2022-11-12 – 2022-12-11 (×2): qty 90, 90d supply, fill #0

## 2022-11-14 ENCOUNTER — Other Ambulatory Visit: Payer: Self-pay

## 2022-11-16 ENCOUNTER — Other Ambulatory Visit: Payer: Medicare Other

## 2022-11-16 ENCOUNTER — Ambulatory Visit: Payer: Medicare Other | Admitting: Hematology and Oncology

## 2022-11-19 ENCOUNTER — Ambulatory Visit: Payer: 59 | Admitting: Internal Medicine

## 2022-11-22 ENCOUNTER — Other Ambulatory Visit: Payer: Self-pay

## 2022-11-28 ENCOUNTER — Other Ambulatory Visit: Payer: Self-pay

## 2022-12-11 ENCOUNTER — Encounter: Payer: Self-pay | Admitting: Hematology and Oncology

## 2022-12-11 ENCOUNTER — Other Ambulatory Visit: Payer: Self-pay

## 2022-12-12 ENCOUNTER — Other Ambulatory Visit: Payer: Self-pay

## 2022-12-20 ENCOUNTER — Other Ambulatory Visit: Payer: Self-pay

## 2022-12-21 ENCOUNTER — Other Ambulatory Visit: Payer: Self-pay

## 2023-01-02 ENCOUNTER — Other Ambulatory Visit: Payer: Self-pay

## 2023-01-03 ENCOUNTER — Encounter: Payer: Self-pay | Admitting: Podiatry

## 2023-01-03 ENCOUNTER — Ambulatory Visit: Payer: 59 | Admitting: Podiatry

## 2023-01-03 DIAGNOSIS — L84 Corns and callosities: Secondary | ICD-10-CM | POA: Diagnosis not present

## 2023-01-03 DIAGNOSIS — E1149 Type 2 diabetes mellitus with other diabetic neurological complication: Secondary | ICD-10-CM

## 2023-01-03 DIAGNOSIS — E1140 Type 2 diabetes mellitus with diabetic neuropathy, unspecified: Secondary | ICD-10-CM | POA: Diagnosis not present

## 2023-01-04 NOTE — Progress Notes (Signed)
Subjective:   Patient ID: Sandra Shelton, female   DOB: 67 y.o.   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

## 2023-01-15 ENCOUNTER — Encounter: Payer: Self-pay | Admitting: Hematology and Oncology

## 2023-01-22 ENCOUNTER — Encounter: Payer: Self-pay | Admitting: Internal Medicine

## 2023-01-22 ENCOUNTER — Other Ambulatory Visit: Payer: Self-pay

## 2023-01-22 ENCOUNTER — Ambulatory Visit: Payer: 59 | Attending: Internal Medicine | Admitting: Internal Medicine

## 2023-01-22 VITALS — BP 124/71 | HR 101 | Temp 98.1°F | Ht 65.0 in | Wt 180.0 lb

## 2023-01-22 DIAGNOSIS — I152 Hypertension secondary to endocrine disorders: Secondary | ICD-10-CM

## 2023-01-22 DIAGNOSIS — E785 Hyperlipidemia, unspecified: Secondary | ICD-10-CM

## 2023-01-22 DIAGNOSIS — Z7984 Long term (current) use of oral hypoglycemic drugs: Secondary | ICD-10-CM | POA: Diagnosis not present

## 2023-01-22 DIAGNOSIS — Z23 Encounter for immunization: Secondary | ICD-10-CM

## 2023-01-22 DIAGNOSIS — E1159 Type 2 diabetes mellitus with other circulatory complications: Secondary | ICD-10-CM

## 2023-01-22 DIAGNOSIS — Z7985 Long-term (current) use of injectable non-insulin antidiabetic drugs: Secondary | ICD-10-CM

## 2023-01-22 DIAGNOSIS — Z170 Estrogen receptor positive status [ER+]: Secondary | ICD-10-CM

## 2023-01-22 DIAGNOSIS — C50411 Malignant neoplasm of upper-outer quadrant of right female breast: Secondary | ICD-10-CM | POA: Diagnosis not present

## 2023-01-22 DIAGNOSIS — E1169 Type 2 diabetes mellitus with other specified complication: Secondary | ICD-10-CM | POA: Diagnosis not present

## 2023-01-22 LAB — POCT GLYCOSYLATED HEMOGLOBIN (HGB A1C): HbA1c, POC (controlled diabetic range): 7.4 % — AB (ref 0.0–7.0)

## 2023-01-22 LAB — GLUCOSE, POCT (MANUAL RESULT ENTRY): POC Glucose: 108 mg/dL — AB (ref 70–99)

## 2023-01-22 MED ORDER — METFORMIN HCL 1000 MG PO TABS
1000.0000 mg | ORAL_TABLET | Freq: Two times a day (BID) | ORAL | 1 refills | Status: DC
  Filled 2023-01-22: qty 180, 90d supply, fill #0
  Filled 2023-05-09: qty 180, 90d supply, fill #1

## 2023-01-22 MED ORDER — LISINOPRIL 10 MG PO TABS
10.0000 mg | ORAL_TABLET | Freq: Every day | ORAL | 1 refills | Status: DC
Start: 2023-01-22 — End: 2023-05-27
  Filled 2023-01-22 – 2023-03-14 (×2): qty 90, 90d supply, fill #0

## 2023-01-22 MED ORDER — TRULICITY 3 MG/0.5ML ~~LOC~~ SOAJ
3.0000 mg | SUBCUTANEOUS | 6 refills | Status: DC
  Filled 2023-01-22: qty 2, 28d supply, fill #0
  Filled 2023-02-12: qty 2, 28d supply, fill #1
  Filled 2023-03-13: qty 2, 28d supply, fill #2
  Filled 2023-04-11: qty 2, 28d supply, fill #3
  Filled 2023-05-09: qty 2, 28d supply, fill #4
  Filled 2023-06-06: qty 2, 28d supply, fill #5
  Filled 2023-07-03: qty 2, 28d supply, fill #6

## 2023-01-22 MED ORDER — ROSUVASTATIN CALCIUM 40 MG PO TABS
40.0000 mg | ORAL_TABLET | Freq: Every day | ORAL | 2 refills | Status: DC
  Filled 2023-01-22 – 2023-03-14 (×2): qty 90, 90d supply, fill #0
  Filled 2023-06-17: qty 90, 90d supply, fill #1
  Filled 2023-09-25: qty 90, 90d supply, fill #2

## 2023-01-22 NOTE — Progress Notes (Signed)
 Patient ID: Sandra Shelton, female    DOB: 05/29/1955  MRN: [id number]  CC: Diabetes (DM f/u. Med refill. /No questions / concerns/Yes to flu & pneumonia vax)   Subjective: Sandra Shelton is a 68 y.o. female who presents for chronic ds management. 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),  LT CAS s/p TCAR (trans-carotid artery revascularization) with stent, RT breast CA ESR pos, MGUS    DM: Results for orders placed or performed in visit on 01/22/23  POCT glucose (manual entry)   Collection Time: 01/22/23  1:37 PM  Result Value Ref Range   POC Glucose 108 (A) 70 - 99 mg/dl  POCT glycosylated hemoglobin (Hb A1C)   Collection Time: 01/22/23  1:40 PM  Result Value Ref Range   Hemoglobin A1C     HbA1c POC (<> result, manual entry)     HbA1c, POC (prediabetic range)     HbA1c, POC (controlled diabetic range) 7.4 (A) 0.0 - 7.0 %  Compliant with Trulicity  1.5 mg/wk and Metformin  1 gram BID.  Tolerating Trulicity  Checks BS BID before meals 98-130 Loves litely salted popcorn, gel and yogart.  Eats small portions Active around her house  HTN:  compliant with Norvasc  and Lisinopril  10 mg daily Took meds already for today Checks BP once a day; not sure if device gives correct readings.  Range 130s/70s. Limits salt in foods  Hx of Breast CA; still on Arimadex; oncology palns to keep her on it for total of 7 yrs.  Up to date with MMG.  HL: Compliant with Crestor  40 mg   Fell 2 months ago in a parking lot.  Accidentally bumped into a cinderblock that she did not notice.  Fell on outstretched hands and bumped her left big toe.  The toe was sore for quite a while.  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 90 tablet 2   anastrozole  (ARIMIDEX ) 1 MG tablet TAKE 1 TABLET BY MOUTH ONCE DAILY 90 tablet 3   aspirin  81 MG tablet Take 1 tablet (81 mg total) by mouth daily. 100 tablet 2   blood glucose meter kit and supplies KIT Dispense based on patient and insurance preference. Use up to four times daily as directed. (FOR ICD-9 250.00, 250.01). 1 each 0   CALCIUM  PO Take 15 mLs by mouth daily.     clopidogrel  (PLAVIX ) 75 MG tablet Take 1 tablet (75 mg total) by mouth daily. 90 tablet 1   Elderberry 575 MG/5ML SYRP Take 15 mLs by mouth daily as needed.      magnesium  30 MG tablet Take 1 tablet (30 mg total) by mouth daily.  No current facility-administered medications on file prior to visit.    No Known Allergies  Social History   Socioeconomic History   Marital status: Single    Spouse name: Not on file   Number of children: Not on file   Years of education: Not on file   Highest education level: Not on file  Occupational History   Not on file  Tobacco Use   Smoking status: Never   Smokeless tobacco: Never  Vaping Use   Vaping status: Never Used  Substance and Sexual Activity   Alcohol use: No   Drug use: No   Sexual activity: Not on file  Other Topics Concern   Not on file  Social History Narrative   Lives home alone.  Single.  Education Some college.  No children.     Social  Drivers of Corporate Investment Banker Strain: Low Risk  (08/21/2022)   Overall Financial Resource Strain (CARDIA)    Difficulty of Paying Living Expenses: Not hard at all  Food Insecurity: No Food Insecurity (08/21/2022)   Hunger Vital Sign    Worried About Running Out of Food in the Last Year: Never true    Ran Out of Food in the Last Year: Never true  Transportation Needs: No Transportation Needs (08/21/2022)   PRAPARE - Administrator, Civil Service (Medical): No    Lack of Transportation (Non-Medical): No  Physical Activity: Insufficiently Active (08/21/2022)   Exercise Vital Sign    Days of Exercise per Week: 3 days    Minutes of Exercise per Session: 30 min  Stress: No Stress Concern Present (08/21/2022)   [employer] of Occupational Health - Occupational Stress Questionnaire    Feeling of Stress : Not at all  Social Connections: Socially Isolated (08/21/2022)   Social Connection and Isolation Panel [NHANES]    Frequency of Communication with Friends and Family: More than three times a week    Frequency of Social Gatherings with Friends and Family: Three times a week    Attends Religious Services: Never    Active Member of Clubs or Organizations: No    Attends Banker Meetings: Never    Marital Status: Never married  Intimate Partner Violence: Not At Risk (08/21/2022)   Humiliation, Afraid, Rape, and Kick questionnaire    Fear of Current or Ex-Partner: No    Emotionally Abused: No    Physically Abused: No    Sexually Abused: No    Family History  Problem Relation Age of Onset   Diabetes Mellitus II Sister    Stroke Mother    Diabetes Mellitus II Mother    Prostate cancer Father    Diabetes Mellitus II Brother    Breast cancer Maternal Aunt    Diabetes Maternal Aunt    Colon cancer Neg Hx    Esophageal cancer Neg Hx    Stomach cancer Neg Hx    Pancreatic cancer Neg Hx    Liver disease Neg Hx     Past Surgical History:  Procedure Laterality  Date   BREAST EXCISIONAL BIOPSY Right    BREAST LUMPECTOMY Right 03/2018   BREAST LUMPECTOMY WITH RADIOACTIVE SEED AND SENTINEL LYMPH NODE BIOPSY Right 03/24/2018   Procedure: RIGHT BREAST LUMPECTOMY WITH RADIOACTIVE SEED AND RIGHT AXILLARY DEEP SENTINEL LYMPH NODE BIOPSY INJECT BLUE DYE RIGHT BREAST;  Surgeon: Gail Favorite, MD;  Location: [facility];  Service: General;  Laterality: Right;   COLONOSCOPY     PORT-A-CATH REMOVAL  N/A 05/19/2019   Procedure: PORT REMOVAL;  Surgeon: Belinda Cough, MD;  Location: [facility] OR;  Service: General;  Laterality: N/A;   PORTACATH PLACEMENT N/A 04/09/2018   Procedure: INSERTION PORT-A-CATH WITH ULTRASOUND;  Surgeon: Gail Favorite, MD;  Location: [facility];  Service: General;  Laterality: N/A;   TRANSCAROTID ARTERY REVASCULARIZATION  Left 07/24/2017   Procedure: TRANSCAROTID ARTERY REVASCULARIZATION, left;  Surgeon: Harvey Carlin BRAVO, MD;  [facility];  Service: Vascular;  Laterality: Left;   UTERINE FIBROID SURGERY     2005    ROS: Review of Systems Negative except as stated above  PHYSICAL EXAM: BP 124/71   Pulse (!) 101   Temp 98.1 F (36.7 C) (Oral)   Ht 5' 5 (1.651 m)   Wt 180 lb (81.6 kg)   LMP  (LMP Unknown)   SpO2 100%   BMI 29.95 kg/m   Wt Readings from Last 3 Encounters:  01/22/23 180 lb (81.6 kg)  08/21/22 182 lb (82.6 kg)  07/17/22 182 lb (82.6 kg)    Physical Exam  General appearance - alert, well appearing, and in no distress Mental status - normal mood, behavior, speech, dress, motor activity, and thought processes Neck - supple, no significant adenopathy Chest - clear to auscultation, no wheezes, rales or rhonchi, symmetric air entry Heart - normal rate, regular rhythm, normal S1, S2, no murmurs, rubs, clicks or gallops Extremities - peripheral pulses normal, no pedal edema, no clubbing or cyanosis Diabetic Foot Exam - Simple   Simple Foot Form Diabetic Foot exam was performed with the  following findings: Yes 01/22/2023  2:11 PM  Visual Inspection See comments: Yes Sensation Testing Intact to touch and monofilament testing bilaterally: Yes Pulse Check Posterior Tibialis and Dorsalis pulse intact bilaterally: Yes Comments Small shaved calluses on the ball of both feet medially and laterally.         Latest Ref Rng & Units 03/19/2022    9:09 AM 03/15/2022   11:31 AM 03/28/2021   10:31 AM  CMP  Glucose 70 - 99 mg/dL 847  897    BUN 8 - 23 mg/dL 13  13    Creatinine 9.55 - 1.00 mg/dL 9.10  9.13    Sodium 864 - 145 mmol/L 137  139    Potassium 3.5 - 5.1 mmol/L 4.2  4.2    Chloride 98 - 111 mmol/L 106  100    CO2 22 - 32 mmol/L 22  20    Calcium  8.9 - 10.3 mg/dL 89.3  88.6  89.2   Total Protein 6.5 - 8.1 g/dL 8.3  8.6  7.8   Total Bilirubin 0.3 - 1.2 mg/dL 0.3  <9.7    Alkaline Phos 38 - 126 U/L 60  73    AST 15 - 41 U/L 18  21    ALT 0 - 44 U/L 23  25     Lipid Panel     Component Value Date/Time   CHOL 170 03/15/2022 1131   TRIG 153 (H) 03/15/2022 1131   HDL 59 03/15/2022 1131   CHOLHDL 2.9 03/15/2022 1131   CHOLHDL 8.2 07/09/2017 0646   VLDL 76 (H) 07/09/2017 0646   LDLCALC 85 03/15/2022 1131    CBC    Component Value Date/Time   WBC 7.2 03/19/2022 0909   WBC 6.1 05/19/2019 0730   RBC 4.92 03/19/2022 0909   HGB 14.0 03/19/2022 0909   HGB 14.8 03/15/2022 1131   HCT 41.6 03/19/2022 0909   HCT  45.1 03/15/2022 1131   PLT 321 03/19/2022 0909   PLT 362 03/15/2022 1131   MCV 84.6 03/19/2022 0909   MCV 83 03/15/2022 1131   MCH 28.5 03/19/2022 0909   MCHC 33.7 03/19/2022 0909   RDW 12.6 03/19/2022 0909   RDW 12.5 03/15/2022 1131   LYMPHSABS 2.0 03/19/2022 0909   MONOABS 0.5 03/19/2022 0909   EOSABS 0.1 03/19/2022 0909   BASOSABS 0.0 03/19/2022 0909    ASSESSMENT AND PLAN: 1. Type 2 diabetes mellitus with other circulatory complications (HCC) (Primary) Not at goal.  Encouraged her to continue healthy eating habits.  And trying to move as much as  she can.  Continue metformin  1 g twice a day.  Send she is tolerating Trulicity  and has not had as much weight loss that she would like, I recommend increasing the Trulicity  to 3 mg once a week.  Patient is agreeable to this plan.  Went over possible side effects of Trulicity  to watch out for and report to me if she develops palpitations, severe constipation or diarrhea, vomiting, abdominal pain. - POCT glycosylated hemoglobin (Hb A1C) - POCT glucose (manual entry) - metFORMIN  (GLUCOPHAGE ) 1000 MG tablet; Take 1 tablet (1,000 mg total) by mouth 2 (two) times daily with a meal.  Dispense: 180 tablet; Refill: 1 - CBC - Comprehensive metabolic panel - Dulaglutide  (TRULICITY ) 3 MG/0.5ML SOAJ; Inject 3 mg as directed once a week.  Dispense: 2 mL; Refill: 6  2. Hypertension associated with type 2 diabetes mellitus (HCC) Repeat blood pressure at goal.  She continue Norvasc  10 mg and lisinopril  10 mg daily - lisinopril  (ZESTRIL ) 10 MG tablet; Take 1 tablet (10 mg total) by mouth daily.  Dispense: 90 tablet; Refill: 1  3. Hyperlipidemia associated with type 2 diabetes mellitus (HCC) - rosuvastatin  (CRESTOR ) 40 MG tablet; Take 1 tablet (40 mg total) by mouth daily.  Dispense: 90 tablet; Refill: 2 - Lipid panel  4. Malignant neoplasm of upper-outer quadrant of right breast in female, estrogen receptor positive (HCC) Up-to-date with mammogram.  She continues to take Arimidex  as prescribed by her oncologist.  5. Need for influenza vaccination Given today.  6. Need for vaccination against Streptococcus pneumoniae Given PCV 20 today.              Patient was given the opportunity to ask questions.  Patient verbalized understanding of the plan and was able to repeat key elements of the plan.   This documentation was completed using Paediatric nurse.  Any transcriptional errors are unintentional.  Orders Placed This Encounter  Procedures   CBC   Comprehensive metabolic panel    Lipid panel   POCT glycosylated hemoglobin (Hb A1C)   POCT glucose (manual entry)     Requested Prescriptions   Signed Prescriptions Disp Refills   lisinopril  (ZESTRIL ) 10 MG tablet 90 tablet 1    Sig: Take 1 tablet (10 mg total) by mouth daily.   metFORMIN  (GLUCOPHAGE ) 1000 MG tablet 180 tablet 1    Sig: Take 1 tablet (1,000 mg total) by mouth 2 (two) times daily with a meal.   rosuvastatin  (CRESTOR ) 40 MG tablet 90 tablet 2    Sig: Take 1 tablet (40 mg total) by mouth daily.   Dulaglutide  (TRULICITY ) 3 MG/0.5ML SOAJ 2 mL 6    Sig: Inject 3 mg as directed once a week.    Return in about 4 months (around 05/22/2023).  Barnie Louder, MD, FACP

## 2023-01-23 LAB — CBC
Hematocrit: 43.1 % (ref 34.0–46.6)
Hemoglobin: 13.9 g/dL (ref 11.1–15.9)
MCH: 27.3 pg (ref 26.6–33.0)
MCHC: 32.3 g/dL (ref 31.5–35.7)
MCV: 85 fL (ref 79–97)
Platelets: 382 10*3/uL (ref 150–450)
RBC: 5.09 x10E6/uL (ref 3.77–5.28)
RDW: 12.6 % (ref 11.7–15.4)
WBC: 8 10*3/uL (ref 3.4–10.8)

## 2023-01-23 LAB — COMPREHENSIVE METABOLIC PANEL
ALT: 23 [IU]/L (ref 0–32)
AST: 17 [IU]/L (ref 0–40)
Albumin: 4.7 g/dL (ref 3.9–4.9)
Alkaline Phosphatase: 71 [IU]/L (ref 44–121)
BUN/Creatinine Ratio: 20 (ref 12–28)
BUN: 17 mg/dL (ref 8–27)
Bilirubin Total: <0.2 mg/dL (ref 0.0–1.2)
CO2: 22 mmol/L (ref 20–29)
Calcium: 10.6 mg/dL — ABNORMAL HIGH (ref 8.7–10.3)
Chloride: 104 mmol/L (ref 96–106)
Creatinine, Ser: 0.84 mg/dL (ref 0.57–1.00)
Globulin, Total: 3.1 g/dL (ref 1.5–4.5)
Glucose: 103 mg/dL — ABNORMAL HIGH (ref 70–99)
Potassium: 4.8 mmol/L (ref 3.5–5.2)
Sodium: 141 mmol/L (ref 134–144)
Total Protein: 7.8 g/dL (ref 6.0–8.5)
eGFR: 75 mL/min/{1.73_m2} (ref >59)

## 2023-01-23 LAB — LIPID PANEL
Chol/HDL Ratio: 3.2 {ratio} (ref 0.0–4.4)
Cholesterol, Total: 143 mg/dL (ref 100–199)
HDL: 45 mg/dL (ref >39)
LDL Chol Calc (NIH): 81 mg/dL (ref 0–99)
Triglycerides: 90 mg/dL (ref 0–149)
VLDL Cholesterol Cal: 17 mg/dL (ref 5–40)

## 2023-02-12 ENCOUNTER — Other Ambulatory Visit: Payer: Self-pay | Admitting: *Deleted

## 2023-02-12 DIAGNOSIS — I6523 Occlusion and stenosis of bilateral carotid arteries: Secondary | ICD-10-CM

## 2023-02-13 ENCOUNTER — Other Ambulatory Visit: Payer: Self-pay

## 2023-02-25 ENCOUNTER — Encounter (HOSPITAL_COMMUNITY): Payer: 59

## 2023-02-25 ENCOUNTER — Ambulatory Visit: Payer: 59

## 2023-03-13 ENCOUNTER — Other Ambulatory Visit: Payer: Self-pay

## 2023-03-14 ENCOUNTER — Other Ambulatory Visit: Payer: Self-pay

## 2023-03-14 ENCOUNTER — Other Ambulatory Visit: Payer: Self-pay | Admitting: Hematology and Oncology

## 2023-03-14 ENCOUNTER — Other Ambulatory Visit: Payer: Self-pay | Admitting: Internal Medicine

## 2023-03-14 DIAGNOSIS — E1159 Type 2 diabetes mellitus with other circulatory complications: Secondary | ICD-10-CM

## 2023-03-14 DIAGNOSIS — I6523 Occlusion and stenosis of bilateral carotid arteries: Secondary | ICD-10-CM

## 2023-03-14 MED ORDER — CLOPIDOGREL BISULFATE 75 MG PO TABS
75.0000 mg | ORAL_TABLET | Freq: Every day | ORAL | 1 refills | Status: DC
  Filled 2023-03-14: qty 90, 90d supply, fill #0

## 2023-03-14 MED ORDER — ANASTROZOLE 1 MG PO TABS
ORAL_TABLET | Freq: Every day | ORAL | 3 refills | Status: AC
  Filled 2023-03-14: qty 90, fill #0
  Filled 2023-04-02: qty 90, 90d supply, fill #0
  Filled 2023-07-03: qty 90, 90d supply, fill #1
  Filled 2023-09-25: qty 90, 90d supply, fill #2
  Filled 2023-12-06: qty 90, 90d supply, fill #3

## 2023-03-15 ENCOUNTER — Other Ambulatory Visit: Payer: Self-pay

## 2023-03-22 ENCOUNTER — Other Ambulatory Visit: Payer: Self-pay

## 2023-03-25 ENCOUNTER — Other Ambulatory Visit: Payer: Self-pay | Admitting: Internal Medicine

## 2023-03-25 DIAGNOSIS — Z1231 Encounter for screening mammogram for malignant neoplasm of breast: Secondary | ICD-10-CM

## 2023-04-02 ENCOUNTER — Ambulatory Visit (HOSPITAL_COMMUNITY)
Admission: RE | Admit: 2023-04-02 | Discharge: 2023-04-02 | Disposition: A | Discharge status: Home / Self Care | Payer: 59 | Source: Ambulatory Visit | Attending: Physician Assistant | Admitting: Physician Assistant

## 2023-04-02 ENCOUNTER — Other Ambulatory Visit: Payer: Self-pay

## 2023-04-02 ENCOUNTER — Ambulatory Visit (INDEPENDENT_AMBULATORY_CARE_PROVIDER_SITE_OTHER): Payer: 59 | Admitting: Physician Assistant

## 2023-04-02 VITALS — BP 126/65 | HR 87 | Resp 18 | Ht 65.0 in | Wt 177.0 lb

## 2023-04-02 DIAGNOSIS — I6523 Occlusion and stenosis of bilateral carotid arteries: Secondary | ICD-10-CM | POA: Diagnosis not present

## 2023-04-02 NOTE — Progress Notes (Signed)
 HISTORY AND PHYSICAL     CC:  follow up. Requesting Provider:  Marcine Matar, MD  HPI: This is a 68 y.o. female here for follow up for carotid artery stenosis.  Pt is s/p left TCAR for symptomatic carotid artery stenosis on 07/24/2017 by Dr. Darrick Penna.   Pt was last seen 02/22/2022 and at that time she was doing well without neurological sx.   Pt returns today for follow up.    Pt denies any amaurosis fugax, speech difficulties, weakness, numbness, paralysis or clumsiness or facial droop.    She states that in November, she took a trip to [place] and was walking in a parking deck and tripped on the concrete parking stop.  She states that she has had some pain in the right knee since then.  She denies any claudication or rest pain.    She is compliant with her asa/statin/plavix  The pt is on a statin for cholesterol management.  The pt is on a daily aspirin.   Other AC:  Plavix The pt is on CCB, ACEI for hypertension.   The pt is  on medication for diabetes Tobacco hx:  never   Past Medical History:  Diagnosis Date   Breast cancer (HCC)     IDC - right breast    Diabetes mellitus without complication (HCC)    type 2   Embolic stroke involving carotid artery (HCC)    GERD (gastroesophageal reflux disease)    Heart murmur    no problems with it   Hepatitis    in the 80's, took meds at the time    HLD (hyperlipidemia)    Hypertension    Peripheral vascular disease (HCC)    carotid artery blockage    Personal history of chemotherapy    completed 03/2019   Personal history of radiation therapy    completed   Sleep apnea    Does not use CPAP   Stroke (HCC) 11/20/2017   Vision abnormalities    glasses    Past Surgical History:  Procedure Laterality Date   BREAST EXCISIONAL BIOPSY Right    BREAST LUMPECTOMY Right 03/2018   BREAST LUMPECTOMY WITH RADIOACTIVE SEED AND SENTINEL LYMPH NODE BIOPSY Right 03/24/2018   Procedure: RIGHT BREAST LUMPECTOMY WITH RADIOACTIVE SEED  AND RIGHT AXILLARY DEEP SENTINEL LYMPH NODE BIOPSY INJECT BLUE DYE RIGHT BREAST;  Surgeon: Claud Kelp, MD;  Location: [facility];  Service: General;  Laterality: Right;   COLONOSCOPY     PORT-A-CATH REMOVAL N/A 05/19/2019   Procedure: PORT REMOVAL;  Surgeon: Manus Rudd, MD;  Location: [facility] OR;  Service: General;  Laterality: N/A;   PORTACATH PLACEMENT N/A 04/09/2018   Procedure: INSERTION PORT-A-CATH WITH ULTRASOUND;  Surgeon: Claud Kelp, MD;  Location: [facility];  Service: General;  Laterality: N/A;   TRANSCAROTID ARTERY REVASCULARIZATION  Left 07/24/2017   Procedure: TRANSCAROTID ARTERY REVASCULARIZATION, left;  Surgeon: Sherren Kerns, MD;  Location: [facility] OR;  Service: Vascular;  Laterality: Left;   UTERINE FIBROID SURGERY     2005    No Known Allergies  Current Outpatient Medications  Medication Sig Dispense Refill   amLODipine (NORVASC) 10 MG tablet Take 1 tablet (10 mg total) by mouth daily. 90 tablet 2   anastrozole (ARIMIDEX) 1 MG tablet TAKE 1 TABLET BY MOUTH ONCE DAILY 90 tablet 3   aspirin 81 MG tablet Take 1 tablet (81 mg total) by mouth daily. 100 tablet 2   blood glucose meter kit and supplies  KIT Dispense based on patient and insurance preference. Use up to four times daily as directed. (FOR ICD-9 250.00, 250.01). 1 each 0   CALCIUM PO Take 15 mLs by mouth daily.     clopidogrel (PLAVIX) 75 MG tablet Take 1 tablet (75 mg total) by mouth daily. 90 tablet 1   Dulaglutide (TRULICITY) 3 MG/0.5ML SOAJ Inject 3 mg as directed once a week. 2 mL 6   Elderberry 575 MG/5ML SYRP Take 15 mLs by mouth daily as needed.      lisinopril (ZESTRIL) 10 MG tablet Take 1 tablet (10 mg total) by mouth daily. 90 tablet 1   magnesium 30 MG tablet Take 1 tablet (30 mg total) by mouth daily.     metFORMIN (GLUCOPHAGE) 1000 MG tablet Take 1 tablet (1,000 mg total) by mouth 2 (two) times daily with a meal. 180 tablet 1   rosuvastatin (CRESTOR) 40 MG tablet Take  1 tablet (40 mg total) by mouth daily. 90 tablet 2   No current facility-administered medications for this visit.    Family History  Problem Relation Age of Onset   Diabetes Mellitus II Sister    Stroke Mother    Diabetes Mellitus II Mother    Prostate cancer Father    Diabetes Mellitus II Brother    Breast cancer Maternal Aunt    Diabetes Maternal Aunt    Colon cancer Neg Hx    Esophageal cancer Neg Hx    Stomach cancer Neg Hx    Pancreatic cancer Neg Hx    Liver disease Neg Hx     Social History   Socioeconomic History   Marital status: Single    Spouse name: Not on file   Number of children: Not on file   Years of education: Not on file   Highest education level: Not on file  Occupational History   Not on file  Tobacco Use   Smoking status: Never   Smokeless tobacco: Never  Vaping Use   Vaping status: Never Used  Substance and Sexual Activity   Alcohol use: No   Drug use: No   Sexual activity: Not on file  Other Topics Concern   Not on file  Social History Narrative   Lives home alone.  Single.  Education Some college.  No children.     Social Drivers of Corporate investment banker Strain: Low Risk  (01/22/2023)   Overall Financial Resource Strain (CARDIA)    Difficulty of Paying Living Expenses: Not hard at all  Food Insecurity: No Food Insecurity (01/22/2023)   Hunger Vital Sign    Worried About Running Out of Food in the Last Year: Never true    Ran Out of Food in the Last Year: Never true  Transportation Needs: No Transportation Needs (01/22/2023)   PRAPARE - Administrator, Civil Service (Medical): No    Lack of Transportation (Non-Medical): No  Physical Activity: Insufficiently Active (01/22/2023)   Exercise Vital Sign    Days of Exercise per Week: 3 days    Minutes of Exercise per Session: 30 min  Stress: No Stress Concern Present (01/22/2023)   [employer] of Occupational Health - Occupational Stress Questionnaire    Feeling of Stress  : Not at all  Social Connections: Socially Isolated (01/22/2023)   Social Connection and Isolation Panel [NHANES]    Frequency of Communication with Friends and Family: More than three times a week    Frequency of Social Gatherings with Friends and Family:  Three times a week    Attends Religious Services: Never    Active Member of Clubs or Organizations: No    Attends Banker Meetings: Never    Marital Status: Never married  Intimate Partner Violence: Not At Risk (01/22/2023)   Humiliation, Afraid, Rape, and Kick questionnaire    Fear of Current or Ex-Partner: No    Emotionally Abused: No    Physically Abused: No    Sexually Abused: No     REVIEW OF SYSTEMS:   [X]  denotes positive finding, [ ]  denotes negative finding Cardiac  Comments:  Chest pain or chest pressure:    Shortness of breath upon exertion:    Short of breath when lying flat:    Irregular heart rhythm:        Vascular    Pain in calf, thigh, or hip brought on by ambulation:    Pain in feet at night that wakes you up from your sleep:     Blood clot in your veins:    Leg swelling:         Pulmonary    Oxygen at home:    Productive cough:     Wheezing:         Neurologic    Sudden weakness in arms or legs:     Sudden numbness in arms or legs:     Sudden onset of difficulty speaking or slurred speech:    Temporary loss of vision in one eye:     Problems with dizziness:         Gastrointestinal    Blood in stool:     Vomited blood:         Genitourinary    Burning when urinating:     Blood in urine:        Psychiatric    Major depression:         Hematologic    Bleeding problems:    Problems with blood clotting too easily:        Skin    Rashes or ulcers:        Constitutional    Fever or chills:      PHYSICAL EXAMINATION:  Today's Vitals   04/02/23 1140 04/02/23 1141  BP: 126/65   Pulse: 87   Resp: 18   SpO2: 99%   Weight: 177 lb (80.3 kg)   Height: 5\' 5"  (1.651 m)    PainSc: 0-No pain 0-No pain   Body mass index is 29.45 kg/m.   General:  WDWN in NAD; vital signs documented above Gait: Not observed HENT: WNL, normocephalic Pulmonary: normal non-labored breathing Cardiac: regular HR, without carotid bruits Skin: without rashes Vascular Exam/Pulses:  Right Left  Radial 2+ (normal) 2+ (normal)   Extremities: without open wounds Musculoskeletal: no muscle wasting or atrophy  Neurologic: A&O X 3; moving all extremities equally; speech is fluent/normal Psychiatric:  The pt has Normal affect.   Non-Invasive Vascular Imaging:   Carotid Duplex on 04/02/2023 Right:  1-39% ICA stenosis Left:  patent stent without restenosis   Previous Carotid duplex on 02/22/2022: Right: 1-39% ICA stenosis Left:   patent stent without stenosis    ASSESSMENT/PLAN:: 68 y.o. female here for follow up carotid artery stenosis and is s/p left TCAR for symptomatic carotid artery stenosis on 07/24/2017 by Dr. Darrick Penna.   -duplex today reveals left carotid stent is patent without stenosis and right ICA stenosis remains 1-39%  she remains asymptomatic.  -discussed s/s of stroke with pt  and she understands should she develop any of these sx, she will go to the nearest ER or call 911. -pt will f/u in one year with carotid duplex -pt will call sooner should she have any issues. -continue statin/asa/plavix   Doreatha Massed, [facility] [phone]  Clinic MD:  Chestine Spore

## 2023-04-03 ENCOUNTER — Telehealth: Payer: Self-pay | Admitting: Internal Medicine

## 2023-04-03 NOTE — Telephone Encounter (Signed)
  Chief Complaint: patient calling for advice for medication questions regarding insulin for her sister Symptoms: patient sister taking 2 different kinds of insulin and now taking Ozempic. Reports patient sister having loose stools. Frequency: na Pertinent Negatives: Patient denies na Disposition: [] ED /[] Urgent Care (no appt availability in office) / [] Appointment(In office/virtual)/ []  [facility]/ [x] Home Care/ [] Refused Recommended Disposition /[] [facility]/ []  Follow-up with PCP Additional Notes:   Recommended patient contact a pharmacist with specific questions regarding insulins and to have patient sister f/u with sisters PCP , located in another state or endocrinologist for further understanding of medications/ side effects and how to take medications. Patient verbalized understanding.         Copied from CRM [id number]. Topic: General - Call Back - No Documentation >> Apr 03, 2023  2:40 PM Geneva B wrote: Reason for CRM: patients wants to speak to someone about a rx she just has a question

## 2023-04-11 ENCOUNTER — Inpatient Hospital Stay: Payer: 59 | Attending: Hematology and Oncology

## 2023-04-11 ENCOUNTER — Other Ambulatory Visit: Payer: Self-pay

## 2023-04-11 DIAGNOSIS — Z170 Estrogen receptor positive status [ER+]: Secondary | ICD-10-CM | POA: Diagnosis not present

## 2023-04-11 DIAGNOSIS — Z79811 Long term (current) use of aromatase inhibitors: Secondary | ICD-10-CM | POA: Insufficient documentation

## 2023-04-11 DIAGNOSIS — C50411 Malignant neoplasm of upper-outer quadrant of right female breast: Secondary | ICD-10-CM | POA: Insufficient documentation

## 2023-04-11 DIAGNOSIS — D472 Monoclonal gammopathy: Secondary | ICD-10-CM | POA: Insufficient documentation

## 2023-04-11 DIAGNOSIS — Z1731 Human epidermal growth factor receptor 2 positive status: Secondary | ICD-10-CM | POA: Diagnosis not present

## 2023-04-11 DIAGNOSIS — Z1721 Progesterone receptor positive status: Secondary | ICD-10-CM | POA: Diagnosis not present

## 2023-04-11 LAB — CBC WITH DIFFERENTIAL (CANCER CENTER ONLY)
Abs Immature Granulocytes: 0.02 10*3/uL (ref 0.00–0.07)
Basophils Absolute: 0 10*3/uL (ref 0.0–0.1)
Basophils Relative: 1 %
Eosinophils Absolute: 0.1 10*3/uL (ref 0.0–0.5)
Eosinophils Relative: 1 %
HCT: 40.7 % (ref 36.0–46.0)
Hemoglobin: 13.4 g/dL (ref 12.0–15.0)
Immature Granulocytes: 0 %
Lymphocytes Relative: 32 %
Lymphs Abs: 2.1 10*3/uL (ref 0.7–4.0)
MCH: 27 pg (ref 26.0–34.0)
MCHC: 32.9 g/dL (ref 30.0–36.0)
MCV: 81.9 fL (ref 80.0–100.0)
Monocytes Absolute: 0.5 10*3/uL (ref 0.1–1.0)
Monocytes Relative: 8 %
Neutro Abs: 3.8 10*3/uL (ref 1.7–7.7)
Neutrophils Relative %: 58 %
Platelet Count: 313 10*3/uL (ref 150–400)
RBC: 4.97 MIL/uL (ref 3.87–5.11)
RDW: 13 % (ref 11.5–15.5)
WBC Count: 6.6 10*3/uL (ref 4.0–10.5)
nRBC: 0 % (ref 0.0–0.2)

## 2023-04-11 LAB — CMP (CANCER CENTER ONLY)
ALT: 14 U/L (ref 0–44)
AST: 14 U/L — ABNORMAL LOW (ref 15–41)
Albumin: 4.7 g/dL (ref 3.5–5.0)
Alkaline Phosphatase: 52 U/L (ref 38–126)
Anion gap: 6 (ref 5–15)
BUN: 14 mg/dL (ref 8–23)
CO2: 26 mmol/L (ref 22–32)
Calcium: 10.5 mg/dL — ABNORMAL HIGH (ref 8.9–10.3)
Chloride: 107 mmol/L (ref 98–111)
Creatinine: 0.79 mg/dL (ref 0.44–1.00)
GFR, Estimated: >60 mL/min (ref >60)
Glucose, Bld: 151 mg/dL — ABNORMAL HIGH (ref 70–99)
Potassium: 4.1 mmol/L (ref 3.5–5.1)
Sodium: 139 mmol/L (ref 135–145)
Total Bilirubin: 0.3 mg/dL (ref 0.0–1.2)
Total Protein: 8.1 g/dL (ref 6.5–8.1)

## 2023-04-12 ENCOUNTER — Other Ambulatory Visit: Payer: Self-pay

## 2023-04-12 LAB — KAPPA/LAMBDA LIGHT CHAINS
Kappa free light chain: 21.6 mg/L — ABNORMAL HIGH (ref 3.3–19.4)
Kappa, lambda light chain ratio: 2.6 — ABNORMAL HIGH (ref 0.26–1.65)
Lambda free light chains: 8.3 mg/L (ref 5.7–26.3)

## 2023-04-15 LAB — MULTIPLE MYELOMA PANEL, SERUM
Albumin SerPl Elph-Mcnc: 4.2 g/dL (ref 2.9–4.4)
Albumin/Glob SerPl: 1.3 (ref 0.7–1.7)
Alpha 1: 0.2 g/dL (ref 0.0–0.4)
Alpha2 Glob SerPl Elph-Mcnc: 0.8 g/dL (ref 0.4–1.0)
B-Globulin SerPl Elph-Mcnc: 1.2 g/dL (ref 0.7–1.3)
Gamma Glob SerPl Elph-Mcnc: 1.1 g/dL (ref 0.4–1.8)
Globulin, Total: 3.3 g/dL (ref 2.2–3.9)
IgA: 129 mg/dL (ref 87–352)
IgG (Immunoglobin G), Serum: 1311 mg/dL (ref 586–1602)
IgM (Immunoglobulin M), Srm: 73 mg/dL (ref 26–217)
M Protein SerPl Elph-Mcnc: 0.3 g/dL — ABNORMAL HIGH
Total Protein ELP: 7.5 g/dL (ref 6.0–8.5)

## 2023-04-18 ENCOUNTER — Other Ambulatory Visit: Payer: Self-pay

## 2023-04-18 ENCOUNTER — Inpatient Hospital Stay: Payer: 59 | Attending: Hematology and Oncology | Admitting: Hematology and Oncology

## 2023-04-18 VITALS — BP 140/60 | HR 98 | Temp 98.0°F | Resp 18 | Ht 65.0 in | Wt 175.9 lb

## 2023-04-18 DIAGNOSIS — Z780 Asymptomatic menopausal state: Secondary | ICD-10-CM | POA: Diagnosis not present

## 2023-04-18 DIAGNOSIS — Z170 Estrogen receptor positive status [ER+]: Secondary | ICD-10-CM | POA: Diagnosis not present

## 2023-04-18 DIAGNOSIS — C50411 Malignant neoplasm of upper-outer quadrant of right female breast: Secondary | ICD-10-CM

## 2023-04-18 DIAGNOSIS — Z1721 Progesterone receptor positive status: Secondary | ICD-10-CM | POA: Insufficient documentation

## 2023-04-18 DIAGNOSIS — Z79811 Long term (current) use of aromatase inhibitors: Secondary | ICD-10-CM | POA: Diagnosis not present

## 2023-04-18 DIAGNOSIS — D472 Monoclonal gammopathy: Secondary | ICD-10-CM | POA: Diagnosis not present

## 2023-04-18 DIAGNOSIS — Z9221 Personal history of antineoplastic chemotherapy: Secondary | ICD-10-CM | POA: Diagnosis not present

## 2023-04-18 DIAGNOSIS — Z923 Personal history of irradiation: Secondary | ICD-10-CM | POA: Diagnosis not present

## 2023-04-18 DIAGNOSIS — Z1731 Human epidermal growth factor receptor 2 positive status: Secondary | ICD-10-CM | POA: Insufficient documentation

## 2023-04-18 NOTE — Assessment & Plan Note (Signed)
 Hypercalcemia work-up revealed serum protein electrophoresis with 0.3 g of M protein. Kappa: 25.3, kappa lambda ratio 2.94, Calcium 10.7, PTH 26: Normal, PTH RP less than 2, TSH 0.585, vitamin D30.1  Bone survey 04/15/2021: No lytic lesions 03/19/2022: Hemoglobin 14, Kappa 26.8, lambda 10.1, ratio 2.65, M protein 0.2 g, IgG kappa, calcium 10.6, creatinine 0.89, albumin 4.7 04/11/23: Hb 13.4, M-Protein: 0.3 Gm IgG Kappa, Kappa 21.6, Lambda 8.3, Ratio: 2.6, Ca: 10.5, Alb 4.7, Cr: 0.79   Patient has stable MGUS. 1 year lab and follow-up

## 2023-04-18 NOTE — Assessment & Plan Note (Signed)
 02/04/2018:Screening mammogram detected right breast mass and asymmetry in the left breast.  The left breast asymmetry resolved.  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%, HER-2 +3+ by IHC, T1BN0 stage Ia clinical stage   03/24/2018: Rt BCS: Grade 2 IDC 1.2 cm  ER 100%, PR 70%, Ki-67 20%, HER-2 +3+ by IHC, T1BN0 stage Ia Left BCS: DCIS   Treatment plan: 1. Adj chemo with Taxol Herceptin weekly X 11 (stopped for peripheral neuropathy) started 04/22/2018-07/01/2018, now on [name] to complete 1 full year of therapy completed March 2021 2. Adjuvant radiation therapy completed 08/22/2018 3. Adjuvant antiestrogen therapy started 09/05/2018 ------------------------------------------------------------------------------------------------------------------------ Current treatment: Anastrozole 1 mg daily started 09/05/2018 Anastrozole toxicities: Mild hot flashes and muscle stiffness but otherwise doing quite well She is due for another bone density test. Hypertension: Being managed by her PCP.  She is worried about her blood pressure readings.   Breast cancer surveillance: 1.  Breast exam  04/18/23: Benign 2. Mammograms scheduled for 05/01/2022 3.  Bone density 05/22/2021: T score -1.3: Normal   Return to clinic in 1 year for follow-up

## 2023-04-18 NOTE — Progress Notes (Signed)
 Patient Care Team: Marcine Matar, MD as PCP - General (Internal Medicine) Claud Kelp, MD as Consulting Physician (General Surgery) Serena Croissant, MD as Consulting Physician (Hematology and Oncology) Antony Blackbird, MD as Consulting Physician (Radiation Oncology) Donnelly Angelica, RN as Oncology Nurse Navigator Pershing Proud, RN as Oncology Nurse Navigator Herschel Senegal, OD (Optometry) McDonald, Rachelle Hora, DPM as Consulting Physician (Podiatry)  DIAGNOSIS:  Encounter Diagnoses  Name Primary?   Malignant neoplasm of upper-outer quadrant of right breast in female, estrogen receptor positive (HCC) Yes   MGUS (monoclonal gammopathy of unknown significance)    Post-menopausal     SUMMARY OF ONCOLOGIC HISTORY: Oncology History  Malignant neoplasm of upper-outer quadrant of right breast in female, estrogen receptor positive (HCC)  01/27/2018 Mammogram   Screening Mammogram  Possible right breast mass; asymmetry in left breast.   02/10/2018 Initial Diagnosis   Screening mammogram detected right breast mass and asymmetry in the left breast.  The left breast asymmetry resolved.  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%, HER-2 +3+ by IHC, T1BN0 stage Ia clinical stage   03/24/2018 Surgery   Lumpectomy and SLNB: IDC, 1.2cm, grade 2, margins negative, 4 SLN negative, T1cN0, previously ER/PR/HER-2 positive with Ki-67 of 20%   03/24/2018 Cancer Staging   Staging form: Breast, AJCC 8th Edition - Pathologic stage from 03/24/2018: Stage IA (pT1c, pN0, cM0, G2, ER+, PR+, HER2+) - Signed by Loa Socks, NP on 03/26/2018   04/22/2018 - 06/24/2018 Chemotherapy   The patient had trastuzumab (HERCEPTIN) 336 mg in sodium chloride 0.9 % 250 mL chemo infusion, 4 mg/kg = 336 mg, Intravenous,  Once, 3 of 16 cycles Administration: 336 mg (04/22/2018), 168 mg (04/29/2018), 168 mg  (05/21/2018), 168 mg (05/06/2018), 168 mg (05/13/2018), 168 mg (05/27/2018), 168 mg (06/03/2018), 168 mg (06/10/2018), 168 mg (06/17/2018), 168 mg (06/24/2018) PACLitaxel (TAXOL) 156 mg in sodium chloride 0.9 % 250 mL chemo infusion (</= 80mg /m2), 80 mg/m2 = 156 mg, Intravenous,  Once, 3 of 3 cycles Administration: 156 mg (04/22/2018), 156 mg (04/29/2018), 156 mg (05/21/2018), 156 mg (05/06/2018), 156 mg (05/13/2018), 156 mg (05/27/2018), 156 mg (06/03/2018), 156 mg (06/10/2018), 156 mg (06/17/2018), 156 mg (06/24/2018)  for chemotherapy treatment.    07/01/2018 - 03/31/2019 Chemotherapy         07/29/2018 - 08/22/2018 Radiation Therapy   Adjuvant XRT   09/05/2018 -  Anti-estrogen oral therapy   Anastrozole 1mg  daily, plan for 5-7 years      CHIEF COMPLIANT: Follow-up on anastrozole therapy, follow-up of MGUS  HISTORY OF PRESENT ILLNESS: History of Present Illness The patient, with a history of an unspecified condition, has been on a medication since August 2020. The patient tolerates the medication well and reports no adverse effects. The patient's recent blood work shows stable hemoglobin levels, a slight increase in M protein from 0.2 to 0.3, an improvement in kappa levels compared to the previous year, and persistently high calcium levels. The patient's kidney function is reported as perfect. The patient remains active, engaging in walking exercises when the weather permits.     ALLERGIES:  has no known allergies.  MEDICATIONS:  Current Outpatient Medications  Medication Sig Dispense Refill   amLODipine (NORVASC) 10 MG tablet Take 1 tablet (10 mg total) by mouth daily. 90 tablet 2   anastrozole (ARIMIDEX) 1 MG tablet TAKE 1 TABLET BY MOUTH ONCE DAILY 90  tablet 3   aspirin 81 MG tablet Take 1 tablet (81 mg total) by mouth daily. 100 tablet 2   blood glucose meter kit and supplies KIT Dispense based on patient and insurance preference. Use up to four times daily as directed. (FOR ICD-9 250.00, 250.01). 1 each 0    CALCIUM PO Take 15 mLs by mouth daily.     clopidogrel (PLAVIX) 75 MG tablet Take 1 tablet (75 mg total) by mouth daily. 90 tablet 1   Dulaglutide (TRULICITY) 3 MG/0.5ML SOAJ Inject 3 mg as directed once a week. 2 mL 6   Elderberry 575 MG/5ML SYRP Take 15 mLs by mouth daily as needed.      lisinopril (ZESTRIL) 10 MG tablet Take 1 tablet (10 mg total) by mouth daily. 90 tablet 1   magnesium 30 MG tablet Take 1 tablet (30 mg total) by mouth daily.     metFORMIN (GLUCOPHAGE) 1000 MG tablet Take 1 tablet (1,000 mg total) by mouth 2 (two) times daily with a meal. 180 tablet 1   rosuvastatin (CRESTOR) 40 MG tablet Take 1 tablet (40 mg total) by mouth daily. 90 tablet 2   No current facility-administered medications for this visit.    PHYSICAL EXAMINATION: ECOG PERFORMANCE STATUS: 1 - Symptomatic but completely ambulatory  Vitals:   04/18/23 1045  BP: (!) 140/60  Pulse: 98  Resp: 18  Temp: 98 F (36.7 C)  SpO2: 99%   Filed Weights   04/18/23 1045  Weight: 175 lb 14.4 oz (79.8 kg)      LABORATORY DATA:  I have reviewed the data as listed    Latest Ref Rng & Units 04/11/2023   10:20 AM 01/22/2023    2:24 PM 03/19/2022    9:09 AM  CMP  Glucose 70 - 99 mg/dL 161  096  045   BUN 8 - 23 mg/dL 14  17  13    Creatinine 0.44 - 1.00 mg/dL 4.09  8.11  9.14   Sodium 135 - 145 mmol/L 139  141  137   Potassium 3.5 - 5.1 mmol/L 4.1  4.8  4.2   Chloride 98 - 111 mmol/L 107  104  106   CO2 22 - 32 mmol/L 26  22  22    Calcium 8.9 - 10.3 mg/dL 78.2  95.6  21.3   Total Protein 6.5 - 8.1 g/dL 8.1  7.8  8.3   Total Bilirubin 0.0 - 1.2 mg/dL 0.3  <0.8  0.3   Alkaline Phos 38 - 126 U/L 52  71  60   AST 15 - 41 U/L 14  17  18    ALT 0 - 44 U/L 14  23  23      Lab Results  Component Value Date   WBC 6.6 04/11/2023   HGB 13.4 04/11/2023   HCT 40.7 04/11/2023   MCV 81.9 04/11/2023   PLT 313 04/11/2023   NEUTROABS 3.8 04/11/2023    ASSESSMENT & PLAN:  Malignant neoplasm of upper-outer quadrant  of right breast in female, estrogen receptor positive (HCC) 02/04/2018:Screening mammogram detected right breast mass and asymmetry in the left breast.  The left breast asymmetry resolved.  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%, HER-2 +3+ by IHC, T1BN0 stage Ia clinical stage   03/24/2018: Rt BCS: Grade 2 IDC 1.2 cm  ER 100%, PR 70%, Ki-67 20%, HER-2 +3+ by IHC,  T1BN0 stage Ia Left BCS: DCIS   Treatment plan: 1. Adj chemo with Taxol Herceptin weekly X 11 (stopped for peripheral neuropathy) started 04/22/2018-07/01/2018, now on [name] to complete 1 full year of therapy completed March 2021 2. Adjuvant radiation therapy completed 08/22/2018 3. Adjuvant antiestrogen therapy started 09/05/2018 ------------------------------------------------------------------------------------------------------------------------ Current treatment: Anastrozole 1 mg daily started 09/05/2018 Anastrozole toxicities: Mild hot flashes and muscle stiffness but otherwise doing quite well She is due for another bone density test. Hypertension: Being managed by her PCP.  She is worried about her blood pressure readings. Recommend continuation of antiestrogen therapy for 7 years  Breast cancer surveillance: 1.  Breast exam  04/18/23: Benign 2. Mammograms scheduled for 05/01/2022 3.  Bone density 05/22/2021: T score -1.3: Normal   Return to clinic in 1 year for follow-up  MGUS (monoclonal gammopathy of unknown significance) Hypercalcemia work-up revealed serum protein electrophoresis with 0.3 g of M protein. Kappa: 25.3, kappa lambda ratio 2.94, Calcium 10.7, PTH 26: Normal, PTH RP less than 2, TSH 0.585, vitamin D30.1  Bone survey 04/15/2021: No lytic lesions 03/19/2022: Hemoglobin 14, Kappa 26.8, lambda 10.1, ratio 2.65, M protein 0.2 g, IgG kappa, calcium 10.6, creatinine 0.89, albumin 4.7 04/11/23: Hb  13.4, M-Protein: 0.3 Gm IgG Kappa, Kappa 21.6, Lambda 8.3, Ratio: 2.6, Ca: 10.5, Alb 4.7, Cr: 0.79   Patient has stable MGUS. 1 year lab and follow-up ------------------------------------- Assessment and Plan Assessment & Plan Malignant neoplasm of upper-outer quadrant of right breast, estrogen receptor positive Continued anastrozole for seven years for extended benefit. Tolerating well. - Continue anastrozole for a total of seven years. - Ensure refills are available for anastrozole. - Schedule mammogram in a couple of weeks. - Review mammogram results at next visit.  Monoclonal gammopathy of unknown significance (MGUS) Slight increase in M protein level not concerning. Kappa light chain improved. High calcium likely due to thyroid/parathyroid issues. Normal kidney function. - Recheck labs in one year.  Bone health monitoring Due for bone density scan this year. - Order bone density scan. - Coordinate with breast center to schedule bone density scan, possibly on the same day as the mammogram.      Orders Placed This Encounter  Procedures   DG Bone Density    Standing Status:   Future    Expected Date:   05/02/2023    Expiration Date:   04/17/2024    Reason for Exam (SYMPTOM  OR DIAGNOSIS REQUIRED):   Post menopausal    Preferred imaging [facility]    Release to patient:   Immediate   CBC with Differential (Cancer Center Only)    Standing Status:   Future    Expiration Date:   04/17/2024   CMP (Cancer Center only)    Standing Status:   Future    Expiration Date:   04/17/2024   Multiple Myeloma Panel (SPEP&IFE w/QIG)    Standing Status:   Future    Expiration Date:   04/17/2024   Kappa/lambda light chains    Standing Status:   Future    Expiration Date:   04/17/2024   Beta 2 microglobulin, serum    Standing Status:   Future    Expiration Date:   04/17/2024   The patient has a good understanding of the overall plan. she agrees with it. she will call with any  problems that may develop before the next visit here. Total time spent: 30 mins including face to face time and time spent for planning, charting and  co-ordination of care   Tamsen Meek, MD 04/18/23

## 2023-04-23 ENCOUNTER — Other Ambulatory Visit: Payer: Self-pay

## 2023-05-02 ENCOUNTER — Ambulatory Visit
Admission: RE | Admit: 2023-05-02 | Discharge: 2023-05-02 | Disposition: A | Discharge status: Home / Self Care | Source: Ambulatory Visit | Attending: Internal Medicine | Admitting: Internal Medicine

## 2023-05-02 DIAGNOSIS — Z1231 Encounter for screening mammogram for malignant neoplasm of breast: Secondary | ICD-10-CM

## 2023-05-09 ENCOUNTER — Other Ambulatory Visit: Payer: Self-pay

## 2023-05-10 ENCOUNTER — Other Ambulatory Visit: Payer: Self-pay

## 2023-05-18 LAB — LAB REPORT - SCANNED: Albumin/Creatinine Ratio, Urine, POC: 37

## 2023-05-27 ENCOUNTER — Ambulatory Visit: Attending: Internal Medicine | Admitting: Internal Medicine

## 2023-05-27 ENCOUNTER — Other Ambulatory Visit: Payer: Self-pay

## 2023-05-27 ENCOUNTER — Encounter: Payer: Self-pay | Admitting: Internal Medicine

## 2023-05-27 ENCOUNTER — Ambulatory Visit: Payer: 59 | Admitting: Internal Medicine

## 2023-05-27 VITALS — BP 129/75 | HR 89 | Temp 98.7°F | Resp 16 | Ht 64.0 in | Wt 178.0 lb

## 2023-05-27 DIAGNOSIS — Z7985 Long-term (current) use of injectable non-insulin antidiabetic drugs: Secondary | ICD-10-CM

## 2023-05-27 DIAGNOSIS — Z7984 Long term (current) use of oral hypoglycemic drugs: Secondary | ICD-10-CM | POA: Diagnosis not present

## 2023-05-27 DIAGNOSIS — Z6830 Body mass index (BMI) 30.0-30.9, adult: Secondary | ICD-10-CM

## 2023-05-27 DIAGNOSIS — E1169 Type 2 diabetes mellitus with other specified complication: Secondary | ICD-10-CM

## 2023-05-27 DIAGNOSIS — Z8673 Personal history of transient ischemic attack (TIA), and cerebral infarction without residual deficits: Secondary | ICD-10-CM

## 2023-05-27 DIAGNOSIS — I10 Essential (primary) hypertension: Secondary | ICD-10-CM

## 2023-05-27 DIAGNOSIS — E119 Type 2 diabetes mellitus without complications: Secondary | ICD-10-CM

## 2023-05-27 DIAGNOSIS — D472 Monoclonal gammopathy: Secondary | ICD-10-CM | POA: Diagnosis not present

## 2023-05-27 DIAGNOSIS — E669 Obesity, unspecified: Secondary | ICD-10-CM | POA: Diagnosis not present

## 2023-05-27 DIAGNOSIS — E1159 Type 2 diabetes mellitus with other circulatory complications: Secondary | ICD-10-CM

## 2023-05-27 LAB — POCT GLYCOSYLATED HEMOGLOBIN (HGB A1C): HbA1c, POC (controlled diabetic range): 6.6 % (ref 0.0–7.0)

## 2023-05-27 LAB — GLUCOSE, POCT (MANUAL RESULT ENTRY): POC Glucose: 144 mg/dL — AB (ref 70–99)

## 2023-05-27 MED ORDER — AMLODIPINE BESYLATE 10 MG PO TABS
10.0000 mg | ORAL_TABLET | Freq: Every day | ORAL | 2 refills | Status: AC
  Filled 2023-05-27 – 2023-06-06 (×2): qty 90, 90d supply, fill #0
  Filled 2023-09-02: qty 90, 90d supply, fill #1
  Filled 2023-12-06: qty 90, 90d supply, fill #2

## 2023-05-27 MED ORDER — CLOPIDOGREL BISULFATE 75 MG PO TABS
75.0000 mg | ORAL_TABLET | Freq: Every day | ORAL | 1 refills | Status: DC
  Filled 2023-05-27 – 2023-06-06 (×2): qty 90, 90d supply, fill #0
  Filled 2023-08-29: qty 90, 90d supply, fill #1

## 2023-05-27 MED ORDER — METFORMIN HCL 1000 MG PO TABS
1000.0000 mg | ORAL_TABLET | Freq: Two times a day (BID) | ORAL | 1 refills | Status: DC
  Filled 2023-05-27 – 2023-07-31 (×2): qty 180, 90d supply, fill #0
  Filled 2023-10-28: qty 180, 90d supply, fill #1

## 2023-05-27 MED ORDER — LISINOPRIL 10 MG PO TABS
10.0000 mg | ORAL_TABLET | Freq: Every day | ORAL | 1 refills | Status: DC
  Filled 2023-05-27 – 2023-06-17 (×2): qty 90, 90d supply, fill #0
  Filled 2023-08-29: qty 90, 90d supply, fill #1

## 2023-05-27 NOTE — Patient Instructions (Signed)
 VISIT SUMMARY:  Today, you had a follow-up appointment to review your diabetes, hypertension, and hyperlipidemia management. Your diabetes control has improved, your blood pressure is well-controlled, and your cholesterol levels are within the target range. We also discussed your general health maintenance and scheduled necessary tests and follow-ups.  YOUR PLAN:  -TYPE 2 DIABETES MELLITUS: Type 2 diabetes is a condition where your body does not use insulin  properly, leading to high blood sugar levels. Your A1c has improved to 6.6%, and your blood sugar readings are stable. Continue taking Trulicity  3 mg weekly and metformin  1000 mg twice daily. Keep up with your physical activity, aiming for 150 minutes per week, and maintain a balanced diet with portion control of carbohydrates. A refill for metformin  has been sent to your pharmacy.  -HYPERTENSION: Hypertension, or high blood pressure, is when the force of your blood against your artery walls is too high. Your blood pressure is well-controlled at 129/75 mmHg. Continue taking amlodipine  and lisinopril  as prescribed and adhere to a low-salt diet. Refills for both medications have been sent to your pharmacy.  -You are on Plavix  for history of stroke. Continue this and the cholesterol lowering medication called Rosuvastatin   -MONOCLONAL GAMMOPATHY OF UNDETERMINED SIGNIFICANCE (MGUS): MGUS is a condition where an abnormal protein is in your blood, which can sometimes progress to more serious diseases. Your condition is well-managed, and you are aware of the need for regular monitoring.  -GENERAL HEALTH MAINTENANCE: You are due for an annual urine test for proteinuria as part of your diabetes management. This test helps to check for kidney damage.  INSTRUCTIONS:  Please schedule a follow-up appointment in four months. Refills for your medications have been sent to the pharmacy downstairs. Additionally, perform the annual urine test for proteinuria as  part of your diabetes management.

## 2023-05-27 NOTE — Progress Notes (Signed)
 Patient ID: Sandra Shelton, female    DOB: 1956-01-04  MRN: [id number]  CC: Chronic disease management  Subjective: Sandra Shelton is a 68 y.o. female who presents for chronic ds management. 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  History of Present Illness Sandra Shelton is a 68 year old female with diabetes, hypertension, hx of CVA and hyperlipidemia who presents for a four-month follow-up.  DM:   Results for orders placed or performed in visit on 05/27/23  Glucose (CBG)   Collection Time: 05/27/23  9:58 AM  Result Value Ref Range   POC Glucose 144 (A) 70 - 99 mg/dl  HgB W0J   Collection Time: 05/27/23 10:00 AM  Result Value Ref Range   Hemoglobin A1C     HbA1c POC (<> result, manual entry)     HbA1c, POC (prediabetic range)     HbA1c, POC (controlled diabetic range) 6.6 0.0 - 7.0 %    Her diabetes management shows improvement with a decrease in A1c from 7.4% to 6.6%. Fasting blood glucose is 144 mg/dL in the office and 120 mg/dL at home this a.m. She is on Trulicity  3 mg weekly and metformin  1000 mg twice daily. She experiences a decreased taste for food with age and has no cravings for sweets or junk food.  She does some stretching exercises and goes for walks 2-4 times a week.  Weight is down 2 pounds since last visit  HTN blood pressure is well-controlled at 129/75 mmHg with amlodipine  10 mg and lisinopril  10 mg. She checks her blood pressure intermittently and maintains good control. She limits salt intake and uses a salt substitute.  For hyperlipidemia/hx of CVA, she continues rosuvastatin  daily. Her cholesterol levels were last checked in January and were reported as  good.  MGUS: She follows with Dr. Lee Public and saw him last month.  MGUS with stable.    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 90 tablet 2   anastrozole  (ARIMIDEX ) 1 MG tablet TAKE 1 TABLET BY MOUTH ONCE DAILY 90 tablet 3   aspirin  81 MG tablet Take 1 tablet (81 mg total) by mouth daily. 100 tablet 2   blood glucose meter kit and supplies KIT Dispense based on patient and insurance preference. Use up to four times daily as directed. (FOR ICD-9 250.00, 250.01). 1 each 0   CALCIUM  PO Take 15 mLs by mouth daily.     clopidogrel  (PLAVIX ) 75 MG  tablet Take 1 tablet (75 mg total) by mouth daily. 90 tablet 1   Dulaglutide  (TRULICITY ) 3 MG/0.5ML SOAJ Inject 3 mg as directed once a week. 2 mL 6   Elderberry 575 MG/5ML SYRP Take 15 mLs by mouth daily as needed.      lisinopril  (ZESTRIL ) 10 MG tablet Take 1 tablet (10 mg total) by mouth daily. 90 tablet 1   magnesium  30 MG tablet Take 1 tablet (30 mg total) by mouth daily.     metFORMIN  (GLUCOPHAGE ) 1000 MG tablet Take 1 tablet (1,000 mg total) by mouth 2 (two) times daily with a meal. 180 tablet 1   rosuvastatin  (CRESTOR ) 40 MG tablet Take 1  tablet (40 mg total) by mouth daily. 90 tablet 2   No current facility-administered medications on file prior to visit.    No Known Allergies  Social History   Socioeconomic History   Marital status: Single    Spouse name: Not on file   Number of children: Not on file   Years of education: Not on file   Highest education level: Not on file  Occupational History   Not on file  Tobacco Use   Smoking status: Never   Smokeless tobacco: Never  Vaping Use   Vaping status: Never Used  Substance and Sexual Activity   Alcohol use: No   Drug use: No   Sexual activity: Not on file  Other Topics Concern   Not on file  Social History Narrative   Lives home alone.  Single.  Education Some college.  No children.     Social Drivers of Corporate investment banker Strain: Low Risk  (01/22/2023)   Overall Financial Resource Strain (CARDIA)    Difficulty of Paying Living Expenses: Not hard at all  Food Insecurity: No Food Insecurity (01/22/2023)   Hunger Vital Sign    Worried About Running Out of Food in the Last Year: Never true    Ran Out of Food in the Last Year: Never true  Transportation Needs: No Transportation Needs (01/22/2023)   PRAPARE - Administrator, Civil Service (Medical): No    Lack of Transportation (Non-Medical): No  Physical Activity: Insufficiently Active (01/22/2023)   Exercise Vital Sign    Days of Exercise per Week: 3 days    Minutes of Exercise per Session: 30 min  Stress: No Stress Concern Present (01/22/2023)   [employer] of Occupational Health - Occupational Stress Questionnaire    Feeling of Stress : Not at all  Social Connections: Socially Isolated (01/22/2023)   Social Connection and Isolation Panel [NHANES]    Frequency of Communication with Friends and Family: More than three times a week    Frequency of Social Gatherings with Friends and Family: Three times a week    Attends Religious Services: Never    Active Member of Clubs or  Organizations: No    Attends Banker Meetings: Never    Marital Status: Never married  Intimate Partner Violence: Not At Risk (01/22/2023)   Humiliation, Afraid, Rape, and Kick questionnaire    Fear of Current or Ex-Partner: No    Emotionally Abused: No    Physically Abused: No    Sexually Abused: No    Family History  Problem Relation Age of Onset   Diabetes Mellitus II Sister    Stroke Mother    Diabetes Mellitus II Mother    Prostate cancer Father    Diabetes Mellitus II Brother    Breast  cancer Maternal Aunt    Diabetes Maternal Aunt    Colon cancer Neg Hx    Esophageal cancer Neg Hx    Stomach cancer Neg Hx    Pancreatic cancer Neg Hx    Liver disease Neg Hx     Past Surgical History:  Procedure Laterality Date   BREAST EXCISIONAL BIOPSY Right    BREAST LUMPECTOMY Right 03/2018   BREAST LUMPECTOMY WITH RADIOACTIVE SEED AND SENTINEL LYMPH NODE BIOPSY Right 03/24/2018   Procedure: RIGHT BREAST LUMPECTOMY WITH RADIOACTIVE SEED AND RIGHT AXILLARY DEEP SENTINEL LYMPH NODE BIOPSY INJECT BLUE DYE RIGHT BREAST;  Surgeon: Boyce Byes, MD;  Location: [facility];  Service: General;  Laterality: Right;   COLONOSCOPY     PORT-A-CATH REMOVAL N/A 05/19/2019   Procedure: PORT REMOVAL;  Surgeon: Dareen Ebbing, MD;  Location: [facility] OR;  Service: General;  Laterality: N/A;   PORTACATH PLACEMENT N/A 04/09/2018   Procedure: INSERTION PORT-A-CATH WITH ULTRASOUND;  Surgeon: Boyce Byes, MD;  Location: [facility];  Service: General;  Laterality: N/A;   TRANSCAROTID ARTERY REVASCULARIZATION  Left 07/24/2017   Procedure: TRANSCAROTID ARTERY REVASCULARIZATION, left;  Surgeon: Richrd Char, MD;  Location: [facility];  Service: Vascular;  Laterality: Left;   UTERINE FIBROID SURGERY     2005    ROS: Review of Systems Negative except as stated above  PHYSICAL EXAM: BP 129/75 (BP Location: Left Arm, Patient Position: Sitting, Cuff Size: Normal)   Pulse  89   Temp 98.7 F (37.1 C) (Oral)   Resp 16   Ht 5\' 4"  (1.626 m)   Wt 178 lb (80.7 kg)   LMP  (LMP Unknown)   SpO2 100%   BMI 30.55 kg/m   Wt Readings from Last 3 Encounters:  05/27/23 178 lb (80.7 kg)  04/18/23 175 lb 14.4 oz (79.8 kg)  04/02/23 177 lb (80.3 kg)    Physical Exam  General appearance - alert, well appearing, and in no distress Mental status - normal mood, behavior, speech, dress, motor activity, and thought processes Neck - supple, no significant adenopathy Chest - clear to auscultation, no wheezes, rales or rhonchi, symmetric air entry Heart - normal rate, regular rhythm, normal S1, S2, no murmurs, rubs, clicks or gallops Extremities - peripheral pulses normal, no pedal edema, no clubbing or cyanosis      Latest Ref Rng & Units 04/11/2023   10:20 AM 01/22/2023    2:24 PM 03/19/2022    9:09 AM  CMP  Glucose 70 - 99 mg/dL 409  811  914   BUN 8 - 23 mg/dL 14  17  13    Creatinine 0.44 - 1.00 mg/dL 7.82  9.56  2.13   Sodium 135 - 145 mmol/L 139  141  137   Potassium 3.5 - 5.1 mmol/L 4.1  4.8  4.2   Chloride 98 - 111 mmol/L 107  104  106   CO2 22 - 32 mmol/L 26  22  22    Calcium  8.9 - 10.3 mg/dL 08.6  57.8  46.9   Total Protein 6.5 - 8.1 g/dL 8.1  7.8  8.3   Total Bilirubin 0.0 - 1.2 mg/dL 0.3  <6.2  0.3   Alkaline Phos 38 - 126 U/L 52  71  60   AST 15 - 41 U/L 14  17  18    ALT 0 - 44 U/L 14  23  23     Lipid Panel     Component Value Date/Time  CHOL 143 01/22/2023 1424   TRIG 90 01/22/2023 1424   HDL 45 01/22/2023 1424   CHOLHDL 3.2 01/22/2023 1424   CHOLHDL 8.2 07/09/2017 0646   VLDL 76 (H) 07/09/2017 0646   LDLCALC 81 01/22/2023 1424    CBC    Component Value Date/Time   WBC 6.6 04/11/2023 1020   WBC 6.1 05/19/2019 0730   RBC 4.97 04/11/2023 1020   HGB 13.4 04/11/2023 1020   HGB 13.9 01/22/2023 1424   HCT 40.7 04/11/2023 1020   HCT 43.1 01/22/2023 1424   PLT 313 04/11/2023 1020   PLT 382 01/22/2023 1424   MCV 81.9 04/11/2023 1020   MCV  85 01/22/2023 1424   MCH 27.0 04/11/2023 1020   MCHC 32.9 04/11/2023 1020   RDW 13.0 04/11/2023 1020   RDW 12.6 01/22/2023 1424   LYMPHSABS 2.1 04/11/2023 1020   MONOABS 0.5 04/11/2023 1020   EOSABS 0.1 04/11/2023 1020   BASOSABS 0.0 04/11/2023 1020    ASSESSMENT AND PLAN: 1. Type 2 diabetes mellitus with obesity (HCC) (Primary) At goal - Continue Trulicity  3 mg weekly. - Continue metformin  1000 mg twice daily. - Encourage physical activity aiming for 150 minutes per week. - Advise on balanced diet with portion control of carbohydrates. - Send refill for metformin . - HgB A1c - Glucose (CBG) - metFORMIN  (GLUCOPHAGE ) 1000 MG tablet; Take 1 tablet (1,000 mg total) by mouth 2 (two) times daily with a meal.  Dispense: 180 tablet; Refill: 1  2. Diabetes mellitus treated with oral medication (HCC) See #1 above - Urine Albumin/Creatinine with ratio (send out) [LAB689]  3. Long-term (current) use of injectable non-insulin  antidiabetic drugs See #1 above  4. Hypertension associated with type 2 diabetes mellitus (HCC) At goal. Continue Lisinopril  and Norvasc  - lisinopril  (ZESTRIL ) 10 MG tablet; Take 1 tablet (10 mg total) by mouth daily.  Dispense: 90 tablet; Refill: 1 - amLODipine  (NORVASC ) 10 MG tablet; Take 1 tablet (10 mg total) by mouth daily.  Dispense: 90 tablet; Refill: 2  5. MGUS (monoclonal gammopathy of unknown significance) Aware of potential progression to multiple myeloma and need for regular monitoring.  She follows with oncologist Dr. Gudena.  6. History of CVA (cerebrovascular accident) -Continue Crestor  - clopidogrel  (PLAVIX ) 75 MG tablet; Take 1 tablet (75 mg total) by mouth daily.  Dispense: 90 tablet; Refill: 1   Patient was given the opportunity to ask questions.  Patient verbalized understanding of the plan and was able to repeat key elements of the plan.   This documentation was completed using Paediatric nurse.  Any transcriptional errors  are unintentional.  Orders Placed This Encounter  Procedures   HgB A1c   Glucose (CBG)     Requested Prescriptions   Pending Prescriptions Disp Refills   clopidogrel  (PLAVIX ) 75 MG tablet 90 tablet 1    Sig: Take 1 tablet (75 mg total) by mouth daily.    No follow-ups on file.  Concetta Dee, MD, FACP

## 2023-05-28 LAB — MICROALBUMIN / CREATININE URINE RATIO
Creatinine, Urine: 152.4 mg/dL
Microalb/Creat Ratio: 10 mg/g{creat} (ref 0–29)
Microalbumin, Urine: 15.7 ug/mL

## 2023-05-29 ENCOUNTER — Ambulatory Visit: Payer: Self-pay | Admitting: Internal Medicine

## 2023-06-06 ENCOUNTER — Other Ambulatory Visit: Payer: Self-pay

## 2023-06-07 ENCOUNTER — Other Ambulatory Visit: Payer: Self-pay

## 2023-06-17 ENCOUNTER — Other Ambulatory Visit: Payer: Self-pay

## 2023-06-18 ENCOUNTER — Other Ambulatory Visit: Payer: Self-pay

## 2023-07-05 ENCOUNTER — Other Ambulatory Visit: Payer: Self-pay

## 2023-07-30 ENCOUNTER — Other Ambulatory Visit: Payer: Self-pay

## 2023-07-30 ENCOUNTER — Other Ambulatory Visit: Payer: Self-pay | Admitting: Internal Medicine

## 2023-07-30 DIAGNOSIS — E1159 Type 2 diabetes mellitus with other circulatory complications: Secondary | ICD-10-CM

## 2023-07-30 MED ORDER — TRULICITY 3 MG/0.5ML ~~LOC~~ SOAJ
3.0000 mg | SUBCUTANEOUS | 6 refills | Status: DC
  Filled 2023-07-30: qty 2, 28d supply, fill #0
  Filled 2023-08-29: qty 2, 28d supply, fill #1
  Filled 2023-09-25: qty 2, 28d supply, fill #2
  Filled 2023-10-23: qty 2, 28d supply, fill #3
  Filled 2023-11-19: qty 2, 28d supply, fill #4
  Filled 2023-12-17 (×2): qty 2, 28d supply, fill #5

## 2023-07-31 ENCOUNTER — Other Ambulatory Visit: Payer: Self-pay

## 2023-08-01 ENCOUNTER — Other Ambulatory Visit: Payer: Self-pay

## 2023-08-08 ENCOUNTER — Other Ambulatory Visit: Payer: Self-pay

## 2023-08-27 ENCOUNTER — Ambulatory Visit: Payer: 59 | Attending: Internal Medicine

## 2023-08-27 VITALS — Ht 65.0 in | Wt 178.0 lb

## 2023-08-27 DIAGNOSIS — Z0000 Encounter for general adult medical examination without abnormal findings: Secondary | ICD-10-CM

## 2023-08-27 NOTE — Progress Notes (Signed)
 Because this visit was a virtual/telehealth visit,  certain criteria was not obtained, such a blood pressure, CBG if applicable, and timed get up and go. Any medications not marked as taking were not mentioned during the medication reconciliation part of the visit. Any vitals not documented were not able to be obtained due to this being a telehealth visit or patient was unable to self-report a recent blood pressure reading due to a lack of equipment at home via telehealth. Vitals that have been documented are verbally provided by the patient.   Subjective:   Sandra Shelton is a 68 y.o. who presents for a Medicare Wellness preventive visit.  As a reminder, Annual Wellness Visits don't include a physical exam, and some assessments may be limited, especially if this visit is performed virtually. We may recommend an in-person follow-up visit with your provider if needed.  Visit Complete: Virtual I connected with  [name] on 08/27/23 by a audio enabled telemedicine application and verified that I am speaking with the correct person using two identifiers.  Patient Location: Home  Provider Location: Office/Clinic  I discussed the limitations of evaluation and management by telemedicine. The patient expressed understanding and agreed to proceed.  Vital Signs: Because this visit was a virtual/telehealth visit, some criteria may be missing or patient reported. Any vitals not documented were not able to be obtained and vitals that have been documented are patient reported.  VideoDeclined- This patient declined Librarian, academic. Therefore the visit was completed with audio only.  Persons Participating in Visit: Patient.  AWV Questionnaire: No: Patient Medicare AWV questionnaire was not completed prior to this visit.  Cardiac Risk Factors include: advanced age (>19men, >70 women);sedentary lifestyle;diabetes mellitus;hypertension;dyslipidemia;family history of  premature cardiovascular disease     Objective:    Today's Vitals   08/27/23 0952  Weight: 178 lb (80.7 kg)  Height: 5' 5 (1.651 m)  PainSc: 0-No pain   Body mass index is 29.62 kg/m.     08/27/2023    9:54 AM 08/21/2022    4:25 PM 04/18/2022   11:06 AM 12/01/2021    9:06 AM 11/15/2021   10:11 AM 11/24/2019   10:31 AM 05/19/2019    8:08 AM  Advanced Directives  Does Patient Have a Medical Advance Directive? No No No No No No No  Would patient like information on creating a medical advance directive? No - Patient declined Yes (MAU/Ambulatory/Procedural Areas - Information given) Yes (ED - Information included in AVS) Yes (MAU/Ambulatory/Procedural Areas - Information given) Yes (ED - Information included in AVS) Yes (Inpatient - patient defers creating a medical advance directive at this time - Information given) No - Patient declined    Current Medications (verified) Outpatient Encounter Medications as of 08/27/2023  Medication Sig   amLODipine  (NORVASC ) 10 MG tablet Take 1 tablet (10 mg total) by mouth daily.   anastrozole  (ARIMIDEX ) 1 MG tablet TAKE 1 TABLET BY MOUTH ONCE DAILY   aspirin  81 MG tablet Take 1 tablet (81 mg total) by mouth daily.   blood glucose meter kit and supplies KIT Dispense based on patient and insurance preference. Use up to four times daily as directed. (FOR ICD-9 250.00, 250.01).   CALCIUM  PO Take 15 mLs by mouth daily.   clopidogrel  (PLAVIX ) 75 MG tablet Take 1 tablet (75 mg total) by mouth daily.   Dulaglutide  (TRULICITY ) 3 MG/0.5ML SOAJ Inject 3 mg as directed once a week.   Elderberry 575 MG/5ML SYRP Take 15 mLs by  mouth daily as needed.    lisinopril  (ZESTRIL ) 10 MG tablet Take 1 tablet (10 mg total) by mouth daily.   magnesium  30 MG tablet Take 1 tablet (30 mg total) by mouth daily.   metFORMIN  (GLUCOPHAGE ) 1000 MG tablet Take 1 tablet (1,000 mg total) by mouth 2 (two) times daily with a meal.   rosuvastatin  (CRESTOR ) 40 MG tablet Take 1 tablet (40 mg  total) by mouth daily.   No facility-administered encounter medications on file as of 08/27/2023.    Allergies (verified) Patient has no known allergies.   History: Past Medical History:  Diagnosis Date   Breast cancer (HCC)     IDC - right breast    Diabetes mellitus without complication (HCC)    type 2   Embolic stroke involving carotid artery (HCC)    GERD (gastroesophageal reflux disease)    Heart murmur    no problems with it   Hepatitis    in the 80's, took meds at the time    HLD (hyperlipidemia)    Hypertension    Peripheral vascular disease (HCC)    carotid artery blockage    Personal history of chemotherapy    completed 03/2019   Personal history of radiation therapy    completed   Sleep apnea    Does not use CPAP   Stroke (HCC) 11/20/2017   Vision abnormalities    glasses   Past Surgical History:  Procedure Laterality Date   BREAST EXCISIONAL BIOPSY Right    BREAST LUMPECTOMY Right 03/2018   BREAST LUMPECTOMY WITH RADIOACTIVE SEED AND SENTINEL LYMPH NODE BIOPSY Right 03/24/2018   Procedure: RIGHT BREAST LUMPECTOMY WITH RADIOACTIVE SEED AND RIGHT AXILLARY DEEP SENTINEL LYMPH NODE BIOPSY INJECT BLUE DYE RIGHT BREAST;  Surgeon: Gail Favorite, MD;  Location: [facility];  Service: General;  Laterality: Right;   COLONOSCOPY     PORT-A-CATH REMOVAL N/A 05/19/2019   Procedure: PORT REMOVAL;  Surgeon: Belinda Cough, MD;  Location: [facility] OR;  Service: General;  Laterality: N/A;   PORTACATH PLACEMENT N/A 04/09/2018   Procedure: INSERTION PORT-A-CATH WITH ULTRASOUND;  Surgeon: Gail Favorite, MD;  Location: [facility];  Service: General;  Laterality: N/A;   TRANSCAROTID ARTERY REVASCULARIZATION  Left 07/24/2017   Procedure: TRANSCAROTID ARTERY REVASCULARIZATION, left;  Surgeon: Harvey Carlin BRAVO, MD;  Location: [facility] OR;  Service: Vascular;  Laterality: Left;   UTERINE FIBROID SURGERY     2005   Family History  Problem Relation Age of Onset    Diabetes Mellitus II Sister    Stroke Mother    Diabetes Mellitus II Mother    Prostate cancer Father    Diabetes Mellitus II Brother    Breast cancer Maternal Aunt    Diabetes Maternal Aunt    Colon cancer Neg Hx    Esophageal cancer Neg Hx    Stomach cancer Neg Hx    Pancreatic cancer Neg Hx    Liver disease Neg Hx    Social History   Socioeconomic History   Marital status: Single    Spouse name: Not on file   Number of children: Not on file   Years of education: Not on file   Highest education level: Not on file  Occupational History   Not on file  Tobacco Use   Smoking status: Never   Smokeless tobacco: Never  Vaping Use   Vaping status: Never Used  Substance and Sexual Activity   Alcohol use: No   Drug use: No  Sexual activity: Not on file  Other Topics Concern   Not on file  Social History Narrative   Lives home alone.  Single.  Education Some college.  No children.     Social Drivers of Corporate investment banker Strain: Low Risk  (08/27/2023)   Overall Financial Resource Strain (CARDIA)    Difficulty of Paying Living Expenses: Not hard at all  Food Insecurity: No Food Insecurity (08/27/2023)   Hunger Vital Sign    Worried About Running Out of Food in the Last Year: Never true    Ran Out of Food in the Last Year: Never true  Transportation Needs: No Transportation Needs (08/27/2023)   PRAPARE - Administrator, Civil Service (Medical): No    Lack of Transportation (Non-Medical): No  Physical Activity: Insufficiently Active (08/27/2023)   Exercise Vital Sign    Days of Exercise per Week: 3 days    Minutes of Exercise per Session: 30 min  Stress: No Stress Concern Present (08/27/2023)   [employer] of Occupational Health - Occupational Stress Questionnaire    Feeling of Stress: Not at all  Social Connections: Socially Isolated (08/27/2023)   Social Connection and Isolation Panel    Frequency of Communication with Friends and Family: More  than three times a week    Frequency of Social Gatherings with Friends and Family: Three times a week    Attends Religious Services: Never    Active Member of Clubs or Organizations: No    Attends Banker Meetings: Never    Marital Status: Never married    Tobacco Counseling Counseling given: Not Answered    Clinical Intake:  Pre-visit preparation completed: Yes  Pain : No/denies pain Pain Score: 0-No pain     BMI - recorded: 29.62 Nutritional Status: BMI 25 -29 Overweight Nutritional Risks: None Diabetes: Yes CBG done?: No Did pt. bring in CBG monitor from home?: No  Lab Results  Component Value Date   HGBA1C 6.6 05/27/2023   HGBA1C 7.4 (A) 01/22/2023   HGBA1C 6.9 07/17/2022     How often do you need to have someone help you when you read instructions, pamphlets, or other written materials from your doctor or pharmacy?: 1 - Never  Interpreter Needed?: No  Information entered by :: Marycarmen Hagey N. Ravynn Hogate, LPN.   Activities of Daily Living     08/27/2023    9:57 AM  In your present state of health, do you have any difficulty performing the following activities:  Hearing? 0  Vision? 0  Difficulty concentrating or making decisions? 0  Walking or climbing stairs? 0  Dressing or bathing? 0  Doing errands, shopping? 0  Preparing Food and eating ? N  Using the Toilet? N  In the past six months, have you accidently leaked urine? N  Do you have problems with loss of bowel control? N  Managing your Medications? N  Managing your Finances? N  Housekeeping or managing your Housekeeping? N    Patient Care Team: Vicci Barnie NOVAK, MD as PCP - General (Internal Medicine) Gail Favorite, MD as Consulting Physician (General Surgery) Odean Potts, MD as Consulting Physician (Hematology and Oncology) Shannon Agent, MD as Consulting Physician (Radiation Oncology) Tyree Nanetta SAILOR, RN as Oncology Nurse Navigator Glean, Stephane BROCKS, RN (Inactive) as Oncology  Nurse Navigator Carletha Hussar, OD (Optometry) McDonald, Juliene SAUNDERS, DPM as Consulting Physician (Podiatry) [facility] as Consulting Physician (Ophthalmology)  I have updated your Care Teams any recent Medical  Services you may have received from other providers in the past year.     Assessment:   This is a routine wellness examination for Sandra Shelton.  Hearing/Vision screen Hearing Screening - Comments:: Denies hearing difficulties.  Vision Screening - Comments:: Wears rx glasses - up to date with routine eye exams with [facility]    Goals Addressed             This Visit's Progress    08/27/2023: To stay healthy and keep moving around.         Depression Screen     08/27/2023    9:58 AM 05/27/2023    9:51 AM 01/22/2023    1:37 PM 08/21/2022    4:21 PM 07/17/2022    9:39 AM 03/15/2022   10:21 AM 03/15/2022   10:20 AM  PHQ 2/9 Scores  PHQ - 2 Score 0 0 0 0 0 0 0  PHQ- 9 Score 0 0 0 0 0 0     Fall Risk     08/27/2023    9:54 AM 05/27/2023    9:51 AM 01/22/2023    1:37 PM 08/21/2022    4:24 PM 07/17/2022    9:33 AM  Fall Risk   Falls in the past year? 1 0 1 0 0  Number falls in past yr: 0 0 0 0 0  Injury with Fall? 0 0 1 0 0  Risk for fall due to :  No Fall Risks History of fall(s) No Fall Risks No Fall Risks  Follow up Falls evaluation completed;Education provided Falls evaluation completed Falls evaluation completed Falls prevention discussed;Education provided;Falls evaluation completed     MEDICARE RISK AT HOME:  Medicare Risk at Home Any stairs in or around the home?: No If so, are there any without handrails?: No Home free of loose throw rugs in walkways, pet beds, electrical cords, etc?: Yes Adequate lighting in your home to reduce risk of falls?: Yes Life alert?: Yes (BATHROOM AND BEDROOM) Use of a cane, walker or w/c?: No Grab bars in the bathroom?: Yes Shower chair or bench in shower?: No Elevated toilet seat or a handicapped toilet?:  Yes  TIMED UP AND GO:  Was the test performed?  No  Cognitive Function: Declined/Normal: No cognitive concerns noted by patient or family. Patient alert, oriented, able to answer questions appropriately and recall recent events. No signs of memory loss or confusion.    08/27/2023    9:58 AM 11/24/2019   10:32 AM  MMSE - Mini Mental State Exam  Not completed: Unable to complete   Orientation to time  5  Orientation to Place  5  Registration  3  Attention/ Calculation  5  Recall  3  Language- name 2 objects  2  Language- repeat  1  Language- follow 3 step command  3  Language- read & follow direction  1  Write a sentence  1  Copy design  1  Total score  30        08/27/2023   10:07 AM 08/21/2022    4:25 PM 12/01/2021    9:07 AM  6CIT Screen  What Year? 0 points 0 points 0 points  What month? 0 points 0 points 0 points  What time? 0 points 0 points 0 points  Count back from 20 0 points 0 points 0 points  Months in reverse 0 points 0 points 0 points  Repeat phrase 0 points 0 points 0 points  Total Score 0  points 0 points 0 points    Immunizations Immunization History  Administered Date(s) Administered   Fluad Quad(high Dose 65+) 11/25/2018   Influenza, Seasonal, Injecte, Preservative Fre 01/22/2023   Influenza,inj,Quad PF,6+ Mos 10/30/2019, 11/22/2020, 11/14/2021   Moderna SARS-COV2 Booster Vaccination 05/06/2020   Moderna Sars-Covid-2 Vaccination 03/25/2019, 04/22/2019   PNEUMOCOCCAL CONJUGATE-20 01/22/2023   Pneumococcal Conjugate-13 10/30/2019   Pneumococcal Polysaccharide-23 09/02/2017   Tdap 03/15/2022   Zoster Recombinant(Shingrix) 12/07/2020, 02/06/2021    Screening Tests Health Maintenance  Topic Date Due   COVID-19 Vaccine (3 - Moderna risk series) 06/03/2020   INFLUENZA VACCINE  08/16/2023   OPHTHALMOLOGY EXAM  09/10/2023   HEMOGLOBIN A1C  11/27/2023   FOOT EXAM  01/22/2024   Diabetic kidney evaluation - eGFR measurement  04/10/2024   Diabetic kidney  evaluation - Urine ACR  05/26/2024   Medicare Annual Wellness (AWV)  08/26/2024   MAMMOGRAM  05/01/2025   Colonoscopy  07/20/2029   DTaP/Tdap/Td (2 - Td or Tdap) 03/14/2032   Pneumococcal Vaccine: 50+ Years  Completed   DEXA SCAN  Completed   Hepatitis C Screening  Completed   Zoster Vaccines- Shingrix  Completed   Hepatitis B Vaccines  Aged Out   HPV VACCINES  Aged Out   Meningococcal B Vaccine  Aged Out    Health Maintenance  Health Maintenance Due  Topic Date Due   COVID-19 Vaccine (3 - Moderna risk series) 06/03/2020   INFLUENZA VACCINE  08/16/2023   Health Maintenance Items Addressed: Yes Patient is due for Covid-19 and Flu vaccines.  Additional Screening:  Vision Screening: Recommended annual ophthalmology exams for early detection of glaucoma and other disorders of the eye. Would you like a referral to an eye doctor? No    Dental Screening: Recommended annual dental exams for proper oral hygiene  Community Resource Referral / Chronic Care Management: CRR required this visit?  No   CCM required this visit?  No   Plan:    I have personally reviewed and noted the following in the patient's chart:   Medical and social history Use of alcohol, tobacco or illicit drugs  Current medications and supplements including opioid prescriptions. Patient is not currently taking opioid prescriptions. Functional ability and status Nutritional status Physical activity Advanced directives List of other physicians Hospitalizations, surgeries, and ER visits in previous 12 months Vitals Screenings to include cognitive, depression, and falls Referrals and appointments  In addition, I have reviewed and discussed with patient certain preventive protocols, quality metrics, and best practice recommendations. A written personalized care plan for preventive services as well as general preventive health recommendations were provided to patient.   Roz LOISE Fuller, LPN   [id number]    After Visit Summary: (Declined) Due to this being a telephonic visit, with patients personalized plan was offered to patient but patient Declined AVS at this time   Notes: Nothing significant to report at this time.

## 2023-08-27 NOTE — Progress Notes (Signed)
 Because this visit was a virtual/telehealth visit,  certain criteria was not obtained, such a blood pressure, CBG if applicable, and timed get up and go. Any medications not marked as taking were not mentioned during the medication reconciliation part of the visit. Any vitals not documented were not able to be obtained due to this being a telehealth visit or patient was unable to self-report a recent blood pressure reading due to a lack of equipment at home via telehealth. Vitals that have been documented are verbally provided by the patient.   Subjective:   Sandra Shelton is a 68 y.o. who presents for a Medicare Wellness preventive visit.  As a reminder, Annual Wellness Visits don't include a physical exam, and some assessments may be limited, especially if this visit is performed virtually. We may recommend an in-person follow-up visit with your provider if needed.  Visit Complete: Virtual I connected with  Sandra Shelton on 08/27/23 by a audio enabled telemedicine application and verified that I am speaking with the correct person using two identifiers.  Patient Location: Home  Provider Location: Office/Clinic  I discussed the limitations of evaluation and management by telemedicine. The patient expressed understanding and agreed to proceed.  Vital Signs: Because this visit was a virtual/telehealth visit, some criteria may be missing or patient reported. Any vitals not documented were not able to be obtained and vitals that have been documented are patient reported.  VideoDeclined- This patient declined Librarian, academic. Therefore the visit was completed with audio only.  Persons Participating in Visit: Patient.  AWV Questionnaire: No: Patient Medicare AWV questionnaire was not completed prior to this visit.  Cardiac Risk Factors include: advanced age (>19men, >70 women);sedentary lifestyle;diabetes mellitus;hypertension;dyslipidemia;family history of  premature cardiovascular disease     Objective:    Today's Vitals   08/27/23 0952  Weight: 178 lb (80.7 kg)  Height: 5' 5 (1.651 m)  PainSc: 0-No pain   Body mass index is 29.62 kg/m.     08/27/2023    9:54 AM 08/21/2022    4:25 PM 04/18/2022   11:06 AM 12/01/2021    9:06 AM 11/15/2021   10:11 AM 11/24/2019   10:31 AM 05/19/2019    8:08 AM  Advanced Directives  Does Patient Have a Medical Advance Directive? No No No No No No No  Would patient like information on creating a medical advance directive? No - Patient declined Yes (MAU/Ambulatory/Procedural Areas - Information given) Yes (ED - Information included in AVS) Yes (MAU/Ambulatory/Procedural Areas - Information given) Yes (ED - Information included in AVS) Yes (Inpatient - patient defers creating a medical advance directive at this time - Information given) No - Patient declined    Current Medications (verified) Outpatient Encounter Medications as of 08/27/2023  Medication Sig   amLODipine  (NORVASC ) 10 MG tablet Take 1 tablet (10 mg total) by mouth daily.   anastrozole  (ARIMIDEX ) 1 MG tablet TAKE 1 TABLET BY MOUTH ONCE DAILY   aspirin  81 MG tablet Take 1 tablet (81 mg total) by mouth daily.   blood glucose meter kit and supplies KIT Dispense based on patient and insurance preference. Use up to four times daily as directed. (FOR ICD-9 250.00, 250.01).   CALCIUM  PO Take 15 mLs by mouth daily.   clopidogrel  (PLAVIX ) 75 MG tablet Take 1 tablet (75 mg total) by mouth daily.   Dulaglutide  (TRULICITY ) 3 MG/0.5ML SOAJ Inject 3 mg as directed once a week.   Elderberry 575 MG/5ML SYRP Take 15 mLs by  mouth daily as needed.    lisinopril  (ZESTRIL ) 10 MG tablet Take 1 tablet (10 mg total) by mouth daily.   magnesium  30 MG tablet Take 1 tablet (30 mg total) by mouth daily.   metFORMIN  (GLUCOPHAGE ) 1000 MG tablet Take 1 tablet (1,000 mg total) by mouth 2 (two) times daily with a meal.   rosuvastatin  (CRESTOR ) 40 MG tablet Take 1 tablet (40 mg  total) by mouth daily.   No facility-administered encounter medications on file as of 08/27/2023.    Allergies (verified) Patient has no known allergies.   History: Past Medical History:  Diagnosis Date   Breast cancer (HCC)     IDC - right breast    Diabetes mellitus without complication (HCC)    type 2   Embolic stroke involving carotid artery (HCC)    GERD (gastroesophageal reflux disease)    Heart murmur    no problems with it   Hepatitis    in the 80's, took meds at the time    HLD (hyperlipidemia)    Hypertension    Peripheral vascular disease (HCC)    carotid artery blockage    Personal history of chemotherapy    completed 03/2019   Personal history of radiation therapy    completed   Sleep apnea    Does not use CPAP   Stroke (HCC) 11/20/2017   Vision abnormalities    glasses   Past Surgical History:  Procedure Laterality Date   BREAST EXCISIONAL BIOPSY Right    BREAST LUMPECTOMY Right 03/2018   BREAST LUMPECTOMY WITH RADIOACTIVE SEED AND SENTINEL LYMPH NODE BIOPSY Right 03/24/2018   Procedure: RIGHT BREAST LUMPECTOMY WITH RADIOACTIVE SEED AND RIGHT AXILLARY DEEP SENTINEL LYMPH NODE BIOPSY INJECT BLUE DYE RIGHT BREAST;  Surgeon: Gail Favorite, MD;  Location: [facility];  Service: General;  Laterality: Right;   COLONOSCOPY     PORT-A-CATH REMOVAL N/A 05/19/2019   Procedure: PORT REMOVAL;  Surgeon: Belinda Cough, MD;  Location: [facility] OR;  Service: General;  Laterality: N/A;   PORTACATH PLACEMENT N/A 04/09/2018   Procedure: INSERTION PORT-A-CATH WITH ULTRASOUND;  Surgeon: Gail Favorite, MD;  Location: [facility];  Service: General;  Laterality: N/A;   TRANSCAROTID ARTERY REVASCULARIZATION  Left 07/24/2017   Procedure: TRANSCAROTID ARTERY REVASCULARIZATION, left;  Surgeon: Harvey Carlin BRAVO, MD;  Location[facility] OR;  Service: Vascular;  Laterality: Left;   UTERINE FIBROID SURGERY     2005   Family History  Problem Relation Age of Onset    Diabetes Mellitus II Sister    Stroke Mother    Diabetes Mellitus II Mother    Prostate cancer Father    Diabetes Mellitus II Brother    Breast cancer Maternal Aunt    Diabetes Maternal Aunt    Colon cancer Neg Hx    Esophageal cancer Neg Hx    Stomach cancer Neg Hx    Pancreatic cancer Neg Hx    Liver disease Neg Hx    Social History   Socioeconomic History   Marital status: Single    Spouse name: Not on file   Number of children: Not on file   Years of education: Not on file   Highest education level: Not on file  Occupational History   Not on file  Tobacco Use   Smoking status: Never   Smokeless tobacco: Never  Vaping Use   Vaping status: Never Used  Substance and Sexual Activity   Alcohol use: No   Drug use: No  Sexual activity: Not on file  Other Topics Concern   Not on file  Social History Narrative   Lives home alone.  Single.  Education Some college.  No children.     Social Drivers of Corporate investment banker Strain: Low Risk  (08/27/2023)   Overall Financial Resource Strain (CARDIA)    Difficulty of Paying Living Expenses: Not hard at all  Food Insecurity: No Food Insecurity (08/27/2023)   Hunger Vital Sign    Worried About Running Out of Food in the Last Year: Never true    Ran Out of Food in the Last Year: Never true  Transportation Needs: No Transportation Needs (08/27/2023)   PRAPARE - Administrator, Civil Service (Medical): No    Lack of Transportation (Non-Medical): No  Physical Activity: Insufficiently Active (08/27/2023)   Exercise Vital Sign    Days of Exercise per Week: 3 days    Minutes of Exercise per Session: 30 min  Stress: No Stress Concern Present (08/27/2023)   [employer] of Occupational Health - Occupational Stress Questionnaire    Feeling of Stress: Not at all  Social Connections: Socially Isolated (08/27/2023)   Social Connection and Isolation Panel    Frequency of Communication with Friends and Family: More  than three times a week    Frequency of Social Gatherings with Friends and Family: Three times a week    Attends Religious Services: Never    Active Member of Clubs or Organizations: No    Attends Banker Meetings: Never    Marital Status: Never married    Tobacco Counseling Counseling given: Not Answered    Clinical Intake:  Pre-visit preparation completed: Yes  Pain : No/denies pain Pain Score: 0-No pain     BMI - recorded: 29.62 Nutritional Status: BMI 25 -29 Overweight Nutritional Risks: None Diabetes: Yes CBG done?: No Did pt. bring in CBG monitor from home?: No  Lab Results  Component Value Date   HGBA1C 6.6 05/27/2023   HGBA1C 7.4 (A) 01/22/2023   HGBA1C 6.9 07/17/2022     How often do you need to have someone help you when you read instructions, pamphlets, or other written materials from your doctor or pharmacy?: 1 - Never  Interpreter Needed?: No  Information entered by :: Sandra Hagey N. Ravynn Hogate, LPN.   Activities of Daily Living     08/27/2023    9:57 AM  In your present state of health, do you have any difficulty performing the following activities:  Hearing? 0  Vision? 0  Difficulty concentrating or making decisions? 0  Walking or climbing stairs? 0  Dressing or bathing? 0  Doing errands, shopping? 0  Preparing Food and eating ? N  Using the Toilet? N  In the past six months, have you accidently leaked urine? N  Do you have problems with loss of bowel control? N  Managing your Medications? N  Managing your Finances? N  Housekeeping or managing your Housekeeping? N    Patient Care Team: Vicci Barnie NOVAK, MD as PCP - General (Internal Medicine) Gail Favorite, MD as Consulting Physician (General Surgery) Odean Potts, MD as Consulting Physician (Hematology and Oncology) Shannon Agent, MD as Consulting Physician (Radiation Oncology) Tyree Nanetta SAILOR, RN as Oncology Nurse Navigator Glean, Stephane BROCKS, RN (Inactive) as Oncology  Nurse Navigator Carletha Hussar, OD (Optometry) McDonald, Juliene SAUNDERS, DPM as Consulting Physician (Podiatry) [facility] as Consulting Physician (Ophthalmology)  I have updated your Care Teams any recent Medical  Services you may have received from other providers in the past year.     Assessment:   This is a routine wellness examination for Sandra Shelton.  Hearing/Vision screen Hearing Screening - Comments:: Denies hearing difficulties.  Vision Screening - Comments:: Wears rx glasses - up to date with routine eye exams with [facility]    Goals Addressed             This Visit's Progress    08/27/2023: To stay healthy and keep moving around.         Depression Screen     08/27/2023    9:58 AM 05/27/2023    9:51 AM 01/22/2023    1:37 PM 08/21/2022    4:21 PM 07/17/2022    9:39 AM 03/15/2022   10:21 AM 03/15/2022   10:20 AM  PHQ 2/9 Scores  PHQ - 2 Score 0 0 0 0 0 0 0  PHQ- 9 Score 0 0 0 0 0 0     Fall Risk     08/27/2023    9:54 AM 05/27/2023    9:51 AM 01/22/2023    1:37 PM 08/21/2022    4:24 PM 07/17/2022    9:33 AM  Fall Risk   Falls in the past year? 1 0 1 0 0  Number falls in past yr: 0 0 0 0 0  Injury with Fall? 0 0 1 0 0  Risk for fall due to :  No Fall Risks History of fall(s) No Fall Risks No Fall Risks  Follow up Falls evaluation completed;Education provided Falls evaluation completed Falls evaluation completed Falls prevention discussed;Education provided;Falls evaluation completed     MEDICARE RISK AT HOME:  Medicare Risk at Home Any stairs in or around the home?: No If so, are there any without handrails?: No Home free of loose throw rugs in walkways, pet beds, electrical cords, etc?: Yes Adequate lighting in your home to reduce risk of falls?: Yes Life alert?: Yes (BATHROOM AND BEDROOM) Use of a cane, walker or w/c?: No Grab bars in the bathroom?: Yes Shower chair or bench in shower?: No Elevated toilet seat or a handicapped toilet?:  Yes  TIMED UP AND GO:  Was the test performed?  No  Cognitive Function: Declined/Normal: No cognitive concerns noted by patient or family. Patient alert, oriented, able to answer questions appropriately and recall recent events. No signs of memory loss or confusion.    08/27/2023    9:58 AM 11/24/2019   10:32 AM  MMSE - Mini Mental State Exam  Not completed: Unable to complete   Orientation to time  5  Orientation to Place  5  Registration  3  Attention/ Calculation  5  Recall  3  Language- name 2 objects  2  Language- repeat  1  Language- follow 3 step command  3  Language- read & follow direction  1  Write a sentence  1  Copy design  1  Total score  30        08/27/2023   10:07 AM 08/21/2022    4:25 PM 12/01/2021    9:07 AM  6CIT Screen  What Year? 0 points 0 points 0 points  What month? 0 points 0 points 0 points  What time? 0 points 0 points 0 points  Count back from 20 0 points 0 points 0 points  Months in reverse 0 points 0 points 0 points  Repeat phrase 0 points 0 points 0 points  Total Score 0  points 0 points 0 points    Immunizations Immunization History  Administered Date(s) Administered   Fluad Quad(high Dose 65+) 11/25/2018   Influenza, Seasonal, Injecte, Preservative Fre 01/22/2023   Influenza,inj,Quad PF,6+ Mos 10/30/2019, 11/22/2020, 11/14/2021   Moderna SARS-COV2 Booster Vaccination 05/06/2020   Moderna Sars-Covid-2 Vaccination 03/25/2019, 04/22/2019   PNEUMOCOCCAL CONJUGATE-20 01/22/2023   Pneumococcal Conjugate-13 10/30/2019   Pneumococcal Polysaccharide-23 09/02/2017   Tdap 03/15/2022   Zoster Recombinant(Shingrix) 12/07/2020, 02/06/2021    Screening Tests Health Maintenance  Topic Date Due   COVID-19 Vaccine (3 - Moderna risk series) 06/03/2020   INFLUENZA VACCINE  08/16/2023   OPHTHALMOLOGY EXAM  09/10/2023   HEMOGLOBIN A1C  11/27/2023   FOOT EXAM  01/22/2024   Diabetic kidney evaluation - eGFR measurement  04/10/2024   Diabetic kidney  evaluation - Urine ACR  05/26/2024   Medicare Annual Wellness (AWV)  08/26/2024   MAMMOGRAM  05/01/2025   Colonoscopy  07/20/2029   DTaP/Tdap/Td (2 - Td or Tdap) 03/14/2032   Pneumococcal Vaccine: 50+ Years  Completed   DEXA SCAN  Completed   Hepatitis C Screening  Completed   Zoster Vaccines- Shingrix  Completed   Hepatitis B Vaccines  Aged Out   HPV VACCINES  Aged Out   Meningococcal B Vaccine  Aged Out    Health Maintenance  Health Maintenance Due  Topic Date Due   COVID-19 Vaccine (3 - Moderna risk series) 06/03/2020   INFLUENZA VACCINE  08/16/2023   Health Maintenance Items Addressed: Yes Patient is due for Covid-19 and Flu vaccines.  Additional Screening:  Vision Screening: Recommended annual ophthalmology exams for early detection of glaucoma and other disorders of the eye. Would you like a referral to an eye doctor? No    Dental Screening: Recommended annual dental exams for proper oral hygiene  Community Resource Referral / Chronic Care Management: CRR required this visit?  No   CCM required this visit?  No   Plan:    I have personally reviewed and noted the following in the patient's chart:   Medical and social history Use of alcohol, tobacco or illicit drugs  Current medications and supplements including opioid prescriptions. Patient is not currently taking opioid prescriptions. Functional ability and status Nutritional status Physical activity Advanced directives List of other physicians Hospitalizations, surgeries, and ER visits in previous 12 months Vitals Screenings to include cognitive, depression, and falls Referrals and appointments  In addition, I have reviewed and discussed with patient certain preventive protocols, quality metrics, and best practice recommendations. A written personalized care plan for preventive services as well as general preventive health recommendations were provided to patient.   Roz LOISE Fuller, LPN   [id number]    After Visit Summary: (Declined) Due to this being a telephonic visit, with patients personalized plan was offered to patient but patient Declined AVS at this time   Notes: Nothing significant to report at this time.

## 2023-08-27 NOTE — Patient Instructions (Signed)
 [name] , Thank you for taking time out of your busy schedule to complete your Annual Wellness Visit with me. I enjoyed our conversation and look forward to speaking with you again next year. I, as well as your care team,  appreciate your ongoing commitment to your health goals. Please review the following plan we discussed and let me know if I can assist you in the future. Your Game plan/ To Do List    Referrals: If you haven't heard from the office you've been referred to, please reach out to them at the phone provided.   Follow up Visits: We will see or speak with you next year for your Next Medicare AWV with our clinical staff Have you seen your provider in the last 6 months (3 months if uncontrolled diabetes)? Yes  Clinician Recommendations:  Aim for 30 minutes of exercise or brisk walking, 6-8 glasses of water, and 5 servings of fruits and vegetables each day.       This is a list of the screenings recommended for you:  Health Maintenance  Topic Date Due   COVID-19 Vaccine (3 - Moderna risk series) 06/03/2020   Flu Shot  08/16/2023   Eye exam for diabetics  09/10/2023   Hemoglobin A1C  11/27/2023   Complete foot exam   01/22/2024   Yearly kidney function blood test for diabetes  04/10/2024   Yearly kidney health urinalysis for diabetes  05/26/2024   Medicare Annual Wellness Visit  08/26/2024   Mammogram  05/01/2025   Colon Cancer Screening  07/20/2029   DTaP/Tdap/Td vaccine (2 - Td or Tdap) 03/14/2032   Pneumococcal Vaccine for age over 48  Completed   DEXA scan (bone density measurement)  Completed   Hepatitis C Screening  Completed   Zoster (Shingles) Vaccine  Completed   Hepatitis B Vaccine  Aged Out   HPV Vaccine  Aged Out   Meningitis B Vaccine  Aged Out    Advanced directives: (Declined) Advance directive discussed with you today. Even though you declined this today, please call our office should you change your mind, and we can give you the proper paperwork for you  to fill out. Advance Care Planning is important because it:  [x]  Makes sure you receive the medical care that is consistent with your values, goals, and preferences  [x]  It provides guidance to your family and loved ones and reduces their decisional burden about whether or not they are making the right decisions based on your wishes.  Follow the link provided in your after visit summary or read over the paperwork we have mailed to you to help you started getting your Advance Directives in place. If you need assistance in completing these, please reach out to us  so that we can help you!  See attachments for Preventive Care and Fall Prevention Tips.

## 2023-09-02 ENCOUNTER — Other Ambulatory Visit: Payer: Self-pay

## 2023-09-17 DIAGNOSIS — H25813 Combined forms of age-related cataract, bilateral: Secondary | ICD-10-CM | POA: Diagnosis not present

## 2023-09-17 DIAGNOSIS — E119 Type 2 diabetes mellitus without complications: Secondary | ICD-10-CM | POA: Diagnosis not present

## 2023-09-17 DIAGNOSIS — H35033 Hypertensive retinopathy, bilateral: Secondary | ICD-10-CM | POA: Diagnosis not present

## 2023-09-17 LAB — HM DIABETES EYE EXAM

## 2023-09-25 ENCOUNTER — Ambulatory Visit (HOSPITAL_BASED_OUTPATIENT_CLINIC_OR_DEPARTMENT_OTHER)
Admission: RE | Admit: 2023-09-25 | Discharge: 2023-09-25 | Disposition: A | Discharge status: Home / Self Care | Source: Ambulatory Visit | Attending: Hematology and Oncology | Admitting: Hematology and Oncology

## 2023-09-25 DIAGNOSIS — Z780 Asymptomatic menopausal state: Secondary | ICD-10-CM | POA: Insufficient documentation

## 2023-09-25 DIAGNOSIS — M85852 Other specified disorders of bone density and structure, left thigh: Secondary | ICD-10-CM | POA: Diagnosis not present

## 2023-09-27 ENCOUNTER — Ambulatory Visit: Admitting: Internal Medicine

## 2023-09-27 ENCOUNTER — Other Ambulatory Visit: Payer: Self-pay

## 2023-10-30 ENCOUNTER — Other Ambulatory Visit: Payer: Self-pay

## 2023-11-12 ENCOUNTER — Encounter: Payer: Self-pay | Admitting: Internal Medicine

## 2023-11-12 ENCOUNTER — Other Ambulatory Visit: Payer: Self-pay

## 2023-11-12 ENCOUNTER — Ambulatory Visit: Attending: Internal Medicine | Admitting: Internal Medicine

## 2023-11-12 VITALS — BP 163/70 | HR 94 | Temp 97.9°F | Ht 65.0 in | Wt 176.0 lb

## 2023-11-12 DIAGNOSIS — L84 Corns and callosities: Secondary | ICD-10-CM

## 2023-11-12 DIAGNOSIS — Z23 Encounter for immunization: Secondary | ICD-10-CM | POA: Diagnosis not present

## 2023-11-12 DIAGNOSIS — G8929 Other chronic pain: Secondary | ICD-10-CM

## 2023-11-12 DIAGNOSIS — E1159 Type 2 diabetes mellitus with other circulatory complications: Secondary | ICD-10-CM | POA: Diagnosis not present

## 2023-11-12 DIAGNOSIS — I10 Essential (primary) hypertension: Secondary | ICD-10-CM | POA: Diagnosis not present

## 2023-11-12 DIAGNOSIS — M25561 Pain in right knee: Secondary | ICD-10-CM

## 2023-11-12 DIAGNOSIS — E1169 Type 2 diabetes mellitus with other specified complication: Secondary | ICD-10-CM | POA: Diagnosis not present

## 2023-11-12 DIAGNOSIS — E785 Hyperlipidemia, unspecified: Secondary | ICD-10-CM

## 2023-11-12 DIAGNOSIS — M25562 Pain in left knee: Secondary | ICD-10-CM

## 2023-11-12 DIAGNOSIS — E119 Type 2 diabetes mellitus without complications: Secondary | ICD-10-CM

## 2023-11-12 DIAGNOSIS — Z7985 Long-term (current) use of injectable non-insulin antidiabetic drugs: Secondary | ICD-10-CM

## 2023-11-12 DIAGNOSIS — Z7984 Long term (current) use of oral hypoglycemic drugs: Secondary | ICD-10-CM

## 2023-11-12 LAB — POCT GLYCOSYLATED HEMOGLOBIN (HGB A1C): HbA1c, POC (controlled diabetic range): 6.9 % (ref 0.0–7.0)

## 2023-11-12 LAB — GLUCOSE, POCT (MANUAL RESULT ENTRY): POC Glucose: 158 mg/dL — AB (ref 70–99)

## 2023-11-12 MED ORDER — LISINOPRIL 20 MG PO TABS
20.0000 mg | ORAL_TABLET | Freq: Every day | ORAL | 1 refills | Status: AC
  Filled 2023-11-12: qty 90, 90d supply, fill #0
  Filled 2024-02-03: qty 90, 90d supply, fill #1

## 2023-11-12 MED ORDER — METFORMIN HCL 1000 MG PO TABS
1000.0000 mg | ORAL_TABLET | Freq: Two times a day (BID) | ORAL | 1 refills | Status: AC
  Filled 2023-11-12 – 2024-02-03 (×2): qty 180, 90d supply, fill #0

## 2023-11-12 MED ORDER — ROSUVASTATIN CALCIUM 40 MG PO TABS
40.0000 mg | ORAL_TABLET | Freq: Every day | ORAL | 2 refills | Status: AC
  Filled 2023-11-12 – 2023-12-20 (×2): qty 90, 90d supply, fill #0

## 2023-11-12 NOTE — Patient Instructions (Signed)
  VISIT SUMMARY: Today, you came in for a follow-up on your chronic medical conditions, including diabetes, hypertension, hyperlipidemia, and chronic knee and heel pain. We discussed your current symptoms, medications, and management strategies.  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 You have calluses on your feet, which increase the risk of ulcers. To help manage this, I recommend using Doctor Scholl's heel inserts in both shoes to reduce pressure, wearing supportive tennis shoes when walking, and visiting a podiatrist regularly for callus management.  -HYPERTENSION: Hypertension, or high blood pressure, is a condition where the force of the blood against your artery walls is too high. We are increasing your lisinopril  dosage to 20 mg daily to better control your blood pressure. Please check your blood pressure twice a week, aiming for a goal of 130/80 mmHg or lower.  -BILATERAL KNEE PAIN LIKELY DUE TO OSTEOARTHRITIS: Osteoarthritis is a condition that causes the cartilage in your joints to break down, leading to pain and stiffness. Your knee pain is likely due to this condition. I recommend using over-the-counter extra strength Tylenol  500 mg as needed for pain relief.  -HYPERLIPIDEMIA AND CEREBROVASCULAR ACCIDENT: Hyperlipidemia is a condition where you have high levels of fats in your blood, which can increase the risk of stroke. You are continuing your current medications, rosuvastatin  and clopidogrel , as prescribed to manage this condition.  INSTRUCTIONS: Please follow up with checking your blood pressure twice a week and aim for a goal of 130/80 mmHg or lower. Continue to monitor your blood sugar levels twice daily. Schedule regular visits with a podiatrist for your foot calluses. Use Doctor Scholl's heel inserts and wear  supportive tennis shoes to help manage foot pain. Take over-the-counter extra strength Tylenol  500 mg as needed for knee pain. Your updated lisinopril  prescription has been sent to the pharmacy.                      Contains text generated by [employer]bridge.                                 Contains text generated by [employer]bridge.                          Contains text generated by [employer]bridge.                                 Contains text generated by [employer]bridge.
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
## 2023-11-13 ENCOUNTER — Ambulatory Visit: Payer: Self-pay | Admitting: Internal Medicine

## 2023-11-13 LAB — BASIC METABOLIC PANEL WITH GFR
BUN/Creatinine Ratio: 14 (ref 12–28)
BUN: 11 mg/dL (ref 8–27)
CO2: 22 mmol/L (ref 20–29)
Calcium: 11 mg/dL — ABNORMAL HIGH (ref 8.7–10.3)
Chloride: 103 mmol/L (ref 96–106)
Creatinine, Ser: 0.8 mg/dL (ref 0.57–1.00)
Glucose: 134 mg/dL — ABNORMAL HIGH (ref 70–99)
Potassium: 4.1 mmol/L (ref 3.5–5.2)
Sodium: 143 mmol/L (ref 134–144)
eGFR: 79 mL/min/1.73 (ref >59)

## 2023-11-14 ENCOUNTER — Ambulatory Visit: Attending: Internal Medicine

## 2023-11-18 ENCOUNTER — Telehealth: Payer: Self-pay | Admitting: Internal Medicine

## 2023-11-18 NOTE — Telephone Encounter (Signed)
 Call returned Antoinette regarding a letter sent from the [facility] indicating an abnormal Urine Albumin/Creatinine Ratio (UACR) result dated 05/20/2023. Antoinette stated she is not clinical and therefore unable to disclose specific lab values. Her intent was to confirm that our clinic received the letter. Informed Antoinette that the patient had a UACR drawn at our clinic on 05/27/2023, and those results were within normal limits.  Advised Antoinette that I  would be forwarded this message to the provider  as an FYI.

## 2023-11-18 NOTE — Telephone Encounter (Signed)
 Copied from CRM [id number]. Topic: General - Other >> Nov 18, 2023  2:12 PM Fonda T wrote:  Reason for CRM: Received call from Tedi, with [facility] calls programs, [phone],  requesting to speak directly to office nurse to report an abnormal lab result to clinical staff.   Caller lost, attempted to reach caller back, no response.  Sending clinical CRM to office to return call.  CB# [phone]

## 2023-11-22 ENCOUNTER — Other Ambulatory Visit: Payer: Self-pay

## 2023-11-23 ENCOUNTER — Ambulatory Visit: Payer: Self-pay | Admitting: Internal Medicine

## 2023-11-23 LAB — VITAMIN D 25 HYDROXY (VIT D DEFICIENCY, FRACTURES): Vit D, 25-Hydroxy: 23.3 ng/mL — ABNORMAL LOW (ref 30.0–100.0)

## 2023-11-23 LAB — INTACT PTH (INCLUDES CALCIUM)
Calcium, Serum: 10.2 mg/dL
PTH (Intact Assay): 48 pg/mL

## 2023-11-23 LAB — TSH+T4F+T3FREE
Free T4: 1.15 ng/dL (ref 0.82–1.77)
T3, Free: 3.5 pg/mL (ref 2.0–4.4)
TSH: 0.78 u[IU]/mL (ref 0.450–4.500)

## 2023-12-06 ENCOUNTER — Other Ambulatory Visit: Payer: Self-pay

## 2023-12-06 ENCOUNTER — Encounter: Payer: Self-pay | Admitting: Hematology and Oncology

## 2023-12-06 ENCOUNTER — Other Ambulatory Visit: Payer: Self-pay | Admitting: Internal Medicine

## 2023-12-06 DIAGNOSIS — Z8673 Personal history of transient ischemic attack (TIA), and cerebral infarction without residual deficits: Secondary | ICD-10-CM

## 2023-12-07 MED ORDER — CLOPIDOGREL BISULFATE 75 MG PO TABS
75.0000 mg | ORAL_TABLET | Freq: Every day | ORAL | 3 refills | Status: AC
  Filled 2023-12-07: qty 90, 90d supply, fill #0

## 2023-12-09 ENCOUNTER — Encounter: Payer: Self-pay | Admitting: Hematology and Oncology

## 2023-12-09 ENCOUNTER — Other Ambulatory Visit: Payer: Self-pay

## 2023-12-17 ENCOUNTER — Other Ambulatory Visit: Payer: Self-pay

## 2023-12-17 ENCOUNTER — Encounter: Payer: Self-pay | Admitting: Hematology and Oncology

## 2023-12-18 ENCOUNTER — Other Ambulatory Visit: Payer: Self-pay

## 2023-12-19 ENCOUNTER — Telehealth: Payer: Self-pay

## 2023-12-19 NOTE — Telephone Encounter (Signed)
 Pharmacy Patient Advocate Encounter  Received notification from [employer] that Prior Authorization for TRULICITY  has been APPROVED from 12/17/2023 to 01/14/2025   PA #/Case ID/Reference #: [id number]

## 2023-12-20 ENCOUNTER — Other Ambulatory Visit: Payer: Self-pay

## 2023-12-25 ENCOUNTER — Other Ambulatory Visit (HOSPITAL_BASED_OUTPATIENT_CLINIC_OR_DEPARTMENT_OTHER)

## 2023-12-25 ENCOUNTER — Other Ambulatory Visit

## 2023-12-27 ENCOUNTER — Other Ambulatory Visit: Payer: Self-pay

## 2024-01-20 ENCOUNTER — Other Ambulatory Visit: Payer: Self-pay

## 2024-02-03 ENCOUNTER — Other Ambulatory Visit: Payer: Self-pay

## 2024-02-06 ENCOUNTER — Other Ambulatory Visit: Payer: Self-pay

## 2024-02-14 ENCOUNTER — Telehealth: Payer: Self-pay | Admitting: Internal Medicine

## 2024-02-14 ENCOUNTER — Other Ambulatory Visit: Payer: Self-pay | Admitting: Internal Medicine

## 2024-02-14 ENCOUNTER — Other Ambulatory Visit: Payer: Self-pay

## 2024-02-14 DIAGNOSIS — E1159 Type 2 diabetes mellitus with other circulatory complications: Secondary | ICD-10-CM

## 2024-02-14 MED ORDER — TRULICITY 3 MG/0.5ML ~~LOC~~ SOAJ
3.0000 mg | SUBCUTANEOUS | 6 refills | Status: AC
  Filled 2024-02-14: qty 2, 28d supply, fill #0

## 2024-02-14 NOTE — Telephone Encounter (Unsigned)
 Copied from CRM #[id number]. Topic: Clinical - Medication Refill >> Feb 14, 2024  9:01 AM Jeoffrey H wrote: Medication: Dulaglutide  (TRULICITY ) 3 MG/0.5ML SOAJ  Has the patient contacted their pharmacy? Yes was advised to call clinic (Agent: If no, request that the patient contact the pharmacy for the refill. If patient does not wish to contact the pharmacy document the reason why and proceed with request.) (Agent: If yes, when and what did the pharmacy advise?)  This is the patient's preferred pharmacy:  [facility] [address] [place] [address] Phone: [phone] Fax: [phone]  Is this the correct pharmacy for this prescription? Yes If no, delete pharmacy and type the correct one.   Has the prescription been filled recently? Yes  Is the patient out of the medication? Yes  Has the patient been seen for an appointment in the last year OR does the patient have an upcoming appointment? Yes-11/12/2023  Can we respond through MyChart? No  Agent: Please be advised that Rx refills may take up to 3 business days. We ask that you follow-up with your pharmacy.

## 2024-02-15 ENCOUNTER — Encounter: Payer: Self-pay | Admitting: Hematology and Oncology

## 2024-03-16 ENCOUNTER — Ambulatory Visit: Admitting: Internal Medicine

## 2024-04-17 ENCOUNTER — Other Ambulatory Visit

## 2024-05-04 ENCOUNTER — Ambulatory Visit: Admitting: Hematology and Oncology

## 2024-09-01 ENCOUNTER — Ambulatory Visit
